# Patient Record
Sex: Female | Born: 1945 | Race: White | Hispanic: No | Marital: Married | Smoking: Never smoker
Health system: Southern US, Community
[De-identification: ages and names within clinical notes are randomized; demographics above are authoritative.]

## PROBLEM LIST (undated history)

## (undated) DIAGNOSIS — Z860100 Personal history of colon polyps, unspecified: Secondary | ICD-10-CM

## (undated) DIAGNOSIS — R001 Bradycardia, unspecified: Secondary | ICD-10-CM

## (undated) DIAGNOSIS — M109 Gout, unspecified: Secondary | ICD-10-CM

## (undated) DIAGNOSIS — E785 Hyperlipidemia, unspecified: Secondary | ICD-10-CM

## (undated) DIAGNOSIS — Z1509 Genetic susceptibility to other malignant neoplasm: Secondary | ICD-10-CM

## (undated) DIAGNOSIS — R002 Palpitations: Secondary | ICD-10-CM

## (undated) DIAGNOSIS — I059 Rheumatic mitral valve disease, unspecified: Secondary | ICD-10-CM

## (undated) DIAGNOSIS — I700 Atherosclerosis of aorta: Secondary | ICD-10-CM

## (undated) DIAGNOSIS — F419 Anxiety disorder, unspecified: Secondary | ICD-10-CM

## (undated) DIAGNOSIS — I5189 Other ill-defined heart diseases: Secondary | ICD-10-CM

## (undated) DIAGNOSIS — I340 Nonrheumatic mitral (valve) insufficiency: Secondary | ICD-10-CM

## (undated) DIAGNOSIS — R197 Diarrhea, unspecified: Secondary | ICD-10-CM

## (undated) DIAGNOSIS — I341 Nonrheumatic mitral (valve) prolapse: Secondary | ICD-10-CM

## (undated) DIAGNOSIS — C719 Malignant neoplasm of brain, unspecified: Secondary | ICD-10-CM

## (undated) DIAGNOSIS — IMO0001 Reserved for inherently not codable concepts without codable children: Secondary | ICD-10-CM

## (undated) DIAGNOSIS — Z800 Family history of malignant neoplasm of digestive organs: Secondary | ICD-10-CM

## (undated) DIAGNOSIS — I10 Essential (primary) hypertension: Secondary | ICD-10-CM

## (undated) DIAGNOSIS — R809 Proteinuria, unspecified: Secondary | ICD-10-CM

## (undated) HISTORY — DX: Rheumatic mitral valve disease, unspecified: I05.9

## (undated) HISTORY — DX: Nonrheumatic mitral (valve) prolapse: I34.1

## (undated) HISTORY — DX: Personal history of colonic polyps: Z86.010

## (undated) HISTORY — DX: Hyperlipidemia, unspecified: E78.5

## (undated) HISTORY — DX: Atherosclerosis of aorta: I70.0

## (undated) HISTORY — PX: TUBAL LIGATION: SHX77

## (undated) HISTORY — DX: Reserved for inherently not codable concepts without codable children: IMO0001

## (undated) HISTORY — DX: Palpitations: R00.2

## (undated) HISTORY — DX: Proteinuria, unspecified: R80.9

## (undated) HISTORY — PX: BREAST LUMPECTOMY: SHX2

## (undated) HISTORY — DX: Other ill-defined heart diseases: I51.89

## (undated) HISTORY — DX: Bradycardia, unspecified: R00.1

## (undated) HISTORY — DX: Diarrhea, unspecified: R19.7

## (undated) HISTORY — DX: Essential (primary) hypertension: I10

## (undated) HISTORY — DX: Anxiety disorder, unspecified: F41.9

## (undated) HISTORY — DX: Nonrheumatic mitral (valve) insufficiency: I34.0

## (undated) HISTORY — DX: Personal history of colon polyps, unspecified: Z86.0100

## (undated) HISTORY — PX: ABDOMINAL HYSTERECTOMY: SHX81

## (undated) HISTORY — DX: Family history of malignant neoplasm of digestive organs: Z80.0

---

## 1999-07-05 ENCOUNTER — Ambulatory Visit (HOSPITAL_COMMUNITY): Admission: RE | Admit: 1999-07-05 | Discharge: 1999-07-05 | Payer: Self-pay | Admitting: Gastroenterology

## 2001-02-26 ENCOUNTER — Other Ambulatory Visit: Admission: RE | Admit: 2001-02-26 | Discharge: 2001-02-26 | Payer: Self-pay | Admitting: Obstetrics and Gynecology

## 2001-10-05 ENCOUNTER — Ambulatory Visit (HOSPITAL_COMMUNITY): Admission: RE | Admit: 2001-10-05 | Discharge: 2001-10-05 | Payer: Self-pay | Admitting: *Deleted

## 2004-10-20 ENCOUNTER — Ambulatory Visit (HOSPITAL_COMMUNITY): Admission: RE | Admit: 2004-10-20 | Discharge: 2004-10-20 | Payer: Self-pay | Admitting: General Surgery

## 2005-05-13 ENCOUNTER — Ambulatory Visit: Admission: RE | Admit: 2005-05-13 | Discharge: 2005-05-13 | Payer: Self-pay | Admitting: Obstetrics and Gynecology

## 2007-08-02 HISTORY — PX: HEMORROIDECTOMY: SUR656

## 2011-01-25 ENCOUNTER — Ambulatory Visit
Admission: RE | Admit: 2011-01-25 | Discharge: 2011-01-25 | Disposition: A | Discharge status: Home / Self Care | Payer: Medicare Other | Source: Ambulatory Visit | Attending: Otolaryngology | Admitting: Otolaryngology

## 2011-01-25 ENCOUNTER — Other Ambulatory Visit: Payer: Self-pay | Admitting: Otolaryngology

## 2011-01-25 DIAGNOSIS — M542 Cervicalgia: Secondary | ICD-10-CM

## 2011-05-23 ENCOUNTER — Other Ambulatory Visit: Payer: Self-pay | Admitting: Dermatology

## 2011-08-04 ENCOUNTER — Other Ambulatory Visit: Payer: Self-pay | Admitting: Internal Medicine

## 2011-08-04 DIAGNOSIS — E785 Hyperlipidemia, unspecified: Secondary | ICD-10-CM | POA: Diagnosis not present

## 2011-08-04 DIAGNOSIS — G459 Transient cerebral ischemic attack, unspecified: Secondary | ICD-10-CM

## 2011-08-04 DIAGNOSIS — R030 Elevated blood-pressure reading, without diagnosis of hypertension: Secondary | ICD-10-CM | POA: Diagnosis not present

## 2011-08-09 ENCOUNTER — Ambulatory Visit
Admission: RE | Admit: 2011-08-09 | Discharge: 2011-08-09 | Disposition: A | Discharge status: Home / Self Care | Payer: Medicare Other | Source: Ambulatory Visit | Attending: Internal Medicine | Admitting: Internal Medicine

## 2011-08-09 ENCOUNTER — Other Ambulatory Visit: Payer: Self-pay | Admitting: Internal Medicine

## 2011-08-09 DIAGNOSIS — R51 Headache: Secondary | ICD-10-CM | POA: Diagnosis not present

## 2011-08-09 DIAGNOSIS — G459 Transient cerebral ischemic attack, unspecified: Secondary | ICD-10-CM

## 2011-08-09 DIAGNOSIS — I6529 Occlusion and stenosis of unspecified carotid artery: Secondary | ICD-10-CM | POA: Diagnosis not present

## 2011-08-09 DIAGNOSIS — R4789 Other speech disturbances: Secondary | ICD-10-CM | POA: Diagnosis not present

## 2011-08-11 DIAGNOSIS — E785 Hyperlipidemia, unspecified: Secondary | ICD-10-CM | POA: Diagnosis not present

## 2011-08-11 DIAGNOSIS — G459 Transient cerebral ischemic attack, unspecified: Secondary | ICD-10-CM | POA: Diagnosis not present

## 2011-08-18 DIAGNOSIS — G459 Transient cerebral ischemic attack, unspecified: Secondary | ICD-10-CM | POA: Diagnosis not present

## 2011-08-18 DIAGNOSIS — I10 Essential (primary) hypertension: Secondary | ICD-10-CM | POA: Diagnosis not present

## 2011-08-18 DIAGNOSIS — E785 Hyperlipidemia, unspecified: Secondary | ICD-10-CM | POA: Diagnosis not present

## 2011-09-29 DIAGNOSIS — Z23 Encounter for immunization: Secondary | ICD-10-CM | POA: Diagnosis not present

## 2011-09-29 DIAGNOSIS — E785 Hyperlipidemia, unspecified: Secondary | ICD-10-CM | POA: Diagnosis not present

## 2011-09-29 DIAGNOSIS — R030 Elevated blood-pressure reading, without diagnosis of hypertension: Secondary | ICD-10-CM | POA: Diagnosis not present

## 2011-09-29 DIAGNOSIS — M542 Cervicalgia: Secondary | ICD-10-CM | POA: Diagnosis not present

## 2011-10-24 DIAGNOSIS — M545 Low back pain: Secondary | ICD-10-CM | POA: Diagnosis not present

## 2011-10-24 DIAGNOSIS — M5430 Sciatica, unspecified side: Secondary | ICD-10-CM | POA: Diagnosis not present

## 2011-10-24 DIAGNOSIS — M5030 Other cervical disc degeneration, unspecified cervical region: Secondary | ICD-10-CM | POA: Diagnosis not present

## 2011-10-25 DIAGNOSIS — M542 Cervicalgia: Secondary | ICD-10-CM | POA: Diagnosis not present

## 2011-11-01 DIAGNOSIS — IMO0002 Reserved for concepts with insufficient information to code with codable children: Secondary | ICD-10-CM | POA: Diagnosis not present

## 2011-11-01 DIAGNOSIS — M542 Cervicalgia: Secondary | ICD-10-CM | POA: Diagnosis not present

## 2011-11-03 DIAGNOSIS — IMO0002 Reserved for concepts with insufficient information to code with codable children: Secondary | ICD-10-CM | POA: Diagnosis not present

## 2011-11-03 DIAGNOSIS — M542 Cervicalgia: Secondary | ICD-10-CM | POA: Diagnosis not present

## 2011-11-09 DIAGNOSIS — IMO0002 Reserved for concepts with insufficient information to code with codable children: Secondary | ICD-10-CM | POA: Diagnosis not present

## 2011-11-09 DIAGNOSIS — M542 Cervicalgia: Secondary | ICD-10-CM | POA: Diagnosis not present

## 2011-11-14 DIAGNOSIS — M542 Cervicalgia: Secondary | ICD-10-CM | POA: Diagnosis not present

## 2011-11-14 DIAGNOSIS — M5430 Sciatica, unspecified side: Secondary | ICD-10-CM | POA: Diagnosis not present

## 2011-11-14 DIAGNOSIS — M545 Low back pain: Secondary | ICD-10-CM | POA: Diagnosis not present

## 2011-11-14 DIAGNOSIS — M5030 Other cervical disc degeneration, unspecified cervical region: Secondary | ICD-10-CM | POA: Diagnosis not present

## 2011-11-14 DIAGNOSIS — IMO0002 Reserved for concepts with insufficient information to code with codable children: Secondary | ICD-10-CM | POA: Diagnosis not present

## 2011-11-17 DIAGNOSIS — IMO0002 Reserved for concepts with insufficient information to code with codable children: Secondary | ICD-10-CM | POA: Diagnosis not present

## 2011-11-17 DIAGNOSIS — M542 Cervicalgia: Secondary | ICD-10-CM | POA: Diagnosis not present

## 2011-12-07 ENCOUNTER — Other Ambulatory Visit: Payer: Self-pay | Admitting: Gastroenterology

## 2011-12-07 DIAGNOSIS — D126 Benign neoplasm of colon, unspecified: Secondary | ICD-10-CM | POA: Diagnosis not present

## 2011-12-07 DIAGNOSIS — Z800 Family history of malignant neoplasm of digestive organs: Secondary | ICD-10-CM | POA: Diagnosis not present

## 2011-12-07 DIAGNOSIS — Z1211 Encounter for screening for malignant neoplasm of colon: Secondary | ICD-10-CM | POA: Diagnosis not present

## 2012-02-01 DIAGNOSIS — T300 Burn of unspecified body region, unspecified degree: Secondary | ICD-10-CM | POA: Diagnosis not present

## 2012-02-01 DIAGNOSIS — L253 Unspecified contact dermatitis due to other chemical products: Secondary | ICD-10-CM | POA: Diagnosis not present

## 2012-02-24 DIAGNOSIS — H04129 Dry eye syndrome of unspecified lacrimal gland: Secondary | ICD-10-CM | POA: Diagnosis not present

## 2012-02-24 DIAGNOSIS — H18839 Recurrent erosion of cornea, unspecified eye: Secondary | ICD-10-CM | POA: Diagnosis not present

## 2012-04-20 DIAGNOSIS — Z23 Encounter for immunization: Secondary | ICD-10-CM | POA: Diagnosis not present

## 2012-05-18 DIAGNOSIS — Z1231 Encounter for screening mammogram for malignant neoplasm of breast: Secondary | ICD-10-CM | POA: Diagnosis not present

## 2012-05-23 DIAGNOSIS — C44529 Squamous cell carcinoma of skin of other part of trunk: Secondary | ICD-10-CM | POA: Diagnosis not present

## 2012-05-23 DIAGNOSIS — L819 Disorder of pigmentation, unspecified: Secondary | ICD-10-CM | POA: Diagnosis not present

## 2012-05-23 DIAGNOSIS — D1801 Hemangioma of skin and subcutaneous tissue: Secondary | ICD-10-CM | POA: Diagnosis not present

## 2012-05-23 DIAGNOSIS — L723 Sebaceous cyst: Secondary | ICD-10-CM | POA: Diagnosis not present

## 2012-05-23 DIAGNOSIS — D2370 Other benign neoplasm of skin of unspecified lower limb, including hip: Secondary | ICD-10-CM | POA: Diagnosis not present

## 2012-05-23 DIAGNOSIS — D235 Other benign neoplasm of skin of trunk: Secondary | ICD-10-CM | POA: Diagnosis not present

## 2012-05-23 DIAGNOSIS — L919 Hypertrophic disorder of the skin, unspecified: Secondary | ICD-10-CM | POA: Diagnosis not present

## 2012-05-23 DIAGNOSIS — L821 Other seborrheic keratosis: Secondary | ICD-10-CM | POA: Diagnosis not present

## 2012-05-23 DIAGNOSIS — D485 Neoplasm of uncertain behavior of skin: Secondary | ICD-10-CM | POA: Diagnosis not present

## 2012-05-23 DIAGNOSIS — D239 Other benign neoplasm of skin, unspecified: Secondary | ICD-10-CM | POA: Diagnosis not present

## 2012-05-23 DIAGNOSIS — L909 Atrophic disorder of skin, unspecified: Secondary | ICD-10-CM | POA: Diagnosis not present

## 2012-05-29 DIAGNOSIS — H18839 Recurrent erosion of cornea, unspecified eye: Secondary | ICD-10-CM | POA: Diagnosis not present

## 2012-05-29 DIAGNOSIS — H04129 Dry eye syndrome of unspecified lacrimal gland: Secondary | ICD-10-CM | POA: Diagnosis not present

## 2012-06-07 ENCOUNTER — Other Ambulatory Visit: Payer: Self-pay | Admitting: Dermatology

## 2012-06-07 DIAGNOSIS — C44529 Squamous cell carcinoma of skin of other part of trunk: Secondary | ICD-10-CM | POA: Diagnosis not present

## 2012-08-06 DIAGNOSIS — R030 Elevated blood-pressure reading, without diagnosis of hypertension: Secondary | ICD-10-CM | POA: Diagnosis not present

## 2012-08-06 DIAGNOSIS — K219 Gastro-esophageal reflux disease without esophagitis: Secondary | ICD-10-CM | POA: Diagnosis not present

## 2012-08-06 DIAGNOSIS — E785 Hyperlipidemia, unspecified: Secondary | ICD-10-CM | POA: Diagnosis not present

## 2012-08-06 DIAGNOSIS — R5383 Other fatigue: Secondary | ICD-10-CM | POA: Diagnosis not present

## 2012-08-06 DIAGNOSIS — R5381 Other malaise: Secondary | ICD-10-CM | POA: Diagnosis not present

## 2012-08-13 DIAGNOSIS — M25539 Pain in unspecified wrist: Secondary | ICD-10-CM | POA: Diagnosis not present

## 2012-08-17 DIAGNOSIS — J309 Allergic rhinitis, unspecified: Secondary | ICD-10-CM | POA: Diagnosis not present

## 2012-08-17 DIAGNOSIS — J370 Chronic laryngitis: Secondary | ICD-10-CM | POA: Diagnosis not present

## 2012-09-03 DIAGNOSIS — M25539 Pain in unspecified wrist: Secondary | ICD-10-CM | POA: Diagnosis not present

## 2013-02-11 DIAGNOSIS — M25579 Pain in unspecified ankle and joints of unspecified foot: Secondary | ICD-10-CM | POA: Diagnosis not present

## 2013-02-26 DIAGNOSIS — R5381 Other malaise: Secondary | ICD-10-CM | POA: Diagnosis not present

## 2013-02-26 DIAGNOSIS — R5383 Other fatigue: Secondary | ICD-10-CM | POA: Diagnosis not present

## 2013-03-11 DIAGNOSIS — L659 Nonscarring hair loss, unspecified: Secondary | ICD-10-CM | POA: Diagnosis not present

## 2013-03-11 DIAGNOSIS — R5383 Other fatigue: Secondary | ICD-10-CM | POA: Diagnosis not present

## 2013-03-11 DIAGNOSIS — M129 Arthropathy, unspecified: Secondary | ICD-10-CM | POA: Diagnosis not present

## 2013-03-11 DIAGNOSIS — R5381 Other malaise: Secondary | ICD-10-CM | POA: Diagnosis not present

## 2013-03-11 DIAGNOSIS — E669 Obesity, unspecified: Secondary | ICD-10-CM | POA: Diagnosis not present

## 2013-03-11 DIAGNOSIS — Z8349 Family history of other endocrine, nutritional and metabolic diseases: Secondary | ICD-10-CM | POA: Diagnosis not present

## 2013-03-11 DIAGNOSIS — R635 Abnormal weight gain: Secondary | ICD-10-CM | POA: Diagnosis not present

## 2013-03-14 DIAGNOSIS — R635 Abnormal weight gain: Secondary | ICD-10-CM | POA: Diagnosis not present

## 2013-04-30 DIAGNOSIS — Z23 Encounter for immunization: Secondary | ICD-10-CM | POA: Diagnosis not present

## 2013-05-20 DIAGNOSIS — Z1231 Encounter for screening mammogram for malignant neoplasm of breast: Secondary | ICD-10-CM | POA: Diagnosis not present

## 2013-05-22 DIAGNOSIS — M25539 Pain in unspecified wrist: Secondary | ICD-10-CM | POA: Diagnosis not present

## 2013-05-22 DIAGNOSIS — M25579 Pain in unspecified ankle and joints of unspecified foot: Secondary | ICD-10-CM | POA: Diagnosis not present

## 2013-05-28 DIAGNOSIS — Z8349 Family history of other endocrine, nutritional and metabolic diseases: Secondary | ICD-10-CM | POA: Diagnosis not present

## 2013-05-28 DIAGNOSIS — E669 Obesity, unspecified: Secondary | ICD-10-CM | POA: Diagnosis not present

## 2013-05-28 DIAGNOSIS — R635 Abnormal weight gain: Secondary | ICD-10-CM | POA: Diagnosis not present

## 2013-06-03 DIAGNOSIS — H52209 Unspecified astigmatism, unspecified eye: Secondary | ICD-10-CM | POA: Diagnosis not present

## 2013-06-03 DIAGNOSIS — H18839 Recurrent erosion of cornea, unspecified eye: Secondary | ICD-10-CM | POA: Diagnosis not present

## 2013-06-03 DIAGNOSIS — H35419 Lattice degeneration of retina, unspecified eye: Secondary | ICD-10-CM | POA: Diagnosis not present

## 2013-06-03 DIAGNOSIS — H2510 Age-related nuclear cataract, unspecified eye: Secondary | ICD-10-CM | POA: Diagnosis not present

## 2013-06-10 ENCOUNTER — Ambulatory Visit (INDEPENDENT_AMBULATORY_CARE_PROVIDER_SITE_OTHER): Payer: Medicare Other

## 2013-06-10 ENCOUNTER — Encounter: Payer: Self-pay | Admitting: Podiatry

## 2013-06-10 ENCOUNTER — Other Ambulatory Visit: Payer: Self-pay | Admitting: Podiatry

## 2013-06-10 ENCOUNTER — Ambulatory Visit (INDEPENDENT_AMBULATORY_CARE_PROVIDER_SITE_OTHER): Payer: Medicare Other | Admitting: Podiatry

## 2013-06-10 VITALS — BP 147/68 | HR 76 | Resp 16 | Ht 63.0 in | Wt 175.0 lb

## 2013-06-10 DIAGNOSIS — M79609 Pain in unspecified limb: Secondary | ICD-10-CM

## 2013-06-10 DIAGNOSIS — M779 Enthesopathy, unspecified: Secondary | ICD-10-CM | POA: Diagnosis not present

## 2013-06-10 DIAGNOSIS — M109 Gout, unspecified: Secondary | ICD-10-CM | POA: Diagnosis not present

## 2013-06-10 LAB — CBC WITH DIFFERENTIAL/PLATELET
Hemoglobin: 12.6 g/dL (ref 12.0–15.0)
Lymphocytes Relative: 22 % (ref 12–46)
Lymphs Abs: 2.1 10*3/uL (ref 0.7–4.0)
Monocytes Relative: 10 % (ref 3–12)
Neutro Abs: 6.4 10*3/uL (ref 1.7–7.7)
Neutrophils Relative %: 63 % (ref 43–77)
Platelets: 295 10*3/uL (ref 150–400)
RBC: 4.11 MIL/uL (ref 3.87–5.11)
WBC: 9.9 10*3/uL (ref 4.0–10.5)

## 2013-06-10 LAB — SEDIMENTATION RATE: Sed Rate: 40 mm/hr — ABNORMAL HIGH (ref 0–22)

## 2013-06-10 MED ORDER — COLCHICINE 0.6 MG PO TABS: 0.6000 mg | ORAL_TABLET | Freq: Every day | ORAL | Status: DC

## 2013-06-10 MED ORDER — HYDROCODONE-ACETAMINOPHEN 10-325 MG PO TABS: 1.0000 | ORAL_TABLET | Freq: Three times a day (TID) | ORAL | Status: DC | PRN

## 2013-06-10 NOTE — Patient Instructions (Signed)
Stop indomethacin and ibuprofen and use new medication. Go to [facility] lab today

## 2013-06-10 NOTE — Progress Notes (Signed)
  Subjective:    Patient ID: Crystal Parsons, female    DOB: 1945-08-28, 67 y.o.   MRN: [id number] "My left foot is swollen and red.  Feels like there's a pad underneath it."  Foot Pain This is a new problem. The current episode started more than 1 month ago. The problem occurs constantly. The problem has been gradually worsening. Associated symptoms include joint swelling. The symptoms are aggravated by standing and walking. She has tried NSAIDs and heat for the symptoms. The treatment provided mild relief.   She is just indomethacin 50 mg 3 times a day for approximately 3 weeks with minimal reduction of symptoms in the left foot with improving symptoms in the right foot. Today she took 1000 mg of ibuprofen for pain relief.   Review of Systems  Constitutional: Negative.   HENT: Negative.   Cardiovascular: Negative.   Gastrointestinal: Negative.   Endocrine: Negative.   Genitourinary: Negative.   Musculoskeletal: Positive for gait problem and joint swelling.  Skin: Negative.   Allergic/Immunologic: Negative.   Neurological: Negative.   Hematological: Negative.        Objective:   Physical Exam  Orientated x75 67 year old white female  Vascular: DP and PT pulses are two over four bilaterally.  Neurological: Knee and ankle reflexes are equal and reactive bilaterally  Dermatological low-grade edema and erythema noted in the left foot. Erythema seems to be localized to the medial left heel area and first MPJ area.  Musculoskeletal: No deformities noted. Tenderness to palpation medial first MPJ left without a palpable lesions.        Assessment & Plan:   Assessment: Suspect gouty arthritis left foot  Plan: Patient is referred to lab for CBC sedimentation rate C-reactive protein and rheumatoid factor an ASO titer. Rx colchicine 0.6 mg #30 take 1 daily. Hydrocodone 10 mg/325 #20 take one every 6 hours as needed for pain. DC indomethacin.  Reappoint x7 days  Audreena Sachdeva  C.Leeanne Deed, DPM

## 2013-06-11 LAB — URIC ACID: Uric Acid, Serum: 5.8 mg/dL (ref 2.4–7.0)

## 2013-06-11 LAB — ANA: Anti Nuclear Antibody(ANA): NEGATIVE

## 2013-06-11 LAB — RHEUMATOID FACTOR: Rhuematoid fact SerPl-aCnc: <10 IU/mL (ref <=14)

## 2013-06-17 ENCOUNTER — Ambulatory Visit (INDEPENDENT_AMBULATORY_CARE_PROVIDER_SITE_OTHER): Payer: Medicare Other | Admitting: Podiatry

## 2013-06-17 ENCOUNTER — Encounter: Payer: Self-pay | Admitting: Podiatry

## 2013-06-17 VITALS — BP 144/78 | HR 76 | Resp 16

## 2013-06-17 DIAGNOSIS — M779 Enthesopathy, unspecified: Secondary | ICD-10-CM

## 2013-06-17 NOTE — Progress Notes (Signed)
Patient ID: MAELEE HOOT, female   DOB: [birth year], 67 y.o.   MRN: [id number]  Subjective: Patient presents for followup care for painful swollen left great toe joint. On the initial visit coexisting 68 mg was prescribed daily. Since she started this medicine the pain and swelling has significantly reduced. In addition she complains of some intermittent leg cramping and has discontinued her Crestor at the recommendation of her pharmacist. The leg spasms and pain still seem to occur somewhat intermittently.  Objective: 67 year old white female orientated x3. The left foot demonstrates mild edema no erythema no warmth. The left first MPJ is not tender to motion or palpation. Lab results of arthritis for a panel demonstrated a normal serum uric acid, slightly elevated C-reactive protein and slightly elevated sedimentation rate. The ASO titer was negative.  Assessment: Probable gouty arthritis left first MPJ  Plan: Patient will continue taking her colchicine one daily until pain is resolved. She will then restart her Crestor and see if her leg symptoms exacerbate. Return at patient's request. If symptoms would recur and could not lab prove that she definitely has gout I would consider referring her to a rheumatologist.

## 2013-06-17 NOTE — Patient Instructions (Signed)
Continue to take 1 Colyrs daily until foot pain resolves. Consult with your primary physician before you restart the Crestor. Tester gout was within normal limits. Rheumatoid arthritis was within normal limits. The chest for ongoing indices was within normal limits. There was mild elevation of the sedimentation rate and C. reactive protein which are nonspecific has for inflammation. Reappoint if needed.

## 2013-06-24 ENCOUNTER — Ambulatory Visit: Payer: Self-pay | Admitting: Podiatry

## 2013-07-29 ENCOUNTER — Other Ambulatory Visit: Payer: Self-pay | Admitting: Dermatology

## 2013-07-29 DIAGNOSIS — D2360 Other benign neoplasm of skin of unspecified upper limb, including shoulder: Secondary | ICD-10-CM | POA: Diagnosis not present

## 2013-07-29 DIAGNOSIS — L821 Other seborrheic keratosis: Secondary | ICD-10-CM | POA: Diagnosis not present

## 2013-07-29 DIAGNOSIS — D239 Other benign neoplasm of skin, unspecified: Secondary | ICD-10-CM | POA: Diagnosis not present

## 2013-07-29 DIAGNOSIS — L739 Follicular disorder, unspecified: Secondary | ICD-10-CM | POA: Diagnosis not present

## 2013-07-29 DIAGNOSIS — Z85828 Personal history of other malignant neoplasm of skin: Secondary | ICD-10-CM | POA: Diagnosis not present

## 2013-07-29 DIAGNOSIS — D485 Neoplasm of uncertain behavior of skin: Secondary | ICD-10-CM | POA: Diagnosis not present

## 2013-07-29 DIAGNOSIS — L570 Actinic keratosis: Secondary | ICD-10-CM | POA: Diagnosis not present

## 2013-07-29 DIAGNOSIS — L909 Atrophic disorder of skin, unspecified: Secondary | ICD-10-CM | POA: Diagnosis not present

## 2013-08-08 DIAGNOSIS — F411 Generalized anxiety disorder: Secondary | ICD-10-CM | POA: Diagnosis not present

## 2013-08-15 DIAGNOSIS — F411 Generalized anxiety disorder: Secondary | ICD-10-CM | POA: Diagnosis not present

## 2013-08-22 DIAGNOSIS — R209 Unspecified disturbances of skin sensation: Secondary | ICD-10-CM | POA: Diagnosis not present

## 2013-08-22 DIAGNOSIS — I10 Essential (primary) hypertension: Secondary | ICD-10-CM | POA: Diagnosis not present

## 2013-08-22 DIAGNOSIS — E785 Hyperlipidemia, unspecified: Secondary | ICD-10-CM | POA: Diagnosis not present

## 2013-08-22 DIAGNOSIS — M19079 Primary osteoarthritis, unspecified ankle and foot: Secondary | ICD-10-CM | POA: Diagnosis not present

## 2013-08-27 DIAGNOSIS — F411 Generalized anxiety disorder: Secondary | ICD-10-CM | POA: Diagnosis not present

## 2013-09-03 DIAGNOSIS — F411 Generalized anxiety disorder: Secondary | ICD-10-CM | POA: Diagnosis not present

## 2013-09-10 DIAGNOSIS — F411 Generalized anxiety disorder: Secondary | ICD-10-CM | POA: Diagnosis not present

## 2013-09-18 DIAGNOSIS — F411 Generalized anxiety disorder: Secondary | ICD-10-CM | POA: Diagnosis not present

## 2013-09-19 DIAGNOSIS — I10 Essential (primary) hypertension: Secondary | ICD-10-CM | POA: Diagnosis not present

## 2013-09-19 DIAGNOSIS — J310 Chronic rhinitis: Secondary | ICD-10-CM | POA: Diagnosis not present

## 2013-09-19 DIAGNOSIS — F411 Generalized anxiety disorder: Secondary | ICD-10-CM | POA: Diagnosis not present

## 2013-09-19 DIAGNOSIS — E785 Hyperlipidemia, unspecified: Secondary | ICD-10-CM | POA: Diagnosis not present

## 2013-09-24 DIAGNOSIS — F411 Generalized anxiety disorder: Secondary | ICD-10-CM | POA: Diagnosis not present

## 2013-10-01 DIAGNOSIS — F411 Generalized anxiety disorder: Secondary | ICD-10-CM | POA: Diagnosis not present

## 2013-10-22 DIAGNOSIS — F411 Generalized anxiety disorder: Secondary | ICD-10-CM | POA: Diagnosis not present

## 2013-10-22 DIAGNOSIS — R05 Cough: Secondary | ICD-10-CM | POA: Diagnosis not present

## 2013-10-22 DIAGNOSIS — R059 Cough, unspecified: Secondary | ICD-10-CM | POA: Diagnosis not present

## 2013-10-22 DIAGNOSIS — I10 Essential (primary) hypertension: Secondary | ICD-10-CM | POA: Diagnosis not present

## 2013-10-29 DIAGNOSIS — F411 Generalized anxiety disorder: Secondary | ICD-10-CM | POA: Diagnosis not present

## 2013-11-05 DIAGNOSIS — F411 Generalized anxiety disorder: Secondary | ICD-10-CM | POA: Diagnosis not present

## 2013-11-18 DIAGNOSIS — F411 Generalized anxiety disorder: Secondary | ICD-10-CM | POA: Diagnosis not present

## 2013-11-25 DIAGNOSIS — F411 Generalized anxiety disorder: Secondary | ICD-10-CM | POA: Diagnosis not present

## 2013-11-28 DIAGNOSIS — R059 Cough, unspecified: Secondary | ICD-10-CM | POA: Diagnosis not present

## 2013-11-28 DIAGNOSIS — R05 Cough: Secondary | ICD-10-CM | POA: Diagnosis not present

## 2013-12-03 DIAGNOSIS — F411 Generalized anxiety disorder: Secondary | ICD-10-CM | POA: Diagnosis not present

## 2013-12-16 DIAGNOSIS — F411 Generalized anxiety disorder: Secondary | ICD-10-CM | POA: Diagnosis not present

## 2014-01-01 DIAGNOSIS — F411 Generalized anxiety disorder: Secondary | ICD-10-CM | POA: Diagnosis not present

## 2014-01-15 DIAGNOSIS — F411 Generalized anxiety disorder: Secondary | ICD-10-CM | POA: Diagnosis not present

## 2014-01-16 DIAGNOSIS — Z800 Family history of malignant neoplasm of digestive organs: Secondary | ICD-10-CM | POA: Diagnosis not present

## 2014-01-16 DIAGNOSIS — Z1211 Encounter for screening for malignant neoplasm of colon: Secondary | ICD-10-CM | POA: Diagnosis not present

## 2014-01-16 DIAGNOSIS — K5730 Diverticulosis of large intestine without perforation or abscess without bleeding: Secondary | ICD-10-CM | POA: Diagnosis not present

## 2014-01-22 ENCOUNTER — Encounter: Payer: Self-pay | Admitting: *Deleted

## 2014-02-05 DIAGNOSIS — F411 Generalized anxiety disorder: Secondary | ICD-10-CM | POA: Diagnosis not present

## 2014-02-07 DIAGNOSIS — R300 Dysuria: Secondary | ICD-10-CM | POA: Diagnosis not present

## 2014-02-20 DIAGNOSIS — R3129 Other microscopic hematuria: Secondary | ICD-10-CM | POA: Diagnosis not present

## 2014-02-20 DIAGNOSIS — Z0000 Encounter for general adult medical examination without abnormal findings: Secondary | ICD-10-CM | POA: Diagnosis not present

## 2014-02-20 DIAGNOSIS — Z1331 Encounter for screening for depression: Secondary | ICD-10-CM | POA: Diagnosis not present

## 2014-02-20 DIAGNOSIS — Z23 Encounter for immunization: Secondary | ICD-10-CM | POA: Diagnosis not present

## 2014-02-20 DIAGNOSIS — I10 Essential (primary) hypertension: Secondary | ICD-10-CM | POA: Diagnosis not present

## 2014-02-24 DIAGNOSIS — F411 Generalized anxiety disorder: Secondary | ICD-10-CM | POA: Diagnosis not present

## 2014-03-13 DIAGNOSIS — F411 Generalized anxiety disorder: Secondary | ICD-10-CM | POA: Diagnosis not present

## 2014-04-10 DIAGNOSIS — F411 Generalized anxiety disorder: Secondary | ICD-10-CM | POA: Diagnosis not present

## 2014-04-29 DIAGNOSIS — Z23 Encounter for immunization: Secondary | ICD-10-CM | POA: Diagnosis not present

## 2014-05-08 DIAGNOSIS — F411 Generalized anxiety disorder: Secondary | ICD-10-CM | POA: Diagnosis not present

## 2014-05-14 DIAGNOSIS — M79671 Pain in right foot: Secondary | ICD-10-CM | POA: Diagnosis not present

## 2014-05-21 DIAGNOSIS — Z1231 Encounter for screening mammogram for malignant neoplasm of breast: Secondary | ICD-10-CM | POA: Diagnosis not present

## 2014-05-22 DIAGNOSIS — R921 Mammographic calcification found on diagnostic imaging of breast: Secondary | ICD-10-CM | POA: Diagnosis not present

## 2014-06-04 DIAGNOSIS — H5203 Hypermetropia, bilateral: Secondary | ICD-10-CM | POA: Diagnosis not present

## 2014-06-04 DIAGNOSIS — D3131 Benign neoplasm of right choroid: Secondary | ICD-10-CM | POA: Diagnosis not present

## 2014-06-04 DIAGNOSIS — H35413 Lattice degeneration of retina, bilateral: Secondary | ICD-10-CM | POA: Diagnosis not present

## 2014-07-09 DIAGNOSIS — F411 Generalized anxiety disorder: Secondary | ICD-10-CM | POA: Diagnosis not present

## 2014-09-03 DIAGNOSIS — R312 Other microscopic hematuria: Secondary | ICD-10-CM | POA: Diagnosis not present

## 2014-09-03 DIAGNOSIS — E669 Obesity, unspecified: Secondary | ICD-10-CM | POA: Diagnosis not present

## 2014-09-03 DIAGNOSIS — I10 Essential (primary) hypertension: Secondary | ICD-10-CM | POA: Diagnosis not present

## 2014-09-03 DIAGNOSIS — N200 Calculus of kidney: Secondary | ICD-10-CM | POA: Diagnosis not present

## 2014-09-03 DIAGNOSIS — Z6832 Body mass index (BMI) 32.0-32.9, adult: Secondary | ICD-10-CM | POA: Diagnosis not present

## 2014-09-03 DIAGNOSIS — E782 Mixed hyperlipidemia: Secondary | ICD-10-CM | POA: Diagnosis not present

## 2014-09-19 DIAGNOSIS — L218 Other seborrheic dermatitis: Secondary | ICD-10-CM | POA: Diagnosis not present

## 2014-09-19 DIAGNOSIS — L309 Dermatitis, unspecified: Secondary | ICD-10-CM | POA: Diagnosis not present

## 2014-09-19 DIAGNOSIS — L821 Other seborrheic keratosis: Secondary | ICD-10-CM | POA: Diagnosis not present

## 2014-09-19 DIAGNOSIS — Z85828 Personal history of other malignant neoplasm of skin: Secondary | ICD-10-CM | POA: Diagnosis not present

## 2014-09-19 DIAGNOSIS — D225 Melanocytic nevi of trunk: Secondary | ICD-10-CM | POA: Diagnosis not present

## 2014-09-19 DIAGNOSIS — D2239 Melanocytic nevi of other parts of face: Secondary | ICD-10-CM | POA: Diagnosis not present

## 2014-09-30 DIAGNOSIS — R300 Dysuria: Secondary | ICD-10-CM | POA: Diagnosis not present

## 2014-10-20 DIAGNOSIS — M25561 Pain in right knee: Secondary | ICD-10-CM | POA: Diagnosis not present

## 2014-11-17 DIAGNOSIS — M25561 Pain in right knee: Secondary | ICD-10-CM | POA: Diagnosis not present

## 2014-11-18 DIAGNOSIS — F411 Generalized anxiety disorder: Secondary | ICD-10-CM | POA: Diagnosis not present

## 2014-11-19 ENCOUNTER — Other Ambulatory Visit: Payer: Self-pay | Admitting: Orthopaedic Surgery

## 2014-11-19 DIAGNOSIS — M25561 Pain in right knee: Secondary | ICD-10-CM

## 2014-12-02 ENCOUNTER — Ambulatory Visit
Admission: RE | Admit: 2014-12-02 | Discharge: 2014-12-02 | Disposition: A | Discharge status: Home / Self Care | Payer: Medicare Other | Source: Ambulatory Visit | Attending: Orthopaedic Surgery | Admitting: Orthopaedic Surgery

## 2014-12-02 DIAGNOSIS — M25561 Pain in right knee: Secondary | ICD-10-CM

## 2014-12-02 DIAGNOSIS — M25461 Effusion, right knee: Secondary | ICD-10-CM | POA: Diagnosis not present

## 2014-12-04 ENCOUNTER — Other Ambulatory Visit: Payer: Medicare Other

## 2014-12-15 DIAGNOSIS — M1711 Unilateral primary osteoarthritis, right knee: Secondary | ICD-10-CM | POA: Diagnosis not present

## 2014-12-16 ENCOUNTER — Other Ambulatory Visit: Payer: Medicare Other

## 2014-12-25 DIAGNOSIS — M1711 Unilateral primary osteoarthritis, right knee: Secondary | ICD-10-CM | POA: Diagnosis not present

## 2014-12-30 DIAGNOSIS — H25013 Cortical age-related cataract, bilateral: Secondary | ICD-10-CM | POA: Diagnosis not present

## 2014-12-30 DIAGNOSIS — H35413 Lattice degeneration of retina, bilateral: Secondary | ICD-10-CM | POA: Diagnosis not present

## 2014-12-30 DIAGNOSIS — H35371 Puckering of macula, right eye: Secondary | ICD-10-CM | POA: Diagnosis not present

## 2015-01-28 DIAGNOSIS — F411 Generalized anxiety disorder: Secondary | ICD-10-CM | POA: Diagnosis not present

## 2015-02-04 DIAGNOSIS — L920 Granuloma annulare: Secondary | ICD-10-CM | POA: Diagnosis not present

## 2015-02-04 DIAGNOSIS — Z85828 Personal history of other malignant neoplasm of skin: Secondary | ICD-10-CM | POA: Diagnosis not present

## 2015-02-18 DIAGNOSIS — F411 Generalized anxiety disorder: Secondary | ICD-10-CM | POA: Diagnosis not present

## 2015-02-19 DIAGNOSIS — F411 Generalized anxiety disorder: Secondary | ICD-10-CM | POA: Diagnosis not present

## 2015-03-02 DIAGNOSIS — F411 Generalized anxiety disorder: Secondary | ICD-10-CM | POA: Diagnosis not present

## 2015-03-11 DIAGNOSIS — E669 Obesity, unspecified: Secondary | ICD-10-CM | POA: Diagnosis not present

## 2015-03-11 DIAGNOSIS — E782 Mixed hyperlipidemia: Secondary | ICD-10-CM | POA: Diagnosis not present

## 2015-03-11 DIAGNOSIS — I10 Essential (primary) hypertension: Secondary | ICD-10-CM | POA: Diagnosis not present

## 2015-03-11 DIAGNOSIS — Z6832 Body mass index (BMI) 32.0-32.9, adult: Secondary | ICD-10-CM | POA: Diagnosis not present

## 2015-03-11 DIAGNOSIS — Z1389 Encounter for screening for other disorder: Secondary | ICD-10-CM | POA: Diagnosis not present

## 2015-03-31 DIAGNOSIS — F411 Generalized anxiety disorder: Secondary | ICD-10-CM | POA: Diagnosis not present

## 2015-04-15 DIAGNOSIS — Z85828 Personal history of other malignant neoplasm of skin: Secondary | ICD-10-CM | POA: Diagnosis not present

## 2015-04-15 DIAGNOSIS — L920 Granuloma annulare: Secondary | ICD-10-CM | POA: Diagnosis not present

## 2015-04-22 DIAGNOSIS — F411 Generalized anxiety disorder: Secondary | ICD-10-CM | POA: Diagnosis not present

## 2015-05-01 DIAGNOSIS — Z23 Encounter for immunization: Secondary | ICD-10-CM | POA: Diagnosis not present

## 2015-05-26 DIAGNOSIS — F411 Generalized anxiety disorder: Secondary | ICD-10-CM | POA: Diagnosis not present

## 2015-06-08 DIAGNOSIS — M25571 Pain in right ankle and joints of right foot: Secondary | ICD-10-CM | POA: Diagnosis not present

## 2015-06-08 DIAGNOSIS — M25671 Stiffness of right ankle, not elsewhere classified: Secondary | ICD-10-CM | POA: Diagnosis not present

## 2015-06-08 DIAGNOSIS — M25562 Pain in left knee: Secondary | ICD-10-CM | POA: Diagnosis not present

## 2015-06-29 DIAGNOSIS — F411 Generalized anxiety disorder: Secondary | ICD-10-CM | POA: Diagnosis not present

## 2015-07-02 DIAGNOSIS — Z1231 Encounter for screening mammogram for malignant neoplasm of breast: Secondary | ICD-10-CM | POA: Diagnosis not present

## 2015-07-09 DIAGNOSIS — F411 Generalized anxiety disorder: Secondary | ICD-10-CM | POA: Diagnosis not present

## 2015-07-21 DIAGNOSIS — F411 Generalized anxiety disorder: Secondary | ICD-10-CM | POA: Diagnosis not present

## 2015-08-05 DIAGNOSIS — R300 Dysuria: Secondary | ICD-10-CM | POA: Diagnosis not present

## 2015-09-11 DIAGNOSIS — L659 Nonscarring hair loss, unspecified: Secondary | ICD-10-CM | POA: Diagnosis not present

## 2015-09-11 DIAGNOSIS — M7700 Medial epicondylitis, unspecified elbow: Secondary | ICD-10-CM | POA: Diagnosis not present

## 2015-09-11 DIAGNOSIS — R0981 Nasal congestion: Secondary | ICD-10-CM | POA: Diagnosis not present

## 2015-09-11 DIAGNOSIS — E669 Obesity, unspecified: Secondary | ICD-10-CM | POA: Diagnosis not present

## 2015-09-11 DIAGNOSIS — I10 Essential (primary) hypertension: Secondary | ICD-10-CM | POA: Diagnosis not present

## 2015-09-11 DIAGNOSIS — Z6833 Body mass index (BMI) 33.0-33.9, adult: Secondary | ICD-10-CM | POA: Diagnosis not present

## 2016-01-10 DIAGNOSIS — R05 Cough: Secondary | ICD-10-CM | POA: Diagnosis not present

## 2016-01-11 DIAGNOSIS — H35371 Puckering of macula, right eye: Secondary | ICD-10-CM | POA: Diagnosis not present

## 2016-01-11 DIAGNOSIS — H2513 Age-related nuclear cataract, bilateral: Secondary | ICD-10-CM | POA: Diagnosis not present

## 2016-01-11 DIAGNOSIS — H5203 Hypermetropia, bilateral: Secondary | ICD-10-CM | POA: Diagnosis not present

## 2016-01-19 ENCOUNTER — Telehealth: Payer: Self-pay | Admitting: *Deleted

## 2016-01-19 NOTE — Telephone Encounter (Signed)
Pt called for colcrys refill. Pt has not been seen in office since 06/2013.  Dr. Amalia Hailey states pt needs to be seen.  I informed pt and transferred to schedulers.

## 2016-01-20 ENCOUNTER — Ambulatory Visit (INDEPENDENT_AMBULATORY_CARE_PROVIDER_SITE_OTHER): Payer: Medicare Other

## 2016-01-20 ENCOUNTER — Ambulatory Visit (INDEPENDENT_AMBULATORY_CARE_PROVIDER_SITE_OTHER): Payer: Medicare Other | Admitting: Podiatry

## 2016-01-20 ENCOUNTER — Encounter: Payer: Self-pay | Admitting: Podiatry

## 2016-01-20 VITALS — BP 116/65 | HR 65 | Temp 99.0°F | Resp 12

## 2016-01-20 DIAGNOSIS — M109 Gout, unspecified: Secondary | ICD-10-CM | POA: Diagnosis not present

## 2016-01-20 DIAGNOSIS — M79609 Pain in unspecified limb: Secondary | ICD-10-CM | POA: Diagnosis not present

## 2016-01-20 DIAGNOSIS — M1000 Idiopathic gout, unspecified site: Secondary | ICD-10-CM

## 2016-01-20 DIAGNOSIS — M79672 Pain in left foot: Secondary | ICD-10-CM

## 2016-01-20 MED ORDER — CEPHALEXIN 500 MG PO CAPS: 500.0000 mg | ORAL_CAPSULE | Freq: Three times a day (TID) | ORAL | Status: DC

## 2016-01-20 MED ORDER — COLCHICINE 0.6 MG PO TABS: 0.6000 mg | ORAL_TABLET | Freq: Every day | ORAL | Status: DC

## 2016-01-20 MED ORDER — HYDROCODONE-ACETAMINOPHEN 10-325 MG PO TABS: 1.0000 | ORAL_TABLET | Freq: Three times a day (TID) | ORAL | Status: DC | PRN

## 2016-01-20 NOTE — Progress Notes (Signed)
   Subjective:    Patient ID: Crystal Parsons, female    DOB: [birth year], 70 y.o.   MRN: [id number]  HPI    This patient presents today with an acute onset of swelling, pain, burning sensation in the left foot onset 01/16/2016. She denies the symptoms began again gradually increasing in discomfort and swelling over time. The left foot is painful with weightbearing and walking the pain reducing with rest and elevation. Patient is had some few doses of hydrocodone from previous painful episodes and has used hydrocodone to control the pain. Patient recalls a previous sudden onset of pain in her left great toe joint occurring in 2014 and at that time colchicine was prescribed in the symptoms resolve with this medication. Lab confirmation that time did not confirm elevation of the uric acid. Patient denies any acute episodes of pain and swelling until this episode of 01/16/2016   Review of Systems  Cardiovascular: Positive for leg swelling.  Skin: Positive for color change.       Objective:   Physical Exam  Orientated 3  BP 116/65 Pulse 65 Temperature 99 F  Vascular: DP pulses 2/4 bilaterally PT pulses 2/4 bilaterally In reflex immediate bilaterally Capillary reflex immediate bilaterally  Neurological: Sensation to 10 g monofilament wire intact 4/5 right 3/5 left Vibratory sensation reactive bilaterally Ankle reflex equal and reactive bilaterally  Dermatological: No open skin lesions bilaterally Local edema dorsal left foot with warmth without any erythema Edematous, erythematous second left toe with warmth General tenderness to palpation dorsal midfoot left and second left toe  Musculoskeletal: Painful gait HAV left  X-ray examination weightbearing left foot dated 01/20/2016  Intact bony structure without fracture and/or dislocation HAV Bunionette Posterior and inferior heel spurs  Radiographic impression: No acute bony abnormality noted left foot dated  01/20/2016       Assessment & Plan:   Assessment: Inflammatory arthritis, possible gouty arthritis versus cellulitis left foot  Plan: At this time I advised patient that I could not give her a definitive diagnosis and would treat the symptoms as possible gout or cellulitis. Patient referred to the lab for CBC with differential an arthritis profile  Rx Colchicine 0.6 mg dispensed 30 sig 1 daily Cephalexin 500 mg dispensed 21 sig 1 by mouth 3 times a day Hydrocodone 10/325 dispensed 20 sig 1 by mouth every 6-8 hours when necessary pain  Reappoint 7 days

## 2016-01-20 NOTE — Patient Instructions (Signed)
We are referring you to the lab for an arthritis screening panel Begin taking one colchicine 0.6 mg At one daily until pain ends Begin taking oral antibiotiic cephalexin 500 mg 1 capsule 3 times a day 7 days Use your pain medication hydrocodone 10325 one every 6-8 hours as needed

## 2016-01-21 LAB — CBC WITH DIFFERENTIAL/PLATELET
BASOS PCT: 0 %
Basophils Absolute: 0 cells/uL (ref 0–200)
EOS PCT: 0 %
Eosinophils Absolute: 0 cells/uL — ABNORMAL LOW (ref 15–500)
HCT: 38.3 % (ref 35.0–45.0)
HEMOGLOBIN: 13.1 g/dL (ref 11.7–15.5)
LYMPHS ABS: 1875 {cells}/uL (ref 850–3900)
Lymphocytes Relative: 15 %
MCH: 30.5 pg (ref 27.0–33.0)
MCHC: 34.2 g/dL (ref 32.0–36.0)
MCV: 89.3 fL (ref 80.0–100.0)
MONOS PCT: 7 %
MPV: 9.6 fL (ref 7.5–12.5)
Monocytes Absolute: 875 cells/uL (ref 200–950)
NEUTROS ABS: 9750 {cells}/uL — AB (ref 1500–7800)
Neutrophils Relative %: 78 %
Platelets: 349 10*3/uL (ref 140–400)
RBC: 4.29 MIL/uL (ref 3.80–5.10)
RDW: 13.2 % (ref 11.0–15.0)
WBC: 12.5 10*3/uL — AB (ref 3.8–10.8)

## 2016-01-21 LAB — URIC ACID: URIC ACID, SERUM: 7 mg/dL (ref 2.5–7.0)

## 2016-01-21 LAB — C-REACTIVE PROTEIN: CRP: 5.3 mg/dL — ABNORMAL HIGH (ref <0.60)

## 2016-01-21 LAB — RHEUMATOID FACTOR: Rhuematoid fact SerPl-aCnc: <10 IU/mL (ref <=14)

## 2016-01-22 LAB — SEDIMENTATION RATE: SED RATE: 63 mm/h — AB (ref 0–30)

## 2016-01-25 LAB — ANTI-NUCLEAR AB-TITER (ANA TITER): ANA Titer 1: 1:160 {titer} — ABNORMAL HIGH

## 2016-01-25 LAB — ANA: ANA: POSITIVE — AB

## 2016-01-27 ENCOUNTER — Ambulatory Visit (INDEPENDENT_AMBULATORY_CARE_PROVIDER_SITE_OTHER): Payer: Medicare Other | Admitting: Podiatry

## 2016-01-27 ENCOUNTER — Encounter: Payer: Self-pay | Admitting: Podiatry

## 2016-01-27 VITALS — BP 105/62 | HR 70 | Temp 97.7°F | Resp 12

## 2016-01-27 DIAGNOSIS — M1000 Idiopathic gout, unspecified site: Secondary | ICD-10-CM | POA: Diagnosis not present

## 2016-01-27 NOTE — Progress Notes (Signed)
   Subjective:    Patient ID: Crystal Parsons, female    DOB: [birth year], 70 y.o.   MRN: [id number]  HPI This patient presents for follow-up visit of 01/20/2016. At that time patient is referred to the lab for CBC and arthritis panel and colchicine 0.6 mg 1 daily #30 and cephalexin 500 mg by mouth 3 times a day 7 days and has completed all the cephalexin.Marland Kitchen Hydrocodone 10/325 was also prescribed and patient states that his medication did not reduce her pain for the first 3 days and she discontinue the use of this medication. Patient using colchicine 0.6 mg 1 daily without any complaints from the medication. Patient states that her left foot as less painful less swollen from the previous visit she says the left foot still has some burning and swelling Patient has had a previous acute episode of foot swelling in 2014 which did respond empirically to colchicine at that time. Lab results did not confirm elevated uric acid. Also, patient does describe some reoccurring migratory joint pain over the last 6-12 months.   Review of Systems  Cardiovascular: Positive for leg swelling.       Objective:   Physical Exam  Orientated 3  BP 105/62 Temperature 97.0 Pulse 70 Respiration 12  Vascular: No calf edema or calf tenderness bilaterally DP and PT pulses 2/4 bilaterally Capillary reflex immediate bilaterally  Neurological: Sensation to 10 g monofilament wire intact 4/5 right 3/5 left Vibratory sensation equal and reactive bilaterally Ankle reflex equal and reactive bilaterally  Dermatological: No open skin lesions bilaterally Low-grade edema dorsal left foot without any erythema or warmth The second left toe is mildly edematous and low-grade erythema at the proximal interphalangeal joint (less edematous and less erythematous from baseline of 01/20/2016)  Musculoskeletal: Patient appears to have stable gait without limping  Lab results dated 01/20/2016  ANA positive titer  1:160 C-reactive protein 5.3 elevated Rheumatoid factor within normal limits Sedimentation rate 63 elevated Uric acid 7.0 (2. 5-7) high and of normal value CBC  WBC elevated 12.5 Neutrophils elevated 9750 Eosinophils: 0 low      Assessment & Plan:   Assessment: Reducing pain and swelling left foot Suspect gout with further evaluation indicated, can not rule out episode of cellulitis Inflammatory arthritis requiring further evaluation Elevated ANA titer with further evaluation indicated Elevated WBC which may be related to this inflammatory episode  Plan: At this time I reviewed the results of lab with patient today and gave patient copy of recent lab. I made aware that I could not give her a definitive diagnosis, however, her symptoms of pain and swelling are reducing. I recommended that she continue taking the 0.6 mg colchicine 1 daily until pain and swelling ends. I instructed her not to refill the cephalexin unless she noticed increase of redness and warmth in the left foot. I will refer to her primary care physician for further evaluation of the elevated lab results as described above. I made aware that if her primary care physician could not give her a definitive diagnosis a referral to rheumatologist would be indicated  Patient given copies of recent lab and instructed to obtain chart records from my chart and follow-up with Dr. Lavone Orn her primary care physician or if she has in the acute exacerbation return to our office  Reappoint at patient's request

## 2016-01-27 NOTE — Patient Instructions (Signed)
The swelling in the pain in your left foot has reduced from the baseline of 01/20/2016 Today we reviewed the results of your lab with a recommendation that you have follow-up with your primary care physician Dr. Lavone Orn or possibly a rheumatologist I cannot definitively diagnose gout, however, I am recommending that you continue taking one colchicine tablet once daily until the pain and swelling ends  Reappoint at your request

## 2016-03-10 DIAGNOSIS — Z0000 Encounter for general adult medical examination without abnormal findings: Secondary | ICD-10-CM | POA: Diagnosis not present

## 2016-03-10 DIAGNOSIS — E669 Obesity, unspecified: Secondary | ICD-10-CM | POA: Diagnosis not present

## 2016-03-10 DIAGNOSIS — M79672 Pain in left foot: Secondary | ICD-10-CM | POA: Diagnosis not present

## 2016-03-10 DIAGNOSIS — E782 Mixed hyperlipidemia: Secondary | ICD-10-CM | POA: Diagnosis not present

## 2016-03-10 DIAGNOSIS — Z1389 Encounter for screening for other disorder: Secondary | ICD-10-CM | POA: Diagnosis not present

## 2016-03-10 DIAGNOSIS — Z6833 Body mass index (BMI) 33.0-33.9, adult: Secondary | ICD-10-CM | POA: Diagnosis not present

## 2016-03-10 DIAGNOSIS — I10 Essential (primary) hypertension: Secondary | ICD-10-CM | POA: Diagnosis not present

## 2016-03-16 ENCOUNTER — Other Ambulatory Visit: Payer: Self-pay | Admitting: Gastroenterology

## 2016-03-16 DIAGNOSIS — D123 Benign neoplasm of transverse colon: Secondary | ICD-10-CM | POA: Diagnosis not present

## 2016-03-16 DIAGNOSIS — D124 Benign neoplasm of descending colon: Secondary | ICD-10-CM | POA: Diagnosis not present

## 2016-03-17 DIAGNOSIS — D123 Benign neoplasm of transverse colon: Secondary | ICD-10-CM | POA: Diagnosis not present

## 2016-03-17 DIAGNOSIS — Z1211 Encounter for screening for malignant neoplasm of colon: Secondary | ICD-10-CM | POA: Diagnosis not present

## 2016-03-17 DIAGNOSIS — D126 Benign neoplasm of colon, unspecified: Secondary | ICD-10-CM | POA: Diagnosis not present

## 2016-03-17 DIAGNOSIS — D124 Benign neoplasm of descending colon: Secondary | ICD-10-CM | POA: Diagnosis not present

## 2016-03-24 DIAGNOSIS — M1990 Unspecified osteoarthritis, unspecified site: Secondary | ICD-10-CM | POA: Diagnosis not present

## 2016-04-22 DIAGNOSIS — M109 Gout, unspecified: Secondary | ICD-10-CM | POA: Diagnosis not present

## 2016-05-06 DIAGNOSIS — Z23 Encounter for immunization: Secondary | ICD-10-CM | POA: Diagnosis not present

## 2016-05-09 DIAGNOSIS — D2371 Other benign neoplasm of skin of right lower limb, including hip: Secondary | ICD-10-CM | POA: Diagnosis not present

## 2016-05-09 DIAGNOSIS — L920 Granuloma annulare: Secondary | ICD-10-CM | POA: Diagnosis not present

## 2016-05-09 DIAGNOSIS — L821 Other seborrheic keratosis: Secondary | ICD-10-CM | POA: Diagnosis not present

## 2016-05-09 DIAGNOSIS — Z85828 Personal history of other malignant neoplasm of skin: Secondary | ICD-10-CM | POA: Diagnosis not present

## 2016-05-09 DIAGNOSIS — D225 Melanocytic nevi of trunk: Secondary | ICD-10-CM | POA: Diagnosis not present

## 2016-05-09 DIAGNOSIS — L918 Other hypertrophic disorders of the skin: Secondary | ICD-10-CM | POA: Diagnosis not present

## 2016-06-09 DIAGNOSIS — M109 Gout, unspecified: Secondary | ICD-10-CM | POA: Diagnosis not present

## 2016-06-30 ENCOUNTER — Inpatient Hospital Stay (HOSPITAL_COMMUNITY)
Admission: EM | Admit: 2016-06-30 | Discharge: 2016-07-11 | DRG: 025 | Disposition: A | Discharge status: Rehabilitation Facility | Payer: Medicare Other | Attending: Internal Medicine | Admitting: Internal Medicine

## 2016-06-30 ENCOUNTER — Encounter (HOSPITAL_COMMUNITY): Payer: Self-pay | Admitting: Emergency Medicine

## 2016-06-30 ENCOUNTER — Inpatient Hospital Stay (HOSPITAL_COMMUNITY): Payer: Medicare Other

## 2016-06-30 DIAGNOSIS — R220 Localized swelling, mass and lump, head: Secondary | ICD-10-CM | POA: Diagnosis not present

## 2016-06-30 DIAGNOSIS — R918 Other nonspecific abnormal finding of lung field: Secondary | ICD-10-CM | POA: Diagnosis not present

## 2016-06-30 DIAGNOSIS — R2689 Other abnormalities of gait and mobility: Secondary | ICD-10-CM | POA: Diagnosis present

## 2016-06-30 DIAGNOSIS — Z4682 Encounter for fitting and adjustment of non-vascular catheter: Secondary | ICD-10-CM | POA: Diagnosis not present

## 2016-06-30 DIAGNOSIS — J9601 Acute respiratory failure with hypoxia: Secondary | ICD-10-CM | POA: Diagnosis not present

## 2016-06-30 DIAGNOSIS — F419 Anxiety disorder, unspecified: Secondary | ICD-10-CM | POA: Diagnosis present

## 2016-06-30 DIAGNOSIS — N179 Acute kidney failure, unspecified: Secondary | ICD-10-CM | POA: Diagnosis present

## 2016-06-30 DIAGNOSIS — G936 Cerebral edema: Secondary | ICD-10-CM | POA: Diagnosis present

## 2016-06-30 DIAGNOSIS — I340 Nonrheumatic mitral (valve) insufficiency: Secondary | ICD-10-CM | POA: Diagnosis present

## 2016-06-30 DIAGNOSIS — T380X5A Adverse effect of glucocorticoids and synthetic analogues, initial encounter: Secondary | ICD-10-CM | POA: Diagnosis present

## 2016-06-30 DIAGNOSIS — Z79899 Other long term (current) drug therapy: Secondary | ICD-10-CM

## 2016-06-30 DIAGNOSIS — J9690 Respiratory failure, unspecified, unspecified whether with hypoxia or hypercapnia: Secondary | ICD-10-CM | POA: Diagnosis not present

## 2016-06-30 DIAGNOSIS — R0602 Shortness of breath: Secondary | ICD-10-CM

## 2016-06-30 DIAGNOSIS — I341 Nonrheumatic mitral (valve) prolapse: Secondary | ICD-10-CM | POA: Diagnosis present

## 2016-06-30 DIAGNOSIS — Z888 Allergy status to other drugs, medicaments and biological substances status: Secondary | ICD-10-CM | POA: Diagnosis not present

## 2016-06-30 DIAGNOSIS — G9340 Encephalopathy, unspecified: Secondary | ICD-10-CM | POA: Diagnosis present

## 2016-06-30 DIAGNOSIS — E872 Acidosis: Secondary | ICD-10-CM

## 2016-06-30 DIAGNOSIS — Z91018 Allergy to other foods: Secondary | ICD-10-CM | POA: Diagnosis not present

## 2016-06-30 DIAGNOSIS — I10 Essential (primary) hypertension: Secondary | ICD-10-CM | POA: Diagnosis not present

## 2016-06-30 DIAGNOSIS — R482 Apraxia: Secondary | ICD-10-CM | POA: Diagnosis not present

## 2016-06-30 DIAGNOSIS — D72829 Elevated white blood cell count, unspecified: Secondary | ICD-10-CM | POA: Diagnosis present

## 2016-06-30 DIAGNOSIS — E785 Hyperlipidemia, unspecified: Secondary | ICD-10-CM | POA: Diagnosis present

## 2016-06-30 DIAGNOSIS — E1310 Other specified diabetes mellitus with ketoacidosis without coma: Secondary | ICD-10-CM | POA: Diagnosis not present

## 2016-06-30 DIAGNOSIS — E119 Type 2 diabetes mellitus without complications: Secondary | ICD-10-CM | POA: Diagnosis not present

## 2016-06-30 DIAGNOSIS — G939 Disorder of brain, unspecified: Secondary | ICD-10-CM

## 2016-06-30 DIAGNOSIS — R531 Weakness: Secondary | ICD-10-CM | POA: Diagnosis present

## 2016-06-30 DIAGNOSIS — R414 Neurologic neglect syndrome: Secondary | ICD-10-CM | POA: Diagnosis present

## 2016-06-30 DIAGNOSIS — C711 Malignant neoplasm of frontal lobe: Secondary | ICD-10-CM | POA: Diagnosis not present

## 2016-06-30 DIAGNOSIS — D496 Neoplasm of unspecified behavior of brain: Secondary | ICD-10-CM

## 2016-06-30 DIAGNOSIS — Z85841 Personal history of malignant neoplasm of brain: Secondary | ICD-10-CM | POA: Diagnosis not present

## 2016-06-30 DIAGNOSIS — G9389 Other specified disorders of brain: Secondary | ICD-10-CM

## 2016-06-30 DIAGNOSIS — R402432 Glasgow coma scale score 3-8, at arrival to emergency department: Secondary | ICD-10-CM | POA: Diagnosis present

## 2016-06-30 DIAGNOSIS — I160 Hypertensive urgency: Secondary | ICD-10-CM | POA: Diagnosis present

## 2016-06-30 DIAGNOSIS — R739 Hyperglycemia, unspecified: Secondary | ICD-10-CM

## 2016-06-30 DIAGNOSIS — D72821 Monocytosis (symptomatic): Secondary | ICD-10-CM | POA: Diagnosis not present

## 2016-06-30 DIAGNOSIS — G40901 Epilepsy, unspecified, not intractable, with status epilepticus: Secondary | ICD-10-CM | POA: Diagnosis present

## 2016-06-30 DIAGNOSIS — B9620 Unspecified Escherichia coli [E. coli] as the cause of diseases classified elsewhere: Secondary | ICD-10-CM | POA: Diagnosis present

## 2016-06-30 DIAGNOSIS — E1165 Type 2 diabetes mellitus with hyperglycemia: Secondary | ICD-10-CM | POA: Diagnosis present

## 2016-06-30 DIAGNOSIS — R569 Unspecified convulsions: Secondary | ICD-10-CM

## 2016-06-30 DIAGNOSIS — G8194 Hemiplegia, unspecified affecting left nondominant side: Secondary | ICD-10-CM | POA: Diagnosis present

## 2016-06-30 DIAGNOSIS — J9602 Acute respiratory failure with hypercapnia: Secondary | ICD-10-CM

## 2016-06-30 DIAGNOSIS — Z4659 Encounter for fitting and adjustment of other gastrointestinal appliance and device: Secondary | ICD-10-CM

## 2016-06-30 DIAGNOSIS — C719 Malignant neoplasm of brain, unspecified: Secondary | ICD-10-CM | POA: Diagnosis not present

## 2016-06-30 DIAGNOSIS — I700 Atherosclerosis of aorta: Secondary | ICD-10-CM | POA: Diagnosis present

## 2016-06-30 DIAGNOSIS — Z452 Encounter for adjustment and management of vascular access device: Secondary | ICD-10-CM | POA: Diagnosis not present

## 2016-06-30 DIAGNOSIS — G40909 Epilepsy, unspecified, not intractable, without status epilepticus: Secondary | ICD-10-CM | POA: Diagnosis not present

## 2016-06-30 DIAGNOSIS — M109 Gout, unspecified: Secondary | ICD-10-CM | POA: Diagnosis present

## 2016-06-30 DIAGNOSIS — R1311 Dysphagia, oral phase: Secondary | ICD-10-CM | POA: Diagnosis present

## 2016-06-30 DIAGNOSIS — R300 Dysuria: Secondary | ICD-10-CM

## 2016-06-30 DIAGNOSIS — Z8739 Personal history of other diseases of the musculoskeletal system and connective tissue: Secondary | ICD-10-CM

## 2016-06-30 DIAGNOSIS — R4189 Other symptoms and signs involving cognitive functions and awareness: Secondary | ICD-10-CM | POA: Diagnosis present

## 2016-06-30 DIAGNOSIS — E876 Hypokalemia: Secondary | ICD-10-CM | POA: Diagnosis not present

## 2016-06-30 DIAGNOSIS — R05 Cough: Secondary | ICD-10-CM | POA: Diagnosis not present

## 2016-06-30 DIAGNOSIS — N390 Urinary tract infection, site not specified: Secondary | ICD-10-CM | POA: Diagnosis present

## 2016-06-30 HISTORY — DX: Gout, unspecified: M10.9

## 2016-06-30 HISTORY — DX: Genetic susceptibility to other malignant neoplasm: Z15.09

## 2016-06-30 LAB — URINE MICROSCOPIC-ADD ON

## 2016-06-30 LAB — I-STAT CHEM 8, ED
BUN: 7 mg/dL (ref 6–20)
CREATININE: 0.7 mg/dL (ref 0.44–1.00)
Calcium, Ion: 1.1 mmol/L — ABNORMAL LOW (ref 1.15–1.40)
Chloride: 88 mmol/L — ABNORMAL LOW (ref 101–111)
Glucose, Bld: 287 mg/dL — ABNORMAL HIGH (ref 65–99)
HEMATOCRIT: 59 % — AB (ref 36.0–46.0)
HEMOGLOBIN: 20.1 g/dL — AB (ref 12.0–15.0)
POTASSIUM: 3.3 mmol/L — AB (ref 3.5–5.1)
Sodium: 127 mmol/L — ABNORMAL LOW (ref 135–145)
TCO2: 22 mmol/L (ref 0–100)

## 2016-06-30 LAB — BASIC METABOLIC PANEL
ANION GAP: 25 — AB (ref 5–15)
BUN: 12 mg/dL (ref 6–20)
CALCIUM: 10.4 mg/dL — AB (ref 8.9–10.3)
CO2: 12 mmol/L — AB (ref 22–32)
Chloride: 105 mmol/L (ref 101–111)
Creatinine, Ser: 1.1 mg/dL — ABNORMAL HIGH (ref 0.44–1.00)
GFR calc non Af Amer: 49 mL/min — ABNORMAL LOW (ref >60)
GFR, EST AFRICAN AMERICAN: 57 mL/min — AB (ref >60)
Glucose, Bld: 184 mg/dL — ABNORMAL HIGH (ref 65–99)
POTASSIUM: 3.2 mmol/L — AB (ref 3.5–5.1)
Sodium: 142 mmol/L (ref 135–145)

## 2016-06-30 LAB — URINALYSIS, ROUTINE W REFLEX MICROSCOPIC
Bilirubin Urine: NEGATIVE
Glucose, UA: NEGATIVE mg/dL
Hgb urine dipstick: NEGATIVE
KETONES UR: NEGATIVE mg/dL
LEUKOCYTES UA: NEGATIVE
NITRITE: NEGATIVE
PH: 5.5 (ref 5.0–8.0)
PROTEIN: 30 mg/dL — AB
Specific Gravity, Urine: 1.016 (ref 1.005–1.030)

## 2016-06-30 LAB — CBC WITH DIFFERENTIAL/PLATELET
BASOS ABS: 0.2 10*3/uL — AB (ref 0.0–0.1)
Basophils Relative: 1 %
EOS ABS: 0.4 10*3/uL (ref 0.0–0.7)
Eosinophils Relative: 2 %
HEMATOCRIT: 42.1 % (ref 36.0–46.0)
HEMOGLOBIN: 14 g/dL (ref 12.0–15.0)
LYMPHS PCT: 43 %
Lymphs Abs: 8.8 10*3/uL — ABNORMAL HIGH (ref 0.7–4.0)
MCH: 31.1 pg (ref 26.0–34.0)
MCHC: 33.3 g/dL (ref 30.0–36.0)
MCV: 93.6 fL (ref 78.0–100.0)
MONOS PCT: 9 %
Monocytes Absolute: 1.8 10*3/uL — ABNORMAL HIGH (ref 0.1–1.0)
NEUTROS PCT: 45 %
Neutro Abs: 9.2 10*3/uL — ABNORMAL HIGH (ref 1.7–7.7)
Platelets: 365 10*3/uL (ref 150–400)
RBC: 4.5 MIL/uL (ref 3.87–5.11)
RDW: 13 % (ref 11.5–15.5)
WBC: 20.4 10*3/uL — AB (ref 4.0–10.5)

## 2016-06-30 LAB — CBG MONITORING, ED: Glucose-Capillary: 163 mg/dL — ABNORMAL HIGH (ref 65–99)

## 2016-06-30 LAB — I-STAT TROPONIN, ED: TROPONIN I, POC: 0 ng/mL (ref 0.00–0.08)

## 2016-06-30 MED ORDER — PROPOFOL 10 MG/ML IV BOLUS
INTRAVENOUS | Status: AC
  Filled 2016-06-30: qty 20

## 2016-06-30 MED ORDER — FENTANYL CITRATE (PF) 100 MCG/2ML IJ SOLN
50.0000 ug | INTRAMUSCULAR | Status: DC | PRN
  Administered 2016-07-01: 50 ug via INTRAVENOUS

## 2016-06-30 MED ORDER — SODIUM CHLORIDE 0.9 % IV SOLN
250.0000 mL | INTRAVENOUS | Status: DC | PRN
  Administered 2016-07-07 (×3): via INTRAVENOUS

## 2016-06-30 MED ORDER — SODIUM CHLORIDE 0.9 % IV SOLN
INTRAVENOUS | Status: DC
  Administered 2016-07-01 – 2016-07-02 (×3): via INTRAVENOUS
  Administered 2016-07-03: 75 mL/h via INTRAVENOUS
  Administered 2016-07-03 – 2016-07-05 (×4): via INTRAVENOUS

## 2016-06-30 MED ORDER — NICARDIPINE HCL IN NACL 20-0.86 MG/200ML-% IV SOLN
3.0000 mg/h | INTRAVENOUS | Status: DC
  Filled 2016-06-30: qty 200

## 2016-06-30 MED ORDER — POTASSIUM CHLORIDE 20 MEQ/15ML (10%) PO SOLN
40.0000 meq | Freq: Once | ORAL | Status: AC
  Administered 2016-07-01: 40 meq
  Filled 2016-06-30: qty 30

## 2016-06-30 MED ORDER — LABETALOL HCL 5 MG/ML IV SOLN
20.0000 mg | Freq: Once | INTRAVENOUS | Status: AC
  Administered 2016-06-30: 20 mg via INTRAVENOUS
  Filled 2016-06-30: qty 4

## 2016-06-30 MED ORDER — LABETALOL HCL 5 MG/ML IV SOLN
10.0000 mg | INTRAVENOUS | Status: DC | PRN
  Filled 2016-06-30: qty 4

## 2016-06-30 MED ORDER — FAMOTIDINE IN NACL 20-0.9 MG/50ML-% IV SOLN
20.0000 mg | Freq: Two times a day (BID) | INTRAVENOUS | Status: DC
  Administered 2016-07-01 (×2): 20 mg via INTRAVENOUS
  Filled 2016-06-30 (×3): qty 50

## 2016-06-30 MED ORDER — PROPOFOL 1000 MG/100ML IV EMUL
0.0000 ug/kg/min | INTRAVENOUS | Status: DC
  Administered 2016-07-01: 50 ug/kg/min via INTRAVENOUS
  Administered 2016-07-01: 45 ug/kg/min via INTRAVENOUS
  Administered 2016-07-01: 50 ug/kg/min via INTRAVENOUS
  Administered 2016-07-01: 45 ug/kg/min via INTRAVENOUS
  Administered 2016-07-02 (×2): 50 ug/kg/min via INTRAVENOUS
  Filled 2016-06-30 (×8): qty 100

## 2016-06-30 MED ORDER — FENTANYL CITRATE (PF) 100 MCG/2ML IJ SOLN: 50.0000 ug | INTRAMUSCULAR | Status: DC | PRN

## 2016-06-30 MED ORDER — PROPOFOL 500 MG/50ML IV EMUL
INTRAVENOUS | Status: AC | PRN
  Administered 2016-06-30: 100 ug/kg/min via INTRAVENOUS
  Administered 2016-06-30: 5 ug/kg/min via INTRAVENOUS
  Administered 2016-07-01: 20 mg/h via INTRAVENOUS

## 2016-06-30 MED ORDER — SODIUM CHLORIDE 0.9 % IV SOLN
1000.0000 mg | Freq: Two times a day (BID) | INTRAVENOUS | Status: DC
  Administered 2016-07-01 – 2016-07-11 (×21): 1000 mg via INTRAVENOUS
  Filled 2016-06-30 (×25): qty 10

## 2016-06-30 MED ORDER — INSULIN ASPART 100 UNIT/ML ~~LOC~~ SOLN
2.0000 [IU] | SUBCUTANEOUS | Status: DC
  Administered 2016-07-01: 4 [IU] via SUBCUTANEOUS
  Administered 2016-07-01 – 2016-07-03 (×5): 2 [IU] via SUBCUTANEOUS

## 2016-06-30 MED ORDER — PROPOFOL 1000 MG/100ML IV EMUL
INTRAVENOUS | Status: AC
  Filled 2016-06-30: qty 100

## 2016-06-30 MED ORDER — ROCURONIUM BROMIDE 50 MG/5ML IV SOLN
INTRAVENOUS | Status: AC | PRN
  Administered 2016-06-30: 100 mg via INTRAVENOUS

## 2016-06-30 NOTE — H&P (Signed)
PULMONARY / CRITICAL CARE MEDICINE   Name: Crystal Parsons MRN: [id number] DOB: 04-14-1946    ADMISSION DATE:  06/30/2016 CONSULTATION DATE:  11/30  REFERRING MD:  Dr. Ron Parker EDP resident  CHIEF COMPLAINT: Seizure  HISTORY OF PRESENT ILLNESS:  Patient is encephalopathic and/or intubated. Therefore history has been obtained from chart review and husband. 70 year old female with PMH significant for HTN and gout. She was recently diagnosed with gout and treated with colchicine, allopurinol, and a short course of prednisone. Since that time she had been improving. 11/28 overnight she had 2 seizures. She told her husband about this in the morning, she had additional seizures the following night and upon awakening called her PCP who recommended following up in clinic the next day, however, before she could do this she seized again. This is the first seizure witnessed by her husband and he called EMS. She was seizing en route and in ED and was promptly intubated for airway protection. PCCM to see.   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  PAST SURGICAL HISTORY: She  has a past surgical history that includes Abdominal hysterectomy; Hemorroidectomy; Breast lumpectomy (Left); and Tubal ligation.  Allergies  Allergen Reactions  . Accupril [Quinapril Hcl]     *ACE INHIBITORS*   . Ace Inhibitors   . Novocain [Procaine]   . Reglan [Metoclopramide] Other (See Comments)    Muscle aches  . Tylenol [Acetaminophen]   . Zoloft [Sertraline Hcl] Other (See Comments)    Anxiety  . Naproxen Itching and Rash    No current facility-administered medications on file prior to encounter.    Current Outpatient Prescriptions on File Prior to  Encounter  Medication Sig  . aspirin EC 81 MG tablet Take 81 mg by mouth daily.  . cephALEXin (KEFLEX) 500 MG capsule Take 1 capsule (500 mg total) by mouth 3 (three) times daily.  . Cholecalciferol (VITAMIN D) 2000 UNITS tablet Take 2,000 Units by mouth daily.  . colchicine 0.6 MG tablet Take 1 tablet (0.6 mg total) by mouth daily.  . fluticasone (FLONASE) 50 MCG/ACT nasal spray   . HYDROcodone-acetaminophen (NORCO) 10-325 MG tablet Take 1 tablet by mouth every 8 (eight) hours as needed.  Marland Kitchen losartan-hydrochlorothiazide (HYZAAR) 50-12.5 MG tablet   . Multiple Vitamin (MULTIVITAMIN) tablet Take 1 tablet by mouth daily.  . pravastatin (PRAVACHOL) 40 MG tablet     FAMILY HISTORY:  Her indicated that her mother is deceased. She indicated that her father is deceased.    SOCIAL HISTORY: She  reports that she has never smoked. She does not have any smokeless tobacco history on file. She reports that she drinks alcohol. She reports that she does not use drugs.  REVIEW OF SYSTEMS:   Unable as patient in encephalopathic and intubated  SUBJECTIVE:    VITAL SIGNS: BP 121/87   Pulse 99   Resp 22   Ht 5\' 4"  (1.626 m)   Wt 79.4 kg (175 lb)   SpO2 94%   BMI 30.04 kg/m   HEMODYNAMICS:    VENTILATOR SETTINGS: Vent Mode: PRVC FiO2 (%):  [40 %] 40 % Set Rate:  [15 bmp] 15 bmp Vt Set:  [500 mL] 500 mL PEEP:  [5 cmH20] 5 cmH20 Plateau Pressure:  [19 cmH20] 19 cmH20  INTAKE / OUTPUT: No  intake/output data recorded.  PHYSICAL EXAMINATION: General:  Overweight female in NAD on vent Neuro:  Obtunded, sedated HEENT:  [place]/AT, PERRL, no JVD Cardiovascular:  RRR, no MRG Lungs:  Clear vent assisted breaths Abdomen:  Soft, non-distended Musculoskeletal:  No acute deformity Skin:  Grossly intact  LABS:  BMET  Recent Labs Lab 06/30/16 2223 06/30/16 2306  NA 142 127*  K 3.2* 3.3*  CL 105 88*  CO2 12*  --   BUN 12 7  CREATININE 1.10* 0.70  GLUCOSE 184* 287*     Electrolytes  Recent Labs Lab 06/30/16 2223  CALCIUM 10.4*    CBC  Recent Labs Lab 06/30/16 2223 06/30/16 2306  WBC 20.4*  --   HGB 14.0 20.1*  HCT 42.1 59.0*  PLT 365  --     Coag's No results for input(s): APTT, INR in the last 168 hours.  Sepsis Markers No results for input(s): LATICACIDVEN, PROCALCITON, O2SATVEN in the last 168 hours.  ABG No results for input(s): PHART, PCO2ART, PO2ART in the last 168 hours.  Liver Enzymes No results for input(s): AST, ALT, ALKPHOS, BILITOT, ALBUMIN in the last 168 hours.  Cardiac Enzymes No results for input(s): TROPONINI, PROBNP in the last 168 hours.  Glucose  Recent Labs Lab 06/30/16 2229  GLUCAP 163*    Imaging No results found.   STUDIES:  CT head 11/30 >>>  CULTURES:   ANTIBIOTICS:   SIGNIFICANT EVENTS: 11/28-11/30 seizures at home 11/30 to ED with seizures, intubated for status.  LINES/TUBES: ERR 11/30 >>>  DISCUSSION: 70 year old female with no history of seizures admitted 11/30 for 7 seizures in the last 24 hours. Seizing en route to, and in ER. Intubated for airway protection. PCCM to admit. CT head and neuro consult pending.   ASSESSMENT / PLAN:  PULMONARY A: Acute hypoxemic respiratory failure secondary to status epilepticus   P:   Full vent support ABG CXR in AM, assess for possible aspiration  CARDIOVASCULAR A:  Hypertensive urgency  P:  Telemetry monitoring PRN labetalol to keep SBP < 130-160 Resume home losartan/HCTZ in AM  RENAL A:   Hypokalemia ?AKI, unable to establish baseline  P:   Replete K 15meq Follow UPO BMP in AM Check CK  GASTROINTESTINAL A:   No acute issues  P:   NPO Pepcid for SUP  HEMATOLOGIC A:   No acute issues  P:  Follow CBC SCDs for now pending head CT  INFECTIOUS A:   Leukocytosis. Likely acute phase reactant, but possibly aspirated although no witnessed vomiting.   P:   Defer ABX Follow CXR, WBC, Fever  curve  ENDOCRINE A:   DM listed in history but on no meds for this. Hyperglycemic in ED  P:   CBG monitoring and SSI  NEUROLOGIC A:   Status epilepticus  P:   RASS goal: -1 Propofol gtt for sedation PRN fentanyl for analgesia CT head pending EDP to consult neurology/neurosurgery depending on CT results.  Will load Keppra and await neuro recommendations.    FAMILY  - Updates: Husband updated bedside in ED  - Inter-disciplinary family meet or Palliative Care meeting due by:  12/6   Georgann Housekeeper, AGACNP-BC [facility] Pager [phone] or [phone]  06/30/2016 11:42 PM

## 2016-06-30 NOTE — Progress Notes (Signed)
   06/30/16 2225  Clinical Encounter Type  Visited With Family;Health care provider  Visit Type Initial;ED  Referral From Nurse  Consult/Referral To Chaplain  Stress Factors  Patient Stress Factors Not reviewed  Family Stress Factors None identified  Advance Directives (For Healthcare)  Does Patient Have a Medical Advance Directive? No  Would patient like information on creating a medical advance directive? No - Patient declined  [place]  Does Patient Have a Mental Health Advance Directive? No  Would patient like information on creating a mental health advance directive? No - Patient declined    Chaplain responded to page from Ed. Pt in trauma B actively seizing. Pt husband in ed waiting area, escorted pt husband to consult B. Provided ministry of presence, emotional support, and hospitality. He is relatively calm and awaiting an update.

## 2016-06-30 NOTE — ED Triage Notes (Signed)
Pt brought to ED by EMS from home for new unset of seizures that started last night, total 7 episodes of seizures PTA to ED, very post ictal with no eye contact. BP 170/94, HR 120, SPO2 98% on 2L [place].

## 2016-06-30 NOTE — ED Provider Notes (Signed)
[facility] DEPT Provider Note   CSN: [id number] Arrival date & time: 06/30/16  2212     History   Chief Complaint Chief Complaint  Patient presents with  . Seizures    HPI Crystal Parsons is a 70 y.o. female.   Seizures   This is a new problem. The current episode started 12 to 24 hours ago. The problem has not changed since onset.There were 6 to 10 seizures. Pertinent negatives include no headaches, no cough, no nausea and no vomiting. Characteristics include eye deviation, bladder incontinence, rhythmic jerking and bit tongue. The episode was witnessed. There was no sensation of an aura present. The seizures continued in the ED. The seizure(s) had no focality. There has been no fever.    Past Medical History:  Diagnosis Date  . Anxiety   . Bradycardia    At times with pulse in the 40s  . Diabetes ([place])   . Diarrhea    With blating, improved with gluten-free diet  . Diastolic dysfunction   . Essential hypertension, benign    Always has HTN when at the doctor's office.  . Hyperlipidemia   . 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 Mitral valve problem    Mildly thickened mitral valve  . Mitral valve prolapse    Mild, anterior  . MR (mitral regurgitation)    ECHO 08/10/09 shows mild MR again and normal EF. No significant changes from prior ECHO.  Marland Kitchen Palpitations    Occasional, but are not significant. ECHO 08/20/08 - Normal EF (60%), mildly thickened mitral valve with mild anterior mitral valve prolapse, mild MR, mild aortic sclerosis, grade 1 diastolic dysfunction.  . Proteinuria    Likely secondary to Diabetes    Patient Active Problem List   Diagnosis Date Noted  . Seizure ([place]) 06/30/2016  . Status epilepticus ([place]) 06/30/2016    Past Surgical History:  Procedure Laterality Date  . ABDOMINAL HYSTERECTOMY    . BREAST LUMPECTOMY Left   . HEMORROIDECTOMY    . TUBAL LIGATION      OB History    No data available       Home Medications    Prior to  Admission medications   Medication Sig Start Date End Date Taking? Authorizing Provider  allopurinol (ZYLOPRIM) 300 MG tablet Take 300 mg by mouth daily. 06/10/16  Yes Historical Provider, MD  Cholecalciferol (VITAMIN D) 2000 UNITS tablet Take 2,000 Units by mouth daily.   Yes Historical Provider, MD  colchicine 0.6 MG tablet Take 1 tablet (0.6 mg total) by mouth daily. 01/20/16  Yes Richard Joelene Millin, DPM  losartan-hydrochlorothiazide (HYZAAR) 50-12.5 MG tablet Take 1 tablet by mouth daily.  01/14/16  Yes Historical Provider, MD  Multiple Vitamin (MULTIVITAMIN) tablet Take 1 tablet by mouth daily.   Yes Historical Provider, MD  pravastatin (PRAVACHOL) 40 MG tablet Take 40 mg by mouth daily.  01/14/16  Yes Historical Provider, MD  cephALEXin (KEFLEX) 500 MG capsule Take 1 capsule (500 mg total) by mouth 3 (three) times daily. Patient not taking: Reported on 06/30/2016 01/20/16   Gean Birchwood, DPM  HYDROcodone-acetaminophen (NORCO) 10-325 MG tablet Take 1 tablet by mouth every 8 (eight) hours as needed. Patient not taking: Reported on 06/30/2016 01/20/16   Gean Birchwood, DPM    Family History Family History  Problem Relation Age of Onset  . Cancer Mother   . Cancer Father     Social History Social History  Substance Use Topics  . Smoking status: Never  Smoker  . Smokeless tobacco: Not on file  . Alcohol use Yes     Comment: socially     Allergies   Accupril [quinapril hcl]; Ace inhibitors; Novocain [procaine]; Reglan [metoclopramide]; Tylenol [acetaminophen]; Zoloft [sertraline hcl]; and Naproxen   Review of Systems Review of Systems  Unable to perform ROS: Acuity of condition  Respiratory: Negative for cough.   Gastrointestinal: Negative for nausea and vomiting.  Genitourinary: Positive for bladder incontinence.  Neurological: Positive for seizures. Negative for headaches.     Physical Exam Updated Vital Signs BP 121/87   Pulse 99   Resp 22   Ht 5\' 4"  (1.626 m)    Wt 79.4 kg   SpO2 94%   BMI 30.04 kg/m   Physical Exam  Constitutional: She appears well-developed and well-nourished. No distress.  HENT:  Head: Normocephalic and atraumatic.  Eyes: Conjunctivae are normal.  Neck: Neck supple.  Cardiovascular: Regular rhythm.  Tachycardia present.   No murmur heard. Pulmonary/Chest: Effort normal and breath sounds normal. No respiratory distress.  Abdominal: Soft. There is no tenderness.  Musculoskeletal: She exhibits no edema.  Neurological: She is unresponsive. GCS eye subscore is 4. GCS verbal subscore is 1. GCS motor subscore is 1.  Eye deviation to the patient's right full-body tonic-clonic jerking. Unable to assess cranial nerves show pupils are equal approximately 2 mm minimally reactive to light, no focality to her limb movements.  Skin: Skin is warm and dry.  Psychiatric: She has a normal mood and affect.  Nursing note and vitals reviewed.    ED Treatments / Results  Labs (all labs ordered are listed, but only abnormal results are displayed) Labs Reviewed  CBC WITH DIFFERENTIAL/PLATELET - Abnormal; Notable for the following:       Result Value   WBC 20.4 (*)    Neutro Abs 9.2 (*)    Lymphs Abs 8.8 (*)    Monocytes Absolute 1.8 (*)    Basophils Absolute 0.2 (*)    All other components within normal limits  BASIC METABOLIC PANEL - Abnormal; Notable for the following:    Potassium 3.2 (*)    CO2 12 (*)    Glucose, Bld 184 (*)    Creatinine, Ser 1.10 (*)    Calcium 10.4 (*)    GFR calc non Af Amer 49 (*)    GFR calc Af Amer 57 (*)    Anion gap 25 (*)    All other components within normal limits  URINALYSIS, ROUTINE W REFLEX MICROSCOPIC (NOT AT [facility]) - Abnormal; Notable for the following:    Protein, ur 30 (*)    All other components within normal limits  URINE MICROSCOPIC-ADD ON - Abnormal; Notable for the following:    Squamous Epithelial / LPF 0-5 (*)    Bacteria, UA RARE (*)    Casts HYALINE CASTS (*)    All other  components within normal limits  CBG MONITORING, ED - Abnormal; Notable for the following:    Glucose-Capillary 163 (*)    All other components within normal limits  I-STAT CHEM 8, ED - Abnormal; Notable for the following:    Sodium 127 (*)    Potassium 3.3 (*)    Chloride 88 (*)    Glucose, Bld 287 (*)    Calcium, Ion 1.10 (*)    Hemoglobin 20.1 (*)    HCT 59.0 (*)    All other components within normal limits  BLOOD GAS, ARTERIAL  CBC  BASIC METABOLIC PANEL  BLOOD GAS, ARTERIAL  MAGNESIUM  PHOSPHORUS  CK  TRIGLYCERIDES  I-STAT TROPOININ, ED    EKG  EKG Interpretation  Date/Time:  Thursday June 30 2016 22:27:12 EST Ventricular Rate:  122 PR Interval:    QRS Duration: 92 QT Interval:  337 QTC Calculation: 481 R Axis:   91 Text Interpretation:  Sinus tachycardia Right axis deviation Minimal ST depression, inferior leads st changs likely rate related Otherwise no significant change Confirmed by FLOYD MD, Quillian Quince [id number]) on 06/30/2016 10:44:36 PM       Radiology No results found.  Procedures Procedure Name: Intubation Date/Time: 07/01/2016 12:10 AM Performed by: Ron Parker, Kelia Gibbon Pre-anesthesia Checklist: Patient identified Oxygen Delivery Method: Non-rebreather mask Preoxygenation: Pre-oxygenation with 100% oxygen Intubation Type: IV induction and Rapid sequence Ventilation: Mask ventilation without difficulty Laryngoscope Size: Mac, Glidescope and 4 Grade View: Grade II Tube size: 7.5 mm Number of attempts: 1 Airway Equipment and Method: Stylet and Video-laryngoscopy Placement Confirmation: ETT inserted through vocal cords under direct vision,  CO2 detector and Breath sounds checked- equal and bilateral Secured at: 23 cm Tube secured with: ETT holder Dental Injury: Teeth and Oropharynx as per pre-operative assessment  Future Recommendations: Recommend- induction with short-acting agent, and alternative techniques readily available      (including critical  care time)  Medications Ordered in ED Medications  propofol (DIPRIVAN) 10 mg/mL bolus/IV push (not administered)  propofol (DIPRIVAN) 1000 MG/100ML infusion (not administered)  0.9 %  sodium chloride infusion (not administered)  famotidine (PEPCID) IVPB 20 mg premix (not administered)  0.9 %  sodium chloride infusion (not administered)  levETIRAcetam (KEPPRA) 1,000 mg in sodium chloride 0.9 % 100 mL IVPB (not administered)  labetalol (NORMODYNE,TRANDATE) injection 10 mg (not administered)  potassium chloride 20 MEQ/15ML (10%) solution 40 mEq (not administered)  insulin aspart (novoLOG) injection 2-6 Units (not administered)  fentaNYL (SUBLIMAZE) injection 50 mcg (not administered)  fentaNYL (SUBLIMAZE) injection 50 mcg (not administered)  propofol (DIPRIVAN) 1000 MG/100ML infusion (not administered)  propofol (DIPRIVAN) 500 MG/50ML infusion (5 mcg/kg/min  79.4 kg Intravenous New Bag/Given 06/30/16 2239)  rocuronium (ZEMURON) injection (100 mg Intravenous Given 06/30/16 2235)  labetalol (NORMODYNE,TRANDATE) injection 20 mg (20 mg Intravenous Given 06/30/16 2249)     Initial Impression / Assessment and Plan / ED Course  I have reviewed the triage vital signs and the nursing notes.  Pertinent labs & imaging results that were available during my care of the patient were reviewed by me and considered in my medical decision making (see chart for details).  Clinical Course     70 year old female comes to [place] with multiple seizures over the last 24 hours. Diffuse tonic-clonic seizure activity is reported. She seizing upon arrival. Eye deviation to the right. Full-body stiffness and no focality with tonic-clonic movements. Vital signs show hypertension and tachycardia. Patient's GCS is alone she has got large amount of saliva do not feel she is protecting her airway so that time she was intubated using propofol and rocuronium. Patient is sedated with propofol infusion. Initial labs show  hyponatremia, mildly elevated glucose at 180, urinalysis with no signs of infection. CT head is performed and appears to be a large mass in the right frontal lobe. Likely the cause of seizure activity. She is loaded with Keppra. Patient will be admitted to the medical ICU. Neurosurgery was consulted. For the remainder this patient's care please see patient team notes.  Final Clinical Impressions(s) / ED Diagnoses   Final diagnoses:  Seizure [facility])    New Prescriptions New Prescriptions  No medications on file     Dewaine Conger, MD 07/01/16 [place], DO 07/01/16 [id number]

## 2016-06-30 NOTE — ED Triage Notes (Signed)
7 MPA apllied on ED arrival and pt bag O2.

## 2016-07-01 ENCOUNTER — Inpatient Hospital Stay (HOSPITAL_COMMUNITY): Payer: Medicare Other

## 2016-07-01 ENCOUNTER — Encounter (HOSPITAL_COMMUNITY): Payer: Self-pay

## 2016-07-01 DIAGNOSIS — J9601 Acute respiratory failure with hypoxia: Secondary | ICD-10-CM

## 2016-07-01 DIAGNOSIS — G9389 Other specified disorders of brain: Secondary | ICD-10-CM

## 2016-07-01 LAB — PHOSPHORUS
PHOSPHORUS: 4.2 mg/dL (ref 2.5–4.6)
PHOSPHORUS: 5 mg/dL — AB (ref 2.5–4.6)
Phosphorus: 4.4 mg/dL (ref 2.5–4.6)

## 2016-07-01 LAB — I-STAT ARTERIAL BLOOD GAS, ED
Acid-base deficit: 2 mmol/L (ref 0.0–2.0)
Bicarbonate: 21.1 mmol/L (ref 20.0–28.0)
O2 Saturation: 92 %
PCO2 ART: 31.4 mmHg — AB (ref 32.0–48.0)
TCO2: 22 mmol/L (ref 0–100)
pH, Arterial: 7.435 (ref 7.350–7.450)
pO2, Arterial: 61 mmHg — ABNORMAL LOW (ref 83.0–108.0)

## 2016-07-01 LAB — URINALYSIS, ROUTINE W REFLEX MICROSCOPIC
BILIRUBIN URINE: NEGATIVE
Glucose, UA: NEGATIVE mg/dL
Hgb urine dipstick: NEGATIVE
KETONES UR: NEGATIVE mg/dL
LEUKOCYTES UA: NEGATIVE
NITRITE: NEGATIVE
PH: 5.5 (ref 5.0–8.0)
Protein, ur: NEGATIVE mg/dL
Specific Gravity, Urine: 1.008 (ref 1.005–1.030)

## 2016-07-01 LAB — BLOOD GAS, ARTERIAL
Acid-base deficit: 0.4 mmol/L (ref 0.0–2.0)
Bicarbonate: 23.3 mmol/L (ref 20.0–28.0)
DRAWN BY: 36496
FIO2: 60
MECHVT: 500 mL
O2 SAT: 98.6 %
PEEP: 5 cmH2O
PH ART: 7.438 (ref 7.350–7.450)
Patient temperature: 98.6
RATE: 16 resp/min
pCO2 arterial: 35 mmHg (ref 32.0–48.0)
pO2, Arterial: 165 mmHg — ABNORMAL HIGH (ref 83.0–108.0)

## 2016-07-01 LAB — CBC
HCT: 36.2 % (ref 36.0–46.0)
Hemoglobin: 12.2 g/dL (ref 12.0–15.0)
MCH: 30.1 pg (ref 26.0–34.0)
MCHC: 33.7 g/dL (ref 30.0–36.0)
MCV: 89.4 fL (ref 78.0–100.0)
PLATELETS: 303 10*3/uL (ref 150–400)
RBC: 4.05 MIL/uL (ref 3.87–5.11)
RDW: 13 % (ref 11.5–15.5)
WBC: 16.8 10*3/uL — ABNORMAL HIGH (ref 4.0–10.5)

## 2016-07-01 LAB — BASIC METABOLIC PANEL
Anion gap: 11 (ref 5–15)
Anion gap: 9 (ref 5–15)
BUN: 11 mg/dL (ref 6–20)
BUN: 11 mg/dL (ref 6–20)
CALCIUM: 9.3 mg/dL (ref 8.9–10.3)
CALCIUM: 9.4 mg/dL (ref 8.9–10.3)
CO2: 22 mmol/L (ref 22–32)
CO2: 22 mmol/L (ref 22–32)
CREATININE: 1.05 mg/dL — AB (ref 0.44–1.00)
Chloride: 106 mmol/L (ref 101–111)
Chloride: 107 mmol/L (ref 101–111)
Creatinine, Ser: 0.89 mg/dL (ref 0.44–1.00)
GFR calc Af Amer: >60 mL/min (ref >60)
GFR calc Af Amer: >60 mL/min (ref >60)
GFR, EST NON AFRICAN AMERICAN: 52 mL/min — AB (ref >60)
GLUCOSE: 129 mg/dL — AB (ref 65–99)
GLUCOSE: 122 mg/dL — AB (ref 65–99)
POTASSIUM: 4 mmol/L (ref 3.5–5.1)
Potassium: 3.8 mmol/L (ref 3.5–5.1)
SODIUM: 138 mmol/L (ref 135–145)
Sodium: 139 mmol/L (ref 135–145)

## 2016-07-01 LAB — MRSA PCR SCREENING: MRSA BY PCR: NEGATIVE

## 2016-07-01 LAB — TRIGLYCERIDES: TRIGLYCERIDES: 217 mg/dL — AB (ref <150)

## 2016-07-01 LAB — GLUCOSE, CAPILLARY
GLUCOSE-CAPILLARY: 121 mg/dL — AB (ref 65–99)
GLUCOSE-CAPILLARY: 119 mg/dL — AB (ref 65–99)
GLUCOSE-CAPILLARY: 113 mg/dL — AB (ref 65–99)
GLUCOSE-CAPILLARY: 124 mg/dL — AB (ref 65–99)
Glucose-Capillary: 139 mg/dL — ABNORMAL HIGH (ref 65–99)
Glucose-Capillary: 121 mg/dL — ABNORMAL HIGH (ref 65–99)

## 2016-07-01 LAB — OSMOLALITY: Osmolality: 295 mOsm/kg (ref 275–295)

## 2016-07-01 LAB — CK: Total CK: 60 U/L (ref 38–234)

## 2016-07-01 LAB — MAGNESIUM
MAGNESIUM: 2.1 mg/dL (ref 1.7–2.4)
MAGNESIUM: 2.3 mg/dL (ref 1.7–2.4)
Magnesium: 2.3 mg/dL (ref 1.7–2.4)

## 2016-07-01 LAB — HEMOGLOBIN A1C
HEMOGLOBIN A1C: 5.5 % (ref 4.8–5.6)
MEAN PLASMA GLUCOSE: 111 mg/dL

## 2016-07-01 LAB — SODIUM, URINE, RANDOM: SODIUM UR: 38 mmol/L

## 2016-07-01 LAB — OSMOLALITY, URINE: OSMOLALITY UR: 258 mosm/kg — AB (ref 300–900)

## 2016-07-01 MED ORDER — ORAL CARE MOUTH RINSE
15.0000 mL | OROMUCOSAL | Status: DC
  Administered 2016-07-01 – 2016-07-02 (×14): 15 mL via OROMUCOSAL

## 2016-07-01 MED ORDER — VITAL HIGH PROTEIN PO LIQD: 1000.0000 mL | ORAL | Status: DC

## 2016-07-01 MED ORDER — MIDAZOLAM HCL 2 MG/2ML IJ SOLN
INTRAMUSCULAR | Status: AC
  Filled 2016-07-01: qty 4

## 2016-07-01 MED ORDER — FENTANYL CITRATE (PF) 100 MCG/2ML IJ SOLN
INTRAMUSCULAR | Status: AC
  Filled 2016-07-01: qty 2

## 2016-07-01 MED ORDER — MIDAZOLAM HCL 2 MG/2ML IJ SOLN
1.0000 mg | INTRAMUSCULAR | Status: DC | PRN
  Filled 2016-07-01: qty 2

## 2016-07-01 MED ORDER — FAMOTIDINE 40 MG/5ML PO SUSR
20.0000 mg | Freq: Two times a day (BID) | ORAL | Status: DC
  Administered 2016-07-01 – 2016-07-02 (×2): 20 mg
  Filled 2016-07-01 (×2): qty 2.5

## 2016-07-01 MED ORDER — MIDAZOLAM HCL 2 MG/2ML IJ SOLN
4.0000 mg | Freq: Once | INTRAMUSCULAR | Status: AC
  Administered 2016-07-01: 4 mg via INTRAVENOUS

## 2016-07-01 MED ORDER — FENTANYL CITRATE (PF) 100 MCG/2ML IJ SOLN
25.0000 ug | INTRAMUSCULAR | Status: DC | PRN
  Administered 2016-07-01: 100 ug via INTRAVENOUS
  Administered 2016-07-04: 50 ug via INTRAVENOUS
  Filled 2016-07-01 (×2): qty 2

## 2016-07-01 MED ORDER — ADULT MULTIVITAMIN LIQUID CH
15.0000 mL | Freq: Every day | ORAL | Status: DC
  Administered 2016-07-02: 15 mL
  Filled 2016-07-01 (×2): qty 15

## 2016-07-01 MED ORDER — PRO-STAT SUGAR FREE PO LIQD
30.0000 mL | Freq: Every day | ORAL | Status: DC
  Administered 2016-07-01 – 2016-07-02 (×4): 30 mL
  Filled 2016-07-01 (×4): qty 30

## 2016-07-01 MED ORDER — CHLORHEXIDINE GLUCONATE 0.12% ORAL RINSE (MEDLINE KIT)
15.0000 mL | Freq: Two times a day (BID) | OROMUCOSAL | Status: DC
  Administered 2016-07-01 – 2016-07-02 (×3): 15 mL via OROMUCOSAL

## 2016-07-01 MED ORDER — VITAL HIGH PROTEIN PO LIQD
1000.0000 mL | ORAL | Status: DC
Start: 2016-07-02 — End: 2016-07-03

## 2016-07-01 MED ORDER — MIDAZOLAM HCL 2 MG/2ML IJ SOLN: 1.0000 mg | INTRAMUSCULAR | Status: DC | PRN

## 2016-07-01 MED ORDER — GADOBENATE DIMEGLUMINE 529 MG/ML IV SOLN
20.0000 mL | Freq: Once | INTRAVENOUS | Status: AC
  Administered 2016-07-01: 18 mL via INTRAVENOUS

## 2016-07-01 MED ORDER — PRO-STAT SUGAR FREE PO LIQD
30.0000 mL | Freq: Two times a day (BID) | ORAL | Status: DC
  Administered 2016-07-01: 30 mL
  Filled 2016-07-01: qty 30

## 2016-07-01 NOTE — Consult Note (Signed)
CC:  Chief Complaint  Patient presents with  . Seizures    HPI: Crystal Parsons is a 70 y.o. female presenting to the ED via EMS after becoming unresponsive. History is obtained from the patient's husband and daughter. Over the last 2 days, she has had numerous episodes of "tremor" during which she is unresponsive. They actually called 911 yesterday but by the time EMS arrived she was back to normal. Unfortunately, after EMS left she had more episodes, and after the last one she did not regain coherence. She was intubated in the ED for airway protection and started on propofol. She has not been complaining of HA. She has no cancer history, although there is a family history of Lynch syndrome.  PMH: Past Medical History:  Diagnosis Date  . Anxiety   . Bradycardia    At times with pulse in the 40s  . Diabetes ([place])   . Diarrhea    With blating, improved with gluten-free diet  . Diastolic dysfunction   . Essential hypertension, benign    Always has HTN when at the doctor's office.  . Hyperlipidemia   . 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 Mitral valve problem    Mildly thickened mitral valve  . Mitral valve prolapse    Mild, anterior  . MR (mitral regurgitation)    ECHO 08/10/09 shows mild MR again and normal EF. No significant changes from prior ECHO.  Marland Kitchen Palpitations    Occasional, but are not significant. ECHO 08/20/08 - Normal EF (60%), mildly thickened mitral valve with mild anterior mitral valve prolapse, mild MR, mild aortic sclerosis, grade 1 diastolic dysfunction.  . Proteinuria    Likely secondary to Diabetes    PSH: Past Surgical History:  Procedure Laterality Date  . ABDOMINAL HYSTERECTOMY    . BREAST LUMPECTOMY Left   . HEMORROIDECTOMY    . TUBAL LIGATION      SH: Social History  Substance Use Topics  . Smoking status: Never Smoker  . Smokeless tobacco: Never Used  . Alcohol use Yes     Comment: socially    MEDS: Prior to Admission medications   Medication  Sig Start Date End Date Taking? Authorizing Provider  allopurinol (ZYLOPRIM) 300 MG tablet Take 300 mg by mouth daily. 06/10/16  Yes Historical Provider, MD  Cholecalciferol (VITAMIN D) 2000 UNITS tablet Take 2,000 Units by mouth daily.   Yes Historical Provider, MD  colchicine 0.6 MG tablet Take 1 tablet (0.6 mg total) by mouth daily. 01/20/16  Yes Richard Joelene Millin, DPM  losartan-hydrochlorothiazide (HYZAAR) 50-12.5 MG tablet Take 1 tablet by mouth daily.  01/14/16  Yes Historical Provider, MD  Multiple Vitamin (MULTIVITAMIN) tablet Take 1 tablet by mouth daily.   Yes Historical Provider, MD  pravastatin (PRAVACHOL) 40 MG tablet Take 40 mg by mouth daily.  01/14/16  Yes Historical Provider, MD  cephALEXin (KEFLEX) 500 MG capsule Take 1 capsule (500 mg total) by mouth 3 (three) times daily. Patient not taking: Reported on 06/30/2016 01/20/16   Gean Birchwood, DPM  HYDROcodone-acetaminophen (NORCO) 10-325 MG tablet Take 1 tablet by mouth every 8 (eight) hours as needed. Patient not taking: Reported on 06/30/2016 01/20/16   Gean Birchwood, DPM    ALLERGY: Allergies  Allergen Reactions  . Accupril [Quinapril Hcl]     *ACE INHIBITORS*   . Ace Inhibitors   . Novocain [Procaine]   . Reglan [Metoclopramide] Other (See Comments)    Muscle aches  . Tylenol [Acetaminophen]   .  Zoloft [Sertraline Hcl] Other (See Comments)    Anxiety  . Naproxen Itching and Rash    ROS: ROS  NEUROLOGIC EXAM: Intubated, sedated on propofol. No eye opening No motor responses  IMGAING: CT head reviewed demonstrating loculated heterogenous right frontal mass, without significant surrounding edema. There may be a more hyperdense lesion just posterior to the large one. No HCP.  IMPRESSION: - 70 y.o. female with SZ, cannot r/o status at this point. Likely etiology is newly seen right frontal lesion(s).  PLAN: - Will need EEG.  - MRI brain w/w/o Gad, stereotactic protocol  I have reviewed the imaging  findings from the CT with the patients husband and daughter. The plan above for further workup was discussed. All questions were answered.

## 2016-07-01 NOTE — Progress Notes (Signed)
Initial Nutrition Assessment  DOCUMENTATION CODES:   Obesity unspecified  INTERVENTION:  Initiate TF via OGT with Vital High Protein at goal rate of 10 ml/h (240 ml per day) and Prostat 30 ml 5 times daily to provide 740 kcals, 96 gm protein (88% of estimated needs) , 202 ml free water daily. Provide liquid Multivitamin with minerals via OGT daily.  TF plus current rate of propofol will provide 1366 kcals (118% of estimated needs) and 96 gm protein (88% of estimated needs).  If propofol is discontinued: Provide Vital High protein @ 40 ml/hr with 30 ml Pro-stat BID to provide 1160 kcal, 114 grams of protein, and 806 ml of water    NUTRITION DIAGNOSIS:   Inadequate oral intake related to inability to eat as evidenced by NPO status.   GOAL:   Provide needs based on ASPEN/SCCM guidelines   MONITOR:   TF tolerance, Vent status, Labs, Weight trends, Skin, I & O's  REASON FOR ASSESSMENT:   Ventilator, Consult Enteral/tube feeding initiation and management  ASSESSMENT:   70 year old female with PMH significant for HTN and gout. She was recently diagnosed with gout and treated with colchicine, allopurinol, and a short course of prednisone. Since that time she had been improving. 11/28 overnight she had 2 seizures. She was seizing en route and in ED and was promptly intubated for airway protection.   Patient is currently intubated on ventilator support MV: 7.9 L/min Temp (24hrs), Avg:98.6 F (37 C), Min:98.2 F (36.8 C), Max:98.9 F (37.2 C)  Propofol: 23.7 ml/hr (provides 626 kcal per 24 hours)  Pt appears well nourished. OGT in place to suction. Per family at bedside patient has had a good appetite recently and was eating well PTA. Per MD note, plan to start tube feeds after MRI if not going to OR, but likely going to OR.   Labs reviewed. High Triglycerides.    Diet Order:  Diet NPO time specified  Skin:  Reviewed, no issues  Last BM:  unknown  Height:   Ht  Readings from Last 1 Encounters:  06/30/16 5\' 4"  (1.626 m)    Weight:   Wt Readings from Last 1 Encounters:  07/01/16 183 lb 6.8 oz (83.2 kg)    Ideal Body Weight:  54.5 kg  BMI:  Body mass index is 31.48 kg/m.  Estimated Nutritional Needs:   Kcal:  [id number]  Protein:  >/=109 grams  Fluid:  1.8 L/day  EDUCATION NEEDS:   No education needs identified at this time  [place], CSP, LDN Inpatient Clinical Dietitian Pager: [phone] After Hours Pager: [phone]

## 2016-07-01 NOTE — Procedures (Signed)
Electroencephalogram (EEG) Report  Date of study: 07/01/16  Requesting clinician: Tera Partridge, MD  Reason for study: Evaluate for seizure  Brief clinical history: This is a 70 year old woman who is admitted following multiple seizures. EEG is being performed for further evaluation.  Medications:  Current Facility-Administered Medications:  .  0.9 %  sodium chloride infusion, 250 mL, Intravenous, PRN, Corey Harold, NP .  0.9 %  sodium chloride infusion, , Intravenous, Continuous, Corey Harold, NP, Last Rate: 75 mL/hr at 07/01/16 1100 .  chlorhexidine gluconate (MEDLINE KIT) (PERIDEX) 0.12 % solution 15 mL, 15 mL, Mouth Rinse, BID, Corey Harold, NP, 15 mL at 07/01/16 0759 .  famotidine (PEPCID) 40 MG/5ML suspension 20 mg, 20 mg, Per Tube, BID, Javier Glazier, MD .  feeding supplement (PRO-STAT SUGAR FREE 64) liquid 30 mL, 30 mL, Per Tube, 5 X Daily, Javier Glazier, MD .  Derrill Memo ON 07/02/2016] feeding supplement (VITAL HIGH PROTEIN) liquid 1,000 mL, 1,000 mL, Per Tube, Q24H, Javier Glazier, MD .  fentaNYL (SUBLIMAZE) 100 MCG/2ML injection, , , ,  .  fentaNYL (SUBLIMAZE) injection 25-100 mcg, 25-100 mcg, Intravenous, Q1H PRN, Javier Glazier, MD, 100 mcg at 07/01/16 0350 .  insulin aspart (novoLOG) injection 2-6 Units, 2-6 Units, Subcutaneous, Q4H, Corey Harold, NP, 2 Units at 07/01/16 0800 .  labetalol (NORMODYNE,TRANDATE) injection 10 mg, 10 mg, Intravenous, Q10 min PRN, Corey Harold, NP .  levETIRAcetam (KEPPRA) 1,000 mg in sodium chloride 0.9 % 100 mL IVPB, 1,000 mg, Intravenous, Q12H, Corey Harold, NP, 1,000 mg at 07/01/16 0341 .  MEDLINE mouth rinse, 15 mL, Mouth Rinse, 10 times per day, Corey Harold, NP, 15 mL at 07/01/16 1006 .  midazolam (VERSED) 2 MG/2ML injection, , , ,  .  midazolam (VERSED) injection 1-4 mg, 1-4 mg, Intravenous, Q1H PRN, Javier Glazier, MD .  Derrill Memo ON 07/02/2016] multivitamin liquid 15 mL, 15 mL, Per Tube, Daily, Javier Glazier,  MD .  propofol (DIPRIVAN) 1000 MG/100ML infusion, 0-50 mcg/kg/min, Intravenous, Continuous, Corey Harold, NP, Last Rate: 23.8 mL/hr at 07/01/16 1100, 50 mcg/kg/min at 07/01/16 1100  Description: This is a routine EEG performed using standard international 10-20 electrode placement. A total of 18 channels are recorded, including one for the EKG. The patient is intubated and sedated with propofol 50 mcg/kg/min. Therefore wakefulness is not recorded on this study.  Activating Maneuvers: None  Findings:  The EKG channel demonstrates a regular rhythm with a rate of 70 beats per minute.   The background consists of low voltage delta activity. There is superimposed theta activity with a lesser degree of superimposed alpha throughout the recording. Voltages are moderately reduced. This background is poorly reactive to stimulation.   There are no focal asymmetries. No epileptiform discharges are present. No seizures are recorded.    Impression: This is an abnormal EEG due to severe diffuse generalized slowing. This pattern is nonspecific but consistent with a global encephalopathic process. In this case, this could be explained by her sedation. There is no evidence of epileptiform discharges or ongoing seizure activity on this recording.    Melba Coon, MD [facility]

## 2016-07-01 NOTE — ED Notes (Signed)
1MG  ativan given IV

## 2016-07-01 NOTE — Progress Notes (Signed)
Bedside EEG completed; results pending. 

## 2016-07-01 NOTE — Progress Notes (Signed)
Right hand IV found during assessment.  Not previously documented.

## 2016-07-01 NOTE — Progress Notes (Signed)
Made Dr. Cyndy Freeze aware of patient's Right to left midline shift from recent MRI results. Will continue to monitor patient.

## 2016-07-01 NOTE — H&P (Signed)
PULMONARY / CRITICAL CARE MEDICINE   Name: Crystal Parsons MRN: [id number] DOB: [birth year]    ADMISSION DATE:  06/30/2016 CONSULTATION DATE:  11/30  REFERRING MD:  Dr. Ron Parker EDP resident  CHIEF COMPLAINT: Seizure  HISTORY OF PRESENT ILLNESS:  70 year old female with PMH significant for HTN and gout. She was recently diagnosed with gout and treated with colchicine, allopurinol, and a short course of prednisone. Since that time she had been improving. 11/28 overnight she had 2 seizures. She was seizing en route and in ED and was promptly intubated for airway protection. PCCM to see.    SUBJECTIVE: on vent, sychronous   VITAL SIGNS: BP (!) 130/117   Pulse 80   Temp 98.7 F (37.1 C) (Axillary)   Resp 16   Ht 5\' 4"  (1.626 m)   Wt 83.2 kg (183 lb 6.8 oz)   SpO2 100%   BMI 31.48 kg/m   HEMODYNAMICS:    VENTILATOR SETTINGS: Vent Mode: PRVC FiO2 (%):  [40 %-60 %] 50 % Set Rate:  [15 bmp-16 bmp] 16 bmp Vt Set:  [500 mL] 500 mL PEEP:  [5 cmH20] 5 cmH20 Plateau Pressure:  [18 cmH20-19 cmH20] 18 cmH20  INTAKE / OUTPUT: I/O last 3 completed shifts: In: 461.4 [I.V.:301.4; IV Piggyback:160] Out: 850 [Urine:850]  PHYSICAL EXAMINATION: General:  Overweight female in NAD on vent Neuro:  rass -2, FC yes, no focus overnight HEENT:  [place]/AT, PERRL, no JVD Cardiovascular:  s1 s2 RRR, no MRG Lungs:  Clear Abdomen:  Soft, non-distended, no r/g Musculoskeletal:  No acute deformity Skin:  Grossly intact  LABS:  BMET  Recent Labs Lab 06/30/16 2223 06/30/16 2306  NA 142 127*  K 3.2* 3.3*  CL 105 88*  CO2 12*  --   BUN 12 7  CREATININE 1.10* 0.70  GLUCOSE 184* 287*    Electrolytes  Recent Labs Lab 06/30/16 2223  CALCIUM 10.4*    CBC  Recent Labs Lab 06/30/16 2223 06/30/16 2306 07/01/16 0359  WBC 20.4*  --  16.8*  HGB 14.0 20.1* 12.2  HCT 42.1 59.0* 36.2  PLT 365  --  303    Coag's No results for input(s): APTT, INR in the last 168 hours.  Sepsis  Markers No results for input(s): LATICACIDVEN, PROCALCITON, O2SATVEN in the last 168 hours.  ABG  Recent Labs Lab 07/01/16 0013 07/01/16 0415  PHART 7.435 7.438  PCO2ART 31.4* 35.0  PO2ART 61.0* 165*    Liver Enzymes No results for input(s): AST, ALT, ALKPHOS, BILITOT, ALBUMIN in the last 168 hours.  Cardiac Enzymes No results for input(s): TROPONINI, PROBNP in the last 168 hours.  Glucose  Recent Labs Lab 06/30/16 2229 07/01/16 0418 07/01/16 0832  GLUCAP 163* 139* 121*    Imaging Ct Head Wo Contrast  Result Date: 07/01/2016 CLINICAL DATA:  New onset seizures EXAM: CT HEAD WITHOUT CONTRAST TECHNIQUE: Contiguous axial images were obtained from the base of the skull through the vertex without intravenous contrast. COMPARISON:  Brain MRI 08/09/2011 FINDINGS: Brain: There is a focal region of hypoattenuation within the right frontal lobe measuring 4.8 x 2.3 cm with multiple internal septations. There is minimal adjacent edema. There is a focal region of hyperattenuation more posteriorly within the right frontal white matter. There is minimal midline shift, measuring approximately 2 mm. No frank herniation. Basal cisterns are clearly patent. No hydrocephalus. No extra-axial collection. Vascular: Atherosclerotic calcification of the vertebral arteries at the skullbase. Skull: Normal. Negative for fracture or focal lesion. Sinuses/Orbits:  No acute finding. Other: None. IMPRESSION: 1. Mass lesion within the right frontal lobe measuring up to 4.8 cm. 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 However, a cerebral abscess may have a similar appearance. MRI with and without contrast is recommended for further characterization. 2. No significant mass effect or midline shift.  No hydrocephalus. Electronically Signed   By: Ulyses Jarred M.D.   On: 07/01/2016 00:23   Dg Chest Port 1 View  Result Date: 07/01/2016 CLINICAL DATA:  Acute onset of  shortness of breath. Initial encounter. EXAM: PORTABLE CHEST 1 VIEW COMPARISON:  Chest radiograph performed 10/15/2004 FINDINGS: The patient's endotracheal tube is seen ending 2-3 cm above the carina. The patient's enteric tube is noted extending below the diaphragm. Right perihilar airspace opacity raises concern for pneumonia. No pleural effusion or pneumothorax is seen. The heart is borderline normal in size. No acute osseous abnormalities are identified. IMPRESSION: 1. Endotracheal tube seen ending 2-3 cm above the carina. 2. Right perihilar airspace opacity raises concern for pneumonia. Followup PA and lateral chest X-ray is recommended in 3-4 weeks following trial of antibiotic therapy to ensure resolution and exclude underlying malignancy. Electronically Signed   By: Garald Balding M.D.   On: 07/01/2016 03:24   Dg Abd Portable 1v  Result Date: 07/01/2016 CLINICAL DATA:  OG tube placement EXAM: PORTABLE ABDOMEN - 1 VIEW COMPARISON:  None. FINDINGS: Esophageal tube tip projects over the proximal stomach. Upper bowel gas pattern is nonobstructed. IMPRESSION: Esophageal tube tip overlies the proximal stomach Electronically Signed   By: Donavan Foil M.D.   On: 07/01/2016 02:35   Dg Abd Portable 1v  Result Date: 07/01/2016 CLINICAL DATA:  Orogastric tube placement.  Initial encounter. EXAM: PORTABLE ABDOMEN - 1 VIEW COMPARISON:  None. FINDINGS: The patient's enteric tube is noted ending overlying the body of the stomach. The visualized bowel gas pattern is unremarkable. Scattered air and stool filled loops of colon are seen; no abnormal dilatation of small bowel loops is seen to suggest small bowel obstruction. No free intra-abdominal air is identified, though evaluation for free air is limited on a single supine view. The visualized osseous structures are within normal limits; the sacroiliac joints are unremarkable in appearance. The visualized lung bases are essentially clear. IMPRESSION: Enteric tube  noted ending overlying the body of the stomach. Electronically Signed   By: Garald Balding M.D.   On: 07/01/2016 02:19     STUDIES:  CT head 11/30 >>>Mass lesion within the right frontal lobe measuring up to 4.8 cm. 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 However, a cerebral abscess may have a similar appearance. MRI with and without contrast is recommended for further characterization. 2. No significant mass effect or midline shift.  No hydrocephalus MRi 12/1>>> CT chest 12/1>>> CULTURES:   ANTIBIOTICS:   SIGNIFICANT EVENTS: 11/28-11/30 seizures at home 11/30 to ED with seizures, intubated for status.  LINES/TUBES: ERR 11/30 >>>  DISCUSSION: 70 year old female with no history of seizures admitted 11/30 for 7 seizures in the last 24 hours. Seizing en route to, and in ER. Intubated for airway protection. PCCM to admit. CT head and neuro consult pending.   ASSESSMENT / PLAN:  PULMONARY A: Acute hypoxemic respiratory failure secondary to status epilepticus  Rt peri hilar airspace process P:   Ct chest, assess for mass vs aspiration ABG reviewed, consider reduction TV  likely SBT in afternoon,  no extubation planned  CARDIOVASCULAR A:  Hypertensive urgency  P:  Telemetry monitoring PRN labetalol to keep SBP < 130-160 wil hold this losartan/HCTZ with crt prior Allow pos balance  RENAL A:   Hypokalemia ?AKI, unable to establish baseline Hyponatremia vs error? R/o siadh if real  P:   Unsure if error chem, repeat bmet now bmet in am  Avoid abny free water If NA real, will correct in setting brain mass and edema Send UA, urine na, osm, serum osm now  GASTROINTESTINAL A:   No acute issues  P:   NPO Pepcid for SUP Start feeds after MRI if NOT going to OR, likley will go   HEMATOLOGIC A:   DV Tprevenention  P:  Follow CBC SCDsadd sub  Qh ep pending OR? MRI findings  INFECTIOUS A:   Leukocytosis.  Likely acute phase reactant, but possibly aspirated although no witnessed vomiting.   P:   Ct chest  ENDOCRINE A:   DM listed in history but on no meds for this. Hyperglycemic in ED  P:   CBG monitoring and SSI  NEUROLOGIC A:   Status epilepticus Brain mass x 2 P:   RASS goal: -1 Propofol gtt for sedation, wua PRN fentanyl for analgesia keppra Neuro eeg needed MRi brain   FAMILY  - Updates: updated daughter in room  - Inter-disciplinary family meet or Palliative Care meeting due by:  12/6  Ccm time 30 min   Lavon Paganini. Titus Mould, MD, [place] Pgr: [facility]

## 2016-07-01 NOTE — Progress Notes (Signed)
RT transported pt to and from 3M06 to CT then MRI. RT will continue to monitor.

## 2016-07-01 NOTE — ED Notes (Signed)
Wasted 1MG  ativan in sink by Cecille Aver RN and Karolee Stamps RN

## 2016-07-02 ENCOUNTER — Inpatient Hospital Stay (HOSPITAL_COMMUNITY): Payer: Medicare Other

## 2016-07-02 ENCOUNTER — Encounter (HOSPITAL_COMMUNITY): Payer: Self-pay

## 2016-07-02 LAB — CBC WITH DIFFERENTIAL/PLATELET
BASOS ABS: 0 10*3/uL (ref 0.0–0.1)
BASOS PCT: 0 %
Eosinophils Absolute: 0.2 10*3/uL (ref 0.0–0.7)
Eosinophils Relative: 2 %
HEMATOCRIT: 36.4 % (ref 36.0–46.0)
HEMOGLOBIN: 11.9 g/dL — AB (ref 12.0–15.0)
Lymphocytes Relative: 15 %
Lymphs Abs: 1.8 10*3/uL (ref 0.7–4.0)
MCH: 30.2 pg (ref 26.0–34.0)
MCHC: 32.7 g/dL (ref 30.0–36.0)
MCV: 92.4 fL (ref 78.0–100.0)
MONOS PCT: 10 %
Monocytes Absolute: 1.3 10*3/uL — ABNORMAL HIGH (ref 0.1–1.0)
NEUTROS ABS: 9.3 10*3/uL — AB (ref 1.7–7.7)
NEUTROS PCT: 73 %
Platelets: 250 10*3/uL (ref 150–400)
RBC: 3.94 MIL/uL (ref 3.87–5.11)
RDW: 13.4 % (ref 11.5–15.5)
WBC: 12.6 10*3/uL — ABNORMAL HIGH (ref 4.0–10.5)

## 2016-07-02 LAB — MAGNESIUM
MAGNESIUM: 2.2 mg/dL (ref 1.7–2.4)
Magnesium: 2.2 mg/dL (ref 1.7–2.4)

## 2016-07-02 LAB — BASIC METABOLIC PANEL
ANION GAP: 8 (ref 5–15)
BUN: 19 mg/dL (ref 6–20)
CALCIUM: 9 mg/dL (ref 8.9–10.3)
CO2: 22 mmol/L (ref 22–32)
Chloride: 113 mmol/L — ABNORMAL HIGH (ref 101–111)
Creatinine, Ser: 1.08 mg/dL — ABNORMAL HIGH (ref 0.44–1.00)
GFR, EST AFRICAN AMERICAN: 58 mL/min — AB (ref >60)
GFR, EST NON AFRICAN AMERICAN: 50 mL/min — AB (ref >60)
Glucose, Bld: 110 mg/dL — ABNORMAL HIGH (ref 65–99)
Potassium: 3.6 mmol/L (ref 3.5–5.1)
Sodium: 143 mmol/L (ref 135–145)

## 2016-07-02 LAB — GLUCOSE, CAPILLARY
GLUCOSE-CAPILLARY: 114 mg/dL — AB (ref 65–99)
Glucose-Capillary: 133 mg/dL — ABNORMAL HIGH (ref 65–99)
Glucose-Capillary: 99 mg/dL (ref 65–99)
Glucose-Capillary: 110 mg/dL — ABNORMAL HIGH (ref 65–99)
Glucose-Capillary: 99 mg/dL (ref 65–99)

## 2016-07-02 LAB — PHOSPHORUS
PHOSPHORUS: 4.6 mg/dL (ref 2.5–4.6)
Phosphorus: 3.7 mg/dL (ref 2.5–4.6)

## 2016-07-02 MED ORDER — PHENOL 1.4 % MT LIQD
OROMUCOSAL | Status: AC
  Filled 2016-07-02: qty 177

## 2016-07-02 MED ORDER — PHENOL 1.4 % MT LIQD
1.0000 | OROMUCOSAL | Status: DC | PRN
  Administered 2016-07-02: 1 via OROMUCOSAL

## 2016-07-02 MED ORDER — ORAL CARE MOUTH RINSE
15.0000 mL | Freq: Two times a day (BID) | OROMUCOSAL | Status: DC
  Administered 2016-07-03: 15 mL via OROMUCOSAL

## 2016-07-02 MED ORDER — CHLORHEXIDINE GLUCONATE 0.12 % MT SOLN
15.0000 mL | Freq: Two times a day (BID) | OROMUCOSAL | Status: DC
  Administered 2016-07-02 – 2016-07-03 (×2): 15 mL via OROMUCOSAL
  Filled 2016-07-02 (×2): qty 15

## 2016-07-02 NOTE — Procedures (Signed)
Extubation Procedure Note  Patient Details:   Name: NANDHINI SHWARTZ DOB: 26-Feb-1946 MRN: [id number]   Airway Documentation:     Evaluation  O2 sats: stable throughout Complications: No apparent complications Patient did tolerate procedure well. Bilateral Breath Sounds: Clear   Yes   Pt extubated to 4L [place] per MD order.  Pt placed on 4L [place] with sats of 100%.  RN at bedside.  Rt will continue to montior  Pierre Bali 07/02/2016, 1:08 PM

## 2016-07-02 NOTE — Progress Notes (Signed)
PULMONARY / CRITICAL CARE MEDICINE   Name: Crystal Parsons MRN: [id number] DOB: [birth year]    ADMISSION DATE:  06/30/2016 CONSULTATION DATE:  11/30  REFERRING MD:  Dr. Ron Parker EDP resident  CHIEF COMPLAINT: Seizure  HISTORY OF PRESENT ILLNESS:  70 year old female with PMH significant for HTN and gout. She was recently diagnosed with gout and treated with colchicine, allopurinol, and a short course of prednisone. Since that time she had been improving. 11/28 overnight she had 2 seizures. She was seizing en route and in ED and was promptly intubated for airway protection. PCCM to see.   SUBJECTIVE: on vent, sychronous  VITAL SIGNS: BP (!) 128/59   Pulse 73   Temp 100 F (37.8 C) (Axillary)   Resp 16   Ht 5\' 4"  (1.626 m)   Wt 186 lb 1.1 oz (84.4 kg)   SpO2 100%   BMI 31.94 kg/m   HEMODYNAMICS:    VENTILATOR SETTINGS: Vent Mode: PRVC FiO2 (%):  [40 %-50 %] 40 % Set Rate:  [16 bmp] 16 bmp Vt Set:  [440 mL] 440 mL PEEP:  [5 cmH20] 5 cmH20 Plateau Pressure:  [14 cmH20-17 cmH20] 14 cmH20  INTAKE / OUTPUT: I/O last 3 completed shifts: In: 2888.8 [I.V.:2568.8; IV Piggyback:320] Out: 2725 [Urine:2725]  PHYSICAL EXAMINATION: General:  Overweight female in NAD on vent Neuro:  RASS 0, FC yes, no focus overnight HEENT:  [place]/AT, PERRL, no JVD Cardiovascular:  s1 s2 RRR, no MRG Lungs:  Clear to auscultation Abdomen:  Soft, non-distended, no r/g Musculoskeletal:  No acute deformity Skin:  Grossly intact  LABS:  BMET  Recent Labs Lab 07/01/16 0359 07/01/16 1011 07/02/16 0444  NA 139 138 143  K 3.8 4.0 3.6  CL 106 107 113*  CO2 22 22 22   BUN 11 11 19   CREATININE 0.89 1.05* 1.08*  GLUCOSE 129* 122* 110*    Electrolytes  Recent Labs Lab 07/01/16 0359 07/01/16 1011 07/01/16 1812 07/02/16 0444  CALCIUM 9.3 9.4  --  9.0  MG 2.1 2.3 2.3 2.2  PHOS 4.2 4.4 5.0* 4.6    CBC  Recent Labs Lab 06/30/16 2223 06/30/16 2306 07/01/16 0359 07/02/16 0444  WBC 20.4*   --  16.8* 12.6*  HGB 14.0 20.1* 12.2 11.9*  HCT 42.1 59.0* 36.2 36.4  PLT 365  --  303 250    Coag's No results for input(s): APTT, INR in the last 168 hours.  Sepsis Markers No results for input(s): LATICACIDVEN, PROCALCITON, O2SATVEN in the last 168 hours.  ABG  Recent Labs Lab 07/01/16 0013 07/01/16 0415  PHART 7.435 7.438  PCO2ART 31.4* 35.0  PO2ART 61.0* 165*    Liver Enzymes No results for input(s): AST, ALT, ALKPHOS, BILITOT, ALBUMIN in the last 168 hours.  Cardiac Enzymes No results for input(s): TROPONINI, PROBNP in the last 168 hours.  Glucose  Recent Labs Lab 07/01/16 1122 07/01/16 1809 07/01/16 1951 07/01/16 2319 07/02/16 0336 07/02/16 0759  GLUCAP 121* 119* 113* 124* 133* 99    Imaging Ct Chest Wo Contrast  Result Date: 07/01/2016 CLINICAL DATA:  Evaluate right perihilar airspace opacity. EXAM: CT CHEST WITHOUT CONTRAST TECHNIQUE: Multidetector CT imaging of the chest was performed following the standard protocol without IV contrast. COMPARISON:  Chest x-ray 07/01/2016 FINDINGS: Chest wall: No breast masses, supraclavicular or axillary lymphadenopathy. Small scattered lymph nodes are noted. The thyroid gland is grossly normal. Endotracheal tube and NG tubes are in place. Cardiovascular: The heart is borderline enlarged. No pericardial effusion. The  aorta is normal in caliber. Mild tortuosity. No significant atherosclerotic calcifications. Coronary artery calcifications are noted. Mediastinum/Nodes: Small scattered mediastinal and hilar lymph nodes but no mass or overt adenopathy. The esophagus is grossly normal. Lungs/Pleura: Right upper lobe atelectasis adjacent to the major fissure and also adjacent to the suprahilar region. No masses identified. Very small pleural effusions and streaky areas of subsegmental atelectasis in both lower lung zones. No definite pneumonia. No pneumothorax. Upper Abdomen: No significant upper abdominal findings. Musculoskeletal:  No significant bony findings. IMPRESSION: 1. Right parahilar density appears to be atelectasis or possible infiltrate but no mass is identified. There is also streaky bibasilar subsegmental atelectasis and very small pleural effusions. 2. No mediastinal or hilar mass or adenopathy. Scattered lymph nodes are noted. Electronically Signed   By: Marijo Sanes M.D.   On: 07/01/2016 16:29   Mr Brain W And Wo Contrast  Result Date: 07/01/2016 CLINICAL DATA:  Seizure, follow-up RIGHT frontal lobe mass. History of hypertension and diabetes. EXAM: MRI HEAD WITHOUT AND WITH CONTRAST TECHNIQUE: Multiplanar, multiecho pulse sequences of the brain and surrounding structures were obtained without and with intravenous contrast. CONTRAST:  44mL MULTIHANCE GADOBENATE DIMEGLUMINE 529 MG/ML IV SOLN COMPARISON:  CT HEAD June 30, 2016 and MRI head August 09, 2011 FINDINGS: Multiple sequences are moderately or severely motion degraded. INTRACRANIAL CONTENTS: RIGHT frontal lobe cystic and solid 3 x 4.3 x 4.5 cm (AP by transverse by CC) mass with thickened trabecula, enhancing multinodular solid components and sub cm focus of associated reduced diffusion consistent with hypercellularity. No definite susceptibility artifact to suggest hemorrhage. Mild T2 bright surrounding signal extends through the genu of the corpus callosum. 2.2 cm posterior to the mass is a second enhancing cystic and solid sub cm mass with increased surrounding enhancement suggesting parasitic vessels. Surrounding FLAIR T2 hyperintense signal. RIGHT frontal mass effect resulting in 2 mm RIGHT to LEFT subfalcine herniation. No atrophy for age. Patchy supratentorial and pontine white matter T2 hyperintensities exclusive of the aforementioned abnormality consistent with chronic small vessel ischemic disease. No abnormal extra-axial fluid collections, extra-axial enhancement or masses. VASCULAR: Normal major intracranial vascular flow voids present at skull base.  SKULL AND UPPER CERVICAL SPINE: No abnormal sellar expansion. No suspicious calvarial bone marrow signal. Craniocervical junction maintained. SINUSES/ORBITS: The mastoid air-cells and included paranasal sinuses are well-aerated.The included ocular globes and orbital contents are non-suspicious. OTHER: Secretions in the nasopharynx, life-support lines in place. IMPRESSION: Motion degraded examination. 3 x 4.3 x 4.5 cm complex RIGHT frontal lobe mass with imaging characteristics of primary brain tumor. 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 Local edema versus nonenhancing infiltrative tumor results in 2 mm RIGHT to LEFT midline shift. No ventricular entrapment. These results will be called to the ordering clinician or representative by the Radiologist Assistant, and communication documented in the [facility] Dashboard Electronically Signed   By: Elon Alas M.D.   On: 07/01/2016 16:53     STUDIES:  CT head 11/30 >>>Mass lesion within the right frontal lobe measuring up to 4.8 cm. 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 However, a cerebral abscess may have a similar appearance. MRI with and without contrast is recommended for further characterization. 2. No significant mass effect or midline shift.  No hydrocephalus MRi 12/1>>>as noted CT chest 12/1>>>neg CULTURES:   ANTIBIOTICS:   SIGNIFICANT EVENTS: 11/28-11/30 seizures at home 11/30 to ED with seizures, intubated for status.  LINES/TUBES: ERR 11/30 >>>  DISCUSSION: 70 year old female with no history of seizures admitted 11/30 for 7 seizures in the last 24 hours. Seizing en route to, and in ER. Intubated for airway protection. PCCM to admit. NS following  ASSESSMENT / PLAN:  PULMONARY A: Acute hypoxemic respiratory failure secondary to status epilepticus  Rt peri hilar airspace process P:   Ct chest, assess for mass vs  aspiration, negative  ABG reviewed, consider reduction TV  likely SBT in afternoon, no extubation planned  CARDIOVASCULAR A:  Hypertensive urgency  P:  Telemetry monitoring PRN labetalol to keep SBP < 130-160 wil hold this losartan/HCTZ with crt prior Allow pos balance  RENAL Lab Results  Component Value Date   CREATININE 1.08 (H) 07/02/2016   CREATININE 1.05 (H) 07/01/2016   CREATININE 0.89 07/01/2016    Recent Labs Lab 07/01/16 0359 07/01/16 1011 07/02/16 0444  K 3.8 4.0 3.6    Recent Labs Lab 07/01/16 0359 07/01/16 1011 07/02/16 0444  NA 139 138 143      A:    ?AKI, unable to establish baseline   P:   Unsure if error chem, repeat bmet now bmet in am  Avoid abny free water If NA real, will correct in setting brain mass and edema Send UA, urine na, osm, serum osm now  GASTROINTESTINAL A:   No acute issues  P:   NPO Pepcid for SUP Start feeds after MRI if NOT going to OR, likley will go   HEMATOLOGIC A:   DVT prevenention  P:  Follow CBC SCDsadd sub  Qh ep pending OR? MRI findings  INFECTIOUS A:   Leukocytosis. Likely acute phase reactant, but possibly aspirated although no witnessed vomiting.   P:   Ct chest neg  ENDOCRINE A:   DM listed in history but on no meds for this. Hyperglycemic in ED  P:   CBG monitoring and SSI  NEUROLOGIC A:   Status epilepticus Brain mass x 2 P:   RASS goal: -1 Propofol gtt for sedation, wua PRN fentanyl for analgesia keppra Neuro eeg needed MRi brain with rt frontal 3x4.3x 4.5 mass. NS following Abn eeg   FAMILY  - Updates: updated husband in room  - Inter-disciplinary family meet or Palliative Care meeting due by:  12/6  Ccm time 30 min   Richardson Landry Minor ACNP Maryanna Shape PCCM Pager [phone] till 3 pm If no answer page [phone] 07/02/2016, 8:11 AM  Attending Note:  70 year old female with brain mass who presents in status and was intubated.  Surgery is not until Tuesday.  On exam,  lungs are clear and patient is able to follow commands but remains lethargic.  I reviewed CXR myself, ETT ok, lungs are clear.  Discussed with NS-MD.  Will stop sedation at this point.  Once more awake then will likely extubate.  Keep patient in the ICU however given surgical needs and seizure.  Continue Keppra.  The patient is critically ill with multiple organ systems failure and requires high complexity decision making for assessment and support, frequent evaluation and titration of therapies, application of advanced monitoring technologies and extensive interpretation of multiple databases.   Critical Care Time devoted to patient care services described in this note is  35  Minutes. This time reflects time of care of this signee Dr Jennet Maduro. This critical care time does not reflect procedure time, or teaching time or supervisory time of PA/NP/Med student/Med Resident etc but could involve care discussion time.  Rush Farmer, M.D. [facility]. Pager: [phone]. After hours pager: [phone].

## 2016-07-02 NOTE — Progress Notes (Signed)
No acute events Intubated and sedated [place] Follows commands bilaterally Would extubate if medically stable Surgery not until the beginning of the week

## 2016-07-03 ENCOUNTER — Inpatient Hospital Stay (HOSPITAL_COMMUNITY): Payer: Medicare Other

## 2016-07-03 DIAGNOSIS — E1310 Other specified diabetes mellitus with ketoacidosis without coma: Secondary | ICD-10-CM

## 2016-07-03 LAB — GLUCOSE, CAPILLARY
GLUCOSE-CAPILLARY: 101 mg/dL — AB (ref 65–99)
GLUCOSE-CAPILLARY: 105 mg/dL — AB (ref 65–99)
GLUCOSE-CAPILLARY: 107 mg/dL — AB (ref 65–99)
GLUCOSE-CAPILLARY: 91 mg/dL (ref 65–99)
Glucose-Capillary: 88 mg/dL (ref 65–99)
Glucose-Capillary: 124 mg/dL — ABNORMAL HIGH (ref 65–99)
Glucose-Capillary: 104 mg/dL — ABNORMAL HIGH (ref 65–99)

## 2016-07-03 LAB — BASIC METABOLIC PANEL
Anion gap: 9 (ref 5–15)
BUN: 14 mg/dL (ref 6–20)
CALCIUM: 9.2 mg/dL (ref 8.9–10.3)
CHLORIDE: 114 mmol/L — AB (ref 101–111)
CO2: 21 mmol/L — ABNORMAL LOW (ref 22–32)
CREATININE: 0.91 mg/dL (ref 0.44–1.00)
GFR calc non Af Amer: >60 mL/min (ref >60)
Glucose, Bld: 108 mg/dL — ABNORMAL HIGH (ref 65–99)
Potassium: 3.9 mmol/L (ref 3.5–5.1)
SODIUM: 144 mmol/L (ref 135–145)

## 2016-07-03 LAB — CBC
HCT: 35.8 % — ABNORMAL LOW (ref 36.0–46.0)
Hemoglobin: 11.7 g/dL — ABNORMAL LOW (ref 12.0–15.0)
MCH: 30.3 pg (ref 26.0–34.0)
MCHC: 32.7 g/dL (ref 30.0–36.0)
MCV: 92.7 fL (ref 78.0–100.0)
PLATELETS: 239 10*3/uL (ref 150–400)
RBC: 3.86 MIL/uL — AB (ref 3.87–5.11)
RDW: 13.1 % (ref 11.5–15.5)
WBC: 14.3 10*3/uL — AB (ref 4.0–10.5)

## 2016-07-03 LAB — TRIGLYCERIDES: Triglycerides: 145 mg/dL (ref <150)

## 2016-07-03 MED ORDER — PRAVASTATIN SODIUM 40 MG PO TABS
40.0000 mg | ORAL_TABLET | Freq: Every day | ORAL | Status: DC
  Administered 2016-07-03 – 2016-07-10 (×6): 40 mg via ORAL
  Filled 2016-07-03 (×6): qty 1

## 2016-07-03 MED ORDER — ORAL CARE MOUTH RINSE
15.0000 mL | Freq: Two times a day (BID) | OROMUCOSAL | Status: DC
Start: 2016-07-03 — End: 2016-07-11
  Administered 2016-07-03 – 2016-07-11 (×14): 15 mL via OROMUCOSAL

## 2016-07-03 MED ORDER — COLCHICINE 0.6 MG PO TABS
0.6000 mg | ORAL_TABLET | Freq: Every day | ORAL | Status: DC
  Administered 2016-07-03 – 2016-07-05 (×3): 0.6 mg via ORAL
  Filled 2016-07-03 (×3): qty 1

## 2016-07-03 MED ORDER — ADULT MULTIVITAMIN W/MINERALS CH
1.0000 | ORAL_TABLET | Freq: Every day | ORAL | Status: DC
  Administered 2016-07-03 – 2016-07-11 (×8): 1 via ORAL
  Filled 2016-07-03 (×8): qty 1

## 2016-07-03 MED ORDER — VITAMIN D 1000 UNITS PO TABS
2000.0000 [IU] | ORAL_TABLET | Freq: Every day | ORAL | Status: DC
  Administered 2016-07-03 – 2016-07-11 (×8): 2000 [IU] via ORAL
  Filled 2016-07-03 (×10): qty 2

## 2016-07-03 MED ORDER — FAMOTIDINE 20 MG PO TABS
20.0000 mg | ORAL_TABLET | Freq: Two times a day (BID) | ORAL | Status: DC
  Administered 2016-07-03 – 2016-07-11 (×15): 20 mg via ORAL
  Filled 2016-07-03 (×15): qty 1

## 2016-07-03 MED ORDER — ALLOPURINOL 300 MG PO TABS
300.0000 mg | ORAL_TABLET | Freq: Every day | ORAL | Status: DC
  Administered 2016-07-03 – 2016-07-06 (×4): 300 mg via ORAL
  Filled 2016-07-03 (×4): qty 1

## 2016-07-03 MED ORDER — HYDROCHLOROTHIAZIDE 12.5 MG PO CAPS
12.5000 mg | ORAL_CAPSULE | Freq: Every day | ORAL | Status: DC
  Administered 2016-07-03 – 2016-07-11 (×8): 12.5 mg via ORAL
  Filled 2016-07-03 (×8): qty 1

## 2016-07-03 MED ORDER — LOSARTAN POTASSIUM 50 MG PO TABS
50.0000 mg | ORAL_TABLET | Freq: Every day | ORAL | Status: DC
  Administered 2016-07-03 – 2016-07-11 (×8): 50 mg via ORAL
  Filled 2016-07-03 (×9): qty 1

## 2016-07-03 NOTE — Evaluation (Signed)
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  pt with hx of HTN, Gout, and DM.    Clinical Impression  Pt pleasantly confused and agreeable to mobility.  Pt only oriented to self and has difficulty retaining orientation information ~6 mins after PT oriented pt.  Pt indicates feeling like she has Gout in her L foot, RN aware.  Noted pt to go to OR possibly Tuesday of this week.  Will continue to follow along to A with mobility and D/C planning, but anticipate pt would benefit from CIR level of therapies post-op.  Please write new orders for PT and mobility post-op.      Follow Up Recommendations CIR    Equipment Recommendations  None recommended by PT    Recommendations for Other Services Rehab consult     Precautions / Restrictions Precautions Precautions: Fall Restrictions Weight Bearing Restrictions: No      Mobility  Bed Mobility               General bed mobility comments: pt in recliner  Transfers Overall transfer level: Needs assistance Equipment used: Rolling walker (2 wheeled) Transfers: Sit to/from Stand Sit to Stand: Mod assist         General transfer comment: A for power up to standing and cues for safe technique.    Ambulation/Gait Ambulation/Gait assistance: Mod assist Ambulation Distance (Feet): 20 Feet Assistive device: Rolling walker (2 wheeled) Gait Pattern/deviations: Step-through pattern;Decreased stride length     General Gait Details: Consistent A for management of RW and for maintaining balance during turns or when distracted.    Stairs            Wheelchair Mobility    Modified Rankin (Stroke Patients Only)       Balance Overall balance assessment: Needs assistance Sitting-balance support: No upper extremity  supported;Feet supported Sitting balance-Leahy Scale: Fair     Standing balance support: Bilateral upper extremity supported;During functional activity Standing balance-Leahy Scale: Poor                               Pertinent Vitals/Pain Pain Assessment: Faces Faces Pain Scale: Hurts little more Pain Location: L foot Pain Descriptors / Indicators: Grimacing;Sore Pain Intervention(s): Monitored during session;Premedicated before session;Repositioned    Home Living Family/patient expects to be discharged to:: Unsure Living Arrangements: Spouse/significant other                    Prior Function Level of Independence: Independent               Hand Dominance        Extremity/Trunk Assessment   Upper Extremity Assessment: Defer to OT evaluation           Lower Extremity Assessment: Generalized weakness;LLE deficits/detail   LLE Deficits / Details: Family indicate hx of ankle fx and pt c/o feeling like gout in her foot.    Cervical / Trunk Assessment: Normal  Communication   Communication: Expressive difficulties (word finding)  Cognition Arousal/Alertness: Awake/alert Behavior During Therapy: WFL for tasks assessed/performed Overall Cognitive Status: Impaired/Different from baseline Area of Impairment: Orientation;Attention;Memory;Following commands;Safety/judgement;Awareness;Problem solving Orientation Level: Disoriented to;Place;Time;Situation Current Attention Level: Sustained Memory: Decreased recall of precautions;Decreased short-term memory Following Commands: Follows one step commands  with increased time;Follows multi-step commands inconsistently Safety/Judgement: Decreased awareness of safety;Decreased awareness of deficits Awareness: Intellectual Problem Solving: Slow processing;Decreased initiation;Difficulty sequencing;Requires verbal cues;Requires tactile cues General Comments: pt not able to pick location of "hospital" out of a  list of 3 options.  Once PT oriented pt, pt unable to retain information ~67mins later, but then was able to pick hospital out of options.  pt seems to have word finding deificits as well.      General Comments      Exercises     Assessment/Plan    PT Assessment Patient needs continued PT services  PT Problem List Decreased strength;Decreased activity tolerance;Decreased balance;Decreased mobility;Decreased coordination;Decreased cognition;Decreased knowledge of use of DME;Decreased safety awareness;Pain          PT Treatment Interventions DME instruction;Gait training;Stair training;Functional mobility training;Therapeutic activities;Therapeutic exercise;Balance training;Patient/family education    PT Goals (Current goals can be found in the Care Plan section)  Acute Rehab PT Goals Patient Stated Goal: Per family for pt to regain independence PT Goal Formulation: With patient/family Time For Goal Achievement: 07/17/16 Potential to Achieve Goals: Good    Frequency Min 3X/week   Barriers to discharge        Co-evaluation               End of Session Equipment Utilized During Treatment: Gait belt Activity Tolerance: Patient tolerated treatment well Patient left: in chair;with call bell/phone within reach;with chair alarm set Nurse Communication: Mobility status         Time: [id number] PT Time Calculation (min) (ACUTE ONLY): 28 min   Charges:   PT Evaluation $PT Eval Moderate Complexity: 1 Procedure PT Treatments $Gait Training: 8-22 mins   PT G CodesCatarina Hartshorn, [place]  [phone] 07/03/2016, 12:16 PM

## 2016-07-03 NOTE — Progress Notes (Signed)
PULMONARY / CRITICAL CARE MEDICINE   Name: Crystal Parsons MRN: [id number] DOB: [birth year]    ADMISSION DATE:  06/30/2016 CONSULTATION DATE:  11/30  REFERRING MD:  Dr. Ron Parker EDP resident  CHIEF COMPLAINT: Seizure  HISTORY OF PRESENT ILLNESS:  70 year old female with PMH significant for HTN and gout. She was recently diagnosed with gout and treated with colchicine, allopurinol, and a short course of prednisone. Since that time she had been improving. 11/28 overnight she had 2 seizures. She was seizing en route and in ED and was promptly intubated for airway protection. PCCM to see.   SUBJECTIVE: Sitting in chair, no events overnight  VITAL SIGNS: BP 139/70 (BP Location: Right Arm)   Pulse 85   Temp 98.9 F (37.2 C) (Oral)   Resp 16   Ht 5\' 4"  (1.626 m)   Wt 186 lb 1.1 oz (84.4 kg)   SpO2 100%   BMI 31.94 kg/m   HEMODYNAMICS:    VENTILATOR SETTINGS: Vent Mode: CPAP;PSV FiO2 (%):  [40 %] 40 % PEEP:  [5 cmH20] 5 cmH20 Pressure Support:  [5 cmH20] 5 cmH20  INTAKE / OUTPUT: I/O last 3 completed shifts: In: 3493.3 [I.V.:3163.3; IV Piggyback:330] Out: [id number] [Urine:3630; Emesis/NG output:45]  PHYSICAL EXAMINATION: General:  Overweight female in NAD sitting in chair Neuro:  RASS 1, follows commands and is orientated HEENT:  [place]/AT, PERRL, no JVD Cardiovascular:  s1 s2 RRR, no MRG Lungs:  Clear to auscultation Abdomen:  Soft, non-distended, no r/g Musculoskeletal:  No acute deformity Skin:  Grossly intact  LABS:  BMET  Recent Labs Lab 07/01/16 1011 07/02/16 0444 07/03/16 0534  NA 138 143 144  K 4.0 3.6 3.9  CL 107 113* 114*  CO2 22 22 21*  BUN 11 19 14   CREATININE 1.05* 1.08* 0.91  GLUCOSE 122* 110* 108*    Electrolytes  Recent Labs Lab 07/01/16 1011 07/01/16 1812 07/02/16 0444 07/02/16 1613 07/03/16 0534  CALCIUM 9.4  --  9.0  --  9.2  MG 2.3 2.3 2.2 2.2  --   PHOS 4.4 5.0* 4.6 3.7  --     CBC  Recent Labs Lab 07/01/16 0359 07/02/16 0444  07/03/16 0534  WBC 16.8* 12.6* 14.3*  HGB 12.2 11.9* 11.7*  HCT 36.2 36.4 35.8*  PLT 303 250 239    Coag's No results for input(s): APTT, INR in the last 168 hours.  Sepsis Markers No results for input(s): LATICACIDVEN, PROCALCITON, O2SATVEN in the last 168 hours.  ABG  Recent Labs Lab 07/01/16 0013 07/01/16 0415  PHART 7.435 7.438  PCO2ART 31.4* 35.0  PO2ART 61.0* 165*    Liver Enzymes No results for input(s): AST, ALT, ALKPHOS, BILITOT, ALBUMIN in the last 168 hours.  Cardiac Enzymes No results for input(s): TROPONINI, PROBNP in the last 168 hours.  Glucose  Recent Labs Lab 07/02/16 1201 07/02/16 1534 07/02/16 1940 07/03/16 0008 07/03/16 0420 07/03/16 0741  GLUCAP 110* 114* 99 107* 101* 105*    Imaging Dg Chest Port 1 View  Result Date: 07/03/2016 CLINICAL DATA:  Respiratory failure EXAM: PORTABLE CHEST 1 VIEW COMPARISON:  July 02, 2016 FINDINGS: ET and NG tubes have been removed. Mild increased interstitial markings in the lungs. No other changes. IMPRESSION: Removal of support apparatus. Suggested mild pulmonary venous congestion. Electronically Signed   By: Dorise Bullion III M.D   On: 07/03/2016 07:29     STUDIES:  CT head 11/30 >>>Mass lesion within the right frontal lobe measuring up to 4.8 cm.  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 However, a cerebral abscess may have a similar appearance. MRI with and without contrast is recommended for further characterization. 2. No significant mass effect or midline shift.  No hydrocephalus MRi 12/1>>>as noted CT chest 12/1>>>neg CULTURES:   ANTIBIOTICS:   SIGNIFICANT EVENTS: 11/28-11/30 seizures at home 11/30 to ED with seizures, intubated for status.  LINES/TUBES: ERR 11/30 >>>12/2  DISCUSSION: 70 year old female with no history of seizures admitted 11/30 for 7 seizures in the last 24 hours. Seizing en route to, and in ER. Intubated for  airway protection. PCCM to admit. NS following  ASSESSMENT / PLAN:  PULMONARY A: Acute hypoxemic respiratory failure secondary to status epilepticus  Rt peri hilar airspace process P:   Ct chest, assess for mass vs aspiration, negative  ABG reviewed, consider reduction TV  Extubated 12/2 NAD Consider tx to floor till surgery planned per neuro  CARDIOVASCULAR A:  Hypertensive urgency  P:  Telemetry monitoring PRN labetalol to keep SBP < 130-160 wil hold this losartan/HCTZ with crt prior Allow pos balance  RENAL Lab Results  Component Value Date   CREATININE 0.91 07/03/2016   CREATININE 1.08 (H) 07/02/2016   CREATININE 1.05 (H) 07/01/2016    Recent Labs Lab 07/01/16 1011 07/02/16 0444 07/03/16 0534  K 4.0 3.6 3.9    Recent Labs Lab 07/01/16 1011 07/02/16 0444 07/03/16 0534  NA 138 143 144      A:    ?AKI, unable to establish baseline   P:   Follow labs  GASTROINTESTINAL A:   No acute issues  P:   Advance diet  HEMATOLOGIC A:   DVT prevenention  P:  Follow CBC SCDsadd sub  Qh ep pending OR? MRI findings  INFECTIOUS A:   Leukocytosis. Likely acute phase reactant, but possibly aspirated although no witnessed vomiting.   P:   Ct chest neg  ENDOCRINE CBG (last 3)   Recent Labs  07/03/16 0008 07/03/16 0420 07/03/16 0741  GLUCAP 107* 101* 105*     A:   DM listed in history but on no meds for this. Hyperglycemic in ED  P:   CBG monitoring and SSI  NEUROLOGIC A:   Status epilepticus Brain mass x 2 P:   RASS goal: 0-1 keppra Neuro seeing eeg needed MRi brain with rt frontal 3x4.3x 4.5 mass. NS following Abn eeg   FAMILY  - Updates: updated husband in room 12/2  - Inter-disciplinary family meet or Palliative Care meeting due by:  12/6  Ccm time 30 min   Richardson Landry Minor ACNP Maryanna Shape PCCM Pager [phone] till 3 pm If no answer page [phone] 07/03/2016, 9:09 AM  Attending Note:  70 year old female with brain  mass who presents in status epilepticus and was intubated now extubated.  Surgery is not until Tuesday.  On exam, lungs are clear and patient is able to follow commands with clear lungs.  I reviewed CXR myself, lungs are clear.  Discussed with PCCM-NP and bedside RN.  Acute respiratory failure due to inability to protect airway, now resolved  - Monitor for airway protection.  - Monitor for seizure and post ictal state again.  Seizure: due to mass  - Keppra  - Monitor.  Brain mass:  - Surgery on Tuesday for biopsy/excision.  DM:  - ISS  - CBG  Essential hypertension:  - Labetalol  - Tele monitoring  Hold in the ICU til surgery.  PCCM will continue to follow.  Patient seen and examined, agree with above note.  I dictated the care and orders written for this patient under my direction.  Rush Farmer, M.D. [facility] Pulmonary/Critical Care Medicine. Pager: [phone]. After hours pager: [phone].

## 2016-07-03 NOTE — Evaluation (Signed)
Clinical/Bedside Swallow Evaluation Patient Details  Name: AKEVIA MIESNER MRN: [id number] Date of Birth: 12/24/1945  Today's Date: 07/03/2016 Time: SLP Start Time (ACUTE ONLY): 1533 SLP Stop Time (ACUTE ONLY): 1553 SLP Time Calculation (min) (ACUTE ONLY): 20 min  Past Medical History:  Past Medical History:  Diagnosis Date  . Anxiety   . Bradycardia    At times with pulse in the 40s  . Diabetes ([place])   . Diarrhea    With blating, improved with gluten-free diet  . Diastolic dysfunction   . Essential hypertension, benign    Always has HTN when at the doctor's office.  . Hyperlipidemia   . 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 Mitral valve problem    Mildly thickened mitral valve  . Mitral valve prolapse    Mild, anterior  . MR (mitral regurgitation)    ECHO 08/10/09 shows mild MR again and normal EF. No significant changes from prior ECHO.  Marland Kitchen Palpitations    Occasional, but are not significant. ECHO 08/20/08 - Normal EF (60%), mildly thickened mitral valve with mild anterior mitral valve prolapse, mild MR, mild aortic sclerosis, grade 1 diastolic dysfunction.  . Proteinuria    Likely secondary to Diabetes   Past Surgical History:  Past Surgical History:  Procedure Laterality Date  . ABDOMINAL HYSTERECTOMY    . BREAST LUMPECTOMY Left   . HEMORROIDECTOMY    . TUBAL LIGATION     HPI:  70 year old female with PMH significant for HTN, DM, diastolic dysfunction, HTN and gout. Pt admitted with 2 seizures and found to have R Frontal Mass with plans for surgical intervention. Intubated 11/30-12/2.     Assessment / Plan / Recommendation Clinical Impression  Pt with mod odonophagia post intubation. Immediate cough with straw sip thin likely due to increased velocity and volume. Solid texture functional but recommend Dys 3 due to lingual bruising after biting during surgery and general lethargy. Pt noted to cough as SLP outside room documenting. Pills whole in applesauce and no straws. Will  follow.     Aspiration Risk  Moderate aspiration risk    Diet Recommendation Dysphagia 3 (Mech soft);Thin liquid   Liquid Administration via: Cup;No straw Medication Administration: Whole meds with puree Supervision: Patient able to self feed;Staff to assist with self feeding Compensations: Slow rate;Small sips/bites Postural Changes: Seated upright at 90 degrees    Other  Recommendations Oral Care Recommendations: Oral care BID   Follow up Recommendations  (TBD)      Frequency and Duration min 2x/week  2 weeks       Prognosis Prognosis for Safe Diet Advancement: Good      Swallow Study   General HPI: 70 year old female with PMH significant for HTN, DM, diastolic dysfunction, HTN and gout. Pt admitted with 2 seizures and found to have R Frontal Mass with plans for surgical intervention. Intubated 11/30-12/2.   Type of Study: Bedside Swallow Evaluation Previous Swallow Assessment:  (none) Diet Prior to this Study: NPO Temperature Spikes Noted: No Respiratory Status: Room air History of Recent Intubation: Yes Length of Intubations (days):  (2 1/2 days) Date extubated: 07/02/16 Behavior/Cognition: Alert;Cooperative;Pleasant mood Oral Cavity Assessment: Within Functional Limits Oral Care Completed by SLP: Yes Oral Cavity - Dentition: Adequate natural dentition Vision: Functional for self-feeding Self-Feeding Abilities: Needs assist;Needs set up Patient Positioning: Upright in bed Baseline Vocal Quality: Low vocal intensity Volitional Cough: Strong Volitional Swallow: Able to elicit    Oral/Motor/Sensory Function Overall Oral Motor/Sensory Function: Generalized  oral weakness   Ice Chips Ice chips: Not tested   Thin Liquid Thin Liquid: Impaired Presentation: Straw;Cup Pharyngeal  Phase Impairments: Cough - Immediate    Nectar Thick Nectar Thick Liquid: Not tested   Honey Thick Honey Thick Liquid: Not tested   Puree Puree: Within functional limits   Solid   GO    Solid: Within functional limits        Houston Siren 07/03/2016,4:02 PM  Orbie Pyo Colvin Caroli.Ed [employer] [phone]

## 2016-07-03 NOTE — Progress Notes (Signed)
Extubated uneventfully Awake and alert Moving all extremities well No drift Stable Dr. Kathyrn Sheriff to plan surgery

## 2016-07-03 NOTE — Progress Notes (Signed)
Rehab Admissions Coordinator Note:  Patient was screened by Retta Diones for appropriateness for an Inpatient Acute Rehab Consult.  At this time, we are recommending Inpatient Rehab consult.  Noted surgery pending in the next couple days.  Can order a rehab consult postop if desired.  Retta Diones 07/03/2016, 4:12 PM  I can be reached at [phone].

## 2016-07-04 ENCOUNTER — Other Ambulatory Visit: Payer: Self-pay | Admitting: Neurosurgery

## 2016-07-04 ENCOUNTER — Encounter (HOSPITAL_COMMUNITY): Payer: Self-pay

## 2016-07-04 ENCOUNTER — Inpatient Hospital Stay (HOSPITAL_COMMUNITY): Payer: Medicare Other

## 2016-07-04 LAB — GLUCOSE, CAPILLARY
GLUCOSE-CAPILLARY: 97 mg/dL (ref 65–99)
GLUCOSE-CAPILLARY: 94 mg/dL (ref 65–99)
Glucose-Capillary: 111 mg/dL — ABNORMAL HIGH (ref 65–99)
Glucose-Capillary: 120 mg/dL — ABNORMAL HIGH (ref 65–99)
Glucose-Capillary: 92 mg/dL (ref 65–99)
Glucose-Capillary: 94 mg/dL (ref 65–99)

## 2016-07-04 LAB — BASIC METABOLIC PANEL
ANION GAP: 9 (ref 5–15)
BUN: 15 mg/dL (ref 6–20)
CHLORIDE: 109 mmol/L (ref 101–111)
CO2: 21 mmol/L — AB (ref 22–32)
Calcium: 9 mg/dL (ref 8.9–10.3)
Creatinine, Ser: 0.83 mg/dL (ref 0.44–1.00)
GFR calc non Af Amer: >60 mL/min (ref >60)
GLUCOSE: 103 mg/dL — AB (ref 65–99)
POTASSIUM: 3.4 mmol/L — AB (ref 3.5–5.1)
Sodium: 139 mmol/L (ref 135–145)

## 2016-07-04 LAB — CBC
HEMATOCRIT: 33.2 % — AB (ref 36.0–46.0)
HEMOGLOBIN: 11 g/dL — AB (ref 12.0–15.0)
MCH: 30.3 pg (ref 26.0–34.0)
MCHC: 33.1 g/dL (ref 30.0–36.0)
MCV: 91.5 fL (ref 78.0–100.0)
Platelets: 244 10*3/uL (ref 150–400)
RBC: 3.63 MIL/uL — AB (ref 3.87–5.11)
RDW: 12.9 % (ref 11.5–15.5)
WBC: 11.4 10*3/uL — ABNORMAL HIGH (ref 4.0–10.5)

## 2016-07-04 MED ORDER — POTASSIUM CHLORIDE CRYS ER 20 MEQ PO TBCR
40.0000 meq | EXTENDED_RELEASE_TABLET | Freq: Once | ORAL | Status: AC
  Administered 2016-07-04: 40 meq via ORAL
  Filled 2016-07-04: qty 2

## 2016-07-04 MED ORDER — POTASSIUM CHLORIDE CRYS ER 20 MEQ PO TBCR
20.0000 meq | EXTENDED_RELEASE_TABLET | ORAL | Status: AC
  Administered 2016-07-04: 20 meq via ORAL
  Filled 2016-07-04: qty 1

## 2016-07-04 NOTE — Evaluation (Signed)
Speech Language Pathology Evaluation Patient Details Name: Crystal Parsons MRN: [id number] DOB: 1946-02-07 Today's Date: 07/04/2016 Time: [id number] SLP Time Calculation (min) (ACUTE ONLY): 12 min  Problem List:  Patient Active Problem List   Diagnosis Date Noted  . Acute respiratory failure with hypoxia ([place])   . Brain mass   . Seizure ([place]) 06/30/2016  . Status epilepticus ([place]) 06/30/2016   Past Medical History:  Past Medical History:  Diagnosis Date  . Anxiety   . Bradycardia    At times with pulse in the 40s  . Diabetes ([place])   . Diarrhea    With blating, improved with gluten-free diet  . Diastolic dysfunction   . Essential hypertension, benign    Always has HTN when at the doctor's office.  . Hyperlipidemia   . 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 Mitral valve problem    Mildly thickened mitral valve  . Mitral valve prolapse    Mild, anterior  . MR (mitral regurgitation)    ECHO 08/10/09 shows mild MR again and normal EF. No significant changes from prior ECHO.  Marland Kitchen Palpitations    Occasional, but are not significant. ECHO 08/20/08 - Normal EF (60%), mildly thickened mitral valve with mild anterior mitral valve prolapse, mild MR, mild aortic sclerosis, grade 1 diastolic dysfunction.  . Proteinuria    Likely secondary to Diabetes   Past Surgical History:  Past Surgical History:  Procedure Laterality Date  . ABDOMINAL HYSTERECTOMY    . BREAST LUMPECTOMY Left   . HEMORROIDECTOMY    . TUBAL LIGATION     HPI:  70 year old female with PMH significant for HTN, DM, diastolic dysfunction, HTN and gout. Pt admitted with 2 seizures and found to have R Frontal Mass with plans for surgical intervention. Intubated 11/30-12/2.     Assessment / Plan / Recommendation Clinical Impression  Pt given subtests of Cognistat and MOCA. Areas for improvement for independence include higher level attention (alternating/divided), problem solving in higher level performance during activiites,  prospective memory, and awareness. ST will treat pt on acute venue and recommend inpatient therapies.      SLP Assessment  Patient needs continued Speech Lanaguage Pathology Services    Follow Up Recommendations  Inpatient Rehab    Frequency and Duration min 2x/week  2 weeks      SLP Evaluation Cognition  Overall Cognitive Status: Impaired/Different from baseline Arousal/Alertness: Awake/alert Orientation Level: Oriented to person;Oriented to place;Disoriented to time;Oriented to situation Attention: Divided Sustained Attention: Appears intact Divided Attention: Impaired Divided Attention Impairment: Verbal basic;Functional basic Memory: Appears intact (8 on Cognistat WNL for her age) Awareness: Impaired Awareness Impairment: Anticipatory impairment;Emergent impairment;Intellectual impairment Problem Solving:  (verbal WFL, suspect difficulty w/ functional) Safety/Judgment: Impaired       Comprehension  Auditory Comprehension Overall Auditory Comprehension: Appears within functional limits for tasks assessed Yes/No Questions: Within Functional Limits Commands: Within Functional Limits Conversation: Simple Visual Recognition/Discrimination Discrimination: Not tested Reading Comprehension Reading Status:  (TBA)    Expression Expression Primary Mode of Expression: Verbal Verbal Expression Overall Verbal Expression: Appears within functional limits for tasks assessed Initiation: No impairment Level of Generative/Spontaneous Verbalization: Conversation Repetition: No impairment Naming: No impairment Pragmatics: No impairment Written Expression Dominant Hand: Right Written Expression:  (TBA)   Oral / Motor  Oral Motor/Sensory Function Overall Oral Motor/Sensory Function: Generalized oral weakness Motor Speech Overall Motor Speech: Appears within functional limits for tasks assessed Respiration: Within functional limits Phonation: Normal Resonance: Within functional  limits Articulation: Within functional  limitis Intelligibility: Intelligible Motor Planning: Witnin functional limits   GO                    Houston Siren 07/04/2016, 9:39 AM  Orbie Pyo Colvin Caroli.Ed [employer] [phone]

## 2016-07-04 NOTE — Progress Notes (Signed)
Physical Therapy Treatment Patient Details Name: Crystal Parsons MRN: [id number] DOB: May 23, 1946 Today's Date: 07/04/2016    History of Present Illness pt presents with seizure activity and found to have R Frontal Mass with plans for surgical intervention.  pt with hx of HTN, Gout, and DM.      PT Comments    Patient progressing well towards PT goals. Improved ambulation distance with min A for balance/safety. Pt continues to require assist with balance and RW management as pt easily distracted. Still has some confusion, impaired problem solving and impaired short term memory. Plan for OR tomorrow. Will await new orders postop and to further assess appropriate disposition and needs.   Follow Up Recommendations  CIR     Equipment Recommendations  None recommended by PT    Recommendations for Other Services       Precautions / Restrictions Precautions Precautions: Fall Restrictions Weight Bearing Restrictions: No    Mobility  Bed Mobility Overal bed mobility: Needs Assistance Bed Mobility: Supine to Sit     Supine to sit: Min guard;HOB elevated     General bed mobility comments: Able to get to EOB without assist but increased time. + dizziness.   Transfers Overall transfer level: Needs assistance Equipment used: Rolling walker (2 wheeled) Transfers: Sit to/from Stand Sit to Stand: Min assist         General transfer comment: Assist to power to standing with cues for technique.   Ambulation/Gait Ambulation/Gait assistance: Min assist Ambulation Distance (Feet): 60 Feet Assistive device: Rolling walker (2 wheeled) Gait Pattern/deviations: Step-through pattern;Shuffle;Trunk flexed;Decreased stride length Gait velocity: decreased   General Gait Details: very slow, unsteady gait with running into obstacles on right side and walking within right side of RW. Easily distracted. Min A for balance.    Stairs            Wheelchair Mobility    Modified Rankin  (Stroke Patients Only)       Balance Overall balance assessment: Needs assistance Sitting-balance support: Feet supported;No upper extremity supported Sitting balance-Leahy Scale: Fair     Standing balance support: During functional activity;Bilateral upper extremity supported Standing balance-Leahy Scale: Poor Standing balance comment: Reliant on BUEs for support in standing.                    Cognition Arousal/Alertness: Awake/alert Behavior During Therapy: WFL for tasks assessed/performed Overall Cognitive Status: Impaired/Different from baseline   Orientation Level: Disoriented to;Situation Current Attention Level: Sustained Memory: Decreased recall of precautions;Decreased short-term memory Following Commands: Follows multi-step commands with increased time Safety/Judgement: Decreased awareness of deficits;Decreased awareness of safety Awareness: Emergent Problem Solving: Slow processing General Comments: Difficulty retaining information from within session.    Exercises      General Comments        Pertinent Vitals/Pain Pain Assessment: No/denies pain    Home Living     Available Help at Discharge: Family Type of Home: House              Prior Function            PT Goals (current goals can now be found in the care plan section) Progress towards PT goals: Progressing toward goals    Frequency    Min 3X/week      PT Plan Current plan remains appropriate    Co-evaluation             End of Session Equipment Utilized During Treatment: Gait belt Activity Tolerance: Patient tolerated  treatment well Patient left: in chair;with call bell/phone within reach;with chair alarm set     Time: 1010-1044 PT Time Calculation (min) (ACUTE ONLY): 34 min  Charges:  $Gait Training: 23-37 mins                    G Codes:      Ross Hefferan A Cleaven Demario 07/04/2016, 11:56 AM Wray Kearns, PT, DPT [phone]

## 2016-07-04 NOTE — Progress Notes (Signed)
No issues overnight. Pt extubated over the weekend. She does note some mild weakness of the left hand.  EXAM:  BP (!) 137/59   Pulse 83   Temp 98.4 F (36.9 C) (Oral)   Resp (!) 21   Ht 5\' 3"  (1.6 m)   Wt 85.8 kg (189 lb 2.5 oz)   SpO2 100%   BMI 33.51 kg/m   Awake, alert, oriented  Speech fluent, appropriate  CN grossly intact  5/5 BUE/BLE x decreased left grip (+) left drift  IMPRESSION:  70 y.o. 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 Will need resection.  PLAN: - OR Thursday for right frontal crani for resection - Can transfer out of unit till then  I have reviewed the situation thus far with the patient and her family. We discussed the likely diagnosis and the treatment options. Risks of surgery were reviewed, specifically including weakness/numbness/paralysis/coma/death, SZ, hydrocephalus, CSF leak, bleeding, infection, and need for additional surgery. We also discussed general risks of anesthesia including DVT/PE, MI, CVA. All questions were answered, they are willing to proceed as above.

## 2016-07-04 NOTE — Progress Notes (Signed)
[facility] ADULT ICU REPLACEMENT PROTOCOL FOR AM LAB REPLACEMENT ONLY  The patient does apply for the [facility] Adult ICU Electrolyte Replacment Protocol based on the criteria listed below:   1. Is GFR >/= 40 ml/min? Yes.    Patient's GFR today is >60 2. Is urine output >/= 0.5 ml/kg/hr for the last 6 hours? Yes.   Patient's UOP is 1.4 ml/kg/hr 3. Is BUN < 60 mg/dL? Yes.    Patient's BUN today is 15 4. Abnormal electrolyte(s):  K - 3.4 5. Ordered repletion with: PER PROTOCOL 6. If a panic level lab has been reported, has the CCM MD in charge been notified? Yes.  .   Physician:  Dr. Laurelyn Sickle 07/04/2016 6:07 AM

## 2016-07-04 NOTE — Progress Notes (Signed)
Nutrition Follow-up  DOCUMENTATION CODES:   Obesity unspecified  INTERVENTION:  Encouraged adequate healthful PO intake   NUTRITION DIAGNOSIS:   Inadequate oral intake related to inability to eat as evidenced by NPO status.  Discontinued  GOAL:   Patient will meet greater than or equal to 90% of their needs  Progressing  MONITOR:   PO intake, Skin, I & O's, Labs, Weight trends  REASON FOR ASSESSMENT:   Ventilator, Consult Enteral/tube feeding initiation and management  ASSESSMENT:   70 year old female with PMH significant for HTN and gout. She was recently diagnosed with gout and treated with colchicine, allopurinol, and a short course of prednisone. Since that time she had been improving. 11/28 overnight she had 2 seizures. She was seizing en route and in ED and was promptly intubated for airway protection.   Pt was extubated on 12/2 and diet was advanced yesterday afternoon. Pt ate 50% of dinner last night per nursing notes. Pt reports having a good appetite and eating most of breakfast this morning. Husband at bedside reports that patient is eating as she normally does. Pt states that her throat is a little sore with swallowing, but her appetite is good as always. RD encouraged intake of lean protein and vegetables as well as fruits and whole grains.   Labs and medications reviewed.   Diet Order:  DIET DYS 3 Room service appropriate? Yes; Fluid consistency: Thin  Skin:  Reviewed, no issues  Last BM:  12/4  Height:   Ht Readings from Last 1 Encounters:  07/03/16 5\' 3"  (1.6 m)    Weight:   Wt Readings from Last 1 Encounters:  07/04/16 189 lb 2.5 oz (85.8 kg)    Ideal Body Weight:  54.5 kg  BMI:  Body mass index is 33.51 kg/m.  Estimated Nutritional Needs:   Kcal:  1700-1900  Protein:  100-110 grams  Fluid:  1.7-1.9 L/day  EDUCATION NEEDS:   No education needs identified at this time  [place], CSP, LDN Inpatient Clinical  Dietitian Pager: [phone] After Hours Pager: [phone]

## 2016-07-04 NOTE — Progress Notes (Signed)
Speech Language Pathology Treatment: Dysphagia  Patient Details Name: Crystal Parsons MRN: [id number] DOB: 02-26-1946 Today's Date: 07/04/2016 Time: [id number] SLP Time Calculation (min) (ACUTE ONLY): 12 min  Assessment / Plan / Recommendation Clinical Impression  Dysphagia intervention during breakfast with daughter and husband at bedside. Min cues for smaller bite size once during observation. Cough x 2 in between po's that did not appear related to solids/liquid. Now recommend straw use if pt desires as no indications of airway compromise. Reiterated importance of small sips/bites, sit upright. Continue Dys 3 diet until pt has surgery (if still the plan), thin liquids, straws allowed and pills in applesauce.    HPI HPI: 70 year old female with PMH significant for HTN, DM, diastolic dysfunction, HTN and gout. Pt admitted with 2 seizures and found to have R Frontal Mass with plans for surgical intervention. Intubated 11/30-12/2.        SLP Plan  Continue with current plan of care     Recommendations  Diet recommendations: Dysphagia 3 (mechanical soft);Thin liquid Liquids provided via: Cup;Straw Medication Administration: Whole meds with puree Supervision: Patient able to self feed;Intermittent supervision to cue for compensatory strategies Compensations: Slow rate;Small sips/bites Postural Changes and/or Swallow Maneuvers: Seated upright 90 degrees                General recommendations: Rehab consult Oral Care Recommendations: Oral care BID Follow up Recommendations: Inpatient Rehab Plan: Continue with current plan of care       GO                Houston Siren 07/04/2016, 9:13 AM Orbie Pyo Colvin Caroli.Ed [employer] [phone]

## 2016-07-04 NOTE — Care Management Important Message (Signed)
Important Message  Patient Details  Name: Crystal Parsons MRN: [id number] Date of Birth: [birth year]   Medicare Important Message Given:  Other (see comment)    [place], Moneisha Vosler [place] 07/04/2016, 11:21 AM

## 2016-07-04 NOTE — Progress Notes (Signed)
PULMONARY / CRITICAL CARE MEDICINE   Name: Crystal Parsons MRN: [id number] DOB: [birth year]    ADMISSION DATE:  06/30/2016 CONSULTATION DATE:  11/30  REFERRING MD:  Dr. Ron Parker EDP resident  CHIEF COMPLAINT: Seizure  HISTORY OF PRESENT ILLNESS:  70 year old female with PMH significant for HTN and gout. She was recently diagnosed with gout and treated with colchicine, allopurinol, and a short course of prednisone. Since that time she had been improving. 11/28 overnight she had 2 seizures. She was seizing en route and in ED and was promptly intubated for airway protection. PCCM to see.   SUBJECTIVE: no distress  VITAL SIGNS: BP 135/65   Pulse 73   Temp 99 F (37.2 C) (Oral)   Resp (!) 21   Ht 5\' 3"  (1.6 m)   Wt 85.8 kg (189 lb 2.5 oz)   SpO2 99%   BMI 33.51 kg/m   HEMODYNAMICS:    VENTILATOR SETTINGS:    INTAKE / OUTPUT: I/O last 3 completed shifts: In: 3270 [P.O.:240; I.V.:2700; IV Piggyback:330] Out: [id number] [Urine:3245]  PHYSICAL EXAMINATION: General:  Overweight female in NAD sitting in chair Neuro:  RASS 1, follows commands and is orientated HEENT:  /AT, PERRL, no JVD Cardiovascular:  s1 s2 RRR, no MRG Lungs:  Clear to auscultation Abdomen:  Soft, non-distended, no r/g Musculoskeletal:  No acute deformity Skin:  Grossly intact, appropiate  LABS:  BMET  Recent Labs Lab 07/02/16 0444 07/03/16 0534 07/04/16 0446  NA 143 144 139  K 3.6 3.9 3.4*  CL 113* 114* 109  CO2 22 21* 21*  BUN 19 14 15   CREATININE 1.08* 0.91 0.83  GLUCOSE 110* 108* 103*    Electrolytes  Recent Labs Lab 07/01/16 1812 07/02/16 0444 07/02/16 1613 07/03/16 0534 07/04/16 0446  CALCIUM  --  9.0  --  9.2 9.0  MG 2.3 2.2 2.2  --   --   PHOS 5.0* 4.6 3.7  --   --     CBC  Recent Labs Lab 07/02/16 0444 07/03/16 0534 07/04/16 0446  WBC 12.6* 14.3* 11.4*  HGB 11.9* 11.7* 11.0*  HCT 36.4 35.8* 33.2*  PLT 250 239 244    Coag's No results for input(s): APTT, INR in the  last 168 hours.  Sepsis Markers No results for input(s): LATICACIDVEN, PROCALCITON, O2SATVEN in the last 168 hours.  ABG  Recent Labs Lab 07/01/16 0013 07/01/16 0415  PHART 7.435 7.438  PCO2ART 31.4* 35.0  PO2ART 61.0* 165*    Liver Enzymes No results for input(s): AST, ALT, ALKPHOS, BILITOT, ALBUMIN in the last 168 hours.  Cardiac Enzymes No results for input(s): TROPONINI, PROBNP in the last 168 hours.  Glucose  Recent Labs Lab 07/03/16 1142 07/03/16 1601 07/03/16 1916 07/03/16 2327 07/04/16 0342 07/04/16 0817  GLUCAP 91 88 124* 104* 111* 94    Imaging Dg Chest Port 1 View  Result Date: 07/04/2016 CLINICAL DATA:  70 year old female fell and hit head 4 days ago. Cough. Subsequent encounter. EXAM: PORTABLE CHEST 1 VIEW COMPARISON:  07/03/2016 chest x-ray.  07/01/2016 chest CT. FINDINGS: Cardiomegaly. Mild central pulmonary vascular prominence without pulmonary edema, infiltrate or pneumothorax. Minimally tortuous aorta. IMPRESSION: Mild central pulmonary vascular prominence without pulmonary edema. Cardiomegaly. Electronically Signed   By: Genia Del M.D.   On: 07/04/2016 07:37     STUDIES:  CT head 11/30 >>>Mass lesion within the right frontal lobe measuring up to 4.8 cm. The appearance is most suggestive of a primary CNS neoplasm, such as a  high-grade glioma. However, a cerebral abscess may have a similar appearance. MRI with and without contrast is recommended for further characterization. 2. No significant mass effect or midline shift.  No hydrocephalus MRi 12/1>>>as noted CT chest 12/1>>>neg  CULTURES:   ANTIBIOTICS:   SIGNIFICANT EVENTS: 11/28-11/30 seizures at home 11/30 to ED with seizures, intubated for status. 12/2 extubated  LINES/TUBES: ERR 11/30 >>>12/2  DISCUSSION: 70 year old female with no history of seizures admitted 11/30 for 7 seizures in the last 24 hours. Seizing en route to, and in ER. Intubated for airway protection. PCCM to  admit. NS following  ASSESSMENT / PLAN:  PULMONARY A: Acute hypoxemic respiratory failure secondary to status epilepticus  Rt peri hilar airspace process - ATX P:   Ct chest reviewed, atx , IS important Extubated 12/2 Consider CT chest follow up in 2 weeks  CARDIOVASCULAR A:  Hypertensive urgency  P:  Telemetry monitoring PRN labetalol to keep SBP < 130-160 wil hold this losartan/HCTZ with crt prior- would hold this further  RENAL Lab Results  Component Value Date   CREATININE 0.83 07/04/2016   CREATININE 0.91 07/03/2016   CREATININE 1.08 (H) 07/02/2016    Recent Labs Lab 07/02/16 0444 07/03/16 0534 07/04/16 0446  K 3.6 3.9 3.4*    Recent Labs Lab 07/02/16 0444 07/03/16 0534 07/04/16 0446  NA 143 144 139      A:    ?AKI, unable to establish baseline hypoK   P:   Follow labs in am after K supp  GASTROINTESTINAL A:   No acute issues  P:   Advance diet  HEMATOLOGIC A:   DVT prevenention  P:  SCD as to OR  INFECTIOUS A:   Leukocytosis. Likely acute phase reactant, but possibly aspirated although no witnessed vomiting.   P:   Ct chest neg  ENDOCRINE CBG (last 3)   Recent Labs  07/03/16 2327 07/04/16 0342 07/04/16 0817  GLUCAP 104* 111* 94     A:   DM listed in history but on no meds for this. 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 P:   RASS goal: 0-1 keppra To OR in am    FAMILY  - Updates: updated husband and pt  - Inter-disciplinary family meet or Palliative Care meeting due by:  12/6  Lavon Paganini. Titus Mould, MD, [place] Pgr: [facility]

## 2016-07-05 LAB — BASIC METABOLIC PANEL
Anion gap: 9 (ref 5–15)
BUN: 15 mg/dL (ref 6–20)
CALCIUM: 9.1 mg/dL (ref 8.9–10.3)
CO2: 21 mmol/L — ABNORMAL LOW (ref 22–32)
CREATININE: 0.83 mg/dL (ref 0.44–1.00)
Chloride: 110 mmol/L (ref 101–111)
Glucose, Bld: 123 mg/dL — ABNORMAL HIGH (ref 65–99)
Potassium: 3.7 mmol/L (ref 3.5–5.1)
SODIUM: 140 mmol/L (ref 135–145)

## 2016-07-05 LAB — GLUCOSE, CAPILLARY
GLUCOSE-CAPILLARY: 120 mg/dL — AB (ref 65–99)
GLUCOSE-CAPILLARY: 95 mg/dL (ref 65–99)
GLUCOSE-CAPILLARY: 111 mg/dL — AB (ref 65–99)
GLUCOSE-CAPILLARY: 115 mg/dL — AB (ref 65–99)
Glucose-Capillary: 104 mg/dL — ABNORMAL HIGH (ref 65–99)
Glucose-Capillary: 116 mg/dL — ABNORMAL HIGH (ref 65–99)

## 2016-07-05 LAB — BLOOD GAS, ARTERIAL
ACID-BASE DEFICIT: 2.2 mmol/L — AB (ref 0.0–2.0)
BICARBONATE: 21.4 mmol/L (ref 20.0–28.0)
Drawn by: 46203
FIO2: 21
O2 SAT: 94.1 %
PATIENT TEMPERATURE: 98.6
PO2 ART: 75.5 mmHg — AB (ref 83.0–108.0)
pCO2 arterial: 32.4 mmHg (ref 32.0–48.0)
pH, Arterial: 7.435 (ref 7.350–7.450)

## 2016-07-05 LAB — MAGNESIUM: Magnesium: 1.8 mg/dL (ref 1.7–2.4)

## 2016-07-05 LAB — PHOSPHORUS: Phosphorus: 4.5 mg/dL (ref 2.5–4.6)

## 2016-07-05 MED ORDER — HYDRALAZINE HCL 25 MG PO TABS
25.0000 mg | ORAL_TABLET | Freq: Three times a day (TID) | ORAL | Status: DC
  Administered 2016-07-05 – 2016-07-11 (×18): 25 mg via ORAL
  Filled 2016-07-05 (×18): qty 1

## 2016-07-05 NOTE — Care Management Note (Signed)
Case Management Note  Patient Details  Name: Crystal Parsons MRN: [id number] Date of Birth: Apr 11, 1946  Subjective/Objective:   Pt admitted with seizure activity on 06/30/16 and found to have Rt frontal mass with plans for surgical intervention.  PTA, pt independent, lives with spouse.                   Action/Plan: Met with pt, spouse and daughter to discuss dc plans.  Family able to provide 24h care at discharge.  Surgery scheduled for Thursday, 07/07/16.  PT/OT recommending CIR prior to return home.  Will follow progress.    Expected Discharge Date:                  Expected Discharge Plan:  [place]  In-House Referral:     Discharge planning Services  CM Consult  Post Acute Care Choice:    Choice offered to:     DME Arranged:    DME Agency:     HH Arranged:    [place] Agency:     Status of Service:  In process, will continue to follow  If discussed at Long Length of Stay Meetings, dates discussed:    Additional Comments:  Reinaldo Raddle, RN, BSN  Trauma/Neuro ICU Case Manager [phone]

## 2016-07-05 NOTE — Progress Notes (Signed)
No issues overnight. Cont to report left grip weakness  EXAM:  BP (!) 143/61   Pulse 71   Temp 98.2 F (36.8 C) (Oral)   Resp (!) 22   Ht 5\' 3"  (1.6 m)   Wt 84.6 kg (186 lb 8.2 oz)   SpO2 99%   BMI 33.04 kg/m   Awake, alert, oriented  Speech fluent, appropriate  CN grossly intact  5/5 BUE/BLE x left grip, 4/5 left bicep  IMPRESSION:  70 y.o. 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

## 2016-07-05 NOTE — Progress Notes (Signed)
Speech Language Pathology Treatment: Dysphagia;Cognitive-Linquistic  Patient Details Name: Crystal Parsons MRN: [id number] DOB: 05-25-1946 Today's Date: 07/05/2016 Time: 0920-0948 SLP Time Calculation (min) (ACUTE ONLY): 28 min  Assessment / Plan / Recommendation Clinical Impression  Skilled treatment session focused on addressing dysphagia and cognition goals. SLP facilitated session by providing skilled observation of patient consuming thin liquids via straw with timely swallow responses and no overt s/s of aspiration following consecutive sips.  SLP also facilitated session with a novel problem solving task that required patient to alternate attention between two tasks rules which she did with mild environmental distractions for ~20 minutes with increased time for attention to task and follow through of rules.  Patient required Min question cues for accuracy due to decreased self-monitoring.  Patient verbalized awareness of slowed processing at end of session. Continue with current plan of care.     HPI HPI: 70 year old female with PMH significant for HTN, DM, diastolic dysfunction, HTN and gout. Pt admitted with 2 seizures and found to have R Frontal Mass with plans for surgical intervention. Intubated 11/30-12/2.        SLP Plan  Continue with current plan of care     Recommendations  Diet recommendations: Dysphagia 3 (mechanical soft);Thin liquid Liquids provided via: Cup;Straw Medication Administration: Whole meds with puree Supervision: Patient able to self feed;Intermittent supervision to cue for compensatory strategies Compensations: Slow rate;Small sips/bites Postural Changes and/or Swallow Maneuvers: Seated upright 90 degrees                Oral Care Recommendations: Oral care BID Follow up Recommendations: Inpatient Rehab Plan: Continue with current plan of care       GO               Carmelia Roller., CCC-SLP [id number]  [place] 07/05/2016, 11:03  AM

## 2016-07-05 NOTE — Progress Notes (Addendum)
PULMONARY / CRITICAL CARE MEDICINE   Name: Crystal Parsons MRN: [id number] DOB: Apr 17, 1946    ADMISSION DATE:  06/30/2016 CONSULTATION DATE:  11/30  REFERRING MD:  Dr. Ron Parker EDP resident  CHIEF COMPLAINT: Seizure  HISTORY OF PRESENT ILLNESS:  70 year old female with PMH significant for HTN and gout. She was recently diagnosed with gout and treated with colchicine, allopurinol, and a short course of prednisone. Since that time she had been improving. 11/28 overnight she had 2 seizures. She was seizing en route and in ED and was promptly intubated for airway protection. PCCM to see.   SUBJECTIVE: no distress  VITAL SIGNS: BP (!) 146/70   Pulse 77   Temp 98.1 F (36.7 C) (Oral)   Resp 17   Ht 5\' 3"  (1.6 m)   Wt 186 lb 8.2 oz (84.6 kg)   SpO2 98%   BMI 33.04 kg/m   HEMODYNAMICS:    VENTILATOR SETTINGS:    INTAKE / OUTPUT: I/O last 3 completed shifts: In: 3030 [I.V.:2700; IV Piggyback:330] Out: [id number] [Urine:3965]  PHYSICAL EXAMINATION: General:  Overweight female in NAD in bed Neuro:  RASS 1, follows commands and is orientated HEENT:  [place]/AT, PERRL, no JVD Cardiovascular:  s1 s2 RRR, no MRG Lungs:  Clear to auscultation Abdomen:  Soft, non-distended, no r/g Musculoskeletal:  No acute deformity Skin:  Grossly intact, appropiate  LABS:  BMET  Recent Labs Lab 07/03/16 0534 07/04/16 0446 07/05/16 0256  NA 144 139 140  K 3.9 3.4* 3.7  CL 114* 109 110  CO2 21* 21* 21*  BUN 14 15 15   CREATININE 0.91 0.83 0.83  GLUCOSE 108* 103* 123*    Electrolytes  Recent Labs Lab 07/02/16 0444 07/02/16 1613 07/03/16 0534 07/04/16 0446 07/05/16 0256  CALCIUM 9.0  --  9.2 9.0 9.1  MG 2.2 2.2  --   --  1.8  PHOS 4.6 3.7  --   --  4.5    CBC  Recent Labs Lab 07/02/16 0444 07/03/16 0534 07/04/16 0446  WBC 12.6* 14.3* 11.4*  HGB 11.9* 11.7* 11.0*  HCT 36.4 35.8* 33.2*  PLT 250 239 244    Coag's No results for input(s): APTT, INR in the last 168  hours.  Sepsis Markers No results for input(s): LATICACIDVEN, PROCALCITON, O2SATVEN in the last 168 hours.  ABG  Recent Labs Lab 07/01/16 0013 07/01/16 0415 07/05/16 0345  PHART 7.435 7.438 7.435  PCO2ART 31.4* 35.0 32.4  PO2ART 61.0* 165* 75.5*    Liver Enzymes No results for input(s): AST, ALT, ALKPHOS, BILITOT, ALBUMIN in the last 168 hours.  Cardiac Enzymes No results for input(s): TROPONINI, PROBNP in the last 168 hours.  Glucose  Recent Labs Lab 07/04/16 1133 07/04/16 1658 07/04/16 1957 07/04/16 2344 07/05/16 0342 07/05/16 0802  GLUCAP 92 97 94 120* 120* 104*    Imaging No results found.   STUDIES:  CT head 11/30 >>>Mass lesion within the right frontal lobe measuring up to 4.8 cm. 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 However, a cerebral abscess may have a similar appearance. MRI with and without contrast is recommended for further characterization. 2. No significant mass effect or midline shift.  No hydrocephalus MRi 12/1>>>as noted CT chest 12/1>>>neg  CULTURES:   ANTIBIOTICS:   SIGNIFICANT EVENTS: 11/28-11/30 seizures at home 11/30 to ED with seizures, intubated for status. 12/2 extubated  LINES/TUBES: [facility] 11/30 >>>12/2  DISCUSSION: 70 year old female with no history of  seizures admitted 11/30 for 7 seizures in the last 24 hours. Seizing en route to, and in ER. Intubated for airway protection. PCCM to admit. NS following  ASSESSMENT / PLAN:  PULMONARY A: Acute hypoxemic respiratory failure secondary to status epilepticus  Rt peri hilar airspace process - ATX P:   Ct chest reviewed, atx , IS important Extubated 12/2 Consider CT chest follow up in 2 weeks OOB  CARDIOVASCULAR A:  Hypertensive urgency  P:  Telemetry monitoring PRN labetalol to keep SBP < 130-160 wil hold this losartan/HCTZ with crt prior- would hold this further  RENAL Lab Results  Component Value Date    CREATININE 0.83 07/05/2016   CREATININE 0.83 07/04/2016   CREATININE 0.91 07/03/2016    Recent Labs Lab 07/03/16 0534 07/04/16 0446 07/05/16 0256  K 3.9 3.4* 3.7    Recent Labs Lab 07/03/16 0534 07/04/16 0446 07/05/16 0256  NA 144 139 140      A:    ?AKI, unable to establish baseline hypoK   P:   Follow labs in am after K supp  GASTROINTESTINAL A:   No acute issues  P:   Advance diet  HEMATOLOGIC A:   DVT prevenention  P:  SCD as to OR  INFECTIOUS A:   Leukocytosis. Likely acute phase reactant, but possibly aspirated although no witnessed vomiting.   P:   Ct chest neg  ENDOCRINE CBG (last 3)   Recent Labs  07/04/16 2344 07/05/16 0342 07/05/16 0802  GLUCAP 120* 120* 104*     A:   DM listed in history but on no meds for this. 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 P:   RASS goal: 0-1 keppra To OR 12/7 for cranial resection  Transfer to floor 12/5  FAMILY  - Updates: updated husband and pt  - Inter-disciplinary family meet or Palliative Care meeting due by:  12/6  Richardson Landry Minor ACNP Maryanna Shape PCCM Pager [phone] till 3 pm If no answer page [phone] 07/05/2016, 9:01 AM   STAFF NOTE: I, Merrie Roof, MD FACP have personally reviewed patient's available data, including medical history, events of note, physical examination and test results as part of my evaluation. I have discussed with resident/NP and other care providers such as pharmacist, RN and RRT. In addition, I personally evaluated patient and elicited key findings of: awake and chatty in chair, no distress, clear lungs, neg 725 cc, ABG this am wnl, no repeat needed, no seizures since admission, move to floor, even to slight neg balance okay, repeat bmet in am needed, for OR thur, possible flare gout finger, would avoid colchicine in house, add steroids if gout  flare is dx, maintain allopurinol, on diet, BP remains high on home regimen, add low dose hydral,(hr 70-75)  Lavon Paganini. Titus Mould, MD, [place] Pgr: [facility] 07/05/2016 11:45 AM

## 2016-07-06 LAB — BASIC METABOLIC PANEL
ANION GAP: 16 — AB (ref 5–15)
BUN: 15 mg/dL (ref 6–20)
CHLORIDE: 105 mmol/L (ref 101–111)
CO2: 21 mmol/L — AB (ref 22–32)
CREATININE: 0.84 mg/dL (ref 0.44–1.00)
Calcium: 9.9 mg/dL (ref 8.9–10.3)
GFR calc non Af Amer: >60 mL/min (ref >60)
Glucose, Bld: 109 mg/dL — ABNORMAL HIGH (ref 65–99)
POTASSIUM: 3.4 mmol/L — AB (ref 3.5–5.1)
SODIUM: 142 mmol/L (ref 135–145)

## 2016-07-06 LAB — CBC
HCT: 38 % (ref 36.0–46.0)
HEMOGLOBIN: 13.2 g/dL (ref 12.0–15.0)
MCH: 30.8 pg (ref 26.0–34.0)
MCHC: 34.7 g/dL (ref 30.0–36.0)
MCV: 88.8 fL (ref 78.0–100.0)
PLATELETS: 334 10*3/uL (ref 150–400)
RBC: 4.28 MIL/uL (ref 3.87–5.11)
RDW: 12.4 % (ref 11.5–15.5)
WBC: 12.1 10*3/uL — AB (ref 4.0–10.5)

## 2016-07-06 LAB — GLUCOSE, CAPILLARY
GLUCOSE-CAPILLARY: 157 mg/dL — AB (ref 65–99)
Glucose-Capillary: 106 mg/dL — ABNORMAL HIGH (ref 65–99)
Glucose-Capillary: 119 mg/dL — ABNORMAL HIGH (ref 65–99)
Glucose-Capillary: 108 mg/dL — ABNORMAL HIGH (ref 65–99)

## 2016-07-06 LAB — TRIGLYCERIDES: Triglycerides: 147 mg/dL (ref <150)

## 2016-07-06 MED ORDER — PREDNISONE 20 MG PO TABS
20.0000 mg | ORAL_TABLET | Freq: Two times a day (BID) | ORAL | Status: DC
  Administered 2016-07-06 (×2): 20 mg via ORAL
  Filled 2016-07-06 (×3): qty 1

## 2016-07-06 MED ORDER — ACETAMINOPHEN 325 MG PO TABS
650.0000 mg | ORAL_TABLET | Freq: Four times a day (QID) | ORAL | Status: DC | PRN
  Administered 2016-07-06 (×2): 650 mg via ORAL
  Filled 2016-07-06: qty 2

## 2016-07-06 NOTE — Progress Notes (Signed)
Physical Therapy Treatment Patient Details Name: Crystal Parsons MRN: [id number] DOB: [birth year] Today's Date: 07/06/2016    History of Present Illness pt presents with seizure activity and found to have R Frontal Mass with plans for surgical intervention.  pt with hx of HTN, Gout, and DM.  Plan for surgery on 12/7    PT Comments    Pt continuing to make good functional progress with mobility progressing to not using an AD during gait activities to increase challenge during todays session. Pt with minimal LOB demonstrating overall improved gait and balance since last session. Pt continues to present with cognitive impairments affecting memory, processing, attention, and initiation. Extra time for all tasks. Family very supportive. Education provided on rehab progress and overall goals of therapies. Continue with PT POC and recommend for continued assessment for d/c planning post surgery tomorrow (12/7) to determine follow up needs.    Follow Up Recommendations  CIR (will need to re-assess after surgical intervention)     Equipment Recommendations  None recommended by PT    Recommendations for Other Services       Precautions / Restrictions Precautions Precautions: Fall Restrictions Weight Bearing Restrictions: No    Mobility  Bed Mobility Overal bed mobility: Needs Assistance Bed Mobility: Supine to Sit;Sit to Supine     Supine to sit: Supervision;HOB elevated Sit to supine: Supervision   General bed mobility comments: increased time for initiating movement. cues for technique  Transfers Overall transfer level: Needs assistance Equipment used: None Transfers: Sit to/from Stand Sit to Stand: Min guard         General transfer comment: cues for hand placement and initiation  Ambulation/Gait Ambulation/Gait assistance: Min guard Ambulation Distance (Feet): 130 Feet Assistive device: None Gait Pattern/deviations: Decreased stride length;Step-through pattern      General Gait Details: 2 episodes of mild LOB but able to recover with steadying assist. Decreased awareness of surroundings in hallway noted requiring cues    Stairs            Wheelchair Mobility    Modified Rankin (Stroke Patients Only)       Balance Overall balance assessment: Needs assistance Sitting-balance support: Feet supported;No upper extremity supported Sitting balance-Leahy Scale: Good     Standing balance support: During functional activity;No upper extremity supported;Single extremity supported Standing balance-Leahy Scale: Fair                      Cognition Arousal/Alertness: Awake/alert Behavior During Therapy: WFL for tasks assessed/performed Overall Cognitive Status: Impaired/Different from baseline Area of Impairment: Memory;Problem solving;Awareness;Safety/judgement;Attention Orientation Level: Person;Place;Situation;Time Current Attention Level: Alternating Memory: Decreased short-term memory;Decreased recall of precautions Following Commands: Follows multi-step commands with increased time Safety/Judgement: Decreased awareness of safety;Decreased awareness of deficits Awareness: Emergent Problem Solving: Slow processing;Requires verbal cues;Difficulty sequencing      Exercises      General Comments        Pertinent Vitals/Pain Pain Assessment:  (did not rate) Pain Location: headache Pain Descriptors / Indicators: Aching Pain Intervention(s): Monitored during session;Premedicated before session    Home Living                      Prior Function            PT Goals (current goals can now be found in the care plan section) Acute Rehab PT Goals Patient Stated Goal: be able to go home PT Goal Formulation: With patient/family Time For Goal Achievement: 07/17/16 Potential to  Achieve Goals: Good Progress towards PT goals: Progressing toward goals    Frequency    Min 4X/week      PT Plan Frequency needs to  be updated    Co-evaluation             End of Session Equipment Utilized During Treatment: Gait belt Activity Tolerance: Patient tolerated treatment well Patient left: in bed;with call bell/phone within reach;with bed alarm set;with family/visitor present     Time: [id number] PT Time Calculation (min) (ACUTE ONLY): 26 min  Charges:  $Gait Training: 8-22 mins $Therapeutic Activity: 8-22 mins                    G Codes:      Canary Brim Ivory Broad, PT, DPT Pager #: [phone] 07/06/2016, 10:40 AM

## 2016-07-06 NOTE — Progress Notes (Signed)
PROGRESS NOTE  Crystal Parsons  [id number] DOB: [birth year] DOA: 06/30/2016 PCP: Irven Shelling, MD Outpatient Specialists:  Subjective: Seen with husband and daughter at bedside. Complains about slight headache, asked for Tylenol.  Brief Narrative:  70 year old female with PMH significant for HTN and gout. She was recently diagnosed with gout and treated with colchicine, allopurinol, and a short course of prednisone. Since that time she had been improving. 11/28 overnight she had 2 seizures. She was seizing en route and in ED and was promptly intubated for airway protection. PCCM to see.   Assessment & Plan:   Active Problems:   Seizure ([place])   Status epilepticus ([place])   Acute respiratory failure with hypoxia ([facility])   Brain mass   Seizure -Presented with 2 episodes of seizure, one is witnessed by ED staff. -EEG done on 12/1, no report on Epic likely report is pending. -Patient has right frontal lobe mass likely causing the seizure, currently on Keppra, no recent seizure like activity.  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 -Post surgery likely is going to be on neurosurgical ICU/stepdown, neurosurgery will be the attending.  Acute respiratory failure with hypoxia -Patient intubated upon admission to the ED for airway protection. -Patient was out of it postictally, after extubation did very well.  DVT prophylaxis:  Code Status: Full Code Family Communication:  Disposition Plan:  Diet: DIET DYS 3 Room service appropriate? Yes; Fluid consistency: Thin  Consultants:   Neuro  Procedures:   Intubation/extubation  Antimicrobials:   None   Objective: Vitals:   07/05/16 1526 07/05/16 2158 07/06/16 0438 07/06/16 0500  BP: (!) 150/59 (!) 158/72 (!) 148/58   Pulse: 73 81 73   Resp: 17 19 18    Temp: 98.1  F (36.7 C) 98.2 F (36.8 C) 97.6 F (36.4 C)   TempSrc: Oral Oral Oral   SpO2: 98% 98% 95%   Weight:    83.3 kg (183 lb 9.6 oz)  Height:        Intake/Output Summary (Last 24 hours) at 07/06/16 1213 Last data filed at 07/06/16 0800  Gross per 24 hour  Intake           312.17 ml  Output              500 ml  Net          -187.83 ml   Filed Weights   07/04/16 0410 07/05/16 0500 07/06/16 0500  Weight: 85.8 kg (189 lb 2.5 oz) 84.6 kg (186 lb 8.2 oz) 83.3 kg (183 lb 9.6 oz)    Examination: General exam: Appears calm and comfortable  Respiratory system: Clear to auscultation. Respiratory effort normal. Cardiovascular system: S1 & S2 heard, RRR. No JVD, murmurs, rubs, gallops or clicks. No pedal edema. Gastrointestinal system: Abdomen is nondistended, soft and nontender. No organomegaly or masses felt. Normal bowel sounds heard. Central nervous system: Alert and oriented. No focal neurological deficits. Extremities: Symmetric 5 x 5 power. Skin: No rashes, lesions or ulcers Psychiatry: Judgement and insight appear normal. Mood & affect appropriate.   Data Reviewed: I have personally reviewed following labs and imaging studies  CBC:  Recent Labs Lab 06/30/16 2223  07/01/16 0359 07/02/16 0444 07/03/16 0534 07/04/16 0446 07/06/16 0535  WBC 20.4*  --  16.8* 12.6* 14.3* 11.4* 12.1*  NEUTROABS 9.2*  --   --  9.3*  --   --   --   HGB 14.0  < > 12.2 11.9* 11.7* 11.0* 13.2  HCT 42.1  < > 36.2 36.4 35.8* 33.2* 38.0  MCV 93.6  --  89.4 92.4 92.7 91.5 88.8  PLT 365  --  303 250 239 244 334  < > = values in this interval not displayed. Basic Metabolic Panel:  Recent Labs Lab 07/01/16 1011 07/01/16 1812 07/02/16 0444 07/02/16 1613 07/03/16 0534 07/04/16 0446 07/05/16 0256 07/06/16 0535  NA 138  --  143  --  144 139 140 142  K 4.0  --  3.6  --  3.9 3.4* 3.7 3.4*  CL 107  --  113*  --  114* 109 110 105  CO2 22  --  22  --  21* 21* 21* 21*  GLUCOSE 122*  --  110*  --  108*  103* 123* 109*  BUN 11  --  19  --  14 15 15 15   CREATININE 1.05*  --  1.08*  --  0.91 0.83 0.83 0.84  CALCIUM 9.4  --  9.0  --  9.2 9.0 9.1 9.9  MG 2.3 2.3 2.2 2.2  --   --  1.8  --   PHOS 4.4 5.0* 4.6 3.7  --   --  4.5  --    GFR: Estimated Creatinine Clearance: 62.8 mL/min (by C-G formula based on SCr of 0.84 mg/dL). Liver Function Tests: No results for input(s): AST, ALT, ALKPHOS, BILITOT, PROT, ALBUMIN in the last 168 hours. No results for input(s): LIPASE, AMYLASE in the last 168 hours. No results for input(s): AMMONIA in the last 168 hours. Coagulation Profile: No results for input(s): INR, PROTIME in the last 168 hours. Cardiac Enzymes:  Recent Labs Lab 06/30/16 2349  CKTOTAL 60   BNP (last 3 results) No results for input(s): PROBNP in the last 8760 hours. HbA1C: No results for input(s): HGBA1C in the last 72 hours. CBG:  Recent Labs Lab 07/05/16 2006 07/05/16 2346 07/06/16 0402 07/06/16 0755 07/06/16 1124  GLUCAP 115* 116* 106* 119* 157*   Lipid Profile:  Recent Labs  07/06/16 0535  TRIG 147   Thyroid Function Tests: No results for input(s): TSH, T4TOTAL, FREET4, T3FREE, THYROIDAB in the last 72 hours. Anemia Panel: No results for input(s): VITAMINB12, FOLATE, FERRITIN, TIBC, IRON, RETICCTPCT in the last 72 hours. Urine analysis:    Component Value Date/Time   COLORURINE YELLOW 07/01/2016 1024   APPEARANCEUR CLEAR 07/01/2016 1024   LABSPEC 1.008 07/01/2016 1024   PHURINE 5.5 07/01/2016 1024   GLUCOSEU NEGATIVE 07/01/2016 1024   HGBUR NEGATIVE 07/01/2016 1024   BILIRUBINUR NEGATIVE 07/01/2016 1024   KETONESUR NEGATIVE 07/01/2016 1024   PROTEINUR NEGATIVE 07/01/2016 1024   NITRITE NEGATIVE 07/01/2016 1024   LEUKOCYTESUR NEGATIVE 07/01/2016 1024   Sepsis Labs: @LABRCNTIP (procalcitonin:4,lacticidven:4)  ) Recent Results (from the past 240 hour(s))  MRSA PCR Screening     Status: None   Collection Time: 07/01/16  1:46 AM  Result Value Ref  Range Status   MRSA by PCR NEGATIVE NEGATIVE Final    Comment:        The GeneXpert MRSA Assay (FDA approved for NASAL specimens only), is one component of a comprehensive MRSA colonization surveillance program. It is not intended to diagnose MRSA infection nor to guide or monitor treatment for MRSA infections.      Invalid input(s): PROCALCITONIN, [place]   Radiology Studies: No results found.  Scheduled Meds: . allopurinol  300 mg Oral Daily  . cholecalciferol  2,000 Units Oral Daily  . famotidine  20 mg Oral BID  . hydrALAZINE  25 mg Oral Q8H  . hydrochlorothiazide  12.5 mg Oral Daily  . levETIRAcetam  1,000 mg Intravenous Q12H  . losartan  50 mg Oral Daily  . mouth rinse  15 mL Mouth Rinse BID  . multivitamin with minerals  1 tablet Oral Daily  . pravastatin  40 mg Oral q1800  . predniSONE  20 mg Oral BID WC   Continuous Infusions: . sodium chloride 10 mL/hr at 07/05/16 1147     LOS: 6 days    Time spent: 35 minutes    Su Duma A, MD [facility] Pager [phone]  If 7PM-7AM, please contact night-coverage [email or web address] Password [email or web address] 07/06/2016, 12:13 PM

## 2016-07-06 NOTE — Evaluation (Signed)
Occupational Therapy Evaluation Patient Details Name: Crystal Parsons MRN: [id number] DOB: 08/21/1945 Today's Date: 07/06/2016    History of Present Illness pt presents with seizure activity and found to have R Frontal Mass with plans for surgical intervention.  pt with hx of HTN, Gout, and DM.  Plan for surgery on 12/7   Clinical Impression   Pt reports she was independent with ADL PTA. Currently pt overall supervision for ADL and functional mobility with the exception of min assist for LB ADL. Pt presenting with impaired cognition and decreased R hand grip; ?gout. Plan for sx tomorrow for removal of R frontal mass. Recommending CIR at this time to maximize independence and safety with ADL and functional mobility, however, pending progress post-op pt may be able to return home with [facility] services. Pt would benefit from continued skilled OT to address established goals.    Follow Up Recommendations  CIR;Supervision/Assistance - 24 hour    Equipment Recommendations  Other (comment) (TBD at next venue)    Recommendations for Other Services       Precautions / Restrictions Precautions Precautions: Fall Restrictions Weight Bearing Restrictions: No      Mobility Bed Mobility Overal bed mobility: Modified Independent Bed Mobility: Supine to Sit;Sit to Supine     Supine to sit: Modified independent (Device/Increase time) Sit to supine: Modified independent (Device/Increase time)   General bed mobility comments: HOB elevated and increased time required.  Transfers Overall transfer level: Needs assistance Equipment used: None Transfers: Sit to/from Stand Sit to Stand: Supervision         General transfer comment: Supervision for safety. No unsteadiness or LOB.    Balance Overall balance assessment: Needs assistance Sitting-balance support: Feet supported;No upper extremity supported Sitting balance-Leahy Scale: Good     Standing balance support: No upper extremity  supported;During functional activity Standing balance-Leahy Scale: Good                              ADL Overall ADL's : Needs assistance/impaired     Grooming: Supervision/safety;Brushing hair;Applying deodorant;Oral care;Standing   Upper Body Bathing: Set up;Supervision/ safety;Standing   Lower Body Bathing: Supervison/ safety;Sit to/from stand;Set up   Upper Body Dressing : Supervision/safety;Sitting;Set up   Lower Body Dressing: Minimal assistance;Sit to/from stand Lower Body Dressing Details (indicate cue type and reason): Assist for starting pants over L foot. Toilet Transfer: Supervision/safety;Ambulation;Regular Toilet   Toileting- Water quality scientist and Hygiene: Supervision/safety;Sit to/from stand   Tub/ Shower Transfer: Supervision/safety;Walk-in shower;Ambulation   Functional mobility during ADLs: Supervision/safety General ADL Comments: Pt able to complete full ADL with supervision for safety. Pt able to gather items and perform higher level balance during functional activities without unstediness. Pt with mild SOB following shower and dressing.     Vision Vision Assessment?: No apparent visual deficits   Perception     Praxis      Pertinent Vitals/Pain Pain Assessment: Faces Faces Pain Scale: No hurt Pain Location: headache Pain Descriptors / Indicators: Aching Pain Intervention(s): Monitored during session;Premedicated before session     Hand Dominance Right   Extremity/Trunk Assessment Upper Extremity Assessment Upper Extremity Assessment: RUE deficits/detail RUE Deficits / Details: decreased grip strength and fine motor. ?gout in 2nd digit DIP joint.   Lower Extremity Assessment Lower Extremity Assessment: Defer to PT evaluation   Cervical / Trunk Assessment Cervical / Trunk Assessment: Normal   Communication Communication Communication: No difficulties   Cognition Arousal/Alertness: Awake/alert Behavior During  Therapy: WFL  for tasks assessed/performed Overall Cognitive Status: Impaired/Different from baseline Area of Impairment: Memory;Attention;Awareness;Problem solving;Following commands Orientation Level: Person;Place;Situation;Time Current Attention Level: Alternating Memory: Decreased short-term memory Following Commands: Follows multi-step commands with increased time Safety/Judgement: Decreased awareness of safety;Decreased awareness of deficits Awareness: Emergent Problem Solving: Slow processing;Difficulty sequencing;Requires verbal cues     General Comments       Exercises       Shoulder Instructions      Home Living Family/patient expects to be discharged to:: Private residence Living Arrangements: Spouse/significant other Available Help at Discharge: Family;Available 24 hours/day Type of Home: House       Home Layout: One level     Bathroom Shower/Tub: Occupational psychologist: Standard     Home Equipment: None          Prior Functioning/Environment Level of Independence: Independent                 OT Problem List: Impaired balance (sitting and/or standing);Decreased activity tolerance;Decreased cognition;Decreased safety awareness;Decreased knowledge of use of DME or AE;Obesity   OT Treatment/Interventions: Self-care/ADL training;Energy conservation;DME and/or AE instruction;Therapeutic activities;Patient/family education;Cognitive remediation/compensation    OT Goals(Current goals can be found in the care plan section) Acute Rehab OT Goals Patient Stated Goal: take a shower OT Goal Formulation: With patient Time For Goal Achievement: 07/20/16 Potential to Achieve Goals: Good ADL Goals Pt Will Perform Grooming: with modified independence;standing Pt Will Perform Upper Body Bathing: with modified independence;standing Pt Will Perform Lower Body Bathing: with modified independence Pt Will Perform Upper Body Dressing: with modified  independence;standing;sitting Pt Will Perform Lower Body Dressing: with modified independence;sit to/from stand Pt Will Transfer to Toilet: with modified independence;ambulating;regular height toilet Pt Will Perform Toileting - Clothing Manipulation and hygiene: with modified independence;sit to/from stand Pt Will Perform Tub/Shower Transfer: Shower transfer;with supervision;ambulating  OT Frequency: Min 2X/week   Barriers to D/C:            Co-evaluation              End of Session Nurse Communication: Mobility status  Activity Tolerance: Patient tolerated treatment well Patient left: in bed;with call bell/phone within reach;with bed alarm set;with family/visitor present   Time: [id number] OT Time Calculation (min): 34 min Charges:  OT General Charges $OT Visit: 1 Procedure OT Evaluation $OT Eval Moderate Complexity: 1 Procedure OT Treatments $Self Care/Home Management : 8-22 mins G-Codes:     Binnie Kand M.S., OTR/L Pager: [phone]  07/06/2016, 11:52 AM

## 2016-07-07 ENCOUNTER — Inpatient Hospital Stay (HOSPITAL_COMMUNITY): Payer: Medicare Other | Admitting: Anesthesiology

## 2016-07-07 ENCOUNTER — Inpatient Hospital Stay (HOSPITAL_COMMUNITY): Payer: Medicare Other

## 2016-07-07 ENCOUNTER — Encounter (HOSPITAL_COMMUNITY)
Admission: EM | Disposition: A | Discharge status: Rehabilitation Facility | Payer: Self-pay | Source: Home / Self Care | Attending: Internal Medicine

## 2016-07-07 HISTORY — PX: CRANIOTOMY: SHX93

## 2016-07-07 HISTORY — PX: APPLICATION OF CRANIAL NAVIGATION: SHX6578

## 2016-07-07 LAB — GLUCOSE, CAPILLARY
GLUCOSE-CAPILLARY: 119 mg/dL — AB (ref 65–99)
GLUCOSE-CAPILLARY: 143 mg/dL — AB (ref 65–99)
Glucose-Capillary: 139 mg/dL — ABNORMAL HIGH (ref 65–99)
Glucose-Capillary: 121 mg/dL — ABNORMAL HIGH (ref 65–99)
Glucose-Capillary: 174 mg/dL — ABNORMAL HIGH (ref 65–99)
Glucose-Capillary: 163 mg/dL — ABNORMAL HIGH (ref 65–99)

## 2016-07-07 LAB — SURGICAL PCR SCREEN
MRSA, PCR: NEGATIVE
Staphylococcus aureus: NEGATIVE

## 2016-07-07 SURGERY — CRANIOTOMY TUMOR EXCISION
Anesthesia: General | Site: Head | Laterality: Right

## 2016-07-07 MED ORDER — PROPOFOL 10 MG/ML IV BOLUS
INTRAVENOUS | Status: AC
Start: 2016-07-07 — End: 2016-07-07
  Filled 2016-07-07: qty 20

## 2016-07-07 MED ORDER — MIDAZOLAM HCL 2 MG/2ML IJ SOLN
INTRAMUSCULAR | Status: AC
  Filled 2016-07-07: qty 2

## 2016-07-07 MED ORDER — ROCURONIUM BROMIDE 10 MG/ML (PF) SYRINGE
PREFILLED_SYRINGE | INTRAVENOUS | Status: AC
  Filled 2016-07-07: qty 20

## 2016-07-07 MED ORDER — HYDROCODONE-ACETAMINOPHEN 5-325 MG PO TABS
1.0000 | ORAL_TABLET | ORAL | Status: DC | PRN
  Administered 2016-07-07 – 2016-07-11 (×3): 1 via ORAL
  Filled 2016-07-07 (×3): qty 1

## 2016-07-07 MED ORDER — PROMETHAZINE HCL 12.5 MG PO TABS
12.5000 mg | ORAL_TABLET | ORAL | Status: DC | PRN
  Filled 2016-07-07: qty 2

## 2016-07-07 MED ORDER — COLCHICINE 0.6 MG PO TABS
0.6000 mg | ORAL_TABLET | Freq: Every day | ORAL | Status: DC
  Administered 2016-07-08 – 2016-07-11 (×4): 0.6 mg via ORAL
  Filled 2016-07-07 (×5): qty 1

## 2016-07-07 MED ORDER — MIDAZOLAM HCL 5 MG/5ML IJ SOLN
INTRAMUSCULAR | Status: DC | PRN
  Administered 2016-07-07: 2 mg via INTRAVENOUS

## 2016-07-07 MED ORDER — LOSARTAN POTASSIUM-HCTZ 50-12.5 MG PO TABS: 1.0000 | ORAL_TABLET | Freq: Every day | ORAL | Status: DC

## 2016-07-07 MED ORDER — SUGAMMADEX SODIUM 500 MG/5ML IV SOLN
INTRAVENOUS | Status: AC
Start: 2016-07-07 — End: 2016-07-07
  Filled 2016-07-07: qty 5

## 2016-07-07 MED ORDER — ARTIFICIAL TEARS OP OINT
TOPICAL_OINTMENT | OPHTHALMIC | Status: AC
  Filled 2016-07-07: qty 3.5

## 2016-07-07 MED ORDER — ONDANSETRON HCL 4 MG/2ML IJ SOLN
INTRAMUSCULAR | Status: AC
  Filled 2016-07-07: qty 2

## 2016-07-07 MED ORDER — ONDANSETRON HCL 4 MG PO TABS: 4.0000 mg | ORAL_TABLET | ORAL | Status: DC | PRN

## 2016-07-07 MED ORDER — SENNOSIDES-DOCUSATE SODIUM 8.6-50 MG PO TABS: 1.0000 | ORAL_TABLET | Freq: Every evening | ORAL | Status: DC | PRN

## 2016-07-07 MED ORDER — THROMBIN 5000 UNITS EX SOLR
CUTANEOUS | Status: AC
Start: 2016-07-07 — End: 2016-07-07
  Filled 2016-07-07: qty 5000

## 2016-07-07 MED ORDER — DEXAMETHASONE SODIUM PHOSPHATE 4 MG/ML IJ SOLN
4.0000 mg | Freq: Four times a day (QID) | INTRAMUSCULAR | Status: AC
  Administered 2016-07-08 – 2016-07-09 (×4): 4 mg via INTRAVENOUS
  Filled 2016-07-07 (×4): qty 1

## 2016-07-07 MED ORDER — THROMBIN 20000 UNITS EX SOLR
CUTANEOUS | Status: DC | PRN
  Administered 2016-07-07: 15:00:00 via TOPICAL

## 2016-07-07 MED ORDER — FENTANYL CITRATE (PF) 100 MCG/2ML IJ SOLN
INTRAMUSCULAR | Status: AC
  Filled 2016-07-07: qty 4

## 2016-07-07 MED ORDER — BACITRACIN ZINC 500 UNIT/GM EX OINT
TOPICAL_OINTMENT | CUTANEOUS | Status: DC | PRN
  Administered 2016-07-07: 1 via TOPICAL

## 2016-07-07 MED ORDER — CEFAZOLIN SODIUM-DEXTROSE 2-4 GM/100ML-% IV SOLN
2.0000 g | Freq: Three times a day (TID) | INTRAVENOUS | Status: AC
  Administered 2016-07-07 – 2016-07-08 (×2): 2 g via INTRAVENOUS
  Filled 2016-07-07 (×2): qty 100

## 2016-07-07 MED ORDER — HEMOSTATIC AGENTS (NO CHARGE) OPTIME
TOPICAL | Status: DC | PRN
  Administered 2016-07-07: 1 via TOPICAL

## 2016-07-07 MED ORDER — THROMBIN 5000 UNITS EX SOLR
CUTANEOUS | Status: AC
  Filled 2016-07-07: qty 5000

## 2016-07-07 MED ORDER — SODIUM CHLORIDE 0.9 % IV SOLN
INTRAVENOUS | Status: DC
  Administered 2016-07-07 – 2016-07-09 (×3): via INTRAVENOUS

## 2016-07-07 MED ORDER — PROPOFOL 10 MG/ML IV BOLUS
INTRAVENOUS | Status: DC | PRN
  Administered 2016-07-07: 100 mg via INTRAVENOUS

## 2016-07-07 MED ORDER — ONE-DAILY MULTI VITAMINS PO TABS: 1.0000 | ORAL_TABLET | Freq: Every day | ORAL | Status: DC

## 2016-07-07 MED ORDER — MORPHINE SULFATE (PF) 2 MG/ML IV SOLN
1.0000 mg | INTRAVENOUS | Status: DC | PRN
  Administered 2016-07-07 – 2016-07-08 (×4): 2 mg via INTRAVENOUS
  Filled 2016-07-07 (×4): qty 1

## 2016-07-07 MED ORDER — LIDOCAINE HCL (CARDIAC) 20 MG/ML IV SOLN
INTRAVENOUS | Status: DC | PRN
Start: 2016-07-07 — End: 2016-07-07
  Administered 2016-07-07: 100 mg via INTRATRACHEAL

## 2016-07-07 MED ORDER — BACITRACIN 50000 UNITS IM SOLR
INTRAMUSCULAR | Status: DC | PRN
  Administered 2016-07-07: 15:00:00

## 2016-07-07 MED ORDER — CEFAZOLIN SODIUM-DEXTROSE 2-4 GM/100ML-% IV SOLN
2.0000 g | INTRAVENOUS | Status: AC
  Administered 2016-07-07: 2 g via INTRAVENOUS
  Filled 2016-07-07 (×2): qty 100

## 2016-07-07 MED ORDER — GADOBENATE DIMEGLUMINE 529 MG/ML IV SOLN
20.0000 mL | Freq: Once | INTRAVENOUS | Status: AC
  Administered 2016-07-07: 18 mL via INTRAVENOUS

## 2016-07-07 MED ORDER — LIDOCAINE-EPINEPHRINE (PF) 2 %-1:200000 IJ SOLN
INTRAMUSCULAR | Status: AC
  Filled 2016-07-07: qty 20

## 2016-07-07 MED ORDER — DEXAMETHASONE SODIUM PHOSPHATE 10 MG/ML IJ SOLN
6.0000 mg | Freq: Four times a day (QID) | INTRAMUSCULAR | Status: AC
  Administered 2016-07-07 – 2016-07-08 (×4): 6 mg via INTRAVENOUS
  Filled 2016-07-07 (×4): qty 1

## 2016-07-07 MED ORDER — THROMBIN 5000 UNITS EX SOLR
OROMUCOSAL | Status: DC | PRN
  Administered 2016-07-07 (×2): via TOPICAL

## 2016-07-07 MED ORDER — ONDANSETRON HCL 4 MG/2ML IJ SOLN
4.0000 mg | INTRAMUSCULAR | Status: DC | PRN
  Administered 2016-07-08: 4 mg via INTRAVENOUS
  Filled 2016-07-07 (×3): qty 2

## 2016-07-07 MED ORDER — ROCURONIUM BROMIDE 100 MG/10ML IV SOLN
INTRAVENOUS | Status: DC | PRN
  Administered 2016-07-07: 30 mg via INTRAVENOUS
  Administered 2016-07-07: 10 mg via INTRAVENOUS
  Administered 2016-07-07: 20 mg via INTRAVENOUS
  Administered 2016-07-07: 50 mg via INTRAVENOUS

## 2016-07-07 MED ORDER — LIDOCAINE-EPINEPHRINE (PF) 2 %-1:200000 IJ SOLN
INTRAMUSCULAR | Status: DC | PRN
  Administered 2016-07-07: 6 mL

## 2016-07-07 MED ORDER — CHLORHEXIDINE GLUCONATE CLOTH 2 % EX PADS: 6.0000 | MEDICATED_PAD | Freq: Once | CUTANEOUS | Status: DC

## 2016-07-07 MED ORDER — DEXAMETHASONE SODIUM PHOSPHATE 4 MG/ML IJ SOLN
4.0000 mg | Freq: Three times a day (TID) | INTRAMUSCULAR | Status: DC
  Administered 2016-07-09 – 2016-07-11 (×6): 4 mg via INTRAVENOUS
  Filled 2016-07-07 (×6): qty 1

## 2016-07-07 MED ORDER — SODIUM CHLORIDE 0.9 % IR SOLN
Status: DC | PRN
  Administered 2016-07-07: 1000 mL

## 2016-07-07 MED ORDER — BUPIVACAINE HCL (PF) 0.5 % IJ SOLN
INTRAMUSCULAR | Status: AC
  Filled 2016-07-07: qty 30

## 2016-07-07 MED ORDER — CHLORHEXIDINE GLUCONATE CLOTH 2 % EX PADS
6.0000 | MEDICATED_PAD | Freq: Once | CUTANEOUS | Status: AC
  Administered 2016-07-07: 6 via TOPICAL

## 2016-07-07 MED ORDER — VITAMIN D 50 MCG (2000 UT) PO TABS: 2000.0000 [IU] | ORAL_TABLET | Freq: Every day | ORAL | Status: DC

## 2016-07-07 MED ORDER — FENTANYL CITRATE (PF) 100 MCG/2ML IJ SOLN
INTRAMUSCULAR | Status: DC | PRN
  Administered 2016-07-07: 100 ug via INTRAVENOUS
  Administered 2016-07-07: 200 ug via INTRAVENOUS

## 2016-07-07 MED ORDER — BACITRACIN ZINC 500 UNIT/GM EX OINT
TOPICAL_OINTMENT | CUTANEOUS | Status: AC
  Filled 2016-07-07: qty 28.35

## 2016-07-07 MED ORDER — 0.9 % SODIUM CHLORIDE (POUR BTL) OPTIME
TOPICAL | Status: DC | PRN
Start: 2016-07-07 — End: 2016-07-07
  Administered 2016-07-07 (×3): 1000 mL

## 2016-07-07 MED ORDER — FENTANYL CITRATE (PF) 100 MCG/2ML IJ SOLN
INTRAMUSCULAR | Status: AC
  Filled 2016-07-07: qty 2

## 2016-07-07 MED ORDER — ALLOPURINOL 100 MG PO TABS
300.0000 mg | ORAL_TABLET | Freq: Every day | ORAL | Status: DC
  Administered 2016-07-07 – 2016-07-11 (×5): 300 mg via ORAL
  Filled 2016-07-07 (×4): qty 1
  Filled 2016-07-07: qty 3

## 2016-07-07 MED ORDER — PRAVASTATIN SODIUM 40 MG PO TABS: 40.0000 mg | ORAL_TABLET | Freq: Every day | ORAL | Status: DC

## 2016-07-07 MED ORDER — BUPIVACAINE HCL (PF) 0.5 % IJ SOLN
INTRAMUSCULAR | Status: DC | PRN
  Administered 2016-07-07: 6 mL

## 2016-07-07 MED ORDER — DEXAMETHASONE SODIUM PHOSPHATE 10 MG/ML IJ SOLN
INTRAMUSCULAR | Status: AC
  Filled 2016-07-07: qty 1

## 2016-07-07 MED ORDER — LIDOCAINE 2% (20 MG/ML) 5 ML SYRINGE
INTRAMUSCULAR | Status: AC
  Filled 2016-07-07: qty 5

## 2016-07-07 MED ORDER — SUGAMMADEX SODIUM 500 MG/5ML IV SOLN
INTRAVENOUS | Status: DC | PRN
  Administered 2016-07-07: 300 mg via INTRAVENOUS

## 2016-07-07 MED ORDER — BISACODYL 10 MG RE SUPP: 10.0000 mg | Freq: Every day | RECTAL | Status: DC | PRN

## 2016-07-07 MED ORDER — PHENYLEPHRINE HCL 10 MG/ML IJ SOLN
INTRAMUSCULAR | Status: DC | PRN
  Administered 2016-07-07: 20 ug/min via INTRAVENOUS

## 2016-07-07 MED ORDER — THROMBIN 20000 UNITS EX SOLR
CUTANEOUS | Status: AC
Start: 2016-07-07 — End: 2016-07-07
  Filled 2016-07-07: qty 20000

## 2016-07-07 MED ORDER — ONDANSETRON HCL 4 MG/2ML IJ SOLN
INTRAMUSCULAR | Status: DC | PRN
  Administered 2016-07-07: 4 mg via INTRAVENOUS

## 2016-07-07 SURGICAL SUPPLY — 87 items
BANDAGE GAUZE 4  KLING STR (GAUZE/BANDAGES/DRESSINGS) ×4 IMPLANT
BATTERY IQ STERILE (MISCELLANEOUS) ×4 IMPLANT
BENZOIN TINCTURE PRP APPL 2/3 (GAUZE/BANDAGES/DRESSINGS) IMPLANT
BLADE CLIPPER SURG (BLADE) ×4 IMPLANT
BNDG GAUZE ELAST 4 BULKY (GAUZE/BANDAGES/DRESSINGS) ×8 IMPLANT
BUR ACORN 6.0 PRECISION (BURR) ×3 IMPLANT
BUR ACORN 6.0MM PRECISION (BURR) ×1
BUR ROUND FLUTED 4 SOFT TCH (BURR) IMPLANT
BUR ROUND FLUTED 4MM SOFT TCH (BURR)
BUR SPIRAL ROUTER 2.3 (BUR) ×3 IMPLANT
BUR SPIRAL ROUTER 2.3MM (BUR) ×1
CANISTER SUCT 3000ML PPV (MISCELLANEOUS) ×8 IMPLANT
CARTRIDGE OIL MAESTRO DRILL (MISCELLANEOUS) ×2 IMPLANT
CONT SPEC 4OZ CLIKSEAL STRL BL (MISCELLANEOUS) ×4 IMPLANT
DECANTER SPIKE VIAL GLASS SM (MISCELLANEOUS) ×4 IMPLANT
DIFFUSER DRILL AIR PNEUMATIC (MISCELLANEOUS) ×4 IMPLANT
DRAPE HALF SHEET 40X57 (DRAPES) ×4 IMPLANT
DRAPE MICROSCOPE LEICA (MISCELLANEOUS) ×4 IMPLANT
DRAPE NEUROLOGICAL W/INCISE (DRAPES) ×4 IMPLANT
DRAPE SURG 17X23 STRL (DRAPES) IMPLANT
DRAPE WARM FLUID 44X44 (DRAPE) ×4 IMPLANT
DRSG ADAPTIC 3X8 NADH LF (GAUZE/BANDAGES/DRESSINGS) IMPLANT
DRSG TELFA 3X8 NADH (GAUZE/BANDAGES/DRESSINGS) ×4 IMPLANT
DURAMATRIX ONLAY 3X3 (Plate) ×4 IMPLANT
DURAPREP 6ML APPLICATOR 50/CS (WOUND CARE) ×4 IMPLANT
ELECT REM PT RETURN 9FT ADLT (ELECTROSURGICAL) ×4
ELECTRODE REM PT RTRN 9FT ADLT (ELECTROSURGICAL) ×2 IMPLANT
EVACUATOR 1/8 PVC DRAIN (DRAIN) IMPLANT
EVACUATOR SILICONE 100CC (DRAIN) IMPLANT
FORCEPS BIPOLAR SPETZLER 8 1.0 (NEUROSURGERY SUPPLIES) ×4 IMPLANT
GAUZE SPONGE 4X4 12PLY STRL (GAUZE/BANDAGES/DRESSINGS) ×4 IMPLANT
GAUZE SPONGE 4X4 16PLY XRAY LF (GAUZE/BANDAGES/DRESSINGS) IMPLANT
GLOVE BIOGEL PI IND STRL 7.0 (GLOVE) ×2 IMPLANT
GLOVE BIOGEL PI IND STRL 7.5 (GLOVE) ×2 IMPLANT
GLOVE BIOGEL PI IND STRL 8 (GLOVE) ×2 IMPLANT
GLOVE BIOGEL PI INDICATOR 7.0 (GLOVE) ×2
GLOVE BIOGEL PI INDICATOR 7.5 (GLOVE) ×2
GLOVE BIOGEL PI INDICATOR 8 (GLOVE) ×2
GLOVE SS N UNI LF 8.0 STRL (GLOVE) ×4 IMPLANT
GOWN STRL REUS W/ TWL LRG LVL3 (GOWN DISPOSABLE) ×4 IMPLANT
GOWN STRL REUS W/ TWL XL LVL3 (GOWN DISPOSABLE) ×2 IMPLANT
GOWN STRL REUS W/TWL 2XL LVL3 (GOWN DISPOSABLE) ×8 IMPLANT
GOWN STRL REUS W/TWL LRG LVL3 (GOWN DISPOSABLE) ×4
GOWN STRL REUS W/TWL XL LVL3 (GOWN DISPOSABLE) ×2
HEMOSTAT POWDER KIT SURGIFOAM (HEMOSTASIS) ×8 IMPLANT
IV NS 1000ML (IV SOLUTION) ×2
IV NS 1000ML BAXH (IV SOLUTION) ×2 IMPLANT
KIT BASIN OR (CUSTOM PROCEDURE TRAY) ×4 IMPLANT
KIT ROOM TURNOVER OR (KITS) ×4 IMPLANT
KNIFE ARACHNOID DISP AM-24-S (MISCELLANEOUS) ×4 IMPLANT
MARKER SPHERE PSV REFLC 13MM (MARKER) ×8 IMPLANT
NEEDLE HYPO 25X1 1.5 SAFETY (NEEDLE) ×4 IMPLANT
NEEDLE SPNL 18GX3.5 QUINCKE PK (NEEDLE) IMPLANT
NS IRRIG 1000ML POUR BTL (IV SOLUTION) ×12 IMPLANT
OIL CARTRIDGE MAESTRO DRILL (MISCELLANEOUS) ×4
PACK CRANIOTOMY (CUSTOM PROCEDURE TRAY) ×4 IMPLANT
PATTIES SURGICAL .25X.25 (GAUZE/BANDAGES/DRESSINGS) IMPLANT
PATTIES SURGICAL .5 X.5 (GAUZE/BANDAGES/DRESSINGS) IMPLANT
PATTIES SURGICAL .5 X3 (DISPOSABLE) IMPLANT
PATTIES SURGICAL 1/4 X 3 (GAUZE/BANDAGES/DRESSINGS) IMPLANT
PATTIES SURGICAL 1X1 (DISPOSABLE) IMPLANT
PIN MAYFIELD SKULL DISP (PIN) ×4 IMPLANT
PLATE 1.5/0.5 13MM BURR HOLE (Plate) ×4 IMPLANT
PLATE 1.5/0.5 18.5MM BURR HOLE (Plate) ×8 IMPLANT
RUBBERBAND STERILE (MISCELLANEOUS) ×8 IMPLANT
SCREW SELF DRILL HT 1.5/4MM (Screw) ×52 IMPLANT
SET TUBING W/EXT DISP (INSTRUMENTS) ×4 IMPLANT
SPECIMEN JAR SMALL (MISCELLANEOUS) IMPLANT
SPONGE NEURO XRAY DETECT 1X3 (DISPOSABLE) IMPLANT
SPONGE SURGIFOAM ABS GEL 100 (HEMOSTASIS) ×4 IMPLANT
SPONGE SURGIFOAM ABS GEL SZ50 (HEMOSTASIS) ×4 IMPLANT
STAPLER VISISTAT 35W (STAPLE) ×4 IMPLANT
STOCKINETTE 6  STRL (DRAPES)
STOCKINETTE 6 STRL (DRAPES) IMPLANT
SUT ETHILON 3 0 FSL (SUTURE) IMPLANT
SUT NURALON 4 0 TR CR/8 (SUTURE) ×12 IMPLANT
SUT SILK 0 TIES 10X30 (SUTURE) IMPLANT
SUT VIC AB 0 CT1 18XCR BRD8 (SUTURE) ×4 IMPLANT
SUT VIC AB 0 CT1 8-18 (SUTURE) ×4
SUT VIC AB 3-0 SH 8-18 (SUTURE) ×8 IMPLANT
TIP STRAIGHT 25KHZ (INSTRUMENTS) ×4 IMPLANT
TOWEL OR 17X24 6PK STRL BLUE (TOWEL DISPOSABLE) ×4 IMPLANT
TOWEL OR 17X26 10 PK STRL BLUE (TOWEL DISPOSABLE) ×4 IMPLANT
TUBE CONNECTING 12'X1/4 (SUCTIONS) ×1
TUBE CONNECTING 12X1/4 (SUCTIONS) ×3 IMPLANT
UNDERPAD 30X30 (UNDERPADS AND DIAPERS) ×4 IMPLANT
WATER STERILE IRR 1000ML POUR (IV SOLUTION) ×4 IMPLANT

## 2016-07-07 NOTE — Progress Notes (Signed)
PROGRESS NOTE  Crystal Parsons  [id number] DOB: 1945/12/30 DOA: 06/30/2016 PCP: Irven Shelling, MD Outpatient Specialists:  Subjective: No complaints, seen with husband and daughter at bedside. Neurosurgery will take over after the removal of the brain tumor  Brief Narrative:  70 year old female with PMH significant for HTN and gout. She was recently diagnosed with gout and treated with colchicine, allopurinol, and a short course of prednisone. Since that time she had been improving. 11/28 overnight she had 2 seizures. She was seizing en route and in ED and was promptly intubated for airway protection. PCCM to see.   Assessment & Plan:   Active Problems:   Seizure ([place])   Status epilepticus ([place])   Acute respiratory failure with hypoxia ([facility])   Brain mass   Seizure -Presented with 2 episodes of seizure, one is witnessed by ED staff. -EEG done on 12/1, no report on Epic likely report is pending. -Patient has right frontal lobe mass likely causing the seizure, currently on Keppra, no recent seizure like activity.  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 -On antiepileptic, steroid and going for surgery today.  Acute respiratory failure with hypoxia -Patient intubated upon admission to the ED for airway protection. -Patient was out of it postictally, after extubation did very well.  DVT prophylaxis:  Code Status: Full Code Family Communication:  Disposition Plan:  Diet: Diet NPO time specified  Consultants:   Neuro  Procedures:   Intubation/extubation  Antimicrobials:   None   Objective: Vitals:   07/06/16 0500 07/06/16 1400 07/06/16 2039 07/07/16 0436  BP:  (!) 132/55 (!) 142/52 (!) 108/47  Pulse:  84 80 67  Resp:  18  18  Temp:  97.4 F (36.3 C) 97.9 F (36.6 C) 97.8 F (36.6 C)  TempSrc:   Oral Oral Oral  SpO2:  97% 95% 96%  Weight: 83.3 kg (183 lb 9.6 oz)   86.5 kg (190 lb 11.2 oz)  Height:        Intake/Output Summary (Last 24 hours) at 07/07/16 1249 Last data filed at 07/07/16 1031  Gross per 24 hour  Intake           737.83 ml  Output              600 ml  Net           137.83 ml   Filed Weights   07/05/16 0500 07/06/16 0500 07/07/16 0436  Weight: 84.6 kg (186 lb 8.2 oz) 83.3 kg (183 lb 9.6 oz) 86.5 kg (190 lb 11.2 oz)    Examination: General exam: Appears calm and comfortable  Respiratory system: Clear to auscultation. Respiratory effort normal. Cardiovascular system: S1 & S2 heard, RRR. No JVD, murmurs, rubs, gallops or clicks. No pedal edema. Gastrointestinal system: Abdomen is nondistended, soft and nontender. No organomegaly or masses felt. Normal bowel sounds heard. Central nervous system: Alert and oriented. No focal neurological deficits. Extremities: Symmetric 5 x 5 power. Skin: No rashes, lesions or ulcers Psychiatry: Judgement and insight appear normal. Mood & affect appropriate.   Data Reviewed: I have personally reviewed following labs and imaging studies  CBC:  Recent Labs Lab 06/30/16 2223  07/01/16 0359 07/02/16 0444 07/03/16 0534 07/04/16 0446 07/06/16 0535  WBC 20.4*  --  16.8* 12.6* 14.3* 11.4* 12.1*  NEUTROABS 9.2*  --   --  9.3*  --   --   --   HGB 14.0  < > 12.2 11.9* 11.7* 11.0* 13.2  HCT 42.1  < > 36.2 36.4 35.8* 33.2* 38.0  MCV 93.6  --  89.4 92.4 92.7 91.5 88.8  PLT 365  --  303 250 239 244 334  < > = values in this interval not displayed. Basic Metabolic Panel:  Recent Labs Lab 07/01/16 1011 07/01/16 1812 07/02/16 0444 07/02/16 1613 07/03/16 0534 07/04/16 0446 07/05/16 0256 07/06/16 0535  NA 138  --  143  --  144 139 140 142  K 4.0  --  3.6  --  3.9 3.4* 3.7 3.4*  CL 107  --  113*  --  114* 109 110 105  CO2 22  --  22  --  21* 21* 21* 21*  GLUCOSE 122*  --  110*  --  108* 103* 123* 109*  BUN 11  --  19  --   14 15 15 15   CREATININE 1.05*  --  1.08*  --  0.91 0.83 0.83 0.84  CALCIUM 9.4  --  9.0  --  9.2 9.0 9.1 9.9  MG 2.3 2.3 2.2 2.2  --   --  1.8  --   PHOS 4.4 5.0* 4.6 3.7  --   --  4.5  --    GFR: Estimated Creatinine Clearance: 64 mL/min (by C-G formula based on SCr of 0.84 mg/dL). Liver Function Tests: No results for input(s): AST, ALT, ALKPHOS, BILITOT, PROT, ALBUMIN in the last 168 hours. No results for input(s): LIPASE, AMYLASE in the last 168 hours. No results for input(s): AMMONIA in the last 168 hours. Coagulation Profile: No results for input(s): INR, PROTIME in the last 168 hours. Cardiac Enzymes:  Recent Labs Lab 06/30/16 2349  CKTOTAL 60   BNP (last 3 results) No results for input(s): PROBNP in the last 8760 hours. HbA1C: No results for input(s): HGBA1C in the last 72 hours. CBG:  Recent Labs Lab 07/06/16 1124 07/06/16 2009 07/07/16 0002 07/07/16 0433 07/07/16 0745  GLUCAP 157* 108* 143* 139* 119*   Lipid Profile:  Recent Labs  07/06/16 0535  TRIG 147   Thyroid Function Tests: No results for input(s): TSH, T4TOTAL, FREET4, T3FREE, THYROIDAB in the last 72 hours. Anemia Panel: No results for input(s): VITAMINB12, FOLATE, FERRITIN, TIBC, IRON, RETICCTPCT in the last 72 hours. Urine analysis:    Component Value Date/Time   COLORURINE YELLOW 07/01/2016 1024   APPEARANCEUR CLEAR 07/01/2016 1024   LABSPEC 1.008 07/01/2016 1024   PHURINE 5.5 07/01/2016 1024   GLUCOSEU NEGATIVE 07/01/2016 1024   HGBUR NEGATIVE 07/01/2016 1024   BILIRUBINUR NEGATIVE 07/01/2016 1024   KETONESUR NEGATIVE 07/01/2016 1024   PROTEINUR NEGATIVE 07/01/2016 1024   NITRITE NEGATIVE 07/01/2016 1024   LEUKOCYTESUR NEGATIVE 07/01/2016 1024   Sepsis Labs: @LABRCNTIP (procalcitonin:4,lacticidven:4)  ) Recent Results (from the past 240 hour(s))  MRSA PCR Screening     Status: None   Collection Time: 07/01/16  1:46 AM  Result Value Ref Range Status   MRSA by PCR NEGATIVE  NEGATIVE Final    Comment:        The GeneXpert MRSA Assay (FDA approved for NASAL specimens only), is one component of a comprehensive MRSA colonization surveillance program. It is not intended to diagnose MRSA infection nor to guide or monitor treatment for MRSA infections.   Surgical pcr screen     Status: None   Collection Time: 07/06/16 11:21 PM  Result Value  Ref Range Status   MRSA, PCR NEGATIVE NEGATIVE Final   Staphylococcus aureus NEGATIVE NEGATIVE Final    Comment:        The Xpert SA Assay (FDA approved for NASAL specimens in patients over 88 years of age), is one component of a comprehensive surveillance program.  Test performance has been validated by [facility] for patients greater than or equal to 86 year old. It is not intended to diagnose infection nor to guide or monitor treatment.      Invalid input(s): PROCALCITONIN, Saline   Radiology Studies: No results found.      Scheduled Meds: . [MAR Hold] allopurinol  300 mg Oral Daily  .  ceFAZolin (ANCEF) IV  2 g Intravenous To OR  . Chlorhexidine Gluconate Cloth  6 each Topical Once  . [MAR Hold] cholecalciferol  2,000 Units Oral Daily  . [MAR Hold] famotidine  20 mg Oral BID  . [MAR Hold] hydrALAZINE  25 mg Oral Q8H  . [MAR Hold] hydrochlorothiazide  12.5 mg Oral Daily  . [MAR Hold] levETIRAcetam  1,000 mg Intravenous Q12H  . [MAR Hold] losartan  50 mg Oral Daily  . [MAR Hold] mouth rinse  15 mL Mouth Rinse BID  . [MAR Hold] multivitamin with minerals  1 tablet Oral Daily  . [MAR Hold] pravastatin  40 mg Oral q1800  . [MAR Hold] predniSONE  20 mg Oral BID WC   Continuous Infusions: . sodium chloride 10 mL/hr at 07/05/16 1147     LOS: 7 days    Time spent: 35 minutes    Kenyana Husak A, MD [facility] Pager [phone]  If 7PM-7AM, please contact night-coverage [email or web address] Password [facility] 07/07/2016, 12:49 PM

## 2016-07-07 NOTE — Anesthesia Preprocedure Evaluation (Addendum)
Anesthesia Evaluation  Patient identified by MRN, date of birth, ID band Patient awake    Reviewed: Allergy & Precautions, NPO status , Patient's Chart, lab work & pertinent test results  Airway Mallampati: I   Neck ROM: Full    Dental  (+) Teeth Intact   Pulmonary    Pulmonary exam normal breath sounds clear to auscultation       Cardiovascular hypertension, Pt. on medications Normal cardiovascular exam Rhythm:Regular     Neuro/Psych Seizures -, Well Controlled,  Anxiety    GI/Hepatic   Endo/Other  diabetes, Well Controlled, Type 2  Renal/GU      Musculoskeletal   Abdominal   Peds  Hematology   Anesthesia Other Findings   Reproductive/Obstetrics                            Anesthesia Physical Anesthesia Plan  ASA: III  Anesthesia Plan: General   Post-op Pain Management:    Induction: Intravenous  Airway Management Planned: Oral ETT  Additional Equipment: Arterial line and CVP  Intra-op Plan:   Post-operative Plan: Extubation in OR  Informed Consent: I have reviewed the patients History and Physical, chart, labs and discussed the procedure including the risks, benefits and alternatives for the proposed anesthesia with the patient or authorized representative who has indicated his/her understanding and acceptance.   Dental advisory given  Plan Discussed with: CRNA and Surgeon  Anesthesia Plan Comments:        Anesthesia Quick Evaluation

## 2016-07-07 NOTE — Transfer of Care (Signed)
Immediate Anesthesia Transfer of Care Note  Patient: Crystal Parsons  Procedure(s) Performed: Procedure(s): CRANIOTOMY TUMOR EXCISION w/BrainLab (Right) APPLICATION OF CRANIAL NAVIGATION (Right)  Patient Location: PACU  Anesthesia Type:General  Level of Consciousness: awake, alert  and oriented  Airway & Oxygen Therapy: Patient Spontanous Breathing and Patient connected to nasal cannula oxygen  Post-op Assessment: Report given to RN, Post -op Vital signs reviewed and stable and Patient moving all extremities X 4  Post vital signs: Reviewed and stable  Last Vitals:  Vitals:   07/07/16 0436 07/07/16 1600  BP: (!) 108/47   Pulse: 67   Resp: 18   Temp: 36.6 C 36.5 C    Last Pain:  Vitals:   07/07/16 1600  TempSrc:   PainSc: (P) 0-No pain      Patients Stated Pain Goal: 2 ([id number])  Complications: No apparent anesthesia complications

## 2016-07-07 NOTE — Anesthesia Procedure Notes (Signed)
Procedure Name: Intubation Date/Time: 07/07/2016 12:36 PM Performed by: Mariea Clonts Pre-anesthesia Checklist: Patient identified, Emergency Drugs available, Suction available and Patient being monitored Patient Re-evaluated:Patient Re-evaluated prior to inductionOxygen Delivery Method: Circle System Utilized Preoxygenation: Pre-oxygenation with 100% oxygen Intubation Type: IV induction Ventilation: Mask ventilation without difficulty Laryngoscope Size: Miller and 2 Tube type: Subglottic suction tube Tube size: 7.0 mm Number of attempts: 1 Airway Equipment and Method: Stylet and Oral airway Placement Confirmation: ETT inserted through vocal cords under direct vision,  positive ETCO2 and breath sounds checked- equal and bilateral Tube secured with: Tape Dental Injury: Teeth and Oropharynx as per pre-operative assessment

## 2016-07-07 NOTE — Progress Notes (Signed)
No issues overnight. No complaints this am except some discomfort of the right index DIP joint from gout.  EXAM:  BP (!) 108/47 (BP Location: Left Arm)   Pulse 67   Temp 97.8 F (36.6 C) (Oral)   Resp 18   Ht 5\' 3"  (1.6 m)   Wt 86.5 kg (190 lb 11.2 oz)   SpO2 96%   BMI 33.78 kg/m   Awake, alert, oriented  Speech fluent, appropriate  CN grossly intact  5/5 BUE/BLE x mild distal LUE weakness  IMPRESSION:  70 y.o. 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 All questions were answered. She is ready to proceed.

## 2016-07-07 NOTE — Anesthesia Postprocedure Evaluation (Signed)
Anesthesia Post Note  Patient: Ezinne D Wargo  Procedure(s) Performed: Procedure(s) (LRB): CRANIOTOMY TUMOR EXCISION w/BrainLab (Right) APPLICATION OF CRANIAL NAVIGATION (Right)  Patient location during evaluation: PACU Anesthesia Type: General Level of consciousness: awake and alert Pain management: pain level controlled Vital Signs Assessment: post-procedure vital signs reviewed and stable Respiratory status: spontaneous breathing, nonlabored ventilation, respiratory function stable and patient connected to nasal cannula oxygen Cardiovascular status: blood pressure returned to baseline and stable Postop Assessment: no signs of nausea or vomiting Anesthetic complications: no    Last Vitals:  Vitals:   07/07/16 1700 07/07/16 1745  BP:  (!) 145/61  Pulse: (!) 104 (!) 101  Resp: 12 17  Temp: 36.8 C     Last Pain:  Vitals:   07/07/16 1700  TempSrc: Oral  PainSc:                  Amillia Biffle DAVID

## 2016-07-07 NOTE — Anesthesia Procedure Notes (Addendum)
Central Venous Catheter Insertion Performed by: anesthesiologist 07/07/2016 12:45 PM Preanesthetic checklist: patient identified, IV checked, risks and benefits discussed, surgical consent, monitors and equipment checked, pre-op evaluation, timeout performed and anesthesia consent Position: Trendelenburg Landmarks identified and Seldinger technique used Catheter size: 7.5 Fr Central line was placed.Double lumen Procedure performed using ultrasound guided technique. Attempts: 1 Following insertion, line sutured, dressing applied and Biopatch. Post procedure assessment: blood return through all ports, free fluid flow and no air. Patient tolerated the procedure well with no immediate complications.

## 2016-07-07 NOTE — Progress Notes (Signed)
[place] Progress Note Patient Name: [name] DOB: 05/11/1946 MRN: [id number]   Date of Service  07/07/2016  HPI/Events of Note   Stereotactic right frontal craniotomy for resection of tumor done today/ pt comfortable on camera check RA/ nl vs  eICU Interventions  No interventions/ will monitor     Intervention Category Major Interventions: Other:  Crystal Parsons 07/07/2016, 5:17 PM

## 2016-07-07 NOTE — Progress Notes (Signed)
Nutrition Follow-up  DOCUMENTATION CODES:   Obesity unspecified  INTERVENTION:   -Continue MVI -Resume dysphagia 3 diet with thin liquids as medically appropriate  NUTRITION DIAGNOSIS:   Inadequate oral intake related to inability to eat as evidenced by NPO status.  Progressing  GOAL:   Patient will meet greater than or equal to 90% of their needs  Progressing  MONITOR:   PO intake, Skin, I & O's, Labs, Weight trends  REASON FOR ASSESSMENT:   Ventilator, Consult Enteral/tube feeding initiation and management  ASSESSMENT:   70 year old female with PMH significant for HTN and gout. She was recently diagnosed with gout and treated with colchicine, allopurinol, and a short course of prednisone. Since that time she had been improving. 11/28 overnight she had 2 seizures. She was seizing en route and in ED and was promptly intubated for airway protection.   12/5- per SLP note, continue dysphagia 3 diet with thin liquids 12/5- transfer from ICU to medical floor  Pt NPO and down for tumor resection at time of visit. Previously, pt was receiving a dysphagia 3 diet with thin liquids. Pt continues to have a good appetite, cosnuming 50-75% of meals.   Labs reviewed: CBGS: 108-143.   Diet Order:  Diet NPO time specified  Skin:  Reviewed, no issues  Last BM:  07/05/16  Height:   Ht Readings from Last 1 Encounters:  07/03/16 5\' 3"  (1.6 m)    Weight:   Wt Readings from Last 1 Encounters:  07/07/16 190 lb 11.2 oz (86.5 kg)    Ideal Body Weight:  54.5 kg  BMI:  Body mass index is 33.78 kg/m.  Estimated Nutritional Needs:   Kcal:  1700-1900  Protein:  100-110 grams  Fluid:  1.7-1.9 L/day  EDUCATION NEEDS:   No education needs identified at this time  Bernadean Saling A. Jimmye Norman, RD, LDN, CDE Pager: [phone] After hours Pager: [phone]

## 2016-07-07 NOTE — Op Note (Signed)
PREOP DIAGNOSIS:  1. Right frontal tumor   POSTOP DIAGNOSIS: Same  PROCEDURE: 1. Stereotactic right frontal craniotomy for resection of tumor 2. use of intraoperative microscope for microdissection  SURGEON: Dr. Consuella Lose, MD  ASSISTANT: Dr. Kary Kos, M.D.  ANESTHESIA: General Endotracheal  EBL: 200cc  SPECIMENS: Right frontal tumor for permanent pathology  DRAINS: None  COMPLICATIONS: None immediate  CONDITION: Hemodynamically stable to PACU  HISTORY: Crystal Parsons is a 70 y.o. female initially admitted through the emergency department with seizure. Workup included CT scan which demonstrated a right frontal mass, and MRI confirmed a multicystic peripherally enhancing right frontal mass with a daughter lesion more posteriorly close to the motor strip. There was a significant amount of mass effect. Differential diagnosis includes high-grade glioma. With her clinical condition and radiologic findings, surgical resection for diagnosis and relief of mass effect was indicated. The risks and benefits of the surgery were reviewed in detail with both the patient and her family.   PROCEDURE IN DETAIL: After informed consent was obtained and witnessed, the patient was brought to the operating room. After induction of general anesthesia, the patient was positioned on the operative table in [place] headholder in the supine position. All pressure points were meticulously padded.  utilizing the preoperative stereotactic MRI scan, surface markers were co-registered until satisfactory accuracy was achieved. A standard curvilinear incision was then planned out to allow craniotomy which would give access to the entire tumor. Skin incision was then marked out and prepped and draped in the usual sterile fashion.  After timeout was conducted, incision was infiltrated with local anesthetic with epinephrine. Skin incision was then made sharply and carried through the galea. Raney clips were  applied. The skin and temporalis muscle were then elevated as a single piece and reflected anteriorly. Utilizing the stereotactic system, a craniotomy was planned out to allow access to the underlying tumor. Bur holes were then created and connected with the craniotome, and a single piece craniotomy flap was elevated. Hemostasis was secured on the epidural surface with bipolar electrocautery and morcellized Gelfoam and thrombin.   Dura was then opened. The stereotactic system was then used to identify the point where the tumor appeared to come closest to the pial surface. The microscope was then draped sterilely and brought into the field, and the remainder of the case was done under the microscope using microdissection. The pia was coagulated and incised, and using a combination of suction, bipolar electrocautery, and the ultrasonic aspirator at very low settings, white matter was dissected until the tumor was identified. The tumor was noted to be cystic, slightly darker in color, and a little bit more firm than the surrounding white matter. The ultrasonic aspirator primarily was used to dissect the tumor away from the surrounding white matter, initially in the medial, anterior, lateral, and posterior planes. Finally, the tumor was dissected in the deep plane, and the right frontal horn was identified and opened as the last portion of tumor was dissected away. The tumor was then removed en bloc and sent for permanent pathology.  At this point, the margins of the resection were inspected, and no identifiable tumor remaining. The resection cavity was then irrigated with copious amounts of normal saline irrigation, and hemostasis was secured with morcellized Gelfoam with thrombin and bipolar electrocautery. A 1 x 1" piece of Gelfoam was then used to cover the defect into the right frontal horn.  The dural defect was then covered with a piece of collagen dura matrix. Bone  flap was then replaced and plated with  standard titanium plates and screws. Wound was again irrigated with normal saline. Temporalis was approximated with 0 Vicryl stitches, and the galea was approximated with interrupted 3-0 Vicryl stitches. Skin was closed with staples. Bacitracin ointment, sterile dressings, and a head wrap was applied after the [place] head holder was removed.  The patient was then next abated, transferred to the stretcher, and taken to the postanesthesia care unit in stable hemodynamic condition. At the end of the case all sponge, needle, instrument, and cottonoid counts were correct.

## 2016-07-08 ENCOUNTER — Encounter (HOSPITAL_COMMUNITY): Payer: Self-pay | Admitting: Neurosurgery

## 2016-07-08 DIAGNOSIS — G8194 Hemiplegia, unspecified affecting left nondominant side: Secondary | ICD-10-CM

## 2016-07-08 LAB — GLUCOSE, CAPILLARY
GLUCOSE-CAPILLARY: 123 mg/dL — AB (ref 65–99)
GLUCOSE-CAPILLARY: 141 mg/dL — AB (ref 65–99)
Glucose-Capillary: 183 mg/dL — ABNORMAL HIGH (ref 65–99)
Glucose-Capillary: 133 mg/dL — ABNORMAL HIGH (ref 65–99)
Glucose-Capillary: 132 mg/dL — ABNORMAL HIGH (ref 65–99)
Glucose-Capillary: 144 mg/dL — ABNORMAL HIGH (ref 65–99)

## 2016-07-08 NOTE — Consult Note (Signed)
Physical Medicine and Rehabilitation Consult   Reason for Consult: Right frontal brain mass Referring Physician: Dr. Hartford Poli.    HPI: Crystal Parsons is a 70 y.o. female with history of HTN, recent gout flare who was admitted on 07/01/16 with multiple episode of seizures and requires intubation in ED for airway protection. 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 She was extubated without difficulty and was noted to have mild LUE weakness, cognitive deficits affecting processing, attention and memory. Surgical intervention recommended by Dr. Kathyrn Sheriff and she underwent stereotactic   right frontal crani for tumor resection on 12/7. Post op noted to have lethargy, nausea, apraxia as well as bradycardia affecting activity. CIR recommended for follow up therapy.    Review of Systems  HENT: Positive for hearing loss. Negative for ear pain and tinnitus.   Eyes: Negative for blurred vision and double vision.  Respiratory: Negative for cough, shortness of breath and wheezing.   Cardiovascular: Positive for palpitations.  Gastrointestinal: Positive for constipation and heartburn.  Genitourinary:       Gets up twice at nights.   Musculoskeletal: Positive for back pain. Negative for joint pain and myalgias.  Psychiatric/Behavioral: Positive for depression. The patient is nervous/anxious and has insomnia.       Past Medical History:  Diagnosis Date  . Anxiety   . Bradycardia    At times with pulse in the 40s  . Diabetes ([place])   . Diarrhea    With blating, improved with gluten-free diet  . Diastolic dysfunction   . Essential hypertension, benign    Always has HTN when at the doctor's office.  . Hyperlipidemia   . Mild aortic sclerosis ([place])   . Mitral valve problem    Mildly thickened mitral valve  . Mitral valve prolapse    Mild,  anterior  . MR (mitral regurgitation)    ECHO 08/10/09 shows mild MR again and normal EF. No significant changes from prior ECHO.  Marland Kitchen Palpitations    Occasional, but are not significant. ECHO 08/20/08 - Normal EF (60%), mildly thickened mitral valve with mild anterior mitral valve prolapse, mild MR, mild aortic sclerosis, grade 1 diastolic dysfunction.  . Proteinuria    Likely secondary to Diabetes    Past Surgical History:  Procedure Laterality Date  . ABDOMINAL HYSTERECTOMY    . APPLICATION OF CRANIAL NAVIGATION Right 07/07/2016   Procedure: APPLICATION OF CRANIAL NAVIGATION;  Surgeon: Consuella Lose, MD;  Location: [facility];  Service: Neurosurgery;  Laterality: Right;  . BREAST LUMPECTOMY Left   . CRANIOTOMY Right 07/07/2016   Procedure: CRANIOTOMY TUMOR EXCISION w/BrainLab;  Surgeon: Consuella Lose, MD;  Location: [facility];  Service: Neurosurgery;  Laterality: Right;  . HEMORROIDECTOMY    . TUBAL LIGATION      Family History  Problem Relation Age of Onset  . Cancer Mother   . Cancer Father     Social History:  Married. 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 She reports that she has never smoked. She has never used smokeless tobacco. She reports that she drinks alcohol--1-2 glasses of wine and/or bourbon/water at nights.  She reports that she does not use drugs.    Allergies  Allergen Reactions  . Accupril [Quinapril Hcl]     *ACE INHIBITORS*   . Ace Inhibitors   . Novocain [Procaine]   .  Reglan [Metoclopramide] Other (See Comments)    Muscle aches  . Tylenol [Acetaminophen] Other (See Comments)    No allergy to this drug per patient  . Zoloft [Sertraline Hcl] Other (See Comments)    Anxiety  . Naproxen Itching and Rash    Medications Prior to Admission  Medication Sig Dispense Refill  . allopurinol (ZYLOPRIM) 300 MG tablet Take 300 mg by mouth daily.    . Cholecalciferol (VITAMIN D) 2000 UNITS tablet Take 2,000 Units by mouth daily.    . colchicine 0.6 MG  tablet Take 1 tablet (0.6 mg total) by mouth daily. 30 tablet 0  . losartan-hydrochlorothiazide (HYZAAR) 50-12.5 MG tablet Take 1 tablet by mouth daily.     . Multiple Vitamin (MULTIVITAMIN) tablet Take 1 tablet by mouth daily.    . pravastatin (PRAVACHOL) 40 MG tablet Take 40 mg by mouth daily.     . cephALEXin (KEFLEX) 500 MG capsule Take 1 capsule (500 mg total) by mouth 3 (three) times daily. (Patient not taking: Reported on 06/30/2016) 21 capsule 0  . HYDROcodone-acetaminophen (NORCO) 10-325 MG tablet Take 1 tablet by mouth every 8 (eight) hours as needed. (Patient not taking: Reported on 06/30/2016) 20 tablet 0    Home: Home Living Family/patient expects to be discharged to:: Private residence Living Arrangements: Spouse/significant other Available Help at Discharge: Family, Available 24 hours/day Type of Home: House Home Layout: One level Bathroom Shower/Tub: Multimedia programmer: Standard Home Equipment: None  Lives With: Spouse  Functional History: Prior Function Level of Independence: Independent Functional Status:  Mobility: Bed Mobility Overal bed mobility: Needs Assistance Bed Mobility: Supine to Sit Supine to sit: Max assist, HOB elevated Sit to supine: Modified independent (Device/Increase time) General bed mobility comments: Difficulty initiating movement despite cues; assist with BLEs, trunk and to scoot bottom to EOB.  Transfers Overall transfer level: Needs assistance Equipment used: Rolling walker (2 wheeled) Transfers: Sit to/from Stand, [employer] Transfers Sit to Stand: Mod assist, +2 physical assistance Stand pivot transfers: Mod assist, +2 physical assistance General transfer comment: Assist to power to standing with cues for hand placement/technique. Difficulty sequencing/initiating movements. SPT bed to chair with Max verbal/tactile cues and assist with RW management to help with initiation of movements.  Ambulation/Gait Ambulation/Gait  assistance: Min guard Ambulation Distance (Feet): 130 Feet Assistive device: None Gait Pattern/deviations: Decreased stride length, Step-through pattern General Gait Details: 2 episodes of mild LOB but able to recover with steadying assist. Decreased awareness of surroundings in hallway noted requiring cues  Gait velocity: decreased    ADL: ADL Overall ADL's : Needs assistance/impaired Grooming: Supervision/safety, Brushing hair, Applying deodorant, Oral care, Standing Upper Body Bathing: Set up, Supervision/ safety, Standing Lower Body Bathing: Supervison/ safety, Sit to/from stand, Set up Upper Body Dressing : Supervision/safety, Sitting, Set up Lower Body Dressing: Minimal assistance, Sit to/from stand Lower Body Dressing Details (indicate cue type and reason): Assist for starting pants over L foot. Toilet Transfer: Supervision/safety, Ambulation, Regular Theatre manager and Hygiene: Supervision/safety, Sit to/from stand Tub/ Banker: Supervision/safety, Walk-in shower, Ambulation Functional mobility during ADLs: Supervision/safety General ADL Comments: Pt able to complete full ADL with supervision for safety. Pt able to gather items and perform higher level balance during functional activities without unstediness. Pt with mild SOB following shower and dressing.  Cognition: Cognition Overall Cognitive Status: Impaired/Different from baseline Arousal/Alertness: Awake/alert Orientation Level: Oriented X4 Attention: Divided Sustained Attention: Appears intact Divided Attention: Impaired Divided Attention Impairment: Verbal basic, Functional basic Memory: Appears  intact (8 on Cognistat WNL for her age) Awareness: Impaired Awareness Impairment: Anticipatory impairment, Emergent impairment, Intellectual impairment Problem Solving:  (verbal WFL, suspect difficulty w/ functional) Safety/Judgment: Impaired Cognition Arousal/Alertness:  Lethargic Behavior During Therapy: Flat affect Overall Cognitive Status: Impaired/Different from baseline Area of Impairment: Attention, Following commands Orientation Level: Person, Place, Situation, Time Current Attention Level: Focused Memory: Decreased short-term memory Following Commands: Follows one step commands with increased time (and multimodal cues) Safety/Judgement: Decreased awareness of deficits, Decreased awareness of safety Awareness: Emergent Problem Solving: Slow processing, Difficulty sequencing, Requires verbal cues, Decreased initiation, Requires tactile cues General Comments: Easily distracted. Difficulty sustaining attention to task requiring constant cues and stimulus. ABle to remember therapist's name in middle of session to introduce to family members.  Blood pressure (!) 144/58, pulse 74, temperature 98.3 F (36.8 C), temperature source Oral, resp. rate 14, height 5\' 3"  (1.6 m), weight 84 kg (185 lb 3 oz), SpO2 97 %. Physical Exam  Constitutional: No distress.  HENT:  Scalp with bandage  Eyes: Right eye exhibits no discharge. Left eye exhibits no discharge.  Neck: No tracheal deviation present. No thyromegaly present.  Cardiovascular: Normal rate.   Respiratory: Effort normal.  GI: She exhibits no distension.  Musculoskeletal: She exhibits edema.  Neurological:  RUE 4/5. LUE 2/5 prox to distal. RLE: 4/5 LLE: 2/5 Prox to 2+ distally. No gross sensory loss. Oriented to name, place, date  Psychiatric:  Flat, drowsy    Results for orders placed or performed during the hospital encounter of 06/30/16 (from the past 24 hour(s))  Glucose, capillary     Status: Abnormal   Collection Time: 07/07/16  3:57 PM  Result Value Ref Range   Glucose-Capillary 121 (H) 65 - 99 mg/dL  Glucose, capillary     Status: Abnormal   Collection Time: 07/07/16  8:08 PM  Result Value Ref Range   Glucose-Capillary 174 (H) 65 - 99 mg/dL  Glucose, capillary     Status: Abnormal    Collection Time: 07/07/16 11:24 PM  Result Value Ref Range   Glucose-Capillary 163 (H) 65 - 99 mg/dL  Glucose, capillary     Status: Abnormal   Collection Time: 07/08/16  2:48 AM  Result Value Ref Range   Glucose-Capillary 183 (H) 65 - 99 mg/dL  Glucose, capillary     Status: Abnormal   Collection Time: 07/08/16  8:07 AM  Result Value Ref Range   Glucose-Capillary 133 (H) 65 - 99 mg/dL   Mr Jeri Cos Wo Contrast  Result Date: 07/08/2016 CLINICAL DATA:  Status post craniotomy. 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 EXAM: MRI HEAD WITHOUT AND WITH CONTRAST TECHNIQUE: Multiplanar, multiecho pulse sequences of the brain and surrounding structures were obtained without and with intravenous contrast. CONTRAST:  27mL MULTIHANCE GADOBENATE DIMEGLUMINE 529 MG/ML IV SOLN COMPARISON:  MRI of the head July 01, 2016 FINDINGS: INTRACRANIAL CONTENTS: Interval RIGHT frontal resection cavity, no residual solid enhancing component within the primary frontal convexity tumor. Intrinsic T1 shortening along the surgical approach with susceptibility artifact extending to RIGHT basal ganglia. Small amount of layering blood products bilateral occipital horns and within the third ventricle. Local mass effect with surrounding vasogenic edema, no convincing evidence of marginal cytotoxic edema. 2 mm RIGHT to LEFT midline shift with partially effaced RIGHT frontal horn of lateral ventricle, no entrapment. The second subcentimeter peripherally enhancing nodule within the RIGHT posterior frontal centrum semiovale remains. Patchy white matter FLAIR T2 hyperintensities exclusive of the aforementioned abnormality compatible with mild chronic  small vessel ischemic disease. Small amount of RIGHT frontal extra-axial blood products. RIGHT dural enhancement consistent with recent surgery. VASCULAR: Normal major intracranial vascular flow voids present at skull base. SKULL AND UPPER CERVICAL SPINE: Status post RIGHT  frontal craniotomy. No abnormal sellar expansion. No suspicious calvarial bone marrow signal. Craniocervical junction maintained. RIGHT scalp soft tissue swelling with skin staples. SINUSES/ORBITS: Small LEFT sphenoid sinus air-fluid level. The included ocular globes and orbital contents are non-suspicious. OTHER: None. IMPRESSION: Interval RIGHT craniotomy for resection of dominant RIGHT frontal lobe tumor without convincing evidence of residual local disease. Intraventricular extension of blood products without hydrocephalus. 2 mm RIGHT to LEFT midline shift without ventricular entrapment. Residual subcentimeter RIGHT frontal lobe satellite nodule consistent with tumor. Electronically Signed   By: Elon Alas M.D.   On: 07/08/2016 02:04   Dg Chest Port 1 View  Result Date: 07/07/2016 CLINICAL DATA:  Central line placement EXAM: PORTABLE CHEST 1 VIEW COMPARISON:  July 04, 2016 FINDINGS: The new right central line terminates in the central SVC. No pneumothorax. No other interval change. IMPRESSION: New right central line in good position.  No pneumothorax. 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 Does the need for close, 24 hr/day medical supervision in concert with the patient's rehab needs make it unreasonable for this patient to be served in a less intensive setting? Yes 2. Co-Morbidities requiring supervision/potential complications: seizure 3. Due to bladder management, bowel management, safety, skin/wound care, disease management, medication administration, pain management and patient education, does the patient require 24 hr/day rehab nursing? Yes 4. Does the patient require coordinated care of a physician, rehab nurse, PT (1-2 hrs/day, 5 days/week), OT (1-2 hrs/day, 5 days/week) and SLP (1-2 hrs/day, 5 days/week) to address physical and functional deficits  in the context of the above medical diagnosis(es)? Yes Addressing deficits in the following areas: balance, endurance, locomotion, strength, transferring, bowel/bladder control, bathing, dressing, feeding, grooming, toileting, cognition, speech and psychosocial support 5. Can the patient actively participate in an intensive therapy program of at least 3 hrs of therapy per day at least 5 days per week? Yes 6. The potential for patient to make measurable gains while on inpatient rehab is excellent 7. Anticipated functional outcomes upon discharge from inpatient rehab are modified independent and supervision  with PT, modified independent and supervision with OT, modified independent with SLP. 8. Estimated rehab length of stay to reach the above functional goals is: 11-17 days 9. Does the patient have adequate social supports and living environment to accommodate these discharge functional goals? Yes 10. Anticipated D/C setting: Home 11. Anticipated post D/C treatments: HH therapy and Outpatient therapy 12. Overall Rehab/Functional Prognosis: good  RECOMMENDATIONS: This patient's condition is appropriate for continued rehabilitative care in the following setting: CIR Patient has agreed to participate in recommended program. N/A Note that insurance prior authorization may be required for reimbursement for recommended care.  Comment: Rehab Admissions Coordinator to follow up.  Thanks,  Meredith Staggers, MD, Tilford Pillar, PA-C 07/08/2016

## 2016-07-08 NOTE — Progress Notes (Signed)
Speech Language Pathology Treatment: Dysphagia;Cognitive-Linquistic  Patient Details Name: Crystal Parsons MRN: [id number] DOB: [birth year] Today's Date: 07/08/2016 Time: [id number] SLP Time Calculation (min) (ACUTE ONLY): 14 min  Assessment / Plan / Recommendation Clinical Impression  Treatment focused on cognition and dysphagia with husband present. Pt sleepy/groggy following surgery yesterday and pain meds requiring mod-max cues for basic functional temporal problem solving situations. SLP facilitated intervention for awareness of current impairments. Prolonged mastication and manipulation of solid bolus with mild left pocketing. RN stated pocketing with eggs this morning- educated pt to check and remove during and throughout meal. Function should improve and alertness increases.    HPI HPI: 70 year old female with PMH significant for HTN, DM, diastolic dysfunction, HTN and gout. Pt admitted with 2 seizures and found to have R Frontal Mass with plans for surgical intervention. Intubated 11/30-12/2.        SLP Plan  Continue with current plan of care     Recommendations  Diet recommendations: Dysphagia 3 (mechanical soft);Thin liquid Liquids provided via: Cup;Straw Medication Administration: Whole meds with puree Supervision: Patient able to self feed;Intermittent supervision to cue for compensatory strategies Compensations: Slow rate;Small sips/bites;Lingual sweep for clearance of pocketing Postural Changes and/or Swallow Maneuvers: Seated upright 90 degrees                General recommendations: Rehab consult Oral Care Recommendations: Oral care BID Follow up Recommendations: Inpatient Rehab Plan: Continue with current plan of care       GO                Houston Siren 07/08/2016, 2:37 PM  Crystal Parsons.[employer] [phone]

## 2016-07-08 NOTE — Progress Notes (Signed)
Inpatient Rehabilitation  Per PT request patient was screened for appropriateness for and IP Rehab consult.  MD paged to request order at this time.  Please call with questions.   Carmelia Roller., CCC/SLP Admission Coordinator  [facility]  Cell [phone]

## 2016-07-08 NOTE — Care Management Important Message (Signed)
Important Message  Patient Details  Name: RUAH NOETH MRN: [id number] Date of Birth: Jun 23, 1946   Medicare Important Message Given:  Other (see comment)    Blunt, Renezmae Canlas Abena 07/08/2016, 12:19 PM

## 2016-07-08 NOTE — Progress Notes (Signed)
PROGRESS NOTE  Crystal Parsons  [id number] DOB: 07/08/1946 DOA: 06/30/2016 PCP: Irven Shelling, MD Outpatient Specialists:  Subjective: Seen with daughter and husband at bedside, reported more weakness in the left side  Brief Narrative:  70 year old female with PMH significant for HTN and gout. She was recently diagnosed with gout and treated with colchicine, allopurinol, and a short course of prednisone. Since that time she had been improving. 11/28 overnight she had 2 seizures. She was seizing en route and in ED and was promptly intubated for airway protection. PCCM to see.   Assessment & Plan:   Active Problems:   Seizure ([place])   Status epilepticus ([place])   Acute respiratory failure with hypoxia ([facility])   Brain mass   Seizure -Presented with 2 episodes of seizure, one is witnessed by ED staff. -EEG done on 12/1, no report on Epic likely report is pending. -Patient has right frontal lobe mass likely causing the seizure, currently on Keppra, no recent seizure like activity.  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 -On antiepileptic, steroid, had surgery on 07/07/16. -Consulted CIR to consider admission  Acute respiratory failure with hypoxia -Patient intubated upon admission to the ED for airway protection. -Patient was out of it postictally, after extubation did very well.  DVT prophylaxis:  Code Status: Full Code Family Communication:  Disposition Plan:  Diet: DIET DYS 3 Room service appropriate? Yes; Fluid consistency: Thin  Consultants:   Neuro  Procedures:   Intubation/extubation  Antimicrobials:   None   Objective: Vitals:   07/08/16 0900 07/08/16 1000 07/08/16 1100 07/08/16 1200  BP: (!) 149/57 (!) 141/59 130/60 (!) 144/58  Pulse: 84 81 72 74  Resp: 19 19 14 14   Temp:      TempSrc:        SpO2: 96% 95% 97% 97%  Weight:      Height:        Intake/Output Summary (Last 24 hours) at 07/08/16 1232 Last data filed at 07/08/16 0800  Gross per 24 hour  Intake          3146.25 ml  Output             1755 ml  Net          1391.25 ml   Filed Weights   07/06/16 0500 07/07/16 0436 07/08/16 0500  Weight: 83.3 kg (183 lb 9.6 oz) 86.5 kg (190 lb 11.2 oz) 84 kg (185 lb 3 oz)    Examination: General exam: Appears calm and comfortable  Respiratory system: Clear to auscultation. Respiratory effort normal. Cardiovascular system: S1 & S2 heard, RRR. No JVD, murmurs, rubs, gallops or clicks. No pedal edema. Gastrointestinal system: Abdomen is nondistended, soft and nontender. No organomegaly or masses felt. Normal bowel sounds heard. Central nervous system: Alert and oriented. No focal neurological deficits. Extremities: Symmetric 5 x 5 power. Skin: No rashes, lesions or ulcers Psychiatry: Judgement and insight appear normal. Mood & affect appropriate.   Data Reviewed: I have personally reviewed following labs and imaging studies  CBC:  Recent Labs Lab 07/02/16 0444 07/03/16 0534 07/04/16 0446 07/06/16 0535  WBC 12.6* 14.3* 11.4* 12.1*  NEUTROABS 9.3*  --   --   --   HGB 11.9* 11.7* 11.0* 13.2  HCT 36.4 35.8* 33.2* 38.0  MCV 92.4 92.7 91.5 88.8  PLT 250 239 244 334  Basic Metabolic Panel:  Recent Labs Lab 07/01/16 1812 07/02/16 0444 07/02/16 1613 07/03/16 0534 07/04/16 0446 07/05/16 0256 07/06/16 0535  NA  --  143  --  144 139 140 142  K  --  3.6  --  3.9 3.4* 3.7 3.4*  CL  --  113*  --  114* 109 110 105  CO2  --  22  --  21* 21* 21* 21*  GLUCOSE  --  110*  --  108* 103* 123* 109*  BUN  --  19  --  14 15 15 15   CREATININE  --  1.08*  --  0.91 0.83 0.83 0.84  CALCIUM  --  9.0  --  9.2 9.0 9.1 9.9  MG 2.3 2.2 2.2  --   --  1.8  --   PHOS 5.0* 4.6 3.7  --   --  4.5  --    GFR: Estimated Creatinine Clearance: 63 mL/min (by C-G formula based on SCr of 0.84  mg/dL). Liver Function Tests: No results for input(s): AST, ALT, ALKPHOS, BILITOT, PROT, ALBUMIN in the last 168 hours. No results for input(s): LIPASE, AMYLASE in the last 168 hours. No results for input(s): AMMONIA in the last 168 hours. Coagulation Profile: No results for input(s): INR, PROTIME in the last 168 hours. Cardiac Enzymes: No results for input(s): CKTOTAL, CKMB, CKMBINDEX, TROPONINI in the last 168 hours. BNP (last 3 results) No results for input(s): PROBNP in the last 8760 hours. HbA1C: No results for input(s): HGBA1C in the last 72 hours. CBG:  Recent Labs Lab 07/07/16 1557 07/07/16 2008 07/07/16 2324 07/08/16 0248 07/08/16 0807  GLUCAP 121* 174* 163* 183* 133*   Lipid Profile:  Recent Labs  07/06/16 0535  TRIG 147   Thyroid Function Tests: No results for input(s): TSH, T4TOTAL, FREET4, T3FREE, THYROIDAB in the last 72 hours. Anemia Panel: No results for input(s): VITAMINB12, FOLATE, FERRITIN, TIBC, IRON, RETICCTPCT in the last 72 hours. Urine analysis:    Component Value Date/Time   COLORURINE YELLOW 07/01/2016 1024   APPEARANCEUR CLEAR 07/01/2016 1024   LABSPEC 1.008 07/01/2016 1024   PHURINE 5.5 07/01/2016 1024   GLUCOSEU NEGATIVE 07/01/2016 1024   HGBUR NEGATIVE 07/01/2016 1024   BILIRUBINUR NEGATIVE 07/01/2016 1024   KETONESUR NEGATIVE 07/01/2016 1024   PROTEINUR NEGATIVE 07/01/2016 1024   NITRITE NEGATIVE 07/01/2016 1024   LEUKOCYTESUR NEGATIVE 07/01/2016 1024   Sepsis Labs: @LABRCNTIP (procalcitonin:4,lacticidven:4)  ) Recent Results (from the past 240 hour(s))  MRSA PCR Screening     Status: None   Collection Time: 07/01/16  1:46 AM  Result Value Ref Range Status   MRSA by PCR NEGATIVE NEGATIVE Final    Comment:        The GeneXpert MRSA Assay (FDA approved for NASAL specimens only), is one component of a comprehensive MRSA colonization surveillance program. It is not intended to diagnose MRSA infection nor to guide or monitor  treatment for MRSA infections.   Surgical pcr screen     Status: None   Collection Time: 07/06/16 11:21 PM  Result Value Ref Range Status   MRSA, PCR NEGATIVE NEGATIVE Final   Staphylococcus aureus NEGATIVE NEGATIVE Final    Comment:        The Xpert SA Assay (FDA approved for NASAL specimens in patients over 57 years of age), is one component of a comprehensive surveillance program.  Test performance has been validated by [facility] for patients greater than or equal to 66 year old. It is not  intended to diagnose infection nor to guide or monitor treatment.      Invalid input(s): PROCALCITONIN, LACTICACIDVEN   Radiology Studies: Mr Jeri Cos Wo Contrast  Result Date: 07/08/2016 CLINICAL DATA:  Status post craniotomy. 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 EXAM: MRI HEAD WITHOUT AND WITH CONTRAST TECHNIQUE: Multiplanar, multiecho pulse sequences of the brain and surrounding structures were obtained without and with intravenous contrast. CONTRAST:  43mL MULTIHANCE GADOBENATE DIMEGLUMINE 529 MG/ML IV SOLN COMPARISON:  MRI of the head July 01, 2016 FINDINGS: INTRACRANIAL CONTENTS: Interval RIGHT frontal resection cavity, no residual solid enhancing component within the primary frontal convexity tumor. Intrinsic T1 shortening along the surgical approach with susceptibility artifact extending to RIGHT basal ganglia. Small amount of layering blood products bilateral occipital horns and within the third ventricle. Local mass effect with surrounding vasogenic edema, no convincing evidence of marginal cytotoxic edema. 2 mm RIGHT to LEFT midline shift with partially effaced RIGHT frontal horn of lateral ventricle, no entrapment. The second subcentimeter peripherally enhancing nodule within the RIGHT posterior frontal centrum semiovale remains. Patchy white matter FLAIR T2 hyperintensities exclusive of the aforementioned abnormality compatible with mild chronic small  vessel ischemic disease. Small amount of RIGHT frontal extra-axial blood products. RIGHT dural enhancement consistent with recent surgery. VASCULAR: Normal major intracranial vascular flow voids present at skull base. SKULL AND UPPER CERVICAL SPINE: Status post RIGHT frontal craniotomy. No abnormal sellar expansion. No suspicious calvarial bone marrow signal. Craniocervical junction maintained. RIGHT scalp soft tissue swelling with skin staples. SINUSES/ORBITS: Small LEFT sphenoid sinus air-fluid level. The included ocular globes and orbital contents are non-suspicious. OTHER: None. IMPRESSION: Interval RIGHT craniotomy for resection of dominant RIGHT frontal lobe tumor without convincing evidence of residual local disease. Intraventricular extension of blood products without hydrocephalus. 2 mm RIGHT to LEFT midline shift without ventricular entrapment. Residual subcentimeter RIGHT frontal lobe satellite nodule consistent with tumor. Electronically Signed   By: Elon Alas M.D.   On: 07/08/2016 02:04   Dg Chest Port 1 View  Result Date: 07/07/2016 CLINICAL DATA:  Central line placement EXAM: PORTABLE CHEST 1 VIEW COMPARISON:  July 04, 2016 FINDINGS: The new right central line terminates in the central SVC. No pneumothorax. No other interval change. IMPRESSION: New right central line in good position.  No pneumothorax. Electronically Signed   By: Dorise Bullion III M.D   On: 07/07/2016 16:25        Scheduled Meds: . allopurinol  300 mg Oral Daily  . Chlorhexidine Gluconate Cloth  6 each Topical Once  . cholecalciferol  2,000 Units Oral Daily  . colchicine  0.6 mg Oral Daily  . dexamethasone  6 mg Intravenous Q6H   Followed by  . dexamethasone  4 mg Intravenous Q6H   Followed by  . [START ON 07/09/2016] dexamethasone  4 mg Intravenous Q8H  . famotidine  20 mg Oral BID  . hydrALAZINE  25 mg Oral Q8H  . hydrochlorothiazide  12.5 mg Oral Daily  . levETIRAcetam  1,000 mg Intravenous Q12H    . losartan  50 mg Oral Daily  . mouth rinse  15 mL Mouth Rinse BID  . multivitamin with minerals  1 tablet Oral Daily  . pravastatin  40 mg Oral q1800   Continuous Infusions: . sodium chloride Stopped (07/07/16 2000)  . sodium chloride 75 mL/hr at 07/08/16 0800     LOS: 8 days    Time spent: 35 minutes    Dewaine Morocho A, MD [facility] Pager [phone]  If 7PM-7AM, please contact night-coverage [email or web address] Password [facility] 07/08/2016, 12:32 PM

## 2016-07-08 NOTE — Progress Notes (Signed)
I will follow up with pt's progress Monday to assist with an inpt rehab admission pending bed availability when pt medically ready to d/c. [phone]

## 2016-07-08 NOTE — Progress Notes (Addendum)
Pt had large episode of vomitus following dinner, reports relief afterwards.  Neuro exam unchanged.  Dr. Arnoldo Morale notified, most likely to be attributed to post-op swelling.  1800 Decadron administered early.  Will monitor closely.

## 2016-07-08 NOTE — Progress Notes (Signed)
Pt with poor attention, drowsiness, and left sided neglect throughout the morning.  Dr Saintclair Halsted notified, pt transfer on hold for now.  Will continue to monitor.

## 2016-07-08 NOTE — PMR Pre-admission (Signed)
PMR Admission Coordinator Pre-Admission Assessment  Patient: Crystal Parsons is an 70 y.o., female MRN: [id number] DOB: 10-19-1945 Height: 5\' 3"  (160 cm) Weight: 85 kg (187 lb 6.3 oz)              Insurance Information HMO:     PPO:      PCP:      IPA:      80/20: yes     OTHER: no HMO PRIMARY: Medicare a and b      Policy#: [id number]      Subscriber: pt Benefits:  Phone #: passport one online     Name: 07/08/16 Eff. Date: 03/01/2010     Deduct: $1316      Out of Pocket Max: none      Life Max: none CIR: 100%      SNF: 20 full days Outpatient: 80%     Co-Pay: 20% Home Health: 100%      Co-Pay: none DME: 80%     Co-Pay: 20% Providers: pt choice  SECONDARY: BCBS supplement      Policy#: [id number]      Subscriber: pt  Medicaid Application Date:       Case Manager:  Disability Application Date:       Case Worker:   Emergency Contact Information Contact Information    Name Relation Home Work Mobile   Verrilli,John Spouse [phone]  [phone]     Current Medical History  Patient Admitting Diagnosis: left hemiparesis due to right frontal mass s/p resection  History of Present Illness:   HPI: JEANNE HARSHMAN is a 70 y.o. female with history of HTN, recent gout flare who was admitted on 07/01/16 with multiple episode of seizures and requires intubation in ED for airway protection. 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 She was extubated without difficulty and was noted to have mild LUE weakness, cognitive deficits affecting processing, attention and memory. Surgical intervention recommended by Dr. Kathyrn Sheriff and she underwent stereotactic   right frontal crani for tumor resection on 12/7. Post op noted to have lethargy, nausea, apraxia as well as bradycardia affecting activity.   Past Medical History  Past Medical History:   Diagnosis Date  . Anxiety   . Bradycardia    At times with pulse in the 40s  . Diabetes ([place])   . Diarrhea    With blating, improved with gluten-free diet  . Diastolic dysfunction   . Essential hypertension, benign    Always has HTN when at the doctor's office.  . Gout   . Hyperlipidemia   . Lynch syndrome   . Mild aortic sclerosis ([place])   . Mitral valve problem    Mildly thickened mitral valve  . Mitral valve prolapse    Mild, anterior  . MR (mitral regurgitation)    ECHO 08/10/09 shows mild MR again and normal EF. No significant changes from prior ECHO.  Marland Kitchen Palpitations    Occasional, but are not significant. ECHO 08/20/08 - Normal EF (60%), mildly thickened mitral valve with mild anterior mitral valve prolapse, mild MR, mild aortic sclerosis, grade 1 diastolic dysfunction.  . Proteinuria    Likely secondary to Diabetes    Family History  family history includes Cancer in her father and mother; Colon cancer in her father and mother.  Prior Rehab/Hospitalizations:  Has the patient had major surgery during  100 days prior to admission? No  Current Medications   Current Facility-Administered Medications:  .  0.9 %  sodium chloride infusion, , Intravenous, Continuous, Raylene Miyamoto, MD, Stopped at 07/07/16 2000 .  acetaminophen (TYLENOL) tablet 650 mg, 650 mg, Oral, Q6H PRN, Newman Pies, MD, 650 mg at 07/10/16 1004 .  allopurinol (ZYLOPRIM) tablet 300 mg, 300 mg, Oral, Daily, Consuella Lose, MD, 300 mg at 07/11/16 1030 .  bisacodyl (DULCOLAX) suppository 10 mg, 10 mg, Rectal, Daily PRN, Consuella Lose, MD .  6 CHG cloth bath night before surgery, , , Once **AND** 6 CHG cloth bath AM of surgery, , , Once **AND** [COMPLETED] Chlorhexidine Gluconate Cloth 2 % PADS 6 each, 6 each, Topical, Once, 6 each at 07/07/16 1030 **AND** Chlorhexidine Gluconate Cloth 2 % PADS 6 each, 6 each, Topical, Once, Consuella Lose, MD .  cholecalciferol (VITAMIN D) tablet 2,000 Units,  2,000 Units, Oral, Daily, Rush Farmer, MD, 2,000 Units at 07/11/16 1030 .  colchicine tablet 0.6 mg, 0.6 mg, Oral, Daily, Consuella Lose, MD, 0.6 mg at 07/11/16 1030 .  [COMPLETED] dexamethasone (DECADRON) injection 6 mg, 6 mg, Intravenous, Q6H, 6 mg at 07/08/16 1258 **FOLLOWED BY** [COMPLETED] dexamethasone (DECADRON) injection 4 mg, 4 mg, Intravenous, Q6H, 4 mg at 07/09/16 1204 **FOLLOWED BY** dexamethasone (DECADRON) injection 4 mg, 4 mg, Intravenous, Q8H, Consuella Lose, MD, 4 mg at 07/11/16 0504 .  famotidine (PEPCID) tablet 20 mg, 20 mg, Oral, BID, Rush Farmer, MD, 20 mg at 07/11/16 1030 .  hydrALAZINE (APRESOLINE) tablet 25 mg, 25 mg, Oral, Q8H, Raylene Miyamoto, MD, 25 mg at 07/11/16 0504 .  hydrochlorothiazide (MICROZIDE) capsule 12.5 mg, 12.5 mg, Oral, Daily, Rush Farmer, MD, 12.5 mg at 07/11/16 1030 .  HYDROcodone-acetaminophen (NORCO/VICODIN) 5-325 MG per tablet 1 tablet, 1 tablet, Oral, Q4H PRN, Consuella Lose, MD, 1 tablet at 07/11/16 0504 .  labetalol (NORMODYNE,TRANDATE) injection 10 mg, 10 mg, Intravenous, Q10 min PRN, Corey Harold, NP .  levETIRAcetam (KEPPRA) tablet 1,000 mg, 1,000 mg, Oral, BID, Verlee Monte, MD, 1,000 mg at 07/11/16 1029 .  losartan (COZAAR) tablet 50 mg, 50 mg, Oral, Daily, Rush Farmer, MD, 50 mg at 07/11/16 1030 .  MEDLINE mouth rinse, 15 mL, Mouth Rinse, BID, Javier Glazier, MD, 15 mL at 07/10/16 2103 .  morphine 2 MG/ML injection 1-2 mg, 1-2 mg, Intravenous, Q2H PRN, Consuella Lose, MD, 2 mg at 07/08/16 0511 .  multivitamin with minerals tablet 1 tablet, 1 tablet, Oral, Daily, Rush Farmer, MD, 1 tablet at 07/11/16 1029 .  ondansetron (ZOFRAN) tablet 4 mg, 4 mg, Oral, Q4H PRN **OR** ondansetron (ZOFRAN) injection 4 mg, 4 mg, Intravenous, Q4H PRN, Consuella Lose, MD, 4 mg at 07/08/16 2032 .  phenol (CHLORASEPTIC) mouth spray 1 spray, 1 spray, Mouth/Throat, PRN, Rush Farmer, MD, 1 spray at 07/02/16 1315 .  pravastatin  (PRAVACHOL) tablet 40 mg, 40 mg, Oral, q1800, Rush Farmer, MD, 40 mg at 07/10/16 1718 .  promethazine (PHENERGAN) tablet 12.5-25 mg, 12.5-25 mg, Oral, Q4H PRN, Consuella Lose, MD .  senna-docusate (Senokot-S) tablet 1 tablet, 1 tablet, Oral, QHS PRN, Consuella Lose, MD  Patients Current Diet: DIET DYS 3 Room service appropriate? Yes; Fluid consistency: Thin  Precautions / Restrictions Precautions Precautions: Fall Precaution Comments: L neglect Restrictions Weight Bearing Restrictions: No   Has the patient had 2 or more falls or a fall with injury in the past year?No  Prior Activity Level Community (5-7x/wk): independent and driving  pta; just drove to [facility] and back visiting daughter. Pt loves to cook, knit and needle point. Very active and independent. Spouse states she is very modest with bathing, dressing and using BSC.  Home Assistive Devices / Equipment Home Assistive Devices/Equipment: None Home Equipment: None  Prior Device Use: Indicate devices/aids used by the patient prior to current illness, exacerbation or injury? None of the above  Prior Functional Level Prior Function Level of Independence: Independent Comments: loves to knit and needlepoint, great cook  Self Care: Did the patient need help bathing, dressing, using the toilet or eating?  Independent  Indoor Mobility: Did the patient need assistance with walking from room to room (with or without device)? Independent  Stairs: Did the patient need assistance with internal or external stairs (with or without device)? Independent  Functional Cognition: Did the patient need help planning regular tasks such as shopping or remembering to take medications? Independent  Current Functional Level Cognition  Arousal/Alertness: Awake/alert Overall Cognitive Status: Impaired/Different from baseline Current Attention Level: Sustained Orientation Level: Oriented X4 Following Commands: Follows one step commands  with increased time Safety/Judgement: Decreased awareness of safety, Decreased awareness of deficits General Comments: Internally distracted. Continued cues to take steps in order to walk.  Attention: Divided Sustained Attention: Appears intact Divided Attention: Impaired Divided Attention Impairment: Verbal basic, Functional basic Memory: Appears intact (8 on Cognistat WNL for her age) Awareness: Impaired Awareness Impairment: Anticipatory impairment, Emergent impairment, Intellectual impairment Problem Solving:  (verbal WFL, suspect difficulty w/ functional) Safety/Judgment: Impaired    Extremity Assessment (includes Sensation/Coordination)  Upper Extremity Assessment: LUE deficits/detail RUE Deficits / Details: Pt unable to complete hand to mouth pattern. Able to actively flex shoulder @ 30 degrees. gross grasp/release but unable to make full fist. Not useg hand functionally at this time. Poor attention to L hand/UE RUE:  (general weakness) LUE Coordination: decreased fine motor, decreased gross motor  Lower Extremity Assessment: Defer to PT evaluation LLE Deficits / Details: Family indicate hx of ankle fx and pt c/o feeling like gout in her foot.   LLE Coordination: decreased fine motor    ADLs  Overall ADL's : Needs assistance/impaired Eating/Feeding: Minimal assistance, Sitting Eating/Feeding Details (indicate cue type and reason): cuign to attend to task. Will furtehr assess Grooming: Moderate assistance Grooming Details (indicate cue type and reason): unable to use LUE to assist with grooming Upper Body Bathing: Moderate assistance, Sitting Lower Body Bathing: Maximal assistance, Sit to/from stand Lower Body Bathing Details (indicate cue type and reason): Limited by poor attention, poor initiation and decreased ability to use LUE funcitonally Upper Body Dressing : Maximal assistance Lower Body Dressing: Maximal assistance Lower Body Dressing Details (indicate cue type and  reason): Assist for starting pants over L foot. Toilet Transfer: Stand-pivot, Moderate assistance, Cueing for safety, Cueing for sequencing, BSC Toileting- Clothing Manipulation and Hygiene: Maximal assistance Toileting - Clothing Manipulation Details (indicate cue type and reason): Pt incontinent of urine during session. Husband states this is new Tub/ Banker: Supervision/safety, Walk-in shower, Ambulation Functional mobility during ADLs: Moderate assistance, Cueing for safety, Cueing for sequencing General ADL Comments: Significant decline in mobility. Pt running into items on L    Mobility  Overal bed mobility: Needs Assistance Bed Mobility: Supine to Sit Supine to sit: Max assist, HOB elevated Sit to supine: Modified independent (Device/Increase time) General bed mobility comments: OOB in chair    Transfers  Overall transfer level: Needs assistance Equipment used: Rolling walker (2 wheeled) Transfers: Sit to/from Stand Sit to  Stand: Mod assist, +2 safety/equipment Stand pivot transfers: Mod assist, +2 physical assistance General transfer comment: difficulty with negotiating space with trnasfers    Ambulation / Gait / Stairs / Wheelchair Mobility  Ambulation/Gait Ambulation/Gait assistance: Min guard Ambulation Distance (Feet): 130 Feet Assistive device: None Gait Pattern/deviations: Decreased stride length, Step-through pattern General Gait Details: 2 episodes of mild LOB but able to recover with steadying assist. Decreased awareness of surroundings in hallway noted requiring cues  Gait velocity: decreased    Posture / Balance Dynamic Sitting Balance Sitting balance - Comments: Requires Mod A-Min guard assist at times with posterior lean.  Balance Overall balance assessment: Needs assistance Sitting-balance support: Feet supported, No upper extremity supported Sitting balance-Leahy Scale: Fair Sitting balance - Comments: Requires Mod A-Min guard assist at times with  posterior lean.  Standing balance support: During functional activity Standing balance-Leahy Scale: Poor Standing balance comment: Reliant on BUEs for support in standing.    Special needs/care consideration BiPAP/CPAP  N/a CPM  N/a Continuous Drip IV n/a Dialysis  N/a Life Vest  N/a Oxygen  N/a Special Bed  N/a Trach Size  N/a Wound Vac (area)  N/a Skin surgical incision                             Bowel mgmt: incontinent LBM 12/11 Bladder mgmt: incontinent Diabetic mgmt yes   Previous Home Environment Living Arrangements: Spouse/significant other  Lives With: Spouse Available Help at Discharge: Family, Available 24 hours/day Type of Home: House Home Layout: One level Home Access: Stairs to enter Entrance Stairs-Rails: None Entrance Stairs-Number of Steps: 2 step entry with no rial and alos one step entry with fence post to hold onto [employer] Shower/Tub: Multimedia programmer: Standard Bathroom Accessibility: Yes How Accessible: Accessible via walker [place]: No  Discharge Living Setting Plans for Discharge Living Setting: Patient's home, Lives with (comment) Type of Home at Discharge: House Discharge Home Layout: One level Discharge Home Access: Stairs to enter Entrance Stairs-Rails: None Entrance Stairs-Number of Steps: 2 step entry with no rail and one step entry with fence post to hold onto Discharge Bathroom Shower/Tub: Walk-in shower Discharge Bathroom Toilet: Standard Discharge Bathroom Accessibility: Yes How Accessible: Accessible via walker Does the patient have any problems obtaining your medications?: No  Social/Family/Support Systems Patient Roles: Spouse, Parent Contact Information: Helene Shoe Anticipated Caregiver: spouse Anticipated Ambulance person Information: see above Ability/Limitations of Caregiver: no limitations Caregiver Availability: 24/7 Discharge Plan Discussed with Primary Caregiver: Yes Is Caregiver In  Agreement with Plan?: Yes Does Caregiver/Family have Issues with Lodging/Transportation while Pt is in Rehab?: No  Goals/Additional Needs Patient/Family Goal for Rehab: Mod I to supervision with PT, OT, and SLP Expected length of stay: ELOS 11-17 days Pt/Family Agrees to Admission and willing to participate: Yes Program Orientation Provided & Reviewed with Pt/Caregiver Including Roles  & Responsibilities: Yes  Decrease burden of Care through IP rehab admission: n/a  Possible need for SNF placement upon discharge:not anticipated  Patient Condition: This patient's medical and functional status has changed since the consult dated: 07/08/2016 in which the Rehabilitation Physician determined and documented that the patient's condition is appropriate for intensive rehabilitative care in an inpatient rehabilitation facility. See "History of Present Illness" (above) for medical update. Functional changes are: overall mod assist. Patient's medical and functional status update has been discussed with the Rehabilitation physician and patient remains appropriate for inpatient rehabilitation. Will admit to inpatient rehab today.  Preadmission Screen Completed By:  Cleatrice Burke, 07/11/2016 11:11 AM ______________________________________________________________________   Discussed status with Dr. Posey Pronto on 07/11/2016 at 7 and received telephone approval for admission today.  Admission Coordinator:  Cleatrice Burke, time [id number] Date 07/11/2016

## 2016-07-08 NOTE — Progress Notes (Signed)
Subjective: Patient reports She doing well headache better controlled still feels numbness and weakness in the left side unchanged from preop  Objective: Vital signs in last 24 hours: Temp:  [97.7 F (36.5 C)-99.5 F (37.5 C)] 97.8 F (36.6 C) (12/08 0313) Pulse Rate:  [78-111] 78 (12/08 0700) Resp:  [12-23] 15 (12/08 0700) BP: (118-165)/(51-93) 136/56 (12/08 0700) SpO2:  [92 %-100 %] 94 % (12/08 0700) Arterial Line BP: (128-196)/(43-68) 128/46 (12/08 0700) Weight:  [84 kg (185 lb 3 oz)] 84 kg (185 lb 3 oz) (12/08 0500)  Intake/Output from previous day: 12/07 0701 - 12/08 0700 In: 3071.3 [P.O.:240; I.V.:2521.3; IV Piggyback:310] Out: 1955 [id number]; Blood:200] Intake/Output this shift: No intake/output data recorded.  4 out of 5 left upper extremity 2-3 out of 5 left lower extremity feel that this is her baseline there is some flatness to her affect and feel there is some lack of effort with her compliance.  Lab Results:  Recent Labs  07/06/16 0535  WBC 12.1*  HGB 13.2  HCT 38.0  PLT 334   BMET  Recent Labs  07/06/16 0535  NA 142  K 3.4*  CL 105  CO2 21*  GLUCOSE 109*  BUN 15  CREATININE 0.84  CALCIUM 9.9    Studies/Results: Mr Jeri Cos Wo Contrast  Result Date: 07/08/2016 CLINICAL DATA:  Status post craniotomy. 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 EXAM: MRI HEAD WITHOUT AND WITH CONTRAST TECHNIQUE: Multiplanar, multiecho pulse sequences of the brain and surrounding structures were obtained without and with intravenous contrast. CONTRAST:  2mL MULTIHANCE GADOBENATE DIMEGLUMINE 529 MG/ML IV SOLN COMPARISON:  MRI of the head July 01, 2016 FINDINGS: INTRACRANIAL CONTENTS: Interval RIGHT frontal resection cavity, no residual solid enhancing component within the primary frontal convexity tumor. Intrinsic T1 shortening along the surgical approach with susceptibility artifact extending to RIGHT basal ganglia. Small amount of layering blood  products bilateral occipital horns and within the third ventricle. Local mass effect with surrounding vasogenic edema, no convincing evidence of marginal cytotoxic edema. 2 mm RIGHT to LEFT midline shift with partially effaced RIGHT frontal horn of lateral ventricle, no entrapment. The second subcentimeter peripherally enhancing nodule within the RIGHT posterior frontal centrum semiovale remains. Patchy white matter FLAIR T2 hyperintensities exclusive of the aforementioned abnormality compatible with mild chronic small vessel ischemic disease. Small amount of RIGHT frontal extra-axial blood products. RIGHT dural enhancement consistent with recent surgery. VASCULAR: Normal major intracranial vascular flow voids present at skull base. SKULL AND UPPER CERVICAL SPINE: Status post RIGHT frontal craniotomy. No abnormal sellar expansion. No suspicious calvarial bone marrow signal. Craniocervical junction maintained. RIGHT scalp soft tissue swelling with skin staples. SINUSES/ORBITS: Small LEFT sphenoid sinus air-fluid level. The included ocular globes and orbital contents are non-suspicious. OTHER: None. IMPRESSION: Interval RIGHT craniotomy for resection of dominant RIGHT frontal lobe tumor without convincing evidence of residual local disease. Intraventricular extension of blood products without hydrocephalus. 2 mm RIGHT to LEFT midline shift without ventricular entrapment. Residual subcentimeter RIGHT frontal lobe satellite nodule consistent with tumor. Electronically Signed   By: Elon Alas M.D.   On: 07/08/2016 02:04   Dg Chest Port 1 View  Result Date: 07/07/2016 CLINICAL DATA:  Central line placement EXAM: PORTABLE CHEST 1 VIEW COMPARISON:  July 04, 2016 FINDINGS: The new right central line terminates in the central SVC. No pneumothorax. No other interval change. IMPRESSION: New right central line in good position.  No pneumothorax. Electronically Signed   By: Dorise Bullion III M.D  On: 07/07/2016  16:25    Assessment/Plan: 70 year old female postop day 1 craniotomy for evacuation of right glioblastoma. Postoperative MRI scan looks excellent will mobilize with physical and occupational therapy transfer to the floor continue Decadron on a slow wean.  LOS: 8 days     Katherine Tout P 07/08/2016, 7:49 AM

## 2016-07-08 NOTE — Progress Notes (Addendum)
Physical Therapy Treatment Patient Details Name: Crystal Parsons MRN: [id number] DOB: 22-Jul-1946 Today's Date: 07/08/2016    History of Present Illness pt presents with seizure activity and found to have R Frontal Mass;- concern for glioblastoma. pt with hx of HTN, Gout, and DM.  s/p Stereotactic right frontal craniotomy for resection of tumor.     PT Comments    Patient s/p craniotomy for glioblastoma. Pt presents with lethargy, nausea, impaired initiation, apraxia, problem solving, and impaired attention impacting mobility. Increased time and cues to perform all mobility with difficulty sequencing movements. Pt easily distracted and difficulty sustaining attention to task. Mod-Max A for transfers and mobility at this time. Pt with an episode of bradycardia of HR down to mid 40s when dry heaving. Resolved within a few seconds. RN aware. Pt highly motivated. Continue to recommend CIR to maximize independence and mobility prior to return home. Goals updated appropriately.   Follow Up Recommendations  CIR     Equipment Recommendations  None recommended by PT    Recommendations for Other Services Rehab consult     Precautions / Restrictions Precautions Precautions: Fall Restrictions Weight Bearing Restrictions: No    Mobility  Bed Mobility Overal bed mobility: Needs Assistance Bed Mobility: Supine to Sit     Supine to sit: Max assist;HOB elevated     General bed mobility comments: Difficulty initiating movement despite cues; assist with BLEs, trunk and to scoot bottom to EOB.   Transfers Overall transfer level: Needs assistance Equipment used: Rolling walker (2 wheeled) Transfers: Sit to/from [facility] Sit to Stand: Mod assist;+2 physical assistance Stand pivot transfers: Mod assist;+2 physical assistance       General transfer comment: Assist to power to standing with cues for hand placement/technique. Difficulty sequencing/initiating movements. SPT  bed to chair with Max verbal/tactile cues and assist with RW management to help with initiation of movements.   Ambulation/Gait                 Stairs            Wheelchair Mobility    Modified Rankin (Stroke Patients Only)       Balance Overall balance assessment: Needs assistance Sitting-balance support: Feet supported;No upper extremity supported Sitting balance-Leahy Scale: Poor Sitting balance - Comments: Requires Mod A-Min guard assist at times with posterior lean.    Standing balance support: During functional activity Standing balance-Leahy Scale: Poor Standing balance comment: Reliant on BUEs for support in standing.                    Cognition Arousal/Alertness: Lethargic Behavior During Therapy: Flat affect Overall Cognitive Status: Impaired/Different from baseline Area of Impairment: Attention;Following commands   Current Attention Level: Focused   Following Commands: Follows one step commands with increased time (and multimodal cues) Safety/Judgement: Decreased awareness of deficits;Decreased awareness of safety   Problem Solving: Slow processing;Difficulty sequencing;Requires verbal cues;Decreased initiation;Requires tactile cues General Comments: Easily distracted. Difficulty sustaining attention to task requiring constant cues and stimulus. ABle to remember therapist's name in middle of session to introduce to family members.    Exercises      General Comments General comments (skin integrity, edema, etc.): Daughter and spouse present during session.      Pertinent Vitals/Pain Pain Assessment: Faces Faces Pain Scale: Hurts little more Pain Location: headache Pain Descriptors / Indicators: Headache Pain Intervention(s): Monitored during session;Repositioned    Home Living  Prior Function            PT Goals (current goals can now be found in the care plan section) Progress towards PT goals:  Progressing toward goals (s/p surgery so slowly)    Frequency    Min 4X/week      PT Plan Current plan remains appropriate    Co-evaluation             End of Session Equipment Utilized During Treatment: Gait belt Activity Tolerance: Patient tolerated treatment well Patient left: in chair;with call bell/phone within reach;with family/visitor present;with nursing/sitter in room     Time: 0920-0955 PT Time Calculation (min) (ACUTE ONLY): 35 min  Charges:  $Therapeutic Activity: 8-22 mins                    G Codes:      Eavan Gonterman A Antoino Westhoff 07/08/2016, 12:01 PM Wray Kearns, [place], DPT [phone]

## 2016-07-08 NOTE — Progress Notes (Signed)
Occupational Therapy Re- Evaluation Patient Details Name: Crystal Parsons MRN: [id number] DOB: August 17, 1945 Today's Date: 07/08/2016    History of Present Illness pt presents with seizure activity and found to have R Frontal Mass;- concern for glioblastoma. pt with hx of HTN, Gout, and DM.  s/p Stereotactic right frontal craniotomy for resection of tumor.    Clinical Impression   Pt re-evaluated post op with significant functional change, requiring Max A with ADL and mod A with mobility. Pt with apparent L inattention/neglect in addition to significant deficits with attention and initiation. Pt did ambulate @ 20 steps with +2 HHA with moderate assist and continued vc to sustain attention to task. Pt is an excellent CIR candidate. Will follow acutely. Goals downgraded from initiate eval due to decline in status. Nsg aware of functional changes.     Follow Up Recommendations  CIR;Supervision/Assistance - 24 hour    Equipment Recommendations  Other (comment) (TBA)    Recommendations for Other Services Rehab consult     Precautions / Restrictions Precautions Precautions: Fall Precaution Comments: L neglect Restrictions Weight Bearing Restrictions: No      Mobility Bed Mobility   General bed mobility comments: OOB in chair  Transfers Overall transfer level: Needs assistance Equipment used: Rolling walker (2 wheeled) Transfers: Sit to/from Stand Sit to Stand: Mod assist;+2 safety/equipment Stand pivot transfers: Mod assist;+2 physical assistance       General transfer comment: difficulty with negotiating space with trnasfers    Balance Overall balance assessment: Needs assistance Sitting-balance support: Feet supported;No upper extremity supported Sitting balance-Leahy Scale: Fair Sitting balance - Comments: Requires Mod A-Min guard assist at times with posterior lean.    Standing balance support: During functional activity Standing balance-Leahy Scale: Poor Standing  balance comment: Reliant on BUEs for support in standing.                            ADL Overall ADL's : Needs assistance/impaired Eating/Feeding: Minimal assistance;Sitting Eating/Feeding Details (indicate cue type and reason): cuign to attend to task. Will furtehr assess Grooming: Moderate assistance Grooming Details (indicate cue type and reason): unable to use LUE to assist with grooming Upper Body Bathing: Moderate assistance;Sitting   Lower Body Bathing: Maximal assistance;Sit to/from stand Lower Body Bathing Details (indicate cue type and reason): Limited by poor attention, poor initiation and decreased ability to use LUE funcitonally Upper Body Dressing : Maximal assistance   Lower Body Dressing: Maximal assistance   Toilet Transfer: Stand-pivot;Moderate assistance;Cueing for safety;Cueing for sequencing;BSC   Toileting- Clothing Manipulation and Hygiene: Maximal assistance Toileting - Clothing Manipulation Details (indicate cue type and reason): Pt incontinent of urine during session. Husband states this is new     Functional mobility during ADLs: Moderate assistance;Cueing for safety;Cueing for sequencing General ADL Comments: Significant decline in mobility. Pt running into items on L     Vision Vision Assessment?: Vision impaired- to be further tested in functional context Additional Comments: poor visual attention. will further assess  ? L field deficit. Will further assess.    Perception Perception Perception Tested?: Yes Perception Deficits: Inattention/neglect Inattention/Neglect: Does not attend to left visual field;Does not attend to left side of body;Impaired- to be further tested in functional context Spatial deficits: Did not acknowledge stimulus on L during double simultaneous stimulation. Did not attempt to actively use LUE when putting on glasses. Poor orientation in space when attempting to negotiate around bed   [place] tested?:  Deficits Deficits: Initiation. Requried continued vc to keep walking. Would take 1 step. Stop. Cues. 1 step. Stop. cues Praxis-Other Comments: will further assess    Pertinent Vitals/Pain Pain Assessment: Faces Faces Pain Scale: Hurts a little bit Pain Location: headache Pain Descriptors / Indicators: Headache Pain Intervention(s): Limited activity within patient's tolerance     Hand Dominance Right   Extremity/Trunk Assessment Upper Extremity Assessment Upper Extremity Assessment: LUE deficits/detail RUE Deficits / Details: Pt unable to complete hand to mouth pattern. Able to actively flex shoulder @ 30 degrees. gross grasp/release but unable to make full fist. Not useg hand functionally at this time. Poor attention to L hand/UE RUE:  (general weakness) LUE Coordination: decreased fine motor;decreased gross motor   Lower Extremity Assessment Lower Extremity Assessment: Defer to PT evaluation   Cervical / Trunk Assessment Cervical / Trunk Assessment: Other exceptions (R bias)   Communication Communication Communication: No difficulties   Cognition Arousal/Alertness: Lethargic Behavior During Therapy: Flat affect Overall Cognitive Status: Impaired/Different from baseline Area of Impairment: Attention;Memory;Following commands;Safety/judgement;Awareness;Problem solving Orientation Level: Disoriented to;Time Current Attention Level: Sustained Memory: Decreased recall of precautions;Decreased short-term memory Following Commands: Follows one step commands with increased time Safety/Judgement: Decreased awareness of safety;Decreased awareness of deficits Awareness: Emergent Problem Solving: Slow processing;Decreased initiation;Difficulty sequencing;Requires verbal cues;Requires tactile cues General Comments: Internally distracted. Continued cues to take steps in order to walk.    General Comments       Exercises       Shoulder Instructions      Home Living  Family/patient expects to be discharged to:: Inpatient rehab                         Bathroom Accessibility: Yes How Accessible: Accessible via walker            Prior Functioning/Environment Level of Independence: Independent                 OT Problem List: Decreased strength;Decreased range of motion;Decreased activity tolerance;Impaired balance (sitting and/or standing);Impaired vision/perception;Decreased coordination;Decreased cognition;Decreased safety awareness;Decreased knowledge of use of DME or AE;Decreased knowledge of precautions;Obesity;Impaired UE functional use;Pain   OT Treatment/Interventions: Self-care/ADL training;Therapeutic exercise;Neuromuscular education;DME and/or AE instruction;Therapeutic activities;Cognitive remediation/compensation;Visual/perceptual remediation/compensation;Patient/family education;Balance training    OT Goals(Current goals can be found in the care plan section) Acute Rehab OT Goals Patient Stated Goal: to get better OT Goal Formulation: With patient Time For Goal Achievement: 07/20/16 Potential to Achieve Goals: Good (Goals reassessd ) ADL Goals Pt Will Perform Grooming: with set-up;with supervision;sitting Pt Will Perform Upper Body Bathing: with set-up;with supervision;sitting Pt Will Perform Lower Body Bathing: with set-up;with supervision;sit to/from stand Pt Will Perform Upper Body Dressing: sitting;with min guard assist Pt Will Perform Lower Body Dressing: with min guard assist;sit to/from stand Pt Will Transfer to Toilet: ambulating;bedside commode;with min guard assist Pt Will Perform Toileting - Clothing Manipulation and hygiene: with min guard assist;sit to/from stand Pt Will Perform Tub/Shower Transfer: with min assist;ambulating;Shower transfer;3 in 1 Additional ADL Goal #1: Pt will attend to L environment during ADL task with minimal redirectional cues in nondistracting environment Additional ADL Goal #2: Pt  will demonstrate selective attention during ADL task with minimal redirectional cues in nondistracting environment  OT Frequency: Min 2X/week   Barriers to D/C:            Co-evaluation              End of Session Equipment Utilized During Treatment: Gait belt Nurse Communication:  Mobility status  Activity Tolerance: Patient tolerated treatment well Patient left: in chair;with call bell/phone within reach;with family/visitor present   Time: 1230-1310 OT Time Calculation (min): 40 min Charges:  OT General Charges $OT Visit: 1 Procedure OT Evaluation $OT Re-eval: 1 Procedure OT Treatments $Self Care/Home Management : 23-37 mins G-Codes:    Travoris Bushey,HILLARY Jul 28, 2016, 2:07 PM   [facility], OTR/L  [phone] July 28, 2016

## 2016-07-09 LAB — GLUCOSE, CAPILLARY
GLUCOSE-CAPILLARY: 103 mg/dL — AB (ref 65–99)
Glucose-Capillary: 113 mg/dL — ABNORMAL HIGH (ref 65–99)
Glucose-Capillary: 124 mg/dL — ABNORMAL HIGH (ref 65–99)
Glucose-Capillary: 132 mg/dL — ABNORMAL HIGH (ref 65–99)
Glucose-Capillary: 140 mg/dL — ABNORMAL HIGH (ref 65–99)
Glucose-Capillary: 141 mg/dL — ABNORMAL HIGH (ref 65–99)

## 2016-07-09 LAB — BASIC METABOLIC PANEL
Anion gap: 13 (ref 5–15)
BUN: 14 mg/dL (ref 6–20)
CO2: 19 mmol/L — ABNORMAL LOW (ref 22–32)
Calcium: 9.5 mg/dL (ref 8.9–10.3)
Chloride: 105 mmol/L (ref 101–111)
Creatinine, Ser: 0.69 mg/dL (ref 0.44–1.00)
GFR calc Af Amer: >60 mL/min (ref >60)
GFR calc non Af Amer: >60 mL/min (ref >60)
Glucose, Bld: 130 mg/dL — ABNORMAL HIGH (ref 65–99)
Potassium: 4.1 mmol/L (ref 3.5–5.1)
Sodium: 137 mmol/L (ref 135–145)

## 2016-07-09 MED ORDER — ACETAMINOPHEN 325 MG PO TABS
650.0000 mg | ORAL_TABLET | Freq: Four times a day (QID) | ORAL | Status: DC | PRN
  Administered 2016-07-09 – 2016-07-10 (×2): 650 mg via ORAL
  Filled 2016-07-09 (×2): qty 2

## 2016-07-09 NOTE — Progress Notes (Signed)
PROGRESS NOTE  Crystal Parsons  [id number] DOB: 1946-07-25 DOA: 06/30/2016 PCP: Irven Shelling, MD Outpatient Specialists:  Subjective: She is awake, alert and oriented, reported left side weaker than yesterday.  Brief Narrative:  70 year old female with PMH significant for HTN and gout. She was recently diagnosed with gout and treated with colchicine, allopurinol, and a short course of prednisone. Since that time she had been improving. 11/28 overnight she had 2 seizures. She was seizing en route and in ED and was promptly intubated for airway protection. PCCM to see.   Assessment & Plan:   Active Problems:   Seizure ([place])   Status epilepticus ([place])   Acute respiratory failure with hypoxia ([facility])   Brain mass   Seizure -Presented with 2 episodes of seizure, one is witnessed by ED staff. -EEG done on 12/1, no report on Epic likely report is pending. -Patient has right frontal lobe mass likely causing the seizure, currently on Keppra, no recent seizure like activity.  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 -On antiepileptic, steroid, had surgery on 07/07/16. -If improving consider CIR by Monday  Acute respiratory failure with hypoxia -Patient intubated upon admission to the ED for airway protection. -Patient was out of it postictally, after extubation did very well.  Left-sided hemiparesis -Secondary to right frontal lobe brain mass. -Worsened after surgery likely secondary to inflammatory response/swelling, continue steroids follow clinically.  DVT prophylaxis: SCDs Code Status: Full Code Family Communication:  Disposition Plan:  Diet: DIET DYS 3 Room service appropriate? Yes; Fluid consistency: Thin  Consultants:   Neuro  Procedures:   Intubation/extubation  Antimicrobials:   None    Objective: Vitals:   07/09/16 0800 07/09/16 0900 07/09/16 1000 07/09/16 1100  BP: 134/62 (!) 141/61 (!) 133/55 (!) 146/58  Pulse: 71 73 70 70  Resp: 13 18 17 16   Temp: 99.2 F (37.3 C)     TempSrc: Axillary     SpO2: 100% 100% 100% 100%  Weight:      Height:        Intake/Output Summary (Last 24 hours) at 07/09/16 1139 Last data filed at 07/09/16 1100  Gross per 24 hour  Intake             2020 ml  Output                0 ml  Net             2020 ml   Filed Weights   07/07/16 0436 07/08/16 0500 07/09/16 0500  Weight: 86.5 kg (190 lb 11.2 oz) 84 kg (185 lb 3 oz) 83.6 kg (184 lb 4.9 oz)    Examination: General exam: Appears calm and comfortable  Respiratory system: Clear to auscultation. Respiratory effort normal. Cardiovascular system: S1 & S2 heard, RRR. No JVD, murmurs, rubs, gallops or clicks. No pedal edema. Gastrointestinal system: Abdomen is nondistended, soft and nontender. No organomegaly or masses felt. Normal bowel sounds heard. Central nervous system: Alert and oriented. No focal neurological deficits. Extremities: Symmetric 5 x 5 power. Skin: No rashes, lesions or ulcers Psychiatry: Judgement and insight appear normal. Mood & affect appropriate.   Data Reviewed: I have personally reviewed following labs and imaging studies  CBC:  Recent Labs Lab 07/03/16 0534 07/04/16 0446 07/06/16 0535  WBC 14.3* 11.4* 12.1*  HGB 11.7* 11.0* 13.2  HCT  35.8* 33.2* 38.0  MCV 92.7 91.5 88.8  PLT 239 244 [id number]   Basic Metabolic Panel:  Recent Labs Lab 07/02/16 1613 07/03/16 0534 07/04/16 0446 07/05/16 0256 07/06/16 0535 07/09/16 1005  NA  --  144 139 140 142 137  K  --  3.9 3.4* 3.7 3.4* 4.1  CL  --  114* 109 110 105 105  CO2  --  21* 21* 21* 21* 19*  GLUCOSE  --  108* 103* 123* 109* 130*  BUN  --  14 15 15 15 14   CREATININE  --  0.91 0.83 0.83 0.84 0.69  CALCIUM  --  9.2 9.0 9.1 9.9 9.5  MG 2.2  --   --  1.8  --   --   PHOS 3.7  --   --  4.5  --   --     GFR: Estimated Creatinine Clearance: 66.1 mL/min (by C-G formula based on SCr of 0.69 mg/dL). Liver Function Tests: No results for input(s): AST, ALT, ALKPHOS, BILITOT, PROT, ALBUMIN in the last 168 hours. No results for input(s): LIPASE, AMYLASE in the last 168 hours. No results for input(s): AMMONIA in the last 168 hours. Coagulation Profile: No results for input(s): INR, PROTIME in the last 168 hours. Cardiac Enzymes: No results for input(s): CKTOTAL, CKMB, CKMBINDEX, TROPONINI in the last 168 hours. BNP (last 3 results) No results for input(s): PROBNP in the last 8760 hours. HbA1C: No results for input(s): HGBA1C in the last 72 hours. CBG:  Recent Labs Lab 07/08/16 1641 07/08/16 2012 07/08/16 2325 07/09/16 0443 07/09/16 0741  GLUCAP 141* 132* 144* 140* 132*   Lipid Profile: No results for input(s): CHOL, HDL, LDLCALC, TRIG, CHOLHDL, LDLDIRECT in the last 72 hours. Thyroid Function Tests: No results for input(s): TSH, T4TOTAL, FREET4, T3FREE, THYROIDAB in the last 72 hours. Anemia Panel: No results for input(s): VITAMINB12, FOLATE, FERRITIN, TIBC, IRON, RETICCTPCT in the last 72 hours. Urine analysis:    Component Value Date/Time   COLORURINE YELLOW 07/01/2016 1024   APPEARANCEUR CLEAR 07/01/2016 1024   LABSPEC 1.008 07/01/2016 1024   PHURINE 5.5 07/01/2016 1024   GLUCOSEU NEGATIVE 07/01/2016 1024   HGBUR NEGATIVE 07/01/2016 1024   BILIRUBINUR NEGATIVE 07/01/2016 1024   KETONESUR NEGATIVE 07/01/2016 1024   PROTEINUR NEGATIVE 07/01/2016 1024   NITRITE NEGATIVE 07/01/2016 1024   LEUKOCYTESUR NEGATIVE 07/01/2016 1024   Sepsis Labs: @LABRCNTIP (procalcitonin:4,lacticidven:4)  ) Recent Results (from the past 240 hour(s))  MRSA PCR Screening     Status: None   Collection Time: 07/01/16  1:46 AM  Result Value Ref Range Status   MRSA by PCR NEGATIVE NEGATIVE Final    Comment:        The GeneXpert MRSA Assay (FDA approved for NASAL specimens only), is one  component of a comprehensive MRSA colonization surveillance program. It is not intended to diagnose MRSA infection nor to guide or monitor treatment for MRSA infections.   Surgical pcr screen     Status: None   Collection Time: 07/06/16 11:21 PM  Result Value Ref Range Status   MRSA, PCR NEGATIVE NEGATIVE Final   Staphylococcus aureus NEGATIVE NEGATIVE Final    Comment:        The Xpert SA Assay (FDA approved for NASAL specimens in patients over 73 years of age), is one component of a comprehensive surveillance program.  Test performance has been validated by [facility] for patients greater than or equal to 41 year old. It is not intended to diagnose infection nor  to guide or monitor treatment.      Invalid input(s): PROCALCITONIN, LACTICACIDVEN   Radiology Studies: Mr Jeri Cos Wo Contrast  Result Date: 07/08/2016 CLINICAL DATA:  Status post craniotomy. 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 EXAM: MRI HEAD WITHOUT AND WITH CONTRAST TECHNIQUE: Multiplanar, multiecho pulse sequences of the brain and surrounding structures were obtained without and with intravenous contrast. CONTRAST:  79mL MULTIHANCE GADOBENATE DIMEGLUMINE 529 MG/ML IV SOLN COMPARISON:  MRI of the head July 01, 2016 FINDINGS: INTRACRANIAL CONTENTS: Interval RIGHT frontal resection cavity, no residual solid enhancing component within the primary frontal convexity tumor. Intrinsic T1 shortening along the surgical approach with susceptibility artifact extending to RIGHT basal ganglia. Small amount of layering blood products bilateral occipital horns and within the third ventricle. Local mass effect with surrounding vasogenic edema, no convincing evidence of marginal cytotoxic edema. 2 mm RIGHT to LEFT midline shift with partially effaced RIGHT frontal horn of lateral ventricle, no entrapment. The second subcentimeter peripherally enhancing nodule within the RIGHT posterior frontal centrum  semiovale remains. Patchy white matter FLAIR T2 hyperintensities exclusive of the aforementioned abnormality compatible with mild chronic small vessel ischemic disease. Small amount of RIGHT frontal extra-axial blood products. RIGHT dural enhancement consistent with recent surgery. VASCULAR: Normal major intracranial vascular flow voids present at skull base. SKULL AND UPPER CERVICAL SPINE: Status post RIGHT frontal craniotomy. No abnormal sellar expansion. No suspicious calvarial bone marrow signal. Craniocervical junction maintained. RIGHT scalp soft tissue swelling with skin staples. SINUSES/ORBITS: Small LEFT sphenoid sinus air-fluid level. The included ocular globes and orbital contents are non-suspicious. OTHER: None. IMPRESSION: Interval RIGHT craniotomy for resection of dominant RIGHT frontal lobe tumor without convincing evidence of residual local disease. Intraventricular extension of blood products without hydrocephalus. 2 mm RIGHT to LEFT midline shift without ventricular entrapment. Residual subcentimeter RIGHT frontal lobe satellite nodule consistent with tumor. Electronically Signed   By: Elon Alas M.D.   On: 07/08/2016 02:04   Dg Chest Port 1 View  Result Date: 07/07/2016 CLINICAL DATA:  Central line placement EXAM: PORTABLE CHEST 1 VIEW COMPARISON:  July 04, 2016 FINDINGS: The new right central line terminates in the central SVC. No pneumothorax. No other interval change. IMPRESSION: New right central line in good position.  No pneumothorax. Electronically Signed   By: Dorise Bullion III M.D   On: 07/07/2016 16:25        Scheduled Meds: . allopurinol  300 mg Oral Daily  . Chlorhexidine Gluconate Cloth  6 each Topical Once  . cholecalciferol  2,000 Units Oral Daily  . colchicine  0.6 mg Oral Daily  . dexamethasone  4 mg Intravenous Q6H   Followed by  . dexamethasone  4 mg Intravenous Q8H  . famotidine  20 mg Oral BID  . hydrALAZINE  25 mg Oral Q8H  .  hydrochlorothiazide  12.5 mg Oral Daily  . levETIRAcetam  1,000 mg Intravenous Q12H  . losartan  50 mg Oral Daily  . mouth rinse  15 mL Mouth Rinse BID  . multivitamin with minerals  1 tablet Oral Daily  . pravastatin  40 mg Oral q1800   Continuous Infusions: . sodium chloride Stopped (07/07/16 2000)  . sodium chloride 75 mL/hr at 07/09/16 0857     LOS: 9 days    Time spent: 35 minutes    Larue Lightner A, MD [facility] Pager [phone]  If 7PM-7AM, please contact night-coverage [email or web address] Password [email or web address] 07/09/2016, 11:39 AM

## 2016-07-09 NOTE — Progress Notes (Signed)
Patient ID: Crystal Parsons, female   DOB: 1945-08-20, 70 y.o.   MRN: [id number] Subjective:  The patient is somnolent but arousable. She is in no apparent distress.  Objective: Vital signs in last 24 hours: Temp:  [97.8 F (36.6 C)-99.2 F (37.3 C)] 99.2 F (37.3 C) (12/09 0800) Pulse Rate:  [69-90] 73 (12/09 0900) Resp:  [13-27] 18 (12/09 0900) BP: (94-163)/(53-73) 141/61 (12/09 0900) SpO2:  [95 %-100 %] 100 % (12/09 0900) Weight:  [83.6 kg (184 lb 4.9 oz)] 83.6 kg (184 lb 4.9 oz) (12/09 0500)  Intake/Output from previous day: 12/08 0701 - 12/09 0700 In: 2020 [I.V.:1800; IV Piggyback:220] Out: 100 [Urine:100] Intake/Output this shift: Total I/O In: 150 [I.V.:150] Out: -   Physical exam ON the patient is somnolent but arousable. She follows commands on the right. She has left-sided neglect.  Lab Results: No results for input(s): WBC, HGB, HCT, PLT in the last 72 hours. BMET No results for input(s): NA, K, CL, CO2, GLUCOSE, BUN, CREATININE, CALCIUM in the last 72 hours.  Studies/Results: Mr Jeri Cos [id number] Contrast  Result Date: 07/08/2016 CLINICAL DATA:  Status post craniotomy. 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 EXAM: MRI HEAD WITHOUT AND WITH CONTRAST TECHNIQUE: Multiplanar, multiecho pulse sequences of the brain and surrounding structures were obtained without and with intravenous contrast. CONTRAST:  51mL MULTIHANCE GADOBENATE DIMEGLUMINE 529 MG/ML IV SOLN COMPARISON:  MRI of the head July 01, 2016 FINDINGS: INTRACRANIAL CONTENTS: Interval RIGHT frontal resection cavity, no residual solid enhancing component within the primary frontal convexity tumor. Intrinsic T1 shortening along the surgical approach with susceptibility artifact extending to RIGHT basal ganglia. Small amount of layering blood products bilateral occipital horns and within the third ventricle. Local mass effect with surrounding vasogenic edema, no convincing evidence of marginal  cytotoxic edema. 2 mm RIGHT to LEFT midline shift with partially effaced RIGHT frontal horn of lateral ventricle, no entrapment. The second subcentimeter peripherally enhancing nodule within the RIGHT posterior frontal centrum semiovale remains. Patchy white matter FLAIR T2 hyperintensities exclusive of the aforementioned abnormality compatible with mild chronic small vessel ischemic disease. Small amount of RIGHT frontal extra-axial blood products. RIGHT dural enhancement consistent with recent surgery. VASCULAR: Normal major intracranial vascular flow voids present at skull base. SKULL AND UPPER CERVICAL SPINE: Status post RIGHT frontal craniotomy. No abnormal sellar expansion. No suspicious calvarial bone marrow signal. Craniocervical junction maintained. RIGHT scalp soft tissue swelling with skin staples. SINUSES/ORBITS: Small LEFT sphenoid sinus air-fluid level. The included ocular globes and orbital contents are non-suspicious. OTHER: None. IMPRESSION: Interval RIGHT craniotomy for resection of dominant RIGHT frontal lobe tumor without convincing evidence of residual local disease. Intraventricular extension of blood products without hydrocephalus. 2 mm RIGHT to LEFT midline shift without ventricular entrapment. Residual subcentimeter RIGHT frontal lobe satellite nodule consistent with tumor. Electronically Signed   By: Elon Alas M.D.   On: 07/08/2016 02:04   Dg Chest Port 1 View  Result Date: 07/07/2016 CLINICAL DATA:  Central line placement EXAM: PORTABLE CHEST 1 VIEW COMPARISON:  July 04, 2016 FINDINGS: The new right central line terminates in the central SVC. No pneumothorax. No other interval change. IMPRESSION: New right central line in good position.  No pneumothorax. Electronically Signed   By: Dorise Bullion III M.D   On: 07/07/2016 16:25    Assessment/Plan: Postop day #2: She is clinically stable. I'll check her sodium.  LOS: 9 days     Andrya Roppolo D 07/09/2016, 9:54  AM

## 2016-07-10 LAB — GLUCOSE, CAPILLARY
GLUCOSE-CAPILLARY: 136 mg/dL — AB (ref 65–99)
GLUCOSE-CAPILLARY: 114 mg/dL — AB (ref 65–99)
GLUCOSE-CAPILLARY: 153 mg/dL — AB (ref 65–99)
Glucose-Capillary: 132 mg/dL — ABNORMAL HIGH (ref 65–99)
Glucose-Capillary: 140 mg/dL — ABNORMAL HIGH (ref 65–99)
Glucose-Capillary: 144 mg/dL — ABNORMAL HIGH (ref 65–99)

## 2016-07-10 NOTE — Progress Notes (Signed)
PROGRESS NOTE  Crystal Parsons  [id number] DOB: Feb 08, 1946 DOA: 06/30/2016 PCP: Irven Shelling, MD Outpatient Specialists:  Subjective: More awake than yesterday, left side strength better than yesterday she can make a grip with her hand. Follows commands and answer questions. Transfer to the floor  Brief Narrative:  70 year old female with PMH significant for HTN and gout. She was recently diagnosed with gout and treated with colchicine, allopurinol, and a short course of prednisone. Since that time she had been improving. 11/28 overnight she had 2 seizures. She was seizing en route and in ED and was promptly intubated for airway protection. PCCM to see.   Assessment & Plan:   Active Problems:   Seizure ([place])   Status epilepticus ([place])   Acute respiratory failure with hypoxia ([facility])   Brain mass   Seizure -Presented with 2 episodes of seizure, one is witnessed by ED staff. -EEG done on 12/1, no report on Epic likely report is pending. -Patient has right frontal lobe mass likely causing the seizure, currently on Keppra, no recent seizure like activity.  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 -On antiepileptic, steroid, had surgery on 07/07/16. -Transfer to the floor today, if doing okay in the morning she probably can be transferred to CIR.  Acute respiratory failure with hypoxia -Patient intubated upon admission to the ED for airway protection. -Patient was out of it postictally, after extubation did very well.  Left-sided hemiparesis -Secondary to right frontal lobe brain mass. -Worsened after surgery likely secondary to inflammatory response/swelling, continue steroids follow clinically. -This is improving as probably swelling is getting better  DVT prophylaxis: SCDs Code Status: Full Code Family  Communication:  Disposition Plan:  Diet: DIET DYS 3 Room service appropriate? Yes; Fluid consistency: Thin  Consultants:   Neuro  Procedures:   Intubation/extubation  Antimicrobials:   None   Objective: Vitals:   07/10/16 0700 07/10/16 0800 07/10/16 0900 07/10/16 1000  BP: (!) 145/64 (!) 143/63 (!) 139/54 (!) 137/51  Pulse: 67 65 73 77  Resp: 17 16 16  (!) 24  Temp:  97.8 F (36.6 C)    TempSrc:  Oral    SpO2: 95% 96% 100% 99%  Weight:      Height:        Intake/Output Summary (Last 24 hours) at 07/10/16 1027 Last data filed at 07/10/16 1003  Gross per 24 hour  Intake              600 ml  Output                0 ml  Net              600 ml   Filed Weights   07/08/16 0500 07/09/16 0500 07/10/16 0400  Weight: 84 kg (185 lb 3 oz) 83.6 kg (184 lb 4.9 oz) 83.7 kg (184 lb 8.4 oz)    Examination: General exam: Appears calm and comfortable  Respiratory system: Clear to auscultation. Respiratory effort normal. Cardiovascular system: S1 & S2 heard, RRR. No JVD, murmurs, rubs, gallops or clicks. No pedal edema. Gastrointestinal system: Abdomen is nondistended, soft and nontender. No organomegaly or masses felt. Normal bowel sounds heard. Central nervous system: Alert and oriented. No focal neurological deficits. Extremities: Symmetric 5 x 5 power. Skin: No rashes, lesions or ulcers Psychiatry: Judgement and insight appear normal. Mood &  affect appropriate.   Data Reviewed: I have personally reviewed following labs and imaging studies  CBC:  Recent Labs Lab 07/04/16 0446 07/06/16 0535  WBC 11.4* 12.1*  HGB 11.0* 13.2  HCT 33.2* 38.0  MCV 91.5 88.8  PLT 244 [id number]   Basic Metabolic Panel:  Recent Labs Lab 07/04/16 0446 07/05/16 0256 07/06/16 0535 07/09/16 1005  NA 139 140 142 137  K 3.4* 3.7 3.4* 4.1  CL 109 110 105 105  CO2 21* 21* 21* 19*  GLUCOSE 103* 123* 109* 130*  BUN 15 15 15 14   CREATININE 0.83 0.83 0.84 0.69  CALCIUM 9.0 9.1 9.9 9.5  MG  --   1.8  --   --   PHOS  --  4.5  --   --    GFR: Estimated Creatinine Clearance: 66.1 mL/min (by C-G formula based on SCr of 0.69 mg/dL). Liver Function Tests: No results for input(s): AST, ALT, ALKPHOS, BILITOT, PROT, ALBUMIN in the last 168 hours. No results for input(s): LIPASE, AMYLASE in the last 168 hours. No results for input(s): AMMONIA in the last 168 hours. Coagulation Profile: No results for input(s): INR, PROTIME in the last 168 hours. Cardiac Enzymes: No results for input(s): CKTOTAL, CKMB, CKMBINDEX, TROPONINI in the last 168 hours. BNP (last 3 results) No results for input(s): PROBNP in the last 8760 hours. HbA1C: No results for input(s): HGBA1C in the last 72 hours. CBG:  Recent Labs Lab 07/09/16 1510 07/09/16 1953 07/09/16 2340 07/10/16 0359 07/10/16 0820  GLUCAP 113* 124* 141* 132* 136*   Lipid Profile: No results for input(s): CHOL, HDL, LDLCALC, TRIG, CHOLHDL, LDLDIRECT in the last 72 hours. Thyroid Function Tests: No results for input(s): TSH, T4TOTAL, FREET4, T3FREE, THYROIDAB in the last 72 hours. Anemia Panel: No results for input(s): VITAMINB12, FOLATE, FERRITIN, TIBC, IRON, RETICCTPCT in the last 72 hours. Urine analysis:    Component Value Date/Time   COLORURINE YELLOW 07/01/2016 1024   APPEARANCEUR CLEAR 07/01/2016 1024   LABSPEC 1.008 07/01/2016 1024   PHURINE 5.5 07/01/2016 1024   GLUCOSEU NEGATIVE 07/01/2016 1024   HGBUR NEGATIVE 07/01/2016 1024   BILIRUBINUR NEGATIVE 07/01/2016 1024   KETONESUR NEGATIVE 07/01/2016 1024   PROTEINUR NEGATIVE 07/01/2016 1024   NITRITE NEGATIVE 07/01/2016 1024   LEUKOCYTESUR NEGATIVE 07/01/2016 1024   Sepsis Labs: @LABRCNTIP (procalcitonin:4,lacticidven:4)  ) Recent Results (from the past 240 hour(s))  MRSA PCR Screening     Status: None   Collection Time: 07/01/16  1:46 AM  Result Value Ref Range Status   MRSA by PCR NEGATIVE NEGATIVE Final    Comment:        The GeneXpert MRSA Assay (FDA approved  for NASAL specimens only), is one component of a comprehensive MRSA colonization surveillance program. It is not intended to diagnose MRSA infection nor to guide or monitor treatment for MRSA infections.   Surgical pcr screen     Status: None   Collection Time: 07/06/16 11:21 PM  Result Value Ref Range Status   MRSA, PCR NEGATIVE NEGATIVE Final   Staphylococcus aureus NEGATIVE NEGATIVE Final    Comment:        The Xpert SA Assay (FDA approved for NASAL specimens in patients over 19 years of age), is one component of a comprehensive surveillance program.  Test performance has been validated by [facility] for patients greater than or equal to 20 year old. It is not intended to diagnose infection nor to guide or monitor treatment.      Invalid input(s):  PROCALCITONIN, LACTICACIDVEN   Radiology Studies: No results found.      Scheduled Meds: . allopurinol  300 mg Oral Daily  . Chlorhexidine Gluconate Cloth  6 each Topical Once  . cholecalciferol  2,000 Units Oral Daily  . colchicine  0.6 mg Oral Daily  . dexamethasone  4 mg Intravenous Q8H  . famotidine  20 mg Oral BID  . hydrALAZINE  25 mg Oral Q8H  . hydrochlorothiazide  12.5 mg Oral Daily  . levETIRAcetam  1,000 mg Intravenous Q12H  . losartan  50 mg Oral Daily  . mouth rinse  15 mL Mouth Rinse BID  . multivitamin with minerals  1 tablet Oral Daily  . pravastatin  40 mg Oral q1800   Continuous Infusions: . sodium chloride Stopped (07/07/16 2000)     LOS: 10 days    Time spent: 35 minutes    Roseana Rhine A, MD [facility] Pager [phone]  If 7PM-7AM, please contact night-coverage [email or web address] Password [email or web address] 07/10/2016, 10:27 AM

## 2016-07-10 NOTE — Progress Notes (Signed)
Patient ID: Crystal Parsons, female   DOB: 14-Sep-1945, 70 y.o.   MRN: [id number] Subjective:  The patient is somnolent but easily arousable. She answers simple questions. He has no complaints.  Objective: Vital signs in last 24 hours: Temp:  [97.5 F (36.4 C)-99.9 F (37.7 C)] 97.8 F (36.6 C) (12/10 0800) Pulse Rate:  [63-76] 73 (12/10 0900) Resp:  [13-21] 16 (12/10 0900) BP: (109-151)/(53-66) 139/54 (12/10 0900) SpO2:  [95 %-100 %] 100 % (12/10 0900) Weight:  [83.7 kg (184 lb 8.4 oz)] 83.7 kg (184 lb 8.4 oz) (12/10 0400)  Intake/Output from previous day: 12/09 0701 - 12/10 0700 In: 715 [P.O.:120; I.V.:375; IV Piggyback:220] Out: -  Intake/Output this shift: No intake/output data recorded.  Physical exam the patient is somnolent but arousable. She follows commands on the right. She continues to have some left-sided neglect but does move her left extremities.  Lab Results: No results for input(s): WBC, HGB, HCT, PLT in the last 72 hours. BMET  Recent Labs  07/09/16 1005  NA 137  K 4.1  CL 105  CO2 19*  GLUCOSE 130*  BUN 14  CREATININE 0.69  CALCIUM 9.5    Studies/Results: No results found.  Assessment/Plan: Dose postcraniotomy: The patient is neurologically stable. We will plan to transfer to the floor.  LOS: 10 days     Layni Kreamer D 07/10/2016, 9:22 AM

## 2016-07-11 ENCOUNTER — Encounter (HOSPITAL_COMMUNITY): Payer: Self-pay

## 2016-07-11 ENCOUNTER — Inpatient Hospital Stay (HOSPITAL_COMMUNITY)
Admission: RE | Admit: 2016-07-11 | Discharge: 2016-07-22 | DRG: 092 | Disposition: A | Discharge status: Home / Self Care | Payer: Medicare Other | Source: Intra-hospital | Attending: Physical Medicine & Rehabilitation | Admitting: Physical Medicine & Rehabilitation

## 2016-07-11 ENCOUNTER — Encounter (HOSPITAL_COMMUNITY): Payer: Self-pay | Admitting: Physical Medicine and Rehabilitation

## 2016-07-11 DIAGNOSIS — T380X5A Adverse effect of glucocorticoids and synthetic analogues, initial encounter: Secondary | ICD-10-CM

## 2016-07-11 DIAGNOSIS — Z888 Allergy status to other drugs, medicaments and biological substances status: Secondary | ICD-10-CM | POA: Diagnosis not present

## 2016-07-11 DIAGNOSIS — Z85841 Personal history of malignant neoplasm of brain: Secondary | ICD-10-CM

## 2016-07-11 DIAGNOSIS — R2689 Other abnormalities of gait and mobility: Secondary | ICD-10-CM | POA: Diagnosis present

## 2016-07-11 DIAGNOSIS — R1311 Dysphagia, oral phase: Secondary | ICD-10-CM | POA: Diagnosis present

## 2016-07-11 DIAGNOSIS — I340 Nonrheumatic mitral (valve) insufficiency: Secondary | ICD-10-CM | POA: Diagnosis present

## 2016-07-11 DIAGNOSIS — E1165 Type 2 diabetes mellitus with hyperglycemia: Secondary | ICD-10-CM | POA: Diagnosis present

## 2016-07-11 DIAGNOSIS — Z91018 Allergy to other foods: Secondary | ICD-10-CM | POA: Diagnosis not present

## 2016-07-11 DIAGNOSIS — I700 Atherosclerosis of aorta: Secondary | ICD-10-CM | POA: Diagnosis present

## 2016-07-11 DIAGNOSIS — N390 Urinary tract infection, site not specified: Secondary | ICD-10-CM | POA: Diagnosis present

## 2016-07-11 DIAGNOSIS — C711 Malignant neoplasm of frontal lobe: Secondary | ICD-10-CM | POA: Diagnosis not present

## 2016-07-11 DIAGNOSIS — R739 Hyperglycemia, unspecified: Secondary | ICD-10-CM | POA: Diagnosis not present

## 2016-07-11 DIAGNOSIS — D72829 Elevated white blood cell count, unspecified: Secondary | ICD-10-CM

## 2016-07-11 DIAGNOSIS — I10 Essential (primary) hypertension: Secondary | ICD-10-CM | POA: Diagnosis not present

## 2016-07-11 DIAGNOSIS — R414 Neurologic neglect syndrome: Secondary | ICD-10-CM | POA: Diagnosis present

## 2016-07-11 DIAGNOSIS — D496 Neoplasm of unspecified behavior of brain: Secondary | ICD-10-CM

## 2016-07-11 DIAGNOSIS — C719 Malignant neoplasm of brain, unspecified: Secondary | ICD-10-CM | POA: Diagnosis present

## 2016-07-11 DIAGNOSIS — F419 Anxiety disorder, unspecified: Secondary | ICD-10-CM | POA: Diagnosis present

## 2016-07-11 DIAGNOSIS — D72828 Other elevated white blood cell count: Secondary | ICD-10-CM

## 2016-07-11 DIAGNOSIS — R29818 Other symptoms and signs involving the nervous system: Secondary | ICD-10-CM | POA: Diagnosis not present

## 2016-07-11 DIAGNOSIS — R4189 Other symptoms and signs involving cognitive functions and awareness: Secondary | ICD-10-CM | POA: Diagnosis present

## 2016-07-11 DIAGNOSIS — M109 Gout, unspecified: Secondary | ICD-10-CM | POA: Diagnosis present

## 2016-07-11 DIAGNOSIS — R531 Weakness: Secondary | ICD-10-CM

## 2016-07-11 DIAGNOSIS — Z510 Encounter for antineoplastic radiation therapy: Secondary | ICD-10-CM | POA: Diagnosis not present

## 2016-07-11 DIAGNOSIS — I341 Nonrheumatic mitral (valve) prolapse: Secondary | ICD-10-CM | POA: Diagnosis present

## 2016-07-11 DIAGNOSIS — R482 Apraxia: Secondary | ICD-10-CM

## 2016-07-11 DIAGNOSIS — B9620 Unspecified Escherichia coli [E. coli] as the cause of diseases classified elsewhere: Secondary | ICD-10-CM | POA: Diagnosis present

## 2016-07-11 DIAGNOSIS — Z8739 Personal history of other diseases of the musculoskeletal system and connective tissue: Secondary | ICD-10-CM

## 2016-07-11 DIAGNOSIS — Z8481 Family history of carrier of genetic disease: Secondary | ICD-10-CM | POA: Diagnosis not present

## 2016-07-11 DIAGNOSIS — R300 Dysuria: Secondary | ICD-10-CM

## 2016-07-11 DIAGNOSIS — D72821 Monocytosis (symptomatic): Secondary | ICD-10-CM | POA: Diagnosis not present

## 2016-07-11 HISTORY — DX: Malignant neoplasm of brain, unspecified: C71.9

## 2016-07-11 LAB — GLUCOSE, CAPILLARY
GLUCOSE-CAPILLARY: 115 mg/dL — AB (ref 65–99)
GLUCOSE-CAPILLARY: 139 mg/dL — AB (ref 65–99)
Glucose-Capillary: 130 mg/dL — ABNORMAL HIGH (ref 65–99)
Glucose-Capillary: 133 mg/dL — ABNORMAL HIGH (ref 65–99)
Glucose-Capillary: 247 mg/dL — ABNORMAL HIGH (ref 65–99)

## 2016-07-11 MED ORDER — LEVETIRACETAM 500 MG PO TABS
1000.0000 mg | ORAL_TABLET | Freq: Two times a day (BID) | ORAL | Status: DC
  Filled 2016-07-11: qty 2

## 2016-07-11 MED ORDER — SENNOSIDES-DOCUSATE SODIUM 8.6-50 MG PO TABS
1.0000 | ORAL_TABLET | Freq: Every evening | ORAL | Status: DC | PRN
  Administered 2016-07-14: 1 via ORAL
  Filled 2016-07-11: qty 1

## 2016-07-11 MED ORDER — TRAZODONE HCL 50 MG PO TABS: 25.0000 mg | ORAL_TABLET | Freq: Every evening | ORAL | Status: DC | PRN

## 2016-07-11 MED ORDER — ACETAMINOPHEN 325 MG PO TABS
650.0000 mg | ORAL_TABLET | Freq: Four times a day (QID) | ORAL | Status: DC | PRN
  Administered 2016-07-19: 650 mg via ORAL
  Filled 2016-07-11 (×2): qty 2

## 2016-07-11 MED ORDER — ALUM & MAG HYDROXIDE-SIMETH 200-200-20 MG/5ML PO SUSP: 30.0000 mL | ORAL | Status: DC | PRN

## 2016-07-11 MED ORDER — HYDROCHLOROTHIAZIDE 12.5 MG PO CAPS
12.5000 mg | ORAL_CAPSULE | Freq: Every day | ORAL | Status: DC
  Administered 2016-07-12 – 2016-07-22 (×11): 12.5 mg via ORAL
  Filled 2016-07-11 (×11): qty 1

## 2016-07-11 MED ORDER — HYDRALAZINE HCL 25 MG PO TABS
25.0000 mg | ORAL_TABLET | Freq: Three times a day (TID) | ORAL | Status: DC
  Administered 2016-07-11 – 2016-07-22 (×32): 25 mg via ORAL
  Filled 2016-07-11 (×32): qty 1

## 2016-07-11 MED ORDER — ALLOPURINOL 100 MG PO TABS
300.0000 mg | ORAL_TABLET | Freq: Every day | ORAL | Status: DC
  Administered 2016-07-12 – 2016-07-22 (×11): 300 mg via ORAL
  Filled 2016-07-11 (×11): qty 3

## 2016-07-11 MED ORDER — FAMOTIDINE 20 MG PO TABS
20.0000 mg | ORAL_TABLET | Freq: Two times a day (BID) | ORAL | Status: DC
  Administered 2016-07-11 – 2016-07-22 (×22): 20 mg via ORAL
  Filled 2016-07-11 (×13): qty 1
  Filled 2016-07-11: qty 2
  Filled 2016-07-11 (×8): qty 1

## 2016-07-11 MED ORDER — HYDROCODONE-ACETAMINOPHEN 5-325 MG PO TABS
1.0000 | ORAL_TABLET | ORAL | Status: DC | PRN
  Administered 2016-07-12: 1 via ORAL
  Filled 2016-07-11: qty 1

## 2016-07-11 MED ORDER — ADULT MULTIVITAMIN W/MINERALS CH
1.0000 | ORAL_TABLET | Freq: Every day | ORAL | Status: DC
  Administered 2016-07-12 – 2016-07-22 (×11): 1 via ORAL
  Filled 2016-07-11 (×11): qty 1

## 2016-07-11 MED ORDER — BISACODYL 10 MG RE SUPP: 10.0000 mg | Freq: Every day | RECTAL | Status: DC | PRN

## 2016-07-11 MED ORDER — LEVETIRACETAM 1000 MG PO TABS: 1000.0000 mg | ORAL_TABLET | Freq: Two times a day (BID) | ORAL | Status: DC

## 2016-07-11 MED ORDER — COLCHICINE 0.6 MG PO TABS
0.6000 mg | ORAL_TABLET | Freq: Every day | ORAL | Status: DC
  Administered 2016-07-12 – 2016-07-15 (×4): 0.6 mg via ORAL
  Filled 2016-07-11 (×6): qty 1

## 2016-07-11 MED ORDER — DEXAMETHASONE SODIUM PHOSPHATE 4 MG/ML IJ SOLN: 4.0000 mg | Freq: Three times a day (TID) | INTRAMUSCULAR | Status: DC

## 2016-07-11 MED ORDER — VITAMIN D 1000 UNITS PO TABS
2000.0000 [IU] | ORAL_TABLET | Freq: Every day | ORAL | Status: DC
  Administered 2016-07-12 – 2016-07-22 (×11): 2000 [IU] via ORAL
  Filled 2016-07-11 (×11): qty 2

## 2016-07-11 MED ORDER — ONDANSETRON HCL 4 MG/2ML IJ SOLN: 4.0000 mg | INTRAMUSCULAR | Status: DC | PRN

## 2016-07-11 MED ORDER — INSULIN ASPART 100 UNIT/ML ~~LOC~~ SOLN
0.0000 [IU] | Freq: Every day | SUBCUTANEOUS | Status: DC
  Administered 2016-07-11: 2 [IU] via SUBCUTANEOUS

## 2016-07-11 MED ORDER — LOSARTAN POTASSIUM 50 MG PO TABS
50.0000 mg | ORAL_TABLET | Freq: Every day | ORAL | Status: DC
  Administered 2016-07-12 – 2016-07-22 (×11): 50 mg via ORAL
  Filled 2016-07-11 (×11): qty 1

## 2016-07-11 MED ORDER — LEVETIRACETAM 500 MG PO TABS
1000.0000 mg | ORAL_TABLET | Freq: Two times a day (BID) | ORAL | Status: DC
  Administered 2016-07-11: 1000 mg via ORAL
  Filled 2016-07-11: qty 2

## 2016-07-11 MED ORDER — ONDANSETRON HCL 4 MG PO TABS: 4.0000 mg | ORAL_TABLET | ORAL | Status: DC | PRN

## 2016-07-11 MED ORDER — GUAIFENESIN-DM 100-10 MG/5ML PO SYRP
5.0000 mL | ORAL_SOLUTION | Freq: Four times a day (QID) | ORAL | Status: DC | PRN
Start: 2016-07-11 — End: 2016-07-22

## 2016-07-11 MED ORDER — LEVETIRACETAM 100 MG/ML PO SOLN
1000.0000 mg | Freq: Two times a day (BID) | ORAL | Status: DC
  Administered 2016-07-11 – 2016-07-14 (×6): 1000 mg via ORAL
  Filled 2016-07-11 (×7): qty 10

## 2016-07-11 MED ORDER — DIPHENHYDRAMINE HCL 12.5 MG/5ML PO ELIX: 12.5000 mg | ORAL_SOLUTION | Freq: Four times a day (QID) | ORAL | Status: DC | PRN

## 2016-07-11 MED ORDER — FLEET ENEMA 7-19 GM/118ML RE ENEM
1.0000 | ENEMA | Freq: Once | RECTAL | Status: AC | PRN
  Administered 2016-07-16: 1 via RECTAL
  Filled 2016-07-11: qty 1

## 2016-07-11 MED ORDER — DEXAMETHASONE SODIUM PHOSPHATE 4 MG/ML IJ SOLN
4.0000 mg | Freq: Three times a day (TID) | INTRAMUSCULAR | Status: DC
  Administered 2016-07-11 – 2016-07-14 (×8): 4 mg via INTRAVENOUS
  Filled 2016-07-11 (×8): qty 1

## 2016-07-11 MED ORDER — INSULIN ASPART 100 UNIT/ML ~~LOC~~ SOLN
0.0000 [IU] | Freq: Three times a day (TID) | SUBCUTANEOUS | Status: DC
  Administered 2016-07-11 – 2016-07-21 (×12): 1 [IU] via SUBCUTANEOUS

## 2016-07-11 NOTE — Progress Notes (Signed)
Meredith Staggers, MD Physician Signed Physical Medicine and Rehabilitation  Consult Note Date of Service: 07/08/2016 12:47 PM  Related encounter: ED to Hosp-Admission (Current) from 06/30/2016 in [facility] All Collapse All   [] Hide copied text [] Hover for attribution information      Physical Medicine and Rehabilitation Consult   Reason for Consult: Right frontal brain mass Referring Physician: Dr. Hartford Poli.    HPI: Crystal Parsons is a 70 y.o. female with history of HTN, recent gout flare who was admitted on 07/01/16 with multiple episode of seizures and requires intubation in ED for airway protection. 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 She was extubated without difficulty and was noted to have mild LUE weakness, cognitive deficits affecting processing, attention and memory. Surgical intervention recommended by Dr. Kathyrn Sheriff and she underwent stereotactic   right frontal crani for tumor resection on 12/7. Post op noted to have lethargy, nausea, apraxia as well as bradycardia affecting activity. CIR recommended for follow up therapy.    Review of Systems  HENT: Positive for hearing loss. Negative for ear pain and tinnitus.   Eyes: Negative for blurred vision and double vision.  Respiratory: Negative for cough, shortness of breath and wheezing.   Cardiovascular: Positive for palpitations.  Gastrointestinal: Positive for constipation and heartburn.  Genitourinary:       Gets up twice at nights.   Musculoskeletal: Positive for back pain. Negative for joint pain and myalgias.  Psychiatric/Behavioral: Positive for depression. The patient is nervous/anxious and has insomnia.           Past Medical History:  Diagnosis Date  . Anxiety   . Bradycardia    At times with pulse in  the 40s  . Diabetes ([place])   . Diarrhea    With blating, improved with gluten-free diet  . Diastolic dysfunction   . Essential hypertension, benign    Always has HTN when at the doctor's office.  . Hyperlipidemia   . 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 Mitral valve problem    Mildly thickened mitral valve  . Mitral valve prolapse    Mild, anterior  . MR (mitral regurgitation)    ECHO 08/10/09 shows mild MR again and normal EF. No significant changes from prior ECHO.  Marland Kitchen Palpitations    Occasional, but are not significant. ECHO 08/20/08 - Normal EF (60%), mildly thickened mitral valve with mild anterior mitral valve prolapse, mild MR, mild aortic sclerosis, grade 1 diastolic dysfunction.  . Proteinuria    Likely secondary to Diabetes         Past Surgical History:  Procedure Laterality Date  . ABDOMINAL HYSTERECTOMY    . APPLICATION OF CRANIAL NAVIGATION Right 07/07/2016   Procedure: APPLICATION OF CRANIAL NAVIGATION;  Surgeon: Consuella Lose, MD;  Location: [facility];  Service: Neurosurgery;  Laterality: Right;  . BREAST LUMPECTOMY Left   . CRANIOTOMY Right 07/07/2016   Procedure: CRANIOTOMY TUMOR EXCISION w/BrainLab;  Surgeon: Consuella Lose, MD;  Location: [facility];  Service: Neurosurgery;  Laterality: Right;  . HEMORROIDECTOMY    . TUBAL LIGATION           Family History  Problem Relation Age of Onset  . Cancer Mother   . Cancer Father     Social History:  Married. Retired --worked in Scientist, research (medical).  Retired 19 years ago. She reports that she has never smoked. She has never used smokeless tobacco. She reports that she drinks alcohol--1-2 glasses of wine and/or bourbon/water at nights.  She reports that she does not use drugs.         Allergies  Allergen Reactions  . Accupril [Quinapril Hcl]     *ACE INHIBITORS*   . Ace Inhibitors   . Novocain [Procaine]   . Reglan [Metoclopramide] Other (See Comments)    Muscle aches  . Tylenol  [Acetaminophen] Other (See Comments)    No allergy to this drug per patient  . Zoloft [Sertraline Hcl] Other (See Comments)    Anxiety  . Naproxen Itching and Rash          Medications Prior to Admission  Medication Sig Dispense Refill  . allopurinol (ZYLOPRIM) 300 MG tablet Take 300 mg by mouth daily.    . Cholecalciferol (VITAMIN D) 2000 UNITS tablet Take 2,000 Units by mouth daily.    . colchicine 0.6 MG tablet Take 1 tablet (0.6 mg total) by mouth daily. 30 tablet 0  . losartan-hydrochlorothiazide (HYZAAR) 50-12.5 MG tablet Take 1 tablet by mouth daily.     . Multiple Vitamin (MULTIVITAMIN) tablet Take 1 tablet by mouth daily.    . pravastatin (PRAVACHOL) 40 MG tablet Take 40 mg by mouth daily.     . cephALEXin (KEFLEX) 500 MG capsule Take 1 capsule (500 mg total) by mouth 3 (three) times daily. (Patient not taking: Reported on 06/30/2016) 21 capsule 0  . HYDROcodone-acetaminophen (NORCO) 10-325 MG tablet Take 1 tablet by mouth every 8 (eight) hours as needed. (Patient not taking: Reported on 06/30/2016) 20 tablet 0    Home: Home Living Family/patient expects to be discharged to:: Private residence Living Arrangements: Spouse/significant other Available Help at Discharge: Family, Available 24 hours/day Type of Home: House Home Layout: One level Bathroom Shower/Tub: Multimedia programmer: Standard Home Equipment: None  Lives With: Spouse  Functional History: Prior Function Level of Independence: Independent Functional Status:  Mobility: Bed Mobility Overal bed mobility: Needs Assistance Bed Mobility: Supine to Sit Supine to sit: Max assist, HOB elevated Sit to supine: Modified independent (Device/Increase time) General bed mobility comments: Difficulty initiating movement despite cues; assist with BLEs, trunk and to scoot bottom to EOB.  Transfers Overall transfer level: Needs assistance Equipment used: Rolling walker (2 wheeled) Transfers:  Sit to/from Stand, [employer] Transfers Sit to Stand: Mod assist, +2 physical assistance Stand pivot transfers: Mod assist, +2 physical assistance General transfer comment: Assist to power to standing with cues for hand placement/technique. Difficulty sequencing/initiating movements. SPT bed to chair with Max verbal/tactile cues and assist with RW management to help with initiation of movements.  Ambulation/Gait Ambulation/Gait assistance: Min guard Ambulation Distance (Feet): 130 Feet Assistive device: None Gait Pattern/deviations: Decreased stride length, Step-through pattern General Gait Details: 2 episodes of mild LOB but able to recover with steadying assist. Decreased awareness of surroundings in hallway noted requiring cues  Gait velocity: decreased    ADL: ADL Overall ADL's : Needs assistance/impaired Grooming: Supervision/safety, Brushing hair, Applying deodorant, Oral care, Standing Upper Body Bathing: Set up, Supervision/ safety, Standing Lower Body Bathing: Supervison/ safety, Sit to/from stand, Set up Upper Body Dressing : Supervision/safety, Sitting, Set up Lower Body Dressing: Minimal assistance, Sit to/from stand Lower Body Dressing Details (indicate cue type and reason): Assist for starting pants over L foot. Toilet Transfer: Supervision/safety, Ambulation, Regular Toilet Toileting- Water quality scientist and Hygiene: Supervision/safety, Sit to/from stand  Tub/ Shower Transfer: Supervision/safety, Walk-in shower, Ambulation Functional mobility during ADLs: Supervision/safety General ADL Comments: Pt able to complete full ADL with supervision for safety. Pt able to gather items and perform higher level balance during functional activities without unstediness. Pt with mild SOB following shower and dressing.  Cognition: Cognition Overall Cognitive Status: Impaired/Different from baseline Arousal/Alertness: Awake/alert Orientation Level: Oriented X4 Attention:  Divided Sustained Attention: Appears intact Divided Attention: Impaired Divided Attention Impairment: Verbal basic, Functional basic Memory: Appears intact (8 on Cognistat WNL for her age) Awareness: Impaired Awareness Impairment: Anticipatory impairment, Emergent impairment, Intellectual impairment Problem Solving:  (verbal WFL, suspect difficulty w/ functional) Safety/Judgment: Impaired Cognition Arousal/Alertness: Lethargic Behavior During Therapy: Flat affect Overall Cognitive Status: Impaired/Different from baseline Area of Impairment: Attention, Following commands Orientation Level: Person, Place, Situation, Time Current Attention Level: Focused Memory: Decreased short-term memory Following Commands: Follows one step commands with increased time (and multimodal cues) Safety/Judgement: Decreased awareness of deficits, Decreased awareness of safety Awareness: Emergent Problem Solving: Slow processing, Difficulty sequencing, Requires verbal cues, Decreased initiation, Requires tactile cues General Comments: Easily distracted. Difficulty sustaining attention to task requiring constant cues and stimulus. ABle to remember therapist's name in middle of session to introduce to family members.  Blood pressure (!) 144/58, pulse 74, temperature 98.3 F (36.8 C), temperature source Oral, resp. rate 14, height 5\' 3"  (1.6 m), weight 84 kg (185 lb 3 oz), SpO2 97 %. Physical Exam  Constitutional: No distress.  HENT:  Scalp with bandage  Eyes: Right eye exhibits no discharge. Left eye exhibits no discharge.  Neck: No tracheal deviation present. No thyromegaly present.  Cardiovascular: Normal rate.   Respiratory: Effort normal.  GI: She exhibits no distension.  Musculoskeletal: She exhibits edema.  Neurological:  RUE 4/5. LUE 2/5 prox to distal. RLE: 4/5 LLE: 2/5 Prox to 2+ distally. No gross sensory loss. Oriented to name, place, date  Psychiatric:  Flat, drowsy    Lab Results Last  24 Hours       Results for orders placed or performed during the hospital encounter of 06/30/16 (from the past 24 hour(s))  Glucose, capillary     Status: Abnormal   Collection Time: 07/07/16  3:57 PM  Result Value Ref Range   Glucose-Capillary 121 (H) 65 - 99 mg/dL  Glucose, capillary     Status: Abnormal   Collection Time: 07/07/16  8:08 PM  Result Value Ref Range   Glucose-Capillary 174 (H) 65 - 99 mg/dL  Glucose, capillary     Status: Abnormal   Collection Time: 07/07/16 11:24 PM  Result Value Ref Range   Glucose-Capillary 163 (H) 65 - 99 mg/dL  Glucose, capillary     Status: Abnormal   Collection Time: 07/08/16  2:48 AM  Result Value Ref Range   Glucose-Capillary 183 (H) 65 - 99 mg/dL  Glucose, capillary     Status: Abnormal   Collection Time: 07/08/16  8:07 AM  Result Value Ref Range   Glucose-Capillary 133 (H) 65 - 99 mg/dL      Imaging Results (Last 48 hours)  Mr Jeri Cos Wo Contrast  Result Date: 07/08/2016 CLINICAL DATA:  Status post craniotomy. 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 EXAM: MRI HEAD WITHOUT AND WITH CONTRAST TECHNIQUE: Multiplanar, multiecho pulse sequences of the brain and surrounding structures were obtained without and with intravenous contrast. CONTRAST:  50mL MULTIHANCE GADOBENATE DIMEGLUMINE 529 MG/ML IV SOLN COMPARISON:  MRI of the head July 01, 2016 FINDINGS: INTRACRANIAL CONTENTS: Interval RIGHT frontal resection cavity,  no residual solid enhancing component within the primary frontal convexity tumor. Intrinsic T1 shortening along the surgical approach with susceptibility artifact extending to RIGHT basal ganglia. Small amount of layering blood products bilateral occipital horns and within the third ventricle. Local mass effect with surrounding vasogenic edema, no convincing evidence of marginal cytotoxic edema. 2 mm RIGHT to LEFT midline shift with partially effaced RIGHT frontal horn of lateral ventricle, no  entrapment. The second subcentimeter peripherally enhancing nodule within the RIGHT posterior frontal centrum semiovale remains. Patchy white matter FLAIR T2 hyperintensities exclusive of the aforementioned abnormality compatible with mild chronic small vessel ischemic disease. Small amount of RIGHT frontal extra-axial blood products. RIGHT dural enhancement consistent with recent surgery. VASCULAR: Normal major intracranial vascular flow voids present at skull base. SKULL AND UPPER CERVICAL SPINE: Status post RIGHT frontal craniotomy. No abnormal sellar expansion. No suspicious calvarial bone marrow signal. Craniocervical junction maintained. RIGHT scalp soft tissue swelling with skin staples. SINUSES/ORBITS: Small LEFT sphenoid sinus air-fluid level. The included ocular globes and orbital contents are non-suspicious. OTHER: None. IMPRESSION: Interval RIGHT craniotomy for resection of dominant RIGHT frontal lobe tumor without convincing evidence of residual local disease. Intraventricular extension of blood products without hydrocephalus. 2 mm RIGHT to LEFT midline shift without ventricular entrapment. Residual subcentimeter RIGHT frontal lobe satellite nodule consistent with tumor. Electronically Signed   By: Elon Alas M.D.   On: 07/08/2016 02:04   Dg Chest Port 1 View  Result Date: 07/07/2016 CLINICAL DATA:  Central line placement EXAM: PORTABLE CHEST 1 VIEW COMPARISON:  July 04, 2016 FINDINGS: The new right central line terminates in the central SVC. No pneumothorax. No other interval change. IMPRESSION: New right central line in good position.  No pneumothorax. 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 Does the need for close, 24 hr/day medical supervision in concert with the patient's rehab needs make it unreasonable for this patient to be served in a less  intensive setting? Yes 2. Co-Morbidities requiring supervision/potential complications: seizure 3. Due to bladder management, bowel management, safety, skin/wound care, disease management, medication administration, pain management and patient education, does the patient require 24 hr/day rehab nursing? Yes 4. Does the patient require coordinated care of a physician, rehab nurse, PT (1-2 hrs/day, 5 days/week), OT (1-2 hrs/day, 5 days/week) and SLP (1-2 hrs/day, 5 days/week) to address physical and functional deficits in the context of the above medical diagnosis(es)? Yes Addressing deficits in the following areas: balance, endurance, locomotion, strength, transferring, bowel/bladder control, bathing, dressing, feeding, grooming, toileting, cognition, speech and psychosocial support 5. Can the patient actively participate in an intensive therapy program of at least 3 hrs of therapy per day at least 5 days per week? Yes 6. The potential for patient to make measurable gains while on inpatient rehab is excellent 7. Anticipated functional outcomes upon discharge from inpatient rehab are modified independent and supervision  with PT, modified independent and supervision with OT, modified independent with SLP. 8. Estimated rehab length of stay to reach the above functional goals is: 11-17 days 9. Does the patient have adequate social supports and living environment to accommodate these discharge functional goals? Yes 10. Anticipated D/C setting: Home 11. Anticipated post D/C treatments: HH therapy and Outpatient therapy 12. Overall Rehab/Functional Prognosis: good  RECOMMENDATIONS: This patient's condition is appropriate for continued rehabilitative care in the following setting: CIR Patient has  agreed to participate in recommended program. N/A Note that insurance prior authorization may be required for reimbursement for recommended care.  Comment: Rehab Admissions Coordinator to follow  up.  Thanks,  Meredith Staggers, MD, Tilford Pillar, PA-C 07/08/2016    Revision History                        Routing History

## 2016-07-11 NOTE — Progress Notes (Signed)
I met with Crystal Parsons and her spouse at bedside to discuss an inpt rehab admission. They both are in agreement. I contacted Dr. Hartford Poli and Dr. Ralene Ok and they are both in agreement also. RN CM and SW are aware. I will make the arrangements to admit Crystal Parsons today. [phone]

## 2016-07-11 NOTE — Progress Notes (Signed)
Patient arrived to unit, no pain. Oriented to unit. Verbalized understanding need to call for assistance.

## 2016-07-11 NOTE — H&P (Signed)
Physical Medicine and Rehabilitation Admission H&P    Chief Complaint  Patient presents with  .     HPI: Crystal Parsons is a 70 y.o. female with history of HTN, Lynch syndrome, palpitations, recent gout flare who was admitted on 07/01/16 with multiple episodes of seizures and requires intubation in ED for airway protection. 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 She was extubated without difficulty and was noted to have mild LUE weakness, cognitive deficits affecting processing, attention and memory. Surgical intervention recommended by Dr. Kathyrn Sheriff and she underwent stereotactic right frontal crani for tumor resection on 12/7.   Patient and husband deny prior DM or cardiac history. On decadron for management of edema and on keppra for seizures. She has had issues with bradycardia as well as hypoxia post op. Somnolence resolving and patient with resultant apraxia, left sided weakness, cognitive deficits, decreased attention as well as deficits in mobility and self care tasks.  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  CIR recommended for follow up therapy.    Review of Systems  HENT: Positive for hearing loss (on left). Negative for tinnitus.   Respiratory: Positive for cough. Negative for shortness of breath.   Cardiovascular: Negative for chest pain and palpitations.  Gastrointestinal: Positive for constipation (chronic constipation with hems). Negative for abdominal pain, heartburn and vomiting.  Genitourinary: Positive for dysuria. Negative for urgency.  Musculoskeletal: Negative for back pain, joint pain, myalgias and neck pain.  Neurological: Positive for speech change, focal weakness and headaches (facial pressure). Negative for dizziness.  All other systems reviewed and are negative.  Past Medical History:  Diagnosis Date  .  Anxiety   . Bradycardia    At times with pulse in the 40s  . Diarrhea    With blating, improved with gluten-free diet  . Diastolic dysfunction   . Essential hypertension, benign    Always has HTN when at the doctor's office.  . Gout   . Hyperlipidemia   . Lynch syndrome   . 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 Mitral valve problem    Mildly thickened mitral valve  . Mitral valve prolapse    Mild, anterior  . MR (mitral regurgitation)    ECHO 08/10/09 shows mild MR again and normal EF. No significant changes from prior ECHO.  Marland Kitchen Palpitations    Occasional, but are not significant. ECHO 08/20/08 - Normal EF (60%), mildly thickened mitral valve with mild anterior mitral valve prolapse, mild MR, mild aortic sclerosis, grade 1 diastolic dysfunction.  . Proteinuria    Likely secondary to Diabetes    Past Surgical History:  Procedure Laterality Date  . ABDOMINAL HYSTERECTOMY    . APPLICATION OF CRANIAL NAVIGATION Right 07/07/2016   Procedure: APPLICATION OF CRANIAL NAVIGATION;  Surgeon: Consuella Lose, MD;  Location: [facility];  Service: Neurosurgery;  Laterality: Right;  . BREAST LUMPECTOMY Left   . CRANIOTOMY Right 07/07/2016   Procedure: CRANIOTOMY TUMOR EXCISION w/BrainLab;  Surgeon: Consuella Lose, MD;  Location: [facility];  Service: Neurosurgery;  Laterality: Right;  . HEMORROIDECTOMY  2009  . TUBAL LIGATION      Family History  Problem Relation Age of Onset  . Cancer Mother   . Colon cancer Mother   . Cancer Father   . Colon cancer Father   . Diabetes Father     Social History:  Married. Worked for a few years at [employer]. Retired 20 years ago and involved with church/knitting. She reports that she has never smoked. She has never used smokeless tobacco. She reports that she drinks 2 glasses of wine and one mixed drinks daily. She reports that she does not use drugs.    Allergies  Allergen Reactions  . Accupril [Quinapril Hcl]     *ACE INHIBITORS*   . Ace Inhibitors       Rash/itching  . Gluten Meal   . Novocain [Procaine]   . Reglan [Metoclopramide] Other (See Comments)    Muscle aches  . Zoloft [Sertraline Hcl] Other (See Comments)    Anxiety  . Naproxen Itching and Rash    Medications Prior to Admission  Medication Sig Dispense Refill  . allopurinol (ZYLOPRIM) 300 MG tablet Take 300 mg by mouth daily.    . Cholecalciferol (VITAMIN D) 2000 UNITS tablet Take 2,000 Units by mouth daily.    . colchicine 0.6 MG tablet Take 1 tablet (0.6 mg total) by mouth daily. 30 tablet 0  . losartan-hydrochlorothiazide (HYZAAR) 50-12.5 MG tablet Take 1 tablet by mouth daily.     . Multiple Vitamin (MULTIVITAMIN) tablet Take 1 tablet by mouth daily.    . pravastatin (PRAVACHOL) 40 MG tablet Take 40 mg by mouth daily.     . cephALEXin (KEFLEX) 500 MG capsule Take 1 capsule (500 mg total) by mouth 3 (three) times daily. (Patient not taking: Reported on 06/30/2016) 21 capsule 0  . HYDROcodone-acetaminophen (NORCO) 10-325 MG tablet Take 1 tablet by mouth every 8 (eight) hours as needed. (Patient not taking: Reported on 06/30/2016) 20 tablet 0    Home: Home Living Family/patient expects to be discharged to:: Inpatient rehab Living Arrangements: Spouse/significant other Available Help at Discharge: Family, Available 24 hours/day Type of Home: House Home Access: Stairs to enter [employer] of Steps: 2 step entry with no rial and alos one step entry with fence post to hold onto Entrance Stairs-Rails: None Home Layout: One level Bathroom Shower/Tub: Multimedia programmer: Programmer, systems: Yes Home Equipment: None  Lives With: Spouse   Functional History: Prior Function Level of Independence: Independent Comments: loves to knit and needlepoint, great cook  Functional Status:  Mobility: Bed Mobility Overal bed mobility: Needs Assistance Bed Mobility: Supine to Sit Supine to sit: Max assist, HOB elevated Sit to supine:  Modified independent (Device/Increase time) General bed mobility comments: OOB in chair Transfers Overall transfer level: Needs assistance Equipment used: Rolling walker (2 wheeled) Transfers: Sit to/from Stand Sit to Stand: Mod assist, +2 safety/equipment Stand pivot transfers: Mod assist, +2 physical assistance General transfer comment: difficulty with negotiating space with trnasfers Ambulation/Gait Ambulation/Gait assistance: Min guard Ambulation Distance (Feet): 130 Feet Assistive device: None Gait Pattern/deviations: Decreased stride length, Step-through pattern General Gait Details: 2 episodes of mild LOB but able to recover with steadying assist. Decreased awareness of surroundings in hallway noted requiring cues  Gait velocity: decreased    ADL: ADL Overall ADL's : Needs assistance/impaired Eating/Feeding: Minimal assistance, Sitting Eating/Feeding Details (indicate cue type and reason): cuign to attend to task. Will furtehr assess Grooming: Moderate assistance Grooming Details (indicate cue type and reason): unable to use LUE to assist with grooming Upper Body Bathing: Moderate assistance, Sitting Lower Body Bathing: Maximal assistance, Sit to/from stand Lower Body Bathing Details (indicate cue type and reason): Limited by poor attention, poor initiation and decreased ability to use LUE funcitonally Upper Body Dressing : Maximal assistance Lower Body  Dressing: Maximal assistance Lower Body Dressing Details (indicate cue type and reason): Assist for starting pants over L foot. Toilet Transfer: Stand-pivot, Moderate assistance, Cueing for safety, Cueing for sequencing, BSC Toileting- Clothing Manipulation and Hygiene: Maximal assistance Toileting - Clothing Manipulation Details (indicate cue type and reason): Pt incontinent of urine during session. Husband states this is new Tub/ Banker: Supervision/safety, Walk-in shower, Ambulation Functional mobility during  ADLs: Moderate assistance, Cueing for safety, Cueing for sequencing General ADL Comments: Significant decline in mobility. Pt running into items on L  Cognition: Cognition Overall Cognitive Status: Impaired/Different from baseline Arousal/Alertness: Awake/alert Orientation Level: Oriented X4 Attention: Divided Sustained Attention: Appears intact Divided Attention: Impaired Divided Attention Impairment: Verbal basic, Functional basic Memory: Appears intact (8 on Cognistat WNL for her age) Awareness: Impaired Awareness Impairment: Anticipatory impairment, Emergent impairment, Intellectual impairment Problem Solving:  (verbal WFL, suspect difficulty w/ functional) Safety/Judgment: Impaired Cognition Arousal/Alertness: Lethargic Behavior During Therapy: Flat affect Overall Cognitive Status: Impaired/Different from baseline Area of Impairment: Attention, Memory, Following commands, Safety/judgement, Awareness, Problem solving Orientation Level: Disoriented to, Time Current Attention Level: Sustained Memory: Decreased recall of precautions, Decreased short-term memory Following Commands: Follows one step commands with increased time Safety/Judgement: Decreased awareness of safety, Decreased awareness of deficits Awareness: Emergent Problem Solving: Slow processing, Decreased initiation, Difficulty sequencing, Requires verbal cues, Requires tactile cues General Comments: Internally distracted. Continued cues to take steps in order to walk.   Physical Exam: Blood pressure (!) 141/53, pulse 78, temperature 98.1 F (36.7 C), temperature source Oral, resp. rate 19, height 5\' 3"  (1.6 m), weight 85 kg (187 lb 6.3 oz), SpO2 99 %. Physical Exam  Nursing note and vitals reviewed. Constitutional: She appears well-developed and well-nourished.  HENT:  Head: Normocephalic.  Mouth/Throat: Oropharynx is clear and moist.  Eyes: Conjunctivae and EOM are normal. Pupils are equal, round, and reactive  to light.  Neck: Normal range of motion. Neck supple.  Cardiovascular: Normal rate and regular rhythm.   Respiratory: Effort normal and breath sounds normal. No stridor. No respiratory distress. She has no wheezes.  GI: Soft. Bowel sounds are normal. She exhibits no distension.  Musculoskeletal: She exhibits edema. She exhibits no tenderness.  Neurological: She is alert.  Question left gaze preference.  Left facial weakness  Sensation diminished to light touch LUE/LLE Motor: 4-/5 throughout (left slightly weaker than right).  Apraxia LUE.  Dysphonia noted.  Able to follow basic commands without difficulty.  A&Ox2. Slow to initiate and thought that it was 2011 despite cues.   Skin: Skin is warm and dry.  Right curvilinear scalp incision clean,dry, intact with staples in place.   Psychiatric: Her speech is delayed. She is slowed. She expresses inappropriate judgment.    Results for orders placed or performed during the hospital encounter of 06/30/16 (from the past 48 hour(s))  Glucose, capillary     Status: Abnormal   Collection Time: 07/09/16  3:10 PM  Result Value Ref Range   Glucose-Capillary 113 (H) 65 - 99 mg/dL  Glucose, capillary     Status: Abnormal   Collection Time: 07/09/16  7:53 PM  Result Value Ref Range   Glucose-Capillary 124 (H) 65 - 99 mg/dL  Glucose, capillary     Status: Abnormal   Collection Time: 07/09/16 11:40 PM  Result Value Ref Range   Glucose-Capillary 141 (H) 65 - 99 mg/dL  Glucose, capillary     Status: Abnormal   Collection Time: 07/10/16  3:59 AM  Result Value Ref Range   Glucose-Capillary  132 (H) 65 - 99 mg/dL  Glucose, capillary     Status: Abnormal   Collection Time: 07/10/16  8:20 AM  Result Value Ref Range   Glucose-Capillary 136 (H) 65 - 99 mg/dL  Glucose, capillary     Status: Abnormal   Collection Time: 07/10/16 11:35 AM  Result Value Ref Range   Glucose-Capillary 114 (H) 65 - 99 mg/dL  Glucose, capillary     Status: Abnormal    Collection Time: 07/10/16  4:03 PM  Result Value Ref Range   Glucose-Capillary 153 (H) 65 - 99 mg/dL  Glucose, capillary     Status: Abnormal   Collection Time: 07/10/16  9:18 PM  Result Value Ref Range   Glucose-Capillary 140 (H) 65 - 99 mg/dL  Glucose, capillary     Status: Abnormal   Collection Time: 07/11/16 12:01 AM  Result Value Ref Range   Glucose-Capillary 144 (H) 65 - 99 mg/dL   Comment 1 Notify RN    Comment 2 Document in Chart   Glucose, capillary     Status: Abnormal   Collection Time: 07/11/16  3:47 AM  Result Value Ref Range   Glucose-Capillary 130 (H) 65 - 99 mg/dL   Comment 1 Notify RN    Comment 2 Document in Chart   Glucose, capillary     Status: Abnormal   Collection Time: 07/11/16  8:26 AM  Result Value Ref Range   Glucose-Capillary 115 (H) 65 - 99 mg/dL  Glucose, capillary     Status: Abnormal   Collection Time: 07/11/16 11:43 AM  Result Value Ref Range   Glucose-Capillary 139 (H) 65 - 99 mg/dL   No results found.     Medical Problem List and Plan: 1.  Apraxia, left sided weakness, cognitive deficits, decreased attention as well as deficits in mobility and self care tasks secondary to left brain mass (likely GBM).  2.  DVT Prophylaxis/Anticoagulation: Mechanical:  Antiembolism stockings, knee (TED hose) Bilateral lower extremities Sequential compression devices, below knee Bilateral lower extremities 3. Pain Management: tylenol effective.  4. Mood: LCSW to follow for evaluation and support.  5. Neuropsych: This patient is not fully capable of making decisions on her own behalf. 6. Skin/Wound Care: Monitor incision for healing. Maintain adequate nutritional and hydration status.  7. Fluids/Electrolytes/Nutrition: Monitor I/O. Check lytes in am.  8. New onset seizures: Managed by keppra.  9. Leucocytosis: Likely reactive due to steroids. Monitor for signs of infection.  10. HTN: Monitor tid. Continue Apresoline, Cozaar and HCTZ.  11. Steroid induced  hyperglycemia: Hgb A1c- 5.5. Will monitor BS ac/hs and use SSI for elevated BS.  12. H/o gout: - Controlled on allopurinol and colchicine. 13. Dysuria: will check UA/UCS.    Post Admission Physician Evaluation: 1. Preadmission assessment reviewed and changes made below. 2. Functional deficits secondary to left brain mass (likely GBM). . 3. Patient is admitted to receive collaborative, interdisciplinary care between the physiatrist, rehab nursing staff, and therapy team. 4. Patient's level of medical complexity and substantial therapy needs in context of that medical necessity cannot be provided at a lesser intensity of care such as a SNF. 5. Patient has experienced substantial functional loss from his/her baseline which was documented above under the "Functional History" and "Functional Status" headings.  Judging by the patient's diagnosis, physical exam, and functional history, the patient has potential for functional progress which will result in measurable gains while on inpatient rehab.  These gains will be of substantial and practical use upon discharge  in facilitating mobility and self-care at the household level. 6. Physiatrist will provide 24 hour management of medical needs as well as oversight of the therapy plan/treatment and provide guidance as appropriate regarding the interaction of the two. 7. The Preadmission Screening has been reviewed and patient status is unchanged unless otherwise stated above. 8. 24 hour rehab nursing will assist with bladder management, bowel management, safety, skin/wound care, disease management, pain management and patient education  and help integrate therapy concepts, techniques,education, etc. 9. PT will assess and treat for/with: Lower extremity strength, range of motion, stamina, balance, functional mobility, safety, adaptive techniques and equipment, woundcare, coping skills, pain control, education.   Goals are: Supervision. 10. OT will assess and  treat for/with: ADL's, functional mobility, safety, upper extremity strength, adaptive techniques and equipment, wound mgt, ego support, and community reintegration.   Goals are: Supervision/Min A. Therapy may not proceed with showering this patient. 11. SLP will assess and treat for/with: language, cognition.  Goals are: Mod I/Ind. 12. Case Management and Social Worker will assess and treat for psychological issues and discharge planning. 40. Team conference will be held weekly to assess progress toward goals and to determine barriers to discharge. 14. Patient will receive at least 3 hours of therapy per day at least 5 days per week. 15. ELOS: 14-18 days.       16. Prognosis:  fair   Jakeob Tullis Lorie Phenix, MD, Mellody Drown 07/11/2016

## 2016-07-11 NOTE — Discharge Summary (Signed)
Physician Discharge Summary  Crystal Parsons [id number] DOB: 1946/07/28 DOA: 06/30/2016  PCP: Irven Shelling, MD  Admit date: 06/30/2016 Discharge date: 07/11/2016  Admitted From: Home Disposition: CIR  Recommendations for Outpatient Follow-up:  1. Follow up with PCP in 1-2 weeks 2. Please obtain BMP/CBC in one week 3. Please consult with neurosurgery for dexamethasone course and fever.  Home Health: NA Equipment/Devices:NA  Discharge Condition: Stable CODE STATUS: Full Code Diet recommendation: DIET DYS 3 Room service appropriate? Yes; Fluid consistency: Thin  Brief/Interim Summary: 70 year old female with PMH significant for HTN and gout. She was recently diagnosed with gout and treated with colchicine, allopurinol, and a short course of prednisone. Since that time she had been improving. 11/28 overnight she had 2 seizures. She was seizing en route and in ED and was promptly intubated for airway protection. Linear MRI should brain mass.  Discharge Diagnoses:  Active Problems:   Seizure ([place])   Status epilepticus ([place])   Acute respiratory failure with hypoxia ([facility])   Brain mass   Seizure -Presented with 2 episodes of seizure, one is witnessed by ED staff. -EEG done on 12/1, no report on Epic likely report is pending. -Patient has right frontal lobe mass likely causing the seizure. -Discharged on Keppra, no recent seizure like activity.  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 -On antiepileptic, steroid, had surgery on 07/07/16. -Continue dexamethasone for now, follow with neurosurgery for taper.  Acute respiratory failure with hypoxia -Patient intubated upon admission to the ED for airway protection. -Patient was out of it postictally, after extubation did very well.  Left-sided hemiparesis -Secondary  to right frontal lobe brain mass. -Worsened after surgery likely secondary to inflammatory response/swelling, continue steroids follow clinically. -This is improved, patient CIR today.   Discharge Instructions  Discharge Instructions    Increase activity slowly    Complete by:  As directed    Increase activity slowly    Complete by:  As directed        Medication List    STOP taking these medications   cephALEXin 500 MG capsule Commonly known as:  KEFLEX     TAKE these medications   allopurinol 300 MG tablet Commonly known as:  ZYLOPRIM Take 300 mg by mouth daily.   colchicine 0.6 MG tablet Take 1 tablet (0.6 mg total) by mouth daily.   dexamethasone 4 MG/ML injection Commonly known as:  DECADRON Inject 1 mL (4 mg total) into the vein every 8 (eight) hours.   HYDROcodone-acetaminophen 10-325 MG tablet Commonly known as:  NORCO Take 1 tablet by mouth every 8 (eight) hours as needed.   levETIRAcetam 1000 MG tablet Commonly known as:  KEPPRA Take 1 tablet (1,000 mg total) by mouth 2 (two) times daily.   losartan-hydrochlorothiazide 50-12.5 MG tablet Commonly known as:  HYZAAR Take 1 tablet by mouth daily.   multivitamin tablet Take 1 tablet by mouth daily.   pravastatin 40 MG tablet Commonly known as:  PRAVACHOL Take 40 mg by mouth daily.   Vitamin D 2000 units tablet Take 2,000 Units by mouth daily.       Allergies  Allergen Reactions  . Accupril [Quinapril Hcl]     *ACE INHIBITORS*   . Ace Inhibitors     Rash/itching  . Novocain [Procaine]   . Reglan [Metoclopramide] Other (See Comments)    Muscle  aches  . Zoloft [Sertraline Hcl] Other (See Comments)    Anxiety  . Naproxen Itching and Rash    Consultations: Treatment Team:  Consuella Lose, MD Kary Kos, MD   Procedures (Echo, Carotid, EGD, Colonoscopy, ERCP)   Radiological studies: Ct Head Wo Contrast  Result Date: 07/01/2016 CLINICAL DATA:  New onset seizures EXAM: CT HEAD  WITHOUT CONTRAST TECHNIQUE: Contiguous axial images were obtained from the base of the skull through the vertex without intravenous contrast. COMPARISON:  Brain MRI 08/09/2011 FINDINGS: Brain: There is a focal region of hypoattenuation within the right frontal lobe measuring 4.8 x 2.3 cm with multiple internal septations. There is minimal adjacent edema. There is a focal region of hyperattenuation more posteriorly within the right frontal white matter. There is minimal midline shift, measuring approximately 2 mm. No frank herniation. Basal cisterns are clearly patent. No hydrocephalus. No extra-axial collection. Vascular: Atherosclerotic calcification of the vertebral arteries at the skullbase. Skull: Normal. Negative for fracture or focal lesion. Sinuses/Orbits: No acute finding. Other: None. IMPRESSION: 1. Mass lesion within the right frontal lobe measuring up to 4.8 cm. 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 However, a cerebral abscess may have a similar appearance. MRI with and without contrast is recommended for further characterization. 2. No significant mass effect or midline shift.  No hydrocephalus. Electronically Signed   By: Ulyses Jarred M.D.   On: 07/01/2016 00:23   Ct Chest Wo Contrast  Result Date: 07/01/2016 CLINICAL DATA:  Evaluate right perihilar airspace opacity. EXAM: CT CHEST WITHOUT CONTRAST TECHNIQUE: Multidetector CT imaging of the chest was performed following the standard protocol without IV contrast. COMPARISON:  Chest x-ray 07/01/2016 FINDINGS: Chest wall: No breast masses, supraclavicular or axillary lymphadenopathy. Small scattered lymph nodes are noted. The thyroid gland is grossly normal. Endotracheal tube and NG tubes are in place. Cardiovascular: The heart is borderline enlarged. No pericardial effusion. The aorta is normal in caliber. Mild tortuosity. No significant atherosclerotic calcifications. Coronary artery calcifications  are noted. Mediastinum/Nodes: Small scattered mediastinal and hilar lymph nodes but no mass or overt adenopathy. The esophagus is grossly normal. Lungs/Pleura: Right upper lobe atelectasis adjacent to the major fissure and also adjacent to the suprahilar region. No masses identified. Very small pleural effusions and streaky areas of subsegmental atelectasis in both lower lung zones. No definite pneumonia. No pneumothorax. Upper Abdomen: No significant upper abdominal findings. Musculoskeletal: No significant bony findings. IMPRESSION: 1. Right parahilar density appears to be atelectasis or possible infiltrate but no mass is identified. There is also streaky bibasilar subsegmental atelectasis and very small pleural effusions. 2. No mediastinal or hilar mass or adenopathy. Scattered lymph nodes are noted. Electronically Signed   By: Marijo Sanes M.D.   On: 07/01/2016 16:29   Mr Jeri Cos [id number] Contrast  Result Date: 07/08/2016 CLINICAL DATA:  Status post craniotomy. 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 EXAM: MRI HEAD WITHOUT AND WITH CONTRAST TECHNIQUE: Multiplanar, multiecho pulse sequences of the brain and surrounding structures were obtained without and with intravenous contrast. CONTRAST:  79mL MULTIHANCE GADOBENATE DIMEGLUMINE 529 MG/ML IV SOLN COMPARISON:  MRI of the head July 01, 2016 FINDINGS: INTRACRANIAL CONTENTS: Interval RIGHT frontal resection cavity, no residual solid enhancing component within the primary frontal convexity tumor. Intrinsic T1 shortening along the surgical approach with susceptibility artifact extending to RIGHT basal ganglia. Small amount of layering blood products bilateral occipital horns and within the third ventricle. Local mass effect with surrounding vasogenic  edema, no convincing evidence of marginal cytotoxic edema. 2 mm RIGHT to LEFT midline shift with partially effaced RIGHT frontal horn of lateral ventricle, no entrapment. The second  subcentimeter peripherally enhancing nodule within the RIGHT posterior frontal centrum semiovale remains. Patchy white matter FLAIR T2 hyperintensities exclusive of the aforementioned abnormality compatible with mild chronic small vessel ischemic disease. Small amount of RIGHT frontal extra-axial blood products. RIGHT dural enhancement consistent with recent surgery. VASCULAR: Normal major intracranial vascular flow voids present at skull base. SKULL AND UPPER CERVICAL SPINE: Status post RIGHT frontal craniotomy. No abnormal sellar expansion. No suspicious calvarial bone marrow signal. Craniocervical junction maintained. RIGHT scalp soft tissue swelling with skin staples. SINUSES/ORBITS: Small LEFT sphenoid sinus air-fluid level. The included ocular globes and orbital contents are non-suspicious. OTHER: None. IMPRESSION: Interval RIGHT craniotomy for resection of dominant RIGHT frontal lobe tumor without convincing evidence of residual local disease. Intraventricular extension of blood products without hydrocephalus. 2 mm RIGHT to LEFT midline shift without ventricular entrapment. Residual subcentimeter RIGHT frontal lobe satellite nodule consistent with tumor. Electronically Signed   By: Elon Alas M.D.   On: 07/08/2016 02:04   Mr Brain W And Wo Contrast  Result Date: 07/01/2016 CLINICAL DATA:  Seizure, follow-up RIGHT frontal lobe mass. History of hypertension and diabetes. EXAM: MRI HEAD WITHOUT AND WITH CONTRAST TECHNIQUE: Multiplanar, multiecho pulse sequences of the brain and surrounding structures were obtained without and with intravenous contrast. CONTRAST:  18mL MULTIHANCE GADOBENATE DIMEGLUMINE 529 MG/ML IV SOLN COMPARISON:  CT HEAD June 30, 2016 and MRI head August 09, 2011 FINDINGS: Multiple sequences are moderately or severely motion degraded. INTRACRANIAL CONTENTS: RIGHT frontal lobe cystic and solid 3 x 4.3 x 4.5 cm (AP by transverse by CC) mass with thickened trabecula, enhancing  multinodular solid components and sub cm focus of associated reduced diffusion consistent with hypercellularity. No definite susceptibility artifact to suggest hemorrhage. Mild T2 bright surrounding signal extends through the genu of the corpus callosum. 2.2 cm posterior to the mass is a second enhancing cystic and solid sub cm mass with increased surrounding enhancement suggesting parasitic vessels. Surrounding FLAIR T2 hyperintense signal. RIGHT frontal mass effect resulting in 2 mm RIGHT to LEFT subfalcine herniation. No atrophy for age. Patchy supratentorial and pontine white matter T2 hyperintensities exclusive of the aforementioned abnormality consistent with chronic small vessel ischemic disease. No abnormal extra-axial fluid collections, extra-axial enhancement or masses. VASCULAR: Normal major intracranial vascular flow voids present at skull base. SKULL AND UPPER CERVICAL SPINE: No abnormal sellar expansion. No suspicious calvarial bone marrow signal. Craniocervical junction maintained. SINUSES/ORBITS: The mastoid air-cells and included paranasal sinuses are well-aerated.The included ocular globes and orbital contents are non-suspicious. OTHER: Secretions in the nasopharynx, life-support lines in place. IMPRESSION: Motion degraded examination. 3 x 4.3 x 4.5 cm complex RIGHT frontal lobe mass with imaging characteristics of primary brain tumor. 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 Local edema versus nonenhancing infiltrative tumor results in 2 mm RIGHT to LEFT midline shift. No ventricular entrapment. These results will be called to the ordering clinician or representative by the Radiologist Assistant, and communication documented in the [facility] Dashboard Electronically Signed   By: Elon Alas M.D.   On: 07/01/2016 16:53   Dg Chest Port 1 View  Result Date: 07/07/2016 CLINICAL DATA:  Central line placement EXAM: PORTABLE CHEST 1 VIEW  COMPARISON:  July 04, 2016 FINDINGS: The new right central line terminates in the central SVC. No pneumothorax. No  other interval change. IMPRESSION: New right central line in good position.  No pneumothorax. Electronically Signed   By: Dorise Bullion III M.D   On: 07/07/2016 16:25   Dg Chest Port 1 View  Result Date: 07/04/2016 CLINICAL DATA:  70 year old female fell and hit head 4 days ago. Cough. Subsequent encounter. EXAM: PORTABLE CHEST 1 VIEW COMPARISON:  07/03/2016 chest x-ray.  07/01/2016 chest CT. FINDINGS: Cardiomegaly. Mild central pulmonary vascular prominence without pulmonary edema, infiltrate or pneumothorax. Minimally tortuous aorta. IMPRESSION: Mild central pulmonary vascular prominence without pulmonary edema. Cardiomegaly. Electronically Signed   By: Genia Del M.D.   On: 07/04/2016 07:37   Dg Chest Port 1 View  Result Date: 07/03/2016 CLINICAL DATA:  Respiratory failure EXAM: PORTABLE CHEST 1 VIEW COMPARISON:  July 02, 2016 FINDINGS: ET and NG tubes have been removed. Mild increased interstitial markings in the lungs. No other changes. IMPRESSION: Removal of support apparatus. Suggested mild pulmonary venous congestion. Electronically Signed   By: Dorise Bullion III M.D   On: 07/03/2016 07:29   Dg Chest Port 1 View  Result Date: 07/02/2016 CLINICAL DATA:  Acute respiratory acidosis EXAM: PORTABLE CHEST 1 VIEW COMPARISON:  July 01, 2016 FINDINGS: Stable support apparatus. No pneumothorax. The cardiomediastinal silhouette is stable. No focal infiltrate. IMPRESSION: No active disease. Electronically Signed   By: Dorise Bullion III M.D   On: 07/02/2016 09:26   Dg Chest Port 1 View  Result Date: 07/01/2016 CLINICAL DATA:  Acute onset of shortness of breath. Initial encounter. EXAM: PORTABLE CHEST 1 VIEW COMPARISON:  Chest radiograph performed 10/15/2004 FINDINGS: The patient's endotracheal tube is seen ending 2-3 cm above the carina. The patient's enteric tube is noted  extending below the diaphragm. Right perihilar airspace opacity raises concern for pneumonia. No pleural effusion or pneumothorax is seen. The heart is borderline normal in size. No acute osseous abnormalities are identified. IMPRESSION: 1. Endotracheal tube seen ending 2-3 cm above the carina. 2. Right perihilar airspace opacity raises concern for pneumonia. Followup PA and lateral chest X-ray is recommended in 3-4 weeks following trial of antibiotic therapy to ensure resolution and exclude underlying malignancy. Electronically Signed   By: Garald Balding M.D.   On: 07/01/2016 03:24   Dg Abd Portable 1v  Result Date: 07/01/2016 CLINICAL DATA:  OG tube placement EXAM: PORTABLE ABDOMEN - 1 VIEW COMPARISON:  None. FINDINGS: Esophageal tube tip projects over the proximal stomach. Upper bowel gas pattern is nonobstructed. IMPRESSION: Esophageal tube tip overlies the proximal stomach Electronically Signed   By: Donavan Foil M.D.   On: 07/01/2016 02:35   Dg Abd Portable 1v  Result Date: 07/01/2016 CLINICAL DATA:  Orogastric tube placement.  Initial encounter. EXAM: PORTABLE ABDOMEN - 1 VIEW COMPARISON:  None. FINDINGS: The patient's enteric tube is noted ending overlying the body of the stomach. The visualized bowel gas pattern is unremarkable. Scattered air and stool filled loops of colon are seen; no abnormal dilatation of small bowel loops is seen to suggest small bowel obstruction. No free intra-abdominal air is identified, though evaluation for free air is limited on a single supine view. The visualized osseous structures are within normal limits; the sacroiliac joints are unremarkable in appearance. The visualized lung bases are essentially clear. IMPRESSION: Enteric tube noted ending overlying the body of the stomach. Electronically Signed   By: Garald Balding M.D.   On: 07/01/2016 02:19    Subjective:  Discharge Exam: Vitals:   07/11/16 0114 07/11/16 0500 07/11/16 0528 07/11/16 0934  BP: Marland Kitchen)  120/47  129/64 (!) 141/53  Pulse: (!) 56  63 78  Resp: 20  20 19   Temp: 98.2 F (36.8 C)  97.5 F (36.4 C) 98.1 F (36.7 C)  TempSrc: Oral  Oral Oral  SpO2: 97%  99% 99%  Weight:  85 kg (187 lb 6.3 oz)    Height:       General: Pt is alert, awake, not in acute distress Cardiovascular: RRR, S1/S2 +, no rubs, no gallops Respiratory: CTA bilaterally, no wheezing, no rhonchi Abdominal: Soft, NT, ND, bowel sounds + Extremities: no edema, no cyanosis   The results of significant diagnostics from this hospitalization (including imaging, microbiology, ancillary and laboratory) are listed below for reference.    Microbiology: Recent Results (from the past 240 hour(s))  Surgical pcr screen     Status: None   Collection Time: 07/06/16 11:21 PM  Result Value Ref Range Status   MRSA, PCR NEGATIVE NEGATIVE Final   Staphylococcus aureus NEGATIVE NEGATIVE Final    Comment:        The Xpert SA Assay (FDA approved for NASAL specimens in patients over 15 years of age), is one component of a comprehensive surveillance program.  Test performance has been validated by [facility] for patients greater than or equal to 18 year old. It is not intended to diagnose infection nor to guide or monitor treatment.      Labs: BNP (last 3 results) No results for input(s): BNP in the last 8760 hours. Basic Metabolic Panel:  Recent Labs Lab 07/05/16 0256 07/06/16 0535 07/09/16 1005  NA 140 142 137  K 3.7 3.4* 4.1  CL 110 105 105  CO2 21* 21* 19*  GLUCOSE 123* 109* 130*  BUN 15 15 14   CREATININE 0.83 0.84 0.69  CALCIUM 9.1 9.9 9.5  MG 1.8  --   --   PHOS 4.5  --   --    Liver Function Tests: No results for input(s): AST, ALT, ALKPHOS, BILITOT, PROT, ALBUMIN in the last 168 hours. No results for input(s): LIPASE, AMYLASE in the last 168 hours. No results for input(s): AMMONIA in the last 168 hours. CBC:  Recent Labs Lab 07/06/16 0535  WBC 12.1*  HGB 13.2  HCT 38.0  MCV 88.8   PLT 334   Cardiac Enzymes: No results for input(s): CKTOTAL, CKMB, CKMBINDEX, TROPONINI in the last 168 hours. BNP: Invalid input(s): POCBNP CBG:  Recent Labs Lab 07/10/16 1603 07/10/16 2118 07/11/16 0001 07/11/16 0347 07/11/16 0826  GLUCAP 153* 140* 144* 130* 115*   D-Dimer No results for input(s): DDIMER in the last 72 hours. Hgb A1c No results for input(s): HGBA1C in the last 72 hours. Lipid Profile No results for input(s): CHOL, HDL, LDLCALC, TRIG, CHOLHDL, LDLDIRECT in the last 72 hours. Thyroid function studies No results for input(s): TSH, T4TOTAL, T3FREE, THYROIDAB in the last 72 hours.  Invalid input(s): FREET3 Anemia work up No results for input(s): VITAMINB12, FOLATE, FERRITIN, TIBC, IRON, RETICCTPCT in the last 72 hours. Urinalysis    Component Value Date/Time   COLORURINE YELLOW 07/01/2016 1024   APPEARANCEUR CLEAR 07/01/2016 1024   LABSPEC 1.008 07/01/2016 1024   PHURINE 5.5 07/01/2016 1024   GLUCOSEU NEGATIVE 07/01/2016 1024   HGBUR NEGATIVE 07/01/2016 1024   BILIRUBINUR NEGATIVE 07/01/2016 1024   KETONESUR NEGATIVE 07/01/2016 1024   PROTEINUR NEGATIVE 07/01/2016 1024   NITRITE NEGATIVE 07/01/2016 1024   LEUKOCYTESUR NEGATIVE 07/01/2016 1024   Sepsis Labs Invalid input(s): PROCALCITONIN,  WBC,  LACTICIDVEN Microbiology Recent  Results (from the past 240 hour(s))  Surgical pcr screen     Status: None   Collection Time: 07/06/16 11:21 PM  Result Value Ref Range Status   MRSA, PCR NEGATIVE NEGATIVE Final   Staphylococcus aureus NEGATIVE NEGATIVE Final    Comment:        The Xpert SA Assay (FDA approved for NASAL specimens in patients over 45 years of age), is one component of a comprehensive surveillance program.  Test performance has been validated by [facility] for patients greater than or equal to 61 year old. It is not intended to diagnose infection nor to guide or monitor treatment.      Time coordinating discharge: Over 30  minutes  SIGNED:   Birdie Hopes, MD  [facility] 07/11/2016, 11:01 AM Pager   If 7PM-7AM, please contact night-coverage [email or web address] Password [email or web address]

## 2016-07-11 NOTE — Care Management Note (Signed)
Case Management Note  Patient Details  Name: Crystal Parsons MRN: [id number] Date of Birth: [birth year]  Subjective/Objective:                    Action/Plan: Pt discharging to CIR today. No further needs per CM.  Expected Discharge Date:                  Expected Discharge Plan:  [place]  In-House Referral:     Discharge planning Services  CM Consult  Post Acute Care Choice:    Choice offered to:     DME Arranged:    DME Agency:     HH Arranged:    [place] Agency:     Status of Service:  Completed, signed off  If discussed at [employer] of Stay Meetings, dates discussed:    Additional Comments:  Pollie Friar, RN 07/11/2016, 2:44 PM

## 2016-07-11 NOTE — Progress Notes (Signed)
Cristina Gong, RN Rehab Admission Coordinator Signed Physical Medicine and Rehabilitation  PMR Pre-admission Date of Service: 07/08/2016 2:08 PM  Related encounter: ED to Hosp-Admission (Current) from 06/30/2016 in [place]       [] Hide copied text PMR Admission Coordinator Pre-Admission Assessment  Patient: Crystal Parsons is an 70 y.o., female MRN: [id number] DOB: January 29, 1946 Height: 5\' 3"  (160 cm) Weight: 85 kg (187 lb 6.3 oz)                                                                                                                                                  Insurance Information HMO:     PPO:      PCP:      IPA:      80/20: yes     OTHER: no HMO PRIMARY: Medicare a and b      Policy#: [id number]      Subscriber: pt Benefits:  Phone #: passport one online     Name: 07/08/16 Eff. Date: 03/01/2010     Deduct: $1316      Out of Pocket Max: none      Life Max: none CIR: 100%      SNF: 20 full days Outpatient: 80%     Co-Pay: 20% Home Health: 100%      Co-Pay: none DME: 80%     Co-Pay: 20% Providers: pt choice  SECONDARY: BCBS supplement      Policy#: [id number]      Subscriber: pt  Medicaid Application Date:       Case Manager:  Disability Application Date:       Case Worker:   Emergency Contact Information        Contact Information    Name Relation Home Work Mobile   Schuler,John Spouse [phone]  [phone]     Current Medical History  Patient Admitting Diagnosis: 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 MRI brain done revealing 3 x 4.3 X 4/5 cm right frontal mass--likely primary brain tumor and second sub cm right frontal lobe mass consistent with GBM, local edema v/s  infiltrative tumor. She was extubated without difficulty and was noted to have mild LUE weakness, cognitive deficits affecting processing, attention and memory. Surgical intervention recommended by Dr. Kathyrn Sheriff and she underwent stereotactic right frontal crani for tumor resection on 12/7. Post op noted to have lethargy, nausea, apraxia as well as bradycardia affecting activity.   Past Medical History      Past Medical History:  Diagnosis Date  . Anxiety   . Bradycardia    At times with pulse in  the 40s  . Diabetes ([place])   . Diarrhea    With blating, improved with gluten-free diet  . Diastolic dysfunction   . Essential hypertension, benign    Always has HTN when at the doctor's office.  . Gout   . Hyperlipidemia   . Lynch syndrome   . Mild aortic sclerosis ([place])   . Mitral valve problem    Mildly thickened mitral valve  . Mitral valve prolapse    Mild, anterior  . MR (mitral regurgitation)    ECHO 08/10/09 shows mild MR again and normal EF. No significant changes from prior ECHO.  Marland Kitchen Palpitations    Occasional, but are not significant. ECHO 08/20/08 - Normal EF (60%), mildly thickened mitral valve with mild anterior mitral valve prolapse, mild MR, mild aortic sclerosis, grade 1 diastolic dysfunction.  . Proteinuria    Likely secondary to Diabetes    Family History  family history includes Cancer in her father and mother; Colon cancer in her father and mother.  Prior Rehab/Hospitalizations:  Has the patient had major surgery during 100 days prior to admission? No  Current Medications   Current Facility-Administered Medications:  .  0.9 %  sodium chloride infusion, , Intravenous, Continuous, Raylene Miyamoto, MD, Stopped at 07/07/16 2000 .  acetaminophen (TYLENOL) tablet 650 mg, 650 mg, Oral, Q6H PRN, Newman Pies, MD, 650 mg at 07/10/16 1004 .  allopurinol (ZYLOPRIM) tablet 300 mg, 300 mg, Oral, Daily, Consuella Lose, MD, 300 mg at  07/11/16 1030 .  bisacodyl (DULCOLAX) suppository 10 mg, 10 mg, Rectal, Daily PRN, Consuella Lose, MD .  6 CHG cloth bath night before surgery, , , Once **AND** 6 CHG cloth bath AM of surgery, , , Once **AND** [COMPLETED] Chlorhexidine Gluconate Cloth 2 % PADS 6 each, 6 each, Topical, Once, 6 each at 07/07/16 1030 **AND** Chlorhexidine Gluconate Cloth 2 % PADS 6 each, 6 each, Topical, Once, Consuella Lose, MD .  cholecalciferol (VITAMIN D) tablet 2,000 Units, 2,000 Units, Oral, Daily, Rush Farmer, MD, 2,000 Units at 07/11/16 1030 .  colchicine tablet 0.6 mg, 0.6 mg, Oral, Daily, Consuella Lose, MD, 0.6 mg at 07/11/16 1030 .  [COMPLETED] dexamethasone (DECADRON) injection 6 mg, 6 mg, Intravenous, Q6H, 6 mg at 07/08/16 1258 **FOLLOWED BY** [COMPLETED] dexamethasone (DECADRON) injection 4 mg, 4 mg, Intravenous, Q6H, 4 mg at 07/09/16 1204 **FOLLOWED BY** dexamethasone (DECADRON) injection 4 mg, 4 mg, Intravenous, Q8H, Consuella Lose, MD, 4 mg at 07/11/16 0504 .  famotidine (PEPCID) tablet 20 mg, 20 mg, Oral, BID, Rush Farmer, MD, 20 mg at 07/11/16 1030 .  hydrALAZINE (APRESOLINE) tablet 25 mg, 25 mg, Oral, Q8H, Raylene Miyamoto, MD, 25 mg at 07/11/16 0504 .  hydrochlorothiazide (MICROZIDE) capsule 12.5 mg, 12.5 mg, Oral, Daily, Rush Farmer, MD, 12.5 mg at 07/11/16 1030 .  HYDROcodone-acetaminophen (NORCO/VICODIN) 5-325 MG per tablet 1 tablet, 1 tablet, Oral, Q4H PRN, Consuella Lose, MD, 1 tablet at 07/11/16 0504 .  labetalol (NORMODYNE,TRANDATE) injection 10 mg, 10 mg, Intravenous, Q10 min PRN, Corey Harold, NP .  levETIRAcetam (KEPPRA) tablet 1,000 mg, 1,000 mg, Oral, BID, Verlee Monte, MD, 1,000 mg at 07/11/16 1029 .  losartan (COZAAR) tablet 50 mg, 50 mg, Oral, Daily, Rush Farmer, MD, 50 mg at 07/11/16 1030 .  MEDLINE mouth rinse, 15 mL, Mouth Rinse, BID, Javier Glazier, MD, 15 mL at 07/10/16 2103 .  morphine 2 MG/ML injection 1-2 mg, 1-2 mg, Intravenous, Q2H PRN,  Consuella Lose, MD, 2 mg  at 07/08/16 0511 .  multivitamin with minerals tablet 1 tablet, 1 tablet, Oral, Daily, Rush Farmer, MD, 1 tablet at 07/11/16 1029 .  ondansetron (ZOFRAN) tablet 4 mg, 4 mg, Oral, Q4H PRN **OR** ondansetron (ZOFRAN) injection 4 mg, 4 mg, Intravenous, Q4H PRN, Consuella Lose, MD, 4 mg at 07/08/16 2032 .  phenol (CHLORASEPTIC) mouth spray 1 spray, 1 spray, Mouth/Throat, PRN, Rush Farmer, MD, 1 spray at 07/02/16 1315 .  pravastatin (PRAVACHOL) tablet 40 mg, 40 mg, Oral, q1800, Rush Farmer, MD, 40 mg at 07/10/16 1718 .  promethazine (PHENERGAN) tablet 12.5-25 mg, 12.5-25 mg, Oral, Q4H PRN, Consuella Lose, MD .  senna-docusate (Senokot-S) tablet 1 tablet, 1 tablet, Oral, QHS PRN, Consuella Lose, MD  Patients Current Diet: DIET DYS 3 Room service appropriate? Yes; Fluid consistency: Thin  Precautions / Restrictions Precautions Precautions: Fall Precaution Comments: L neglect Restrictions Weight Bearing Restrictions: No   Has the patient had 2 or more falls or a fall with injury in the past year?No  Prior Activity Level Community (5-7x/wk): independent and driving pta; just drove to [place] and back visiting daughter. Pt loves to cook, knit and needle point. Very active and independent. Spouse states she is very modest with bathing, dressing and using BSC.  Home Assistive Devices / Equipment Home Assistive Devices/Equipment: None Home Equipment: None  Prior Device Use: Indicate devices/aids used by the patient prior to current illness, exacerbation or injury? None of the above  Prior Functional Level Prior Function Level of Independence: Independent Comments: loves to knit and needlepoint, great cook  Self Care: Did the patient need help bathing, dressing, using the toilet or eating?  Independent  Indoor Mobility: Did the patient need assistance with walking from room to room (with or without device)? Independent  Stairs: Did the  patient need assistance with internal or external stairs (with or without device)? Independent  Functional Cognition: Did the patient need help planning regular tasks such as shopping or remembering to take medications? Independent  Current Functional Level Cognition Arousal/Alertness: Awake/alert Overall Cognitive Status: Impaired/Different from baseline Current Attention Level: Sustained Orientation Level: Oriented X4 Following Commands: Follows one step commands with increased time Safety/Judgement: Decreased awareness of safety, Decreased awareness of deficits General Comments: Internally distracted. Continued cues to take steps in order to walk.  Attention: Divided Sustained Attention: Appears intact Divided Attention: Impaired Divided Attention Impairment: Verbal basic, Functional basic Memory: Appears intact (8 on Cognistat WNL for her age) Awareness: Impaired Awareness Impairment: Anticipatory impairment, Emergent impairment, Intellectual impairment Problem Solving:  (verbal WFL, suspect difficulty w/ functional) Safety/Judgment: Impaired    Extremity Assessment (includes Sensation/Coordination) Upper Extremity Assessment: LUE deficits/detail RUE Deficits / Details: Pt unable to complete hand to mouth pattern. Able to actively flex shoulder @ 30 degrees. gross grasp/release but unable to make full fist. Not useg hand functionally at this time. Poor attention to L hand/UE RUE:  (general weakness) LUE Coordination: decreased fine motor, decreased gross motor  Lower Extremity Assessment: Defer to PT evaluation LLE Deficits / Details: Family indicate hx of ankle fx and pt c/o feeling like gout in her foot.   LLE Coordination: decreased fine motor   ADLs Overall ADL's : Needs assistance/impaired Eating/Feeding: Minimal assistance, Sitting Eating/Feeding Details (indicate cue type and reason): cuign to attend to task. Will furtehr assess Grooming: Moderate assistance Grooming  Details (indicate cue type and reason): unable to use LUE to assist with grooming Upper Body Bathing: Moderate assistance, Sitting Lower Body Bathing: Maximal assistance, Sit to/from  stand Lower Body Bathing Details (indicate cue type and reason): Limited by poor attention, poor initiation and decreased ability to use LUE funcitonally Upper Body Dressing : Maximal assistance Lower Body Dressing: Maximal assistance Lower Body Dressing Details (indicate cue type and reason): Assist for starting pants over L foot. Toilet Transfer: Stand-pivot, Moderate assistance, Cueing for safety, Cueing for sequencing, BSC Toileting- Clothing Manipulation and Hygiene: Maximal assistance Toileting - Clothing Manipulation Details (indicate cue type and reason): Pt incontinent of urine during session. Husband states this is new Tub/ Banker: Supervision/safety, Walk-in shower, Ambulation Functional mobility during ADLs: Moderate assistance, Cueing for safety, Cueing for sequencing General ADL Comments: Significant decline in mobility. Pt running into items on L   Mobility Overal bed mobility: Needs Assistance Bed Mobility: Supine to Sit Supine to sit: Max assist, HOB elevated Sit to supine: Modified independent (Device/Increase time) General bed mobility comments: OOB in chair   Transfers Overall transfer level: Needs assistance Equipment used: Rolling walker (2 wheeled) Transfers: Sit to/from Stand Sit to Stand: Mod assist, +2 safety/equipment Stand pivot transfers: Mod assist, +2 physical assistance General transfer comment: difficulty with negotiating space with trnasfers   Ambulation / Gait / Stairs / Wheelchair Mobility Ambulation/Gait Ambulation/Gait assistance: Min guard Ambulation Distance (Feet): 130 Feet Assistive device: None Gait Pattern/deviations: Decreased stride length, Step-through pattern General Gait Details: 2 episodes of mild LOB but able to recover with steadying assist.  Decreased awareness of surroundings in hallway noted requiring cues  Gait velocity: decreased   Posture / Balance Dynamic Sitting Balance Sitting balance - Comments: Requires Mod A-Min guard assist at times with posterior lean.  Balance Overall balance assessment: Needs assistance Sitting-balance support: Feet supported, No upper extremity supported Sitting balance-Leahy Scale: Fair Sitting balance - Comments: Requires Mod A-Min guard assist at times with posterior lean.  Standing balance support: During functional activity Standing balance-Leahy Scale: Poor Standing balance comment: Reliant on BUEs for support in standing.   Special needs/care consideration BiPAP/CPAP  N/a CPM  N/a Continuous Drip IV n/a Dialysis  N/a Life Vest  N/a Oxygen  N/a Special Bed  N/a Trach Size  N/a Wound Vac (area)  N/a Skin surgical incision                             Bowel mgmt: incontinent LBM 12/11 Bladder mgmt: incontinent Diabetic mgmt yes   Previous Home Environment Living Arrangements: Spouse/significant other  Lives With: Spouse Available Help at Discharge: Family, Available 24 hours/day Type of Home: House Home Layout: One level Home Access: Stairs to enter Entrance Stairs-Rails: None Entrance Stairs-Number of Steps: 2 step entry with no rial and alos one step entry with fence post to hold onto [employer] Shower/Tub: Multimedia programmer: Standard Bathroom Accessibility: Yes How Accessible: Accessible via walker [place]: No  Discharge Living Setting Plans for Discharge Living Setting: Patient's home, Lives with (comment) Type of Home at Discharge: House Discharge Home Layout: One level Discharge Home Access: Stairs to enter Entrance Stairs-Rails: None Entrance Stairs-Number of Steps: 2 step entry with no rail and one step entry with fence post to hold onto Discharge Bathroom Shower/Tub: Walk-in shower Discharge Bathroom Toilet: Standard Discharge Bathroom  Accessibility: Yes How Accessible: Accessible via walker Does the patient have any problems obtaining your medications?: No  Social/Family/Support Systems Patient Roles: Spouse, Parent Contact Information: Helene Shoe Anticipated Caregiver: spouse Anticipated Caregiver's Contact Information: see above Ability/Limitations of Caregiver: no limitations Caregiver Availability:  24/7 Discharge Plan Discussed with Primary Caregiver: Yes Is Caregiver In Agreement with Plan?: Yes Does Caregiver/Family have Issues with Lodging/Transportation while Pt is in Rehab?: No  Goals/Additional Needs Patient/Family Goal for Rehab: Mod I to supervision with PT, OT, and SLP Expected length of stay: ELOS 11-17 days Pt/Family Agrees to Admission and willing to participate: Yes Program Orientation Provided & Reviewed with Pt/Caregiver Including Roles  & Responsibilities: Yes  Decrease burden of Care through IP rehab admission: n/a  Possible need for SNF placement upon discharge:not anticipated  Patient Condition: This patient's medical and functional status has changed since the consult dated: 07/08/2016 in which the Rehabilitation Physician determined and documented that the patient's condition is appropriate for intensive rehabilitative care in an inpatient rehabilitation facility. See "History of Present Illness" (above) for medical update. Functional changes are: overall mod assist. Patient's medical and functional status update has been discussed with the Rehabilitation physician and patient remains appropriate for inpatient rehabilitation. Will admit to inpatient rehab today.  Preadmission Screen Completed By:  Cleatrice Burke, 07/11/2016 11:11 AM ______________________________________________________________________   Discussed status with Dr. Posey Pronto on 07/11/2016 at 8 and received telephone approval for admission today.  Admission Coordinator:  Cleatrice Burke, time [id number] Date  07/11/2016       Cosigned by: Ankit Lorie Phenix, MD at 07/11/2016 11:51 AM  Revision History

## 2016-07-12 ENCOUNTER — Inpatient Hospital Stay (HOSPITAL_COMMUNITY): Payer: Medicare Other | Admitting: Physical Therapy

## 2016-07-12 ENCOUNTER — Inpatient Hospital Stay (HOSPITAL_COMMUNITY): Payer: Medicare Other | Admitting: Occupational Therapy

## 2016-07-12 ENCOUNTER — Inpatient Hospital Stay (HOSPITAL_COMMUNITY): Payer: Medicare Other | Admitting: Speech Pathology

## 2016-07-12 DIAGNOSIS — C711 Malignant neoplasm of frontal lobe: Secondary | ICD-10-CM

## 2016-07-12 DIAGNOSIS — T380X5A Adverse effect of glucocorticoids and synthetic analogues, initial encounter: Secondary | ICD-10-CM

## 2016-07-12 DIAGNOSIS — C719 Malignant neoplasm of brain, unspecified: Secondary | ICD-10-CM

## 2016-07-12 DIAGNOSIS — R7309 Other abnormal glucose: Secondary | ICD-10-CM

## 2016-07-12 DIAGNOSIS — N390 Urinary tract infection, site not specified: Secondary | ICD-10-CM

## 2016-07-12 DIAGNOSIS — I10 Essential (primary) hypertension: Secondary | ICD-10-CM

## 2016-07-12 DIAGNOSIS — R739 Hyperglycemia, unspecified: Secondary | ICD-10-CM

## 2016-07-12 DIAGNOSIS — D72821 Monocytosis (symptomatic): Secondary | ICD-10-CM

## 2016-07-12 LAB — CBC WITH DIFFERENTIAL/PLATELET
BASOS ABS: 0 10*3/uL (ref 0.0–0.1)
Basophils Relative: 0 %
EOS ABS: 0 10*3/uL (ref 0.0–0.7)
EOS PCT: 0 %
HCT: 38 % (ref 36.0–46.0)
HEMOGLOBIN: 13 g/dL (ref 12.0–15.0)
LYMPHS ABS: 2.1 10*3/uL (ref 0.7–4.0)
LYMPHS PCT: 12 %
MCH: 30.3 pg (ref 26.0–34.0)
MCHC: 34.2 g/dL (ref 30.0–36.0)
MCV: 88.6 fL (ref 78.0–100.0)
Monocytes Absolute: 1.3 10*3/uL — ABNORMAL HIGH (ref 0.1–1.0)
Monocytes Relative: 7 %
NEUTROS PCT: 81 %
Neutro Abs: 14.2 10*3/uL — ABNORMAL HIGH (ref 1.7–7.7)
PLATELETS: 506 10*3/uL — AB (ref 150–400)
RBC: 4.29 MIL/uL (ref 3.87–5.11)
RDW: 12.5 % (ref 11.5–15.5)
WBC: 17.6 10*3/uL — AB (ref 4.0–10.5)

## 2016-07-12 LAB — GLUCOSE, CAPILLARY
GLUCOSE-CAPILLARY: 115 mg/dL — AB (ref 65–99)
GLUCOSE-CAPILLARY: 127 mg/dL — AB (ref 65–99)
Glucose-Capillary: 134 mg/dL — ABNORMAL HIGH (ref 65–99)
Glucose-Capillary: 91 mg/dL (ref 65–99)

## 2016-07-12 LAB — URINALYSIS, ROUTINE W REFLEX MICROSCOPIC
BILIRUBIN URINE: NEGATIVE
Glucose, UA: NEGATIVE mg/dL
HGB URINE DIPSTICK: NEGATIVE
Ketones, ur: NEGATIVE mg/dL
Nitrite: NEGATIVE
PROTEIN: NEGATIVE mg/dL
SPECIFIC GRAVITY, URINE: 1.01 (ref 1.005–1.030)
pH: 6 (ref 5.0–8.0)

## 2016-07-12 LAB — COMPREHENSIVE METABOLIC PANEL
ALT: 66 U/L — AB (ref 14–54)
AST: 37 U/L (ref 15–41)
Albumin: 3.5 g/dL (ref 3.5–5.0)
Alkaline Phosphatase: 69 U/L (ref 38–126)
Anion gap: 14 (ref 5–15)
BUN: 26 mg/dL — AB (ref 6–20)
CALCIUM: 9.9 mg/dL (ref 8.9–10.3)
CHLORIDE: 105 mmol/L (ref 101–111)
CO2: 18 mmol/L — AB (ref 22–32)
CREATININE: 0.73 mg/dL (ref 0.44–1.00)
GFR calc non Af Amer: >60 mL/min (ref >60)
GLUCOSE: 122 mg/dL — AB (ref 65–99)
Potassium: 4 mmol/L (ref 3.5–5.1)
SODIUM: 137 mmol/L (ref 135–145)
Total Bilirubin: 0.9 mg/dL (ref 0.3–1.2)
Total Protein: 7.3 g/dL (ref 6.5–8.1)

## 2016-07-12 MED ORDER — NITROFURANTOIN MONOHYD MACRO 100 MG PO CAPS
100.0000 mg | ORAL_CAPSULE | Freq: Two times a day (BID) | ORAL | Status: AC
Start: 2016-07-12 — End: 2016-07-18
  Administered 2016-07-12 – 2016-07-18 (×14): 100 mg via ORAL
  Filled 2016-07-12 (×14): qty 1

## 2016-07-12 NOTE — Progress Notes (Signed)
Nutrition Brief Note  Patient identified on the Malnutrition Screening Tool (MST) Report  Wt Readings from Last 15 Encounters:  07/12/16 185 lb 13.6 oz (84.3 kg)  07/11/16 187 lb 6.3 oz (85 kg)  06/10/13 175 lb (79.4 kg)    Body mass index is 32.92 kg/m. Patient meets criteria for class I obesity based on current BMI. Weight has been stable.  Current diet order is dysphagia 3 carbohydrate modified diet, patient is consuming approximately 75-85% of meals at this time. Pt reports eating at meals with no difficulties. Labs and medications reviewed.   No nutrition interventions warranted at this time. If nutrition issues arise, please consult RD.   Corrin Parker, MS, RD, LDN Pager # [phone] After hours/ weekend pager # [phone]

## 2016-07-12 NOTE — Progress Notes (Signed)
Patient information reviewed and entered into eRehab system by Tonita Bills, RN, CRRN, PPS Coordinator.  Information including medical coding and functional independence measure will be reviewed and updated through discharge.     Per nursing patient was given "Data Collection Information Summary for Patients in Inpatient Rehabilitation Facilities with attached "Privacy Act Statement-Health Care Records" upon admission.  

## 2016-07-12 NOTE — IPOC Note (Signed)
Overall Plan of Care Correct Care Of Minor) Patient Details Name: Crystal Parsons MRN: [id number] DOB: 1946/07/17  Admitting Diagnosis: Tumore Rejection [facility] Problems: Active Problems:   Malignant frontal lobe tumor ([place])   Monocytosis   Acute lower UTI   GBM (glioblastoma multiforme) (H[facility])     Functional Problem List: Nursing Bladder, Bowel, Endurance, Medication Management, Motor, Nutrition, Pain, Perception, Safety, Skin Integrity  PT Balance, Endurance, Motor, Pain, Perception, Safety  OT Balance, Cognition, Endurance, Motor, Pain, Safety, Vision  SLP Cognition, Nutrition  TR         Basic ADL's: OT Grooming, Bathing, Dressing, Toileting, Eating     Advanced  ADL's: OT       Transfers: PT Bed Mobility, Bed to Chair, Car, Manufacturing systems engineer, Metallurgist: PT Ambulation, Stairs     Additional Impairments: OT Fuctional Use of Upper Extremity  SLP Swallowing, Communication, Social Cognition expression Problem Solving, Attention, Awareness, Memory  TR      Anticipated Outcomes Item Anticipated Outcome  Self Feeding overall supervision   Swallowing  mod I    Basic self-care  overall supervision  Toileting  overall supervision   Bathroom Transfers overall supervision  Bowel/Bladder  Patient will manage bowel and bladder with min assist  Transfers  Supervision  Locomotion  Supervision  Communication  mod I   Cognition  Min assist   Pain  less than 3  Safety/Judgment  Patient will remain free of falls, skin breakdown and infection while on rehab   Therapy Plan: PT Intensity: Minimum of 1-2 x/day ,45 to 90 minutes PT Frequency: 5 out of 7 days PT Duration Estimated Length of Stay: 2-2.5 weeks OT Intensity: Minimum of 1-2 x/day, 45 to 90 minutes OT Frequency: 5 out of 7 days OT Duration/Estimated Length of Stay: 14-17 days SLP Intensity: Minumum of 1-2 x/day, 30 to 90 minutes SLP Frequency: 3 to 5 out of 7 days SLP Duration/Estimated  Length of Stay: 14-17 days        Team Interventions: Nursing Interventions Patient/Family Education, Bladder Management, Bowel Management, Disease Management/Prevention, Pain Management, Medication Management, Cognitive Remediation/Compensation, Dysphagia/Aspiration Precaution Training, Discharge Planning, Psychosocial Support  PT interventions Ambulation/gait training, Training and development officer, Cognitive remediation/compensation, Community reintegration, Discharge planning, DME/adaptive equipment instruction, Functional mobility training, Neuromuscular re-education, Pain management, Patient/family education, Psychosocial support, Splinting/orthotics, Stair training, Therapeutic Activities, Therapeutic Exercise, UE/LE Strength taining/ROM, UE/LE Coordination activities, Visual/perceptual remediation/compensation  OT Interventions Training and development officer, Community reintegration, Discharge planning, Functional mobility training, Psychosocial support, Therapeutic Activities, Visual/perceptual remediation/compensation, UE/LE Coordination activities, Patient/family education, Cognitive remediation/compensation, DME/adaptive equipment instruction, Pain management, UE/LE Strength taining/ROM, Wheelchair propulsion/positioning, Therapeutic Exercise, Self Care/advanced ADL retraining, Neuromuscular re-education  SLP Interventions Cognitive remediation/compensation, Cueing hierarchy, Dysphagia/aspiration precaution training, Environmental controls, Functional tasks, Internal/external aids, Patient/family education  TR Interventions    SW/CM Interventions Discharge Planning, Psychosocial Support, Patient/Family Education    Team Discharge Planning: Destination: PT-Home ,OT- Home , SLP-Home Projected Follow-up: PT-Home health PT, Outpatient PT, 24 hour supervision/assistance, OT-  Home health OT, SLP-Home Health SLP, Outpatient SLP, 24 hour supervision/assistance Projected Equipment Needs: PT-To be  determined, OT- To be determined, SLP-None recommended by SLP Equipment Details: PT- , OT-  Patient/family involved in discharge planning: PT- Patient,  OT-Patient, SLP-Patient, Family member/caregiver  MD ELOS: 14-17 days. Medical Rehab Prognosis:  Fair Assessment: 70 y.o.femalewith history of HTN, Lynch syndrome, palpitations, recent gout flare who was admitted on 07/01/16 with multiple episodes of seizures and requires intubation in ED for airway protection. MRI brain  done revealing 3 x 4.3 X 4/5 cm right frontal mass--later found to be GBM and second sub cm right frontal lobe mass consistent with GBM, local edema v/s infiltrative tumor. She was extubated without difficulty and was noted to have mild LUE weakness, cognitive deficits affecting processing, attention and memory. Surgical intervention recommended by Dr. Kathyrn Sheriff and she underwent stereotactic right frontal crani for tumor resection on 12/7. On decadron for management of edema and on keppra for seizures. She has had issues with bradycardia as well as hypoxia post op. Somnolence resolving and patient with resultant apraxia, left sided weakness, cognitive deficits, decreased attention as well as deficits in mobility and self care tasks. Will set goals for supervsion with PT/OT and min A with cognition.   See Team Conference Notes for weekly updates to the plan of care

## 2016-07-12 NOTE — Progress Notes (Addendum)
PHYSICAL MEDICINE & REHABILITATION     PROGRESS NOTE  Subjective/Complaints:  Pt seen sitting up in bed, eating breakfast.  She remains very flat.  She states she slept well and is ready to begin therapies.   ROS: Denies CP, SOB, N/V/D.  Objective: Vital Signs: Blood pressure 132/77, pulse 71, temperature 97.3 F (36.3 C), temperature source Oral, resp. rate 18, height 5\' 3"  (1.6 m), weight 84.3 kg (185 lb 13.6 oz), SpO2 96 %. No results found.  Recent Labs  07/12/16 0635  WBC 17.6*  HGB 13.0  HCT 38.0  PLT 506*    Recent Labs  07/09/16 1005 07/12/16 0635  NA 137 137  K 4.1 4.0  CL 105 105  GLUCOSE 130* 122*  BUN 14 26*  CREATININE 0.69 0.73  CALCIUM 9.5 9.9   CBG (last 3)   Recent Labs  07/11/16 1652 07/11/16 2029 07/12/16 0634  GLUCAP 133* 247* 134*    Wt Readings from Last 3 Encounters:  07/12/16 84.3 kg (185 lb 13.6 oz)  07/11/16 85 kg (187 lb 6.3 oz)  06/10/13 79.4 kg (175 lb)    Physical Exam:  BP 132/77 (BP Location: Right Arm)   Pulse 71   Temp 97.3 F (36.3 C) (Oral)   Resp 18   Ht 5\' 3"  (1.6 m)   Wt 84.3 kg (185 lb 13.6 oz)   SpO2 96%   BMI 32.92 kg/m  Constitutional: She appears well-developed and well-nourished. NAD. HENT: Normocephalic.  Eyes: EOM are normal. No discharge.  Cardiovascular: Normal rate and regular rhythm.  No JVD. Respiratory: Effort normal and breath sounds normal.  GI: Soft. Bowel sounds are normal.  Musculoskeletal: She exhibits edema. She exhibits no tenderness.  Neurological:  She is alert.  Left facial weakness  Motor: 4-/5 throughout (left slightly weaker than right).  Apraxia LUE.  Dysphonia noted.  Skin: Skin is warm and dry.  Right curvilinear scalp incision clean,dry, intact with staples in place.   Psychiatric: Her speech is delayed. She is slowed. She expresses inappropriate judgment.   Assessment/Plan: 1. Functional deficits secondary to GBM which require 3+ hours per day of  interdisciplinary therapy in a comprehensive inpatient rehab setting. Physiatrist is providing close team supervision and 24 hour management of active medical problems listed below. Physiatrist and rehab team continue to assess barriers to discharge/monitor patient progress toward functional and medical goals.  Function:  Bathing Bathing position      Bathing parts      Bathing assist        Upper Body Dressing/Undressing Upper body dressing                    Upper body assist        Lower Body Dressing/Undressing Lower body dressing                                  Lower body assist        Toileting Toileting          Toileting assist     Transfers Chair/bed transfer Chair/bed transfer activity did not occur: Safety/medical concerns           Manufacturing systems engineer          Cognition Comprehension Comprehension assist level: Understands basic 50 - 74% of the time/ requires cueing 25 - 49%  of the time  Expression Expression assist level: Expresses basic 50 - 74% of the time/requires cueing 25 - 49% of the time. Needs to repeat parts of sentences.  Social Interaction Social Interaction assist level: Interacts appropriately 50 - 74% of the time - May be physically or verbally inappropriate.  Problem Solving Problem solving assist level: Solves basic 50 - 74% of the time/requires cueing 25 - 49% of the time  Memory      Medical Problem List and Plan: 1.  Apraxia, left sided weakness, cognitive deficits, decreased attention as well as deficits in mobility and self care tasks secondary to GBM.   Begin CIR 2.  DVT Prophylaxis/Anticoagulation: Mechanical:  Antiembolism stockings, knee (TED hose) Bilateral lower extremities Sequential compression devices, below knee Bilateral lower extremities 3. Pain Management: tylenol effective.  4. Mood: LCSW to follow for evaluation and support.  5. Neuropsych: This patient is not  fully capable of making decisions on her own behalf. 6. Skin/Wound Care: Monitor incision for healing. Maintain adequate nutritional and hydration status.  7. Fluids/Electrolytes/Nutrition: Monitor I/Os.  BMP within acceptable range on 12/12 8. New onset seizures: Managed by keppra.  9. Leucocytosis:   Likely reactive due to steroids.   WBCs 17.6 on 12/12  Monitor for signs of infection.  10. HTN: Monitor tid. Continue Apresoline, Cozaar and HCTZ.   Stable 12/12  Monitor with increased activity 11. Steroid induced hyperglycemia: Hgb A1c- 5.5. Will monitor BS ac/hs and use SSI for elevated BS.   Slightly labile  Monitor with increased activity 12. H/o gout: - Controlled on allopurinol and colchicine. 13. Acute lower UTI:   UA+, Ucx pending  Empiric macrobid started 12/12-12/18  LOS (Days) 1 A FACE TO FACE EVALUATION WAS PERFORMED  Atisha Hamidi Lorie Phenix 07/12/2016 9:31 AM

## 2016-07-12 NOTE — Progress Notes (Signed)
No issues overnight. Pt seen this am. Minimal complaints.   EXAM:  BP (!) 139/58 (BP Location: Left Arm)   Pulse 67   Temp 98.4 F (36.9 C) (Oral)   Resp 20   Ht 5\' 3"  (1.6 m)   Wt 85 kg (187 lb 6.3 oz)   SpO2 98%   BMI 33.19 kg/m   Awake, alert, oriented  Speech fluent, appropriate  CN grossly intact  5/5 BUE/BLE x mild distal LUE weakness 4-4+/5  IMPRESSION:  70 y.o. female s/p resection of large right frontal tumor, essentially at baseline  PLAN: - Would be stable for transfer to CIR today - Staples out in about 10 days

## 2016-07-12 NOTE — Evaluation (Signed)
Speech Language Pathology Assessment and Plan  Patient Details  Name: Crystal Parsons MRN: [id number] Date of Birth: August 26, 1945  SLP Diagnosis: Cognitive Impairments;Dysphagia;Voice disorder  Rehab Potential: Good ELOS: 14-17 days     Today's Date: 07/12/2016 SLP Individual Time: 1430-1530 SLP Individual Time Calculation (min): 60 min    Problem List:  Patient Active Problem List   Diagnosis Date Noted  . Monocytosis   . Acute lower UTI   . GBM (glioblastoma multiforme) ([place])   . 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 MR (mitral regurgitation)   . Brain tumor ([place])   . Apraxia   . Left-sided weakness   . Steroid-induced hyperglycemia   . Leucocytosis   . Benign essential HTN   . History of gout   . Dysuria   . Acute respiratory failure with hypoxia ([place])   . Brain mass   . Seizure ([place]) 06/30/2016  . Status epilepticus ([place]) 06/30/2016   Past Medical History:  Past Medical History:  Diagnosis Date  . Anxiety   . Bradycardia    At times with pulse in the 40s  . Diarrhea    With blating, improved with gluten-free diet  . Diastolic dysfunction   . Essential hypertension, benign    Always has HTN when at the doctor's office.  . Gout   . Hyperlipidemia   . Lynch syndrome   . Mild aortic sclerosis ([place])   . Mitral valve problem    Mildly thickened mitral valve  . Mitral valve prolapse    Mild, anterior  . MR (mitral regurgitation)    ECHO 08/10/09 shows mild MR again and normal EF. No significant changes from prior ECHO.  Marland Kitchen Palpitations    Occasional, but are not significant. ECHO 08/20/08 - Normal EF (60%), mildly thickened mitral valve with mild anterior mitral valve prolapse, mild MR, mild aortic sclerosis, grade 1 diastolic dysfunction.  . Proteinuria    Likely secondary to Diabetes   Past Surgical History:  Past Surgical History:  Procedure Laterality Date  . ABDOMINAL HYSTERECTOMY    . APPLICATION OF CRANIAL NAVIGATION Right 07/07/2016   Procedure: APPLICATION OF CRANIAL NAVIGATION;  Surgeon: Consuella Lose, MD;  Location: [facility];  Service: Neurosurgery;  Laterality: Right;  . BREAST LUMPECTOMY Left   . CRANIOTOMY Right 07/07/2016   Procedure: CRANIOTOMY TUMOR EXCISION w/BrainLab;  Surgeon: Consuella Lose, MD;  Location: [facility];  Service: Neurosurgery;  Laterality: Right;  . HEMORROIDECTOMY  2009  . TUBAL LIGATION      Assessment / Plan / Recommendation Clinical Impression   Crystal D Comptonis a 70 y.o.femalewith history of HTN, Lynch syndrome, palpitations, recent gout flare who was admitted on 07/01/16 with multiple episodes of seizures and required intubation in ED for airway protection. 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 She was extubated without difficulty and was noted to have mild LUE weakness, cognitive deficits affecting processing, attention and memory. Surgical intervention recommended by Dr. Kathyrn Sheriff and she underwent stereotactic right frontal crani for tumor resection on 12/7.  Pt admitted for CIR on 07/11/2016.  SLP evaluation completed on 07/12/2016 with the following results:  Pt presents with moderate cognitive impairments consistent with right brain dysfunction characterized by decreased awareness of limitations, decreased visual scanning to the left of midline, decreased functional problem solving, and impaired selective attention to tasks.   Pt also  presents with decreased speech intelligibility in the setting of impaired breath support for speech coinciding with breathy vocal quality, possibly due to brief intubation.   Pt demonstrates a mild oral dysphagia with cognitive>motoric etiology characterized by prolonged mastication and oral transit of solids resulting from decreased attention to boluses.  No overt s/s of aspiration evident with solids or liquids,  even when challenged to take multiple consecutive sips via straw.   Given the abovementioned deficits, pt would benefit from skilled ST while inpatient in order to maximize functional independence and reduce burden of care prior to discharge.     Skilled Therapeutic Interventions          Cognitive-linguistic evaluation completed with results and recommendations reviewed with patient and family.   Pt needed mod assist verbal cues throughout therapy session for task initiation and sequencing.  Pt with limited awareness of deficits and reports the only deficits she notices post surgery is generalized "slowness."  Pt noted with left inattention during structured table top tasks but was able to make eye contact with therapist when seated on her left in ~50% of opportunities.  Pt left in recliner with husband at bedside.      SLP Assessment  Patient will need skilled Speech Lanaguage Pathology Services during CIR admission    Recommendations  SLP Diet Recommendations: Dysphagia 3 (Mech soft);Thin Liquid Administration via: Cup;Straw Medication Administration: Whole meds with liquid Supervision: Patient able to self feed;Intermittent supervision to cue for compensatory strategies Compensations: Slow rate;Small sips/bites;Lingual sweep for clearance of pocketing Postural Changes and/or Swallow Maneuvers: Seated upright 90 degrees Oral Care Recommendations: Oral care BID Recommendations for Other Services: Neuropsych consult Patient destination: Home Follow up Recommendations: Home Health SLP;Outpatient SLP;24 hour supervision/assistance Equipment Recommended: None recommended by SLP    SLP Frequency 3 to 5 out of 7 days   SLP Duration  SLP Intensity  SLP Treatment/Interventions 14-17 days   Minumum of 1-2 x/day, 30 to 90 minutes  Cognitive remediation/compensation;Cueing hierarchy;Dysphagia/aspiration precaution training;Environmental controls;Functional tasks;Internal/external  aids;Patient/family education    Pain Pain Assessment Pain Assessment: No/denies pain  Prior Functioning Cognitive/Linguistic Baseline: Within functional limits Type of Home: House  Lives With: Spouse Available Help at Discharge: Family;Available 24 hours/day Vocation: Retired  Function:  Eating Eating   Modified Consistency Diet: Yes Eating Assist Level: Supervision or verbal cues;Help managing cup/glass           Cognition Comprehension Comprehension assist level: Follows basic conversation/direction with extra time/assistive device  Expression   Expression assist level: Expresses basic 75 - 89% of the time/requires cueing 10 - 24% of the time. Needs helper to occlude trach/needs to repeat words.  Social Interaction Social Interaction assist level: Interacts appropriately 50 - 74% of the time - May be physically or verbally inappropriate.  Problem Solving Problem solving assist level: Solves basic 50 - 74% of the time/requires cueing 25 - 49% of the time  Memory Memory assist level: Recognizes or recalls 50 - 74% of the time/requires cueing 25 - 49% of the time   Short Term Goals: Week 1: SLP Short Term Goal 1 (Week 1): Pt will consume regular textures and thin liquids with mod I use of swallowing precautions.   SLP Short Term Goal 2 (Week 1): Pt will selectively attend to task in a mildly distracting environment for 5 minutes with min verbal cues for redirection.   SLP Short Term Goal 3 (Week 1): Pt will visually scan to the left of midline in 75% of opportunities with  supervision verbal cues.   SLP Short Term Goal 4 (Week 1): Pt will complete semi-complex tasks with min assist verbal cues for functional problem solving.   SLP Short Term Goal 5 (Week 1): Pt will return demonstration of diaphragmatic breathing exercises to improve breath support for speech with supervision.   SLP Short Term Goal 6 (Week 1): Pt will utilize increased vocal intensity to achieve intelligibility  at the conversational level with mod I.    Refer to Care Plan for Long Term Goals  Recommendations for other services: Neuropsych  Discharge Criteria: Patient will be discharged from SLP if patient refuses treatment 3 consecutive times without medical reason, if treatment goals not met, if there is a change in medical status, if patient makes no progress towards goals or if patient is discharged from hospital.  The above assessment, treatment plan, treatment alternatives and goals were discussed and mutually agreed upon: by patient and by family  Emilio Math 07/12/2016, 5:05 PM

## 2016-07-12 NOTE — Evaluation (Signed)
Physical Therapy Assessment and Plan  Patient Details  Name: Crystal Parsons MRN: [id number] Date of Birth: 04/23/1946  PT Diagnosis: Abnormal posture, Abnormality of gait, Cognitive deficits, Difficulty walking, Hemiparesis non-dominant, Muscle weakness and Pain in head Rehab Potential: Good ELOS: 2-2.5 weeks   Today's Date: 07/12/2016 PT Individual Time: 7903-8333 PT Individual Time Calculation (min): 68 min     Problem List:  Patient Active Problem List   Diagnosis Date Noted  . Monocytosis   . Acute lower UTI   . GBM (glioblastoma multiforme) ([place])   . 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 MR (mitral regurgitation)   . Brain tumor ([place])   . Apraxia   . Left-sided weakness   . Steroid-induced hyperglycemia   . Leucocytosis   . Benign essential HTN   . History of gout   . Dysuria   . Acute respiratory failure with hypoxia ([place])   . Brain mass   . Seizure ([place]) 06/30/2016  . Status epilepticus ([place]) 06/30/2016    Past Medical History:  Past Medical History:  Diagnosis Date  . Anxiety   . Bradycardia    At times with pulse in the 40s  . Diarrhea    With blating, improved with gluten-free diet  . Diastolic dysfunction   . Essential hypertension, benign    Always has HTN when at the doctor's office.  . Gout   . Hyperlipidemia   . Lynch syndrome   . Mild aortic sclerosis ([place])   . Mitral valve problem    Mildly thickened mitral valve  . Mitral valve prolapse    Mild, anterior  . MR (mitral regurgitation)    ECHO 08/10/09 shows mild MR again and normal EF. No significant changes from prior ECHO.  Marland Kitchen Palpitations    Occasional, but are not significant. ECHO 08/20/08 - Normal EF (60%), mildly thickened mitral valve with mild anterior mitral valve prolapse, mild MR, mild aortic sclerosis, grade 1 diastolic dysfunction.  . Proteinuria    Likely secondary to Diabetes   Past Surgical History:  Past Surgical History:  Procedure Laterality Date  .  ABDOMINAL HYSTERECTOMY    . APPLICATION OF CRANIAL NAVIGATION Right 07/07/2016   Procedure: APPLICATION OF CRANIAL NAVIGATION;  Surgeon: Consuella Lose, MD;  Location: [facility];  Service: Neurosurgery;  Laterality: Right;  . BREAST LUMPECTOMY Left   . CRANIOTOMY Right 07/07/2016   Procedure: CRANIOTOMY TUMOR EXCISION w/BrainLab;  Surgeon: Consuella Lose, MD;  Location: [facility];  Service: Neurosurgery;  Laterality: Right;  . HEMORROIDECTOMY  2009  . TUBAL LIGATION      Assessment & Plan Clinical Impression: Patient is a 70 y.o.femalewith history of HTN, recent gout flare who was admitted on 07/01/16 with multiple episode of seizures and requires intubation in ED for airway protection. 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 She was extubated without difficulty and was noted to have mild LUE weakness, cognitive deficits affecting processing, attention and memory. Surgical intervention recommended by Dr. Kathyrn Sheriff and she underwent stereotactic right frontal crani for tumor resection on 12/7. Post op noted to have lethargy, nausea, apraxia as well as bradycardia affecting activity.  Patient transferred to CIR on 07/11/2016 .   Patient currently requires mod-max with mobility secondary to muscle weakness, decreased cardiorespiratoy endurance, impaired timing and sequencing, decreased coordination and decreased motor planning, decreased attention to left, decreased  initiation, decreased attention, decreased awareness, decreased problem solving and delayed processing and decreased sitting balance, decreased standing balance, decreased postural control, hemiplegia and decreased balance strategies.  Prior to hospitalization, patient was independent  with mobility and lived with Spouse in a House home.  Home access is 2 + 1 with fence post to hold on to Stairs to  enter.  Patient will benefit from skilled PT intervention to maximize safe functional mobility, minimize fall risk and decrease caregiver burden for planned discharge home with 24 hour supervision.  Anticipate patient will benefit from follow up [place] at discharge.  PT - End of Session Activity Tolerance: Decreased this session Endurance Deficit: Yes Endurance Deficit Description: deconditioned PT Assessment Rehab Potential (ACUTE/IP ONLY): Good PT Patient demonstrates impairments in the following area(s): Balance;Endurance;Motor;Pain;Perception;Safety PT Transfers Functional Problem(s): Bed Mobility;Bed to Chair;Car;Furniture PT Locomotion Functional Problem(s): Ambulation;Stairs PT Plan PT Intensity: Minimum of 1-2 x/day ,45 to 90 minutes PT Frequency: 5 out of 7 days PT Duration Estimated Length of Stay: 2-2.5 weeks PT Treatment/Interventions: Ambulation/gait training;Balance/vestibular training;Cognitive remediation/compensation;Community reintegration;Discharge planning;DME/adaptive equipment instruction;Functional mobility training;Neuromuscular re-education;Pain management;Patient/family education;Psychosocial support;Splinting/orthotics;Stair training;Therapeutic Activities;Therapeutic Exercise;UE/LE Strength taining/ROM;UE/LE Coordination activities;Visual/perceptual remediation/compensation PT Transfers Anticipated Outcome(s): Supervision PT Locomotion Anticipated Outcome(s): Supervision PT Recommendation Recommendations for Other Services: Neuropsych consult Follow Up Recommendations: Home health PT;Outpatient PT;24 hour supervision/assistance Patient destination: Home Equipment Recommended: To be determined  Skilled Therapeutic Intervention Pt received in recliner; pt with very flat affect and required extra time to process questions and to respond.  Educated pt on purpose of PT evaluation and discussed PLOF, home set up, assistance and equipment available at D/C.  Performed  assessment of LE strength, sensation and coordination.  Pt participated in assessment of transfers, gait in controlled environment, stair negotiation, simulated car transfer as documented below.  Pt returned to room and transferred back to recliner with mod A.  Discussed goals and ELOS with pt.  Pt left in recliner with all items within reach and quick release donned.    PT Evaluation Precautions/Restrictions Precautions Precautions: Fall Precaution Comments: L neglect General Chart Reviewed: Yes Response to Previous Treatment: Patient with no complaints from previous session. Family/Caregiver Present: No Vital SignsTherapy Vitals Temp: 97.6 F (36.4 C) Temp Source: Oral Pulse Rate: 72 Resp: 18 BP: (!) 134/56 Patient Position (if appropriate): Sitting Oxygen Therapy SpO2: 99 % O2 Device: Not Delivered Pain Pain Assessment Pain Assessment: No/denies pain Home Living/Prior Functioning Home Living Available Help at Discharge: Family;Available 24 hours/day Type of Home: House Home Access: Stairs to enter [employer] of Steps: 2 + 1 with fence post to hold on to  Entrance Stairs-Rails: None Home Layout: One level  Lives With: Spouse Prior Function Level of Independence: Independent with homemaking with ambulation;Independent with gait;Independent with transfers  Able to Take Stairs?: Yes Driving: Yes Vision/Perception   L inattention  Cognition  See SLP evaluation Sensation Sensation Light Touch: Appears Intact Stereognosis: Not tested Hot/Cold: Not tested Proprioception: Impaired by gross assessment Coordination Gross Motor Movements are Fluid and Coordinated: No Fine Motor Movements are Fluid and Coordinated: No Motor  Motor Motor: Hemiplegia;Abnormal postural alignment and control  Mobility Transfers Transfers: Yes Stand Pivot Transfers: 3: Mod assist;With armrests Locomotion  Ambulation Ambulation: Yes Ambulation/Gait Assistance: 3: Mod  assist Ambulation Distance (Feet): 150 Feet Assistive device: 1 person hand held assist Gait Gait: Yes Gait Pattern: Impaired Gait Pattern: Step-through pattern;Decreased step length - right;Decreased step length - left;Decreased stride length;Decreased weight shift to right;Decreased weight shift to left;Shuffle;Lateral trunk lean to  right;Decreased trunk rotation;Poor foot clearance - left;Poor foot clearance - right Stairs / Additional Locomotion Stairs: Yes Stairs Assistance: 2: Max Armed forces technical officer Details (indicate cue type and reason): Pt self selected to ascend with LLE and descend with RLE with extra time and verbal cues to attend to LUE and LLE placement and mod-max A to fully advance COG and for control during descent Stair Management Technique: Two rails;Step to pattern;Forwards Number of Stairs: 4 Height of Stairs: 5 Wheelchair Mobility Wheelchair Mobility: No  Trunk/Postural Assessment  Cervical Assessment Cervical Assessment: Exceptions to [facility] (keeps head turned to R; limited rotation ROM to L) Thoracic Assessment Thoracic Assessment: Within Functional Limits Lumbar Assessment Lumbar Assessment: Within Functional Limits Postural Control Postural Control: Deficits on evaluation (delayed responses)  Balance Balance Balance Assessed: Yes Static Sitting Balance Static Sitting - Balance Support: Right upper extremity supported;Left upper extremity supported Static Sitting - Level of Assistance: 5: Stand by assistance Dynamic Sitting Balance Dynamic Sitting - Balance Support: Right upper extremity supported;Left upper extremity supported Dynamic Sitting - Level of Assistance: 4: Min assist Static Standing Balance Static Standing - Balance Support: Left upper extremity supported Static Standing - Level of Assistance: 4: Min assist Dynamic Standing Balance Dynamic Standing - Balance Support: Left upper extremity supported Dynamic Standing - Level of Assistance:  3: Mod assist Extremity Assessment  RLE Assessment RLE Assessment: Exceptions to WFL (4+/5; poor initiation) LLE Assessment LLE Assessment: Exceptions to [facility] (4-/5 poor initiation)   See Function Navigator for Current Functional Status.   Refer to Care Plan for Long Term Goals  Recommendations for other services: Neuropsych and Therapeutic Recreation  Pet therapy, Kitchen group and Outing/community reintegration  Discharge Criteria: Patient will be discharged from PT if patient refuses treatment 3 consecutive times without medical reason, if treatment goals not met, if there is a change in medical status, if patient makes no progress towards goals or if patient is discharged from hospital.  The above assessment, treatment plan, treatment alternatives and goals were discussed and mutually agreed upon: by patient  [name] Faucette 07/12/2016, 4:31 PM

## 2016-07-12 NOTE — Evaluation (Signed)
Occupational Therapy Assessment and Plan  Patient Details  Name: Crystal Parsons MRN: [id number] Date of Birth: 04/11/1946  OT Diagnosis: abnormal posture, acute pain, cognitive deficits, disturbance of vision, hemiplegia affecting non-dominant side, muscle weakness (generalized) and coordination disorder Rehab Potential: Rehab Potential (ACUTE ONLY): Good ELOS: 14-17 days   Today's Date: 07/12/2016 OT Individual Time: 0800-0900 and 1300-1330 OT Individual Time Calculation (min): 60 min and 30 min      Problem List: Patient Active Problem List   Diagnosis Date Noted  . Monocytosis   . Acute lower UTI   . GBM (glioblastoma multiforme) ([place])   . 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 MR (mitral regurgitation)   . Brain tumor ([place])   . Apraxia   . Left-sided weakness   . Steroid-induced hyperglycemia   . Leucocytosis   . Benign essential HTN   . History of gout   . Dysuria   . Acute respiratory failure with hypoxia ([place])   . Brain mass   . Seizure ([place]) 06/30/2016  . Status epilepticus ([place]) 06/30/2016    Past Medical History:  Past Medical History:  Diagnosis Date  . Anxiety   . Bradycardia    At times with pulse in the 40s  . Diarrhea    With blating, improved with gluten-free diet  . Diastolic dysfunction   . Essential hypertension, benign    Always has HTN when at the doctor's office.  . Gout   . Hyperlipidemia   . Lynch syndrome   . Mild aortic sclerosis ([place])   . Mitral valve problem    Mildly thickened mitral valve  . Mitral valve prolapse    Mild, anterior  . MR (mitral regurgitation)    ECHO 08/10/09 shows mild MR again and normal EF. No significant changes from prior ECHO.  Marland Kitchen Palpitations    Occasional, but are not significant. ECHO 08/20/08 - Normal EF (60%), mildly thickened mitral valve with mild anterior mitral valve prolapse, mild MR, mild aortic sclerosis, grade 1 diastolic dysfunction.  . Proteinuria    Likely secondary to Diabetes    Past Surgical History:  Past Surgical History:  Procedure Laterality Date  . ABDOMINAL HYSTERECTOMY    . APPLICATION OF CRANIAL NAVIGATION Right 07/07/2016   Procedure: APPLICATION OF CRANIAL NAVIGATION;  Surgeon: Consuella Lose, MD;  Location: [place];  Service: Neurosurgery;  Laterality: Right;  . BREAST LUMPECTOMY Left   . CRANIOTOMY Right 07/07/2016   Procedure: CRANIOTOMY TUMOR EXCISION w/BrainLab;  Surgeon: Consuella Lose, MD;  Location: [place];  Service: Neurosurgery;  Laterality: Right;  . HEMORROIDECTOMY  2009  . TUBAL LIGATION      Assessment & Plan Clinical Impression: Patient is a 70 y.o. year old femalewith history of HTN, Lynch syndrome, palpitations, recent gout flare who was admitted on 07/01/16 with multiple episodes of seizures and requires intubation in ED for airway protection. 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 She was extubated without difficulty and was noted to have mild LUE weakness, cognitive deficits affecting processing, attention and memory. Surgical intervention recommended by Dr. Kathyrn Sheriff and she underwent stereotactic right frontal crani for tumor resection on 12/7.   Patient and husband deny prior DM or cardiac history. On decadron for management of edema and on keppra for seizures. She has had issues with bradycardia as well as hypoxia post op. Somnolence  resolving and patient with resultant apraxia, left sided weakness, cognitive deficits, decreased attention as well as deficits in mobility and self care tasks.  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  CIR recommended for follow up therapy.  Patient transferred to CIR on 07/11/2016 .    Patient currently requires max with basic self-care skills secondary to muscle weakness, decreased cardiorespiratoy endurance, motor apraxia, decreased coordination and  decreased motor planning, L neglect, decreased initiation, decreased attention, decreased awareness, decreased problem solving, decreased safety awareness, decreased memory and delayed processing and decreased sitting balance, decreased standing balance and decreased balance strategies.  Prior to hospitalization, patient could complete ADLs and IADLs with modified independent .  Patient will benefit from skilled intervention to increase independence with basic self-care skills prior to discharge home with care partner.  Anticipate patient will require 24 hour supervision and follow up home health.  OT - End of Session Activity Tolerance: Decreased this session Endurance Deficit: Yes Endurance Deficit Description: deconditioned OT Assessment Rehab Potential (ACUTE ONLY): Good Barriers to Discharge:  (unknown at this time) OT Patient demonstrates impairments in the following area(s): Balance;Cognition;Endurance;Motor;Pain;Safety;Vision OT Basic ADL's Functional Problem(s): Grooming;Bathing;Dressing;Toileting;Eating OT Transfers Functional Problem(s): Toilet;Tub/Shower OT Additional Impairment(s): Fuctional Use of Upper Extremity OT Plan OT Intensity: Minimum of 1-2 x/day, 45 to 90 minutes OT Frequency: 5 out of 7 days OT Duration/Estimated Length of Stay: 14-17 days OT Treatment/Interventions: Medical illustrator training;Community reintegration;Discharge planning;Functional mobility training;Psychosocial support;Therapeutic Activities;Visual/perceptual remediation/compensation;UE/LE Coordination activities;Patient/family education;Cognitive remediation/compensation;DME/adaptive equipment instruction;Pain management;UE/LE Strength taining/ROM;Wheelchair propulsion/positioning;Therapeutic Exercise;Self Care/advanced ADL retraining;Neuromuscular re-education OT Self Feeding Anticipated Outcome(s): overall supervision  OT Basic Self-Care Anticipated Outcome(s): overall supervision OT Toileting  Anticipated Outcome(s): overall supervision OT Bathroom Transfers Anticipated Outcome(s): overall supervision OT Recommendation Recommendations for Other Services: Other (comment) Patient destination: Home Follow Up Recommendations: Home health OT Equipment Recommended: To be determined   Skilled Therapeutic Intervention  Session 1: Upon entering the room, pt supine in bed with no c/o pain this session. OT educated pt on OT purpose, POC , and goals with pt verbalizing understanding and agreement. Pt sitting on EOB with min A for balance and max multimodal cues to shift weight to R to come to midline. Pt engaged in bathing and dressing tasks with cues to utilize L UE in functional tasks. At times pt would utilize L UE without cues for routine task such as applying deodorant to both armpits. Pt returning to supine to don LB clothing as she needed total A. Mod A stand pivot transfer to recliner chair. Quick release belt donned for safety. Call bell and all needed items within reach.   Session 2: Upon entering the room, pt seated in recliner chair finishing lunch. Pt with no c/o pain this session. Pt requested therapist assist her with washing hair as she had dried blood in hair. OT assisted pt with task at sink while educating pt on goals and expectations for OT intervention. Pt asking questions as needed. OT assisted pt with combing hair as well. Pt remained seated in recliner chair with quick release belt donned and call bell within reach upon exiting the room.    OT Evaluation Precautions/Restrictions  Precautions Precautions: Fall Precaution Comments: L neglect Restrictions Weight Bearing Restrictions: No General   Vital Signs  Pain Pain Assessment Pain Assessment: No/denies pain Home Living/Prior Functioning Home Living Family/patient expects to be discharged to:: Private residence Living Arrangements: Spouse/significant other Available Help at Discharge: Family, Available 24  hours/day Type of Home: House Home Access: Stairs to enter [employer]  of Steps: 2 + 1 with fence post to hold on to  Entrance Stairs-Rails: None Home Layout: One level Bathroom Shower/Tub: Multimedia programmer: Standard Bathroom Accessibility: Yes  Lives With: Spouse Prior Function Level of Independence: Independent with homemaking with ambulation, Independent with gait, Independent with transfers  Able to Take Stairs?: Yes Driving: Yes Vocation: Retired ADL   Vision/Perception  Vision- History Baseline Vision/History: Wears glasses Wears Glasses: At all times Patient Visual Report: Blurring of vision Vision- Assessment Vision Assessment?: Vision impaired- to be further tested in functional context  Cognition Overall Cognitive Status: Impaired/Different from baseline Arousal/Alertness: Awake/alert Orientation Level: Person;Place Year: 2017 Month: December Day of Week: Incorrect Memory: Impaired Memory Impairment: Decreased recall of new information Immediate Memory Recall: Sock;Blue;Bed Memory Recall: Sock (1/3) Memory Recall Sock: Without Cue Attention: Selective Selective Attention: Impaired Selective Attention Impairment: Functional basic;Verbal basic Awareness: Impaired Awareness Impairment: Intellectual impairment Problem Solving: Impaired Problem Solving Impairment: Functional basic Safety/Judgment: Impaired Comments: left inattention  Sensation Sensation Light Touch: Appears Intact Stereognosis: Not tested Hot/Cold: Not tested Proprioception: Impaired by gross assessment Coordination Gross Motor Movements are Fluid and Coordinated: No Fine Motor Movements are Fluid and Coordinated: No Motor  Motor Motor: Hemiplegia;Abnormal postural alignment and control Mobility  Transfers Transfers: Sit to Stand;Stand to Sit Sit to Stand: 3: Mod assist Stand to Sit: 3: Mod assist  Trunk/Postural Assessment  Cervical Assessment Cervical  Assessment: Exceptions to [facility] (head turned to R) Thoracic Assessment Thoracic Assessment: Within Functional Limits Lumbar Assessment Lumbar Assessment: Within Functional Limits Postural Control Postural Control: Deficits on evaluation  Balance Balance Balance Assessed: Yes Static Sitting Balance Static Sitting - Balance Support: Right upper extremity supported;Left upper extremity supported Static Sitting - Level of Assistance: 5: Stand by assistance Dynamic Sitting Balance Dynamic Sitting - Balance Support: Right upper extremity supported;Left upper extremity supported Dynamic Sitting - Level of Assistance: 4: Min Insurance risk surveyor Standing - Balance Support: Left upper extremity supported Static Standing - Level of Assistance: 4: Min assist Dynamic Standing Balance Dynamic Standing - Balance Support: Left upper extremity supported Dynamic Standing - Level of Assistance: 3: Mod assist Extremity/Trunk Assessment RUE Assessment RUE Assessment: Within Functional Limits LUE Assessment LUE Assessment: Exceptions to [facility] (unable to formally assess. neglects L UE but will utilze during functional task.)   See Function Navigator for Current Functional Status.   Refer to Care Plan for Long Term Goals  Recommendations for other services: Therapeutic Recreation  Pet therapy   Discharge Criteria: Patient will be discharged from OT if patient refuses treatment 3 consecutive times without medical reason, if treatment goals not met, if there is a change in medical status, if patient makes no progress towards goals or if patient is discharged from hospital.  The above assessment, treatment plan, treatment alternatives and goals were discussed and mutually agreed upon: by patient  Gypsy Decant 07/12/2016, 5:52 PM

## 2016-07-12 NOTE — Consult Note (Signed)
            [facility] CM Primary Care Navigator  07/12/2016  Crystal Parsons 1946/05/21 [id number]  Went to see patient at the bedside to identify possible discharge needs but patient had been discharged to [facility] ([place] per staff.  For additional questions please contact:  Edwena Felty A. Benedicto Capozzi, BSN, RN-BC [facility] PRIMARY CARE Navigator Cell: [phone]

## 2016-07-13 ENCOUNTER — Inpatient Hospital Stay (HOSPITAL_COMMUNITY): Payer: Medicare Other | Admitting: Physical Therapy

## 2016-07-13 ENCOUNTER — Inpatient Hospital Stay (HOSPITAL_COMMUNITY): Payer: Medicare Other | Admitting: Occupational Therapy

## 2016-07-13 ENCOUNTER — Inpatient Hospital Stay (HOSPITAL_COMMUNITY): Payer: Medicare Other | Admitting: Speech Pathology

## 2016-07-13 LAB — GLUCOSE, CAPILLARY
GLUCOSE-CAPILLARY: 120 mg/dL — AB (ref 65–99)
Glucose-Capillary: 97 mg/dL (ref 65–99)
Glucose-Capillary: 100 mg/dL — ABNORMAL HIGH (ref 65–99)
Glucose-Capillary: 157 mg/dL — ABNORMAL HIGH (ref 65–99)

## 2016-07-13 NOTE — Progress Notes (Signed)
Physical Therapy Session Note  Patient Details  Name: Crystal Parsons MRN: [id number] Date of Birth: 10/09/1945  Today's Date: 07/13/2016 PT Individual Time: 1300-1415  PT Individual Time Calculation (min): 75 min    Short Term Goals: Week 1:  PT Short Term Goal 1 (Week 1): Pt will perform bed mobility on flat bed with min A PT Short Term Goal 2 (Week 1): Pt will perform bed <> chair transfers stand pivot with min A  PT Short Term Goal 3 (Week 1): Pt will perform gait x 150' with min A with mod cues for attention to L environment PT Short Term Goal 4 (Week 1): Pt will perform up/down 4 stairs with 2 rails and min A with 50% cues for attention to LUE and LLE placement  Skilled Therapeutic Interventions/Progress Updates:  Pt received in recliner; pt still with flat affect and delayed response but more interactive today.  Pt requesting to use toilet.  Performed sit > stand from recliner with min A and ambulated to/from toilet with HHA and performed toileting and hand hygiene at sink with min-mod A for balance and verbal cues for attention to L and use of LUE for functional tasks.  Transitioned to w/c with shorter STF height to assist with placement of hips back in w/c and transported to gym.  Performed picking up of object on floor with LUE and mod A.  Performed gait over compliant surface x 2 reps with mod HHA with verbal cues for full foot clearance and facilitation for weight shifting and balance; pt cued to keep holding pen in LUE during gait to continue to facilitate increased awareness and attention to LUE.  Pt transitioned to NMR; see below.  Returned to room and pt transferred back to recliner with min-mod HHA.  Pt left with all items within reach.    Therapy Documentation Precautions:  Precautions Precautions: Fall Precaution Comments: L neglect Restrictions Weight Bearing Restrictions: No Vital Signs: Therapy Vitals Temp: 98.1 F (36.7 C) Temp Source: Oral Pulse Rate: 94 Resp:  17 BP: 98/65 Patient Position (if appropriate): Sitting Oxygen Therapy SpO2: 100 % O2 Device: Not Delivered Pain: Pain Assessment Pain Assessment: No/denies pain Other Treatments: Treatments Neuromuscular Facilitation: Left;Upper Extremity;Lower Extremity;Forced use;Activity to increase coordination;Activity to increase motor control;Activity to increase timing and sequencing;Activity to increase sustained activation;Activity to increase grading;Activity to increase lateral weight shifting;Activity to increase anterior-posterior weight shifting on Nustep for L sided strengthening, coordination and sustained attention and activation with bilat UE and LE x 10 minutes with LUE requiring use of hand splint due to slipping off of handle + 4 minutes with LE only.  Transitioned to gait forwards and backwards x 15' each with pt cued to tap soft cone with LLE when beside of it to facilitate increased gaze, attention to L, R lateral weight shift and full LLE clearance.     See Function Navigator for Current Functional Status.   Therapy/Group: Individual Therapy  [name] Faucette 07/13/2016, 3:12 PM

## 2016-07-13 NOTE — Progress Notes (Signed)
[place] PHYSICAL MEDICINE & REHABILITATION     PROGRESS NOTE  Subjective/Complaints:  Pt sitting up in her chair, working with therapies.  She slept well overnight, but woke up with a headache that resolved with medication.   ROS: Denies CP, SOB, N/V/D.  Objective: Vital Signs: Blood pressure 130/64, pulse 65, temperature 98.2 F (36.8 C), temperature source Oral, resp. rate 16, height 5\' 3"  (1.6 m), weight 84.5 kg (186 lb 4.6 oz), SpO2 97 %. No results found.  Recent Labs  07/12/16 0635  WBC 17.6*  HGB 13.0  HCT 38.0  PLT 506*    Recent Labs  07/12/16 0635  NA 137  K 4.0  CL 105  GLUCOSE 122*  BUN 26*  CREATININE 0.73  CALCIUM 9.9   CBG (last 3)   Recent Labs  07/12/16 1625 07/12/16 2051 07/13/16 0637  GLUCAP 115* 127* 120*    Wt Readings from Last 3 Encounters:  07/13/16 84.5 kg (186 lb 4.6 oz)  07/11/16 85 kg (187 lb 6.3 oz)  06/10/13 79.4 kg (175 lb)    Physical Exam:  BP 130/64 (BP Location: Right Arm)   Pulse 65   Temp 98.2 F (36.8 C) (Oral)   Resp 16   Ht 5\' 3"  (1.6 m)   Wt 84.5 kg (186 lb 4.6 oz)   SpO2 97%   BMI 33.00 kg/m  Constitutional: She appears well-developed and well-nourished. NAD. HENT: Normocephalic.  Eyes: EOM are normal. No discharge.  Cardiovascular: RRR.  No JVD. Respiratory: Effort normal and breath sounds normal.  GI: Soft. Bowel sounds are normal.  Musculoskeletal: She exhibits edema. She exhibits no tenderness.  Neurological: She is alert.  Left facial weakness  Motor: RUE: 4/5 throughout   LUE: 4-/5 throughout.  Apraxia LUE.  Skin: Skin is warm and dry.  Right curvilinear scalp incision clean,dry, intact with staples in place.   Psychiatric: Her speech is delayed. She is slowed. She expresses inappropriate judgment.   Assessment/Plan: 1. 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 Physiatrist is providing close  team supervision and 24 hour management of active medical problems listed below. Physiatrist and rehab team continue to assess barriers to discharge/monitor patient progress toward functional and medical goals.  Function:  Bathing Bathing position   Position: Wheelchair/chair at sink  Bathing parts Body parts bathed by patient: Right arm, Left arm, Chest, Abdomen, Front perineal area, Right upper leg, Left upper leg, Buttocks Body parts bathed by helper: Right lower leg, Left lower leg  Bathing assist Assist Level: Touching or steadying assistance(Pt > 75%)      Upper Body Dressing/Undressing Upper body dressing   What is the patient wearing?: Pull over shirt/dress     Pull over shirt/dress - Perfomed by patient: Thread/unthread right sleeve, Pull shirt over trunk Pull over shirt/dress - Perfomed by helper: Put head through opening, Thread/unthread left sleeve        Upper body assist Assist Level:  (mod A)      Lower Body Dressing/Undressing Lower body dressing   What is the patient wearing?: Pants, Shoes     Pants- Performed by patient: Thread/unthread right pants leg Pants- Performed by helper: Thread/unthread left pants leg, Pull pants up/down   Non-skid slipper socks- Performed by helper: Don/doff right sock, Don/doff left sock       Shoes - Performed by helper: Don/doff right shoe, Don/doff left shoe  Lower body assist Assist for lower body dressing: Touching or steadying assistance (Pt > 75%)      Toileting Toileting Toileting activity did not occur: No continent bowel/bladder event Toileting steps completed by patient: Adjust clothing prior to toileting, Performs perineal hygiene Toileting steps completed by helper: Adjust clothing after toileting    Toileting assist Assist level:  (mod A)   Transfers Chair/bed transfer Chair/bed transfer activity did not occur: Safety/medical concerns Chair/bed transfer method: Stand pivot, Ambulatory Chair/bed  transfer assist level: Moderate assist (Pt 50 - 74%/lift or lower) Chair/bed transfer assistive device: Armrests     Locomotion Ambulation     Max distance: 150 Assist level: Moderate assist (Pt 50 - 74%)   Wheelchair   Type: Manual Max wheelchair distance: 150 Assist Level: Total assistance (Pt < 25%)  Cognition Comprehension Comprehension assist level: Follows basic conversation/direction with extra time/assistive device  Expression Expression assist level: Expresses basic 75 - 89% of the time/requires cueing 10 - 24% of the time. Needs helper to occlude trach/needs to repeat words.  Social Interaction Social Interaction assist level: Interacts appropriately 50 - 74% of the time - May be physically or verbally inappropriate.  Problem Solving Problem solving assist level: Solves basic 50 - 74% of the time/requires cueing 25 - 49% of the time  Memory Memory assist level: Recognizes or recalls 50 - 74% of the time/requires cueing 25 - 49% of the time    Medical Problem List and Plan: 1.  Apraxia, left sided weakness, cognitive deficits, decreased attention as well as deficits in mobility and self care tasks secondary to GBM.   Cont CIR 2.  DVT Prophylaxis/Anticoagulation: Mechanical:  Antiembolism stockings, knee (TED hose) Bilateral lower extremities Sequential compression devices, below knee Bilateral lower extremities 3. Pain Management: tylenol effective.  4. Mood: LCSW to follow for evaluation and support.  5. Neuropsych: This patient is not fully capable of making decisions on her own behalf. 6. Skin/Wound Care: Monitor incision for healing. Maintain adequate nutritional and hydration status.  7. Fluids/Electrolytes/Nutrition: Monitor I/Os.  BMP within acceptable range on 12/12 8. New onset seizures: Managed by keppra.  9. Leucocytosis:   Likely reactive due to steroids.   WBCs 17.6 on 12/12  Monitor for signs of infection.  10. HTN: Monitor tid. Continue Apresoline, Cozaar  and HCTZ.   Stable 12/13  Monitor with increased activity 11. Steroid induced hyperglycemia: Hgb A1c- 5.5. Will monitor BS ac/hs and use SSI for elevated BS.   Monitor with increased activity  Relatively controlled 12/13 12. H/o gout: - Controlled on allopurinol and colchicine. 13. Acute lower UTI:   UA+, Ucx sensitivities pending  Empiric macrobid started 12/12-12/18  LOS (Days) 2 A FACE TO FACE EVALUATION WAS PERFORMED  Ankit Lorie Phenix 07/13/2016 11:26 AM

## 2016-07-13 NOTE — Progress Notes (Signed)
  Occupational Therapy Session Note  Patient Details  Name: Crystal Parsons MRN: [id number] Date of Birth: 06-25-1946  Today's Date: 07/13/2016 OT Individual Time: 0700-0813 OT Individual Time Calculation (min): 73 min     Short Term Goals: Week 1:  OT Short Term Goal 1 (Week 1): Pt will perform toilet transfer with mod A in order to decrease level of assist with functional transfers. OT Short Term Goal 2 (Week 1): Pt will utilize L UE in functional task with mod multimodal cues. OT Short Term Goal 3 (Week 1): Pt will perform UB dressing with mod A in order to increase I with self care.  OT Short Term Goal 4 (Week 1): Pt will engage in 10 minutes of functional task with less than 2 rest breaks.   Skilled Therapeutic Interventions/Progress Updates:     Upon entering the room, pt supine in bed awaiting OT arrival. Pt with 4/10 pain described as slight headache. Pt agreeable to OT intervention this session. Pt performed supine >sit with min A for trunk to EOB. Pt standing from bed with min A and increased time to initiate movement. Pt ambulated with mod A and held held into bathroom for toileting. Pt needing mod A for toileting for clothing management. Pt performed hygiene herself with balance assistance. Pt sitting in wheelchair at sink for bathing and dressing tasks. Pt needing max multimodal cues to locate items on L side as well as safety awareness for L UE. Pt requiring min A for sit >stand with increased time. Pt ambulating to chair with hand held assistance and required set up A for breakfast tray. Pt needing assistance locating all items on L side of tray. Call bell and all needed items within reach upon exiting the room.   Therapy Documentation Precautions:  Precautions Precautions: Fall Precaution Comments: L neglect Restrictions Weight Bearing Restrictions: No General:   Vital Signs: Therapy Vitals Temp: 98.2 F (36.8 C) Temp Source: Oral Pulse Rate: 65 Resp: 16 BP:  130/64 Patient Position (if appropriate): Lying Oxygen Therapy SpO2: 97 % O2 Device: Not Delivered Pain:   ADL:   Exercises:   Other Treatments:    See Function Navigator for Current Functional Status.   Therapy/Group: Individual Therapy  Gypsy Decant 07/13/2016, 8:13 AM

## 2016-07-13 NOTE — Progress Notes (Signed)
Speech Language Pathology Daily Session Note  Patient Details  Name: Crystal Parsons MRN: [id number] Date of Birth: 08-10-1945  Today's Date: 07/13/2016 SLP Individual Time: 0905-1000 SLP Individual Time Calculation (min): 55 min   Short Term Goals: Week 1: SLP Short Term Goal 1 (Week 1): Pt will consume regular textures and thin liquids with mod I use of swallowing precautions.   SLP Short Term Goal 2 (Week 1): Pt will selectively attend to task in a mildly distracting environment for 5 minutes with min verbal cues for redirection.   SLP Short Term Goal 3 (Week 1): Pt will visually scan to the left of midline in 75% of opportunities with supervision verbal cues.   SLP Short Term Goal 4 (Week 1): Pt will complete semi-complex tasks with min assist verbal cues for functional problem solving.   SLP Short Term Goal 5 (Week 1): Pt will return demonstration of diaphragmatic breathing exercises to improve breath support for speech with supervision.   SLP Short Term Goal 6 (Week 1): Pt will utilize increased vocal intensity to achieve intelligibility at the conversational level with mod I.    Skilled Therapeutic Interventions: Pt was seen for skilled ST targeting cognitive goals. SLP administered remaining portions of the CLQT for ongoing diagnostic treatment of cognition.  Pt with improved awareness of visual deficits and reported that on symbol cancellation subtest she noticed that she only located targets to the right of midline.  SLP discussed the importance of left attention and the impact of visual scanning on functional safety in the home environment.  Pt demonstrated perseveration on design generation subtests and decreased immediate recall on design memory subtests; suspect attention and processing speed impacting function. Pt also verbalized awareness of decreased visual scanning to the left of midline during maze subtests with supervision question cues.  Pt was returned to room and left in  recliner with quick release belt donned for safety.  Continue per current plan of care.     Function:  Eating Eating                 Cognition Comprehension Comprehension assist level: Follows basic conversation/direction with extra time/assistive device  Expression   Expression assist level: Expresses basic 75 - 89% of the time/requires cueing 10 - 24% of the time. Needs helper to occlude trach/needs to repeat words.  Social Interaction Social Interaction assist level: Interacts appropriately 50 - 74% of the time - May be physically or verbally inappropriate.  Problem Solving Problem solving assist level: Solves basic 50 - 74% of the time/requires cueing 25 - 49% of the time  Memory Memory assist level: Recognizes or recalls 50 - 74% of the time/requires cueing 25 - 49% of the time    Pain Pain Assessment Pain Assessment: No/denies pain  Therapy/Group: Individual Therapy  Jaiceon Collister, Selinda Orion 07/13/2016, 12:25 PM

## 2016-07-13 NOTE — Consult Note (Signed)
            [facility] Primary Care Navigator  07/13/2016  Crystal Parsons September 09, 1945 [id number]   Primary care provider's office contacted(Bonnie)to notify of patient's discharge or transfer to [name]  and anticipated need for post hospital follow-up and transition of care. Made aware to refer patient to [name] care management for care coordination needs as deemed appropriate  For additional questions please contact:  Edwena Felty A. Cherica Heiden, BSN, RN-BC [name] PRIMARY CARE Navigator Cell: [phone]

## 2016-07-14 ENCOUNTER — Inpatient Hospital Stay (HOSPITAL_COMMUNITY): Payer: Medicare Other | Admitting: Occupational Therapy

## 2016-07-14 ENCOUNTER — Inpatient Hospital Stay (HOSPITAL_COMMUNITY): Payer: Medicare Other | Admitting: Speech Pathology

## 2016-07-14 ENCOUNTER — Inpatient Hospital Stay (HOSPITAL_COMMUNITY): Payer: Medicare Other | Admitting: Physical Therapy

## 2016-07-14 LAB — GLUCOSE, CAPILLARY
GLUCOSE-CAPILLARY: 135 mg/dL — AB (ref 65–99)
Glucose-Capillary: 104 mg/dL — ABNORMAL HIGH (ref 65–99)
Glucose-Capillary: 115 mg/dL — ABNORMAL HIGH (ref 65–99)
Glucose-Capillary: 125 mg/dL — ABNORMAL HIGH (ref 65–99)

## 2016-07-14 LAB — URINE CULTURE

## 2016-07-14 MED ORDER — DEXAMETHASONE 4 MG PO TABS
4.0000 mg | ORAL_TABLET | Freq: Three times a day (TID) | ORAL | Status: DC
  Administered 2016-07-14 – 2016-07-22 (×24): 4 mg via ORAL
  Filled 2016-07-14 (×24): qty 1

## 2016-07-14 MED ORDER — LEVETIRACETAM 500 MG PO TABS
1000.0000 mg | ORAL_TABLET | Freq: Two times a day (BID) | ORAL | Status: DC
  Administered 2016-07-14 – 2016-07-22 (×16): 1000 mg via ORAL
  Filled 2016-07-14 (×16): qty 2

## 2016-07-14 NOTE — Progress Notes (Signed)
Speech Language Pathology Daily Session Note  Patient Details  Name: Crystal Parsons MRN: [id number] Date of Birth: [birth year]  Today's Date: 07/14/2016 SLP Individual Time: 0820-0905 SLP Individual Time Calculation (min): 45 min   Short Term Goals: Week 1: SLP Short Term Goal 1 (Week 1): Pt will consume regular textures and thin liquids with mod I use of swallowing precautions.   SLP Short Term Goal 2 (Week 1): Pt will selectively attend to task in a mildly distracting environment for 5 minutes with min verbal cues for redirection.   SLP Short Term Goal 3 (Week 1): Pt will visually scan to the left of midline in 75% of opportunities with supervision verbal cues.   SLP Short Term Goal 4 (Week 1): Pt will complete semi-complex tasks with min assist verbal cues for functional problem solving.   SLP Short Term Goal 5 (Week 1): Pt will return demonstration of diaphragmatic breathing exercises to improve breath support for speech with supervision.   SLP Short Term Goal 6 (Week 1): Pt will utilize increased vocal intensity to achieve intelligibility at the conversational level with mod I.    Skilled Therapeutic Interventions:  Pt was seen for skilled ST targeting goals for cognition and dysphagia.  SLP facilitated the session with a trial meal tray of regular textures to continue working towards diet progression.  Pt demonstrated pocketing with advanced textures due to left sided weakness which cleared with supervision verbal cues for liquid wash and increased time.  Recommend advancing pt's diet to regular textures and thin liquids, intermittent supervision for use of precautions.  Pt needed min verbal cues for task initiation and sequencing during meal.  She was able to selectively attend to meal in a mildly distracting environment for ~5 minute intervals with min verbal cues for redirection.  Pt was returned to room and left in recliner with call bell within reach and quick release belt donned for  safety.  Continue per current plan of care.    Function:  Eating Eating   Modified Consistency Diet: Yes Eating Assist Level: Supervision or verbal cues           Cognition Comprehension Comprehension assist level: Follows basic conversation/direction with extra time/assistive device  Expression   Expression assist level: Expresses basic 90% of the time/requires cueing < 10% of the time.  Social Interaction Social Interaction assist level: Interacts appropriately 75 - 89% of the time - Needs redirection for appropriate language or to initiate interaction.  Problem Solving Problem solving assist level: Solves basic 50 - 74% of the time/requires cueing 25 - 49% of the time  Memory Memory assist level: Recognizes or recalls 75 - 89% of the time/requires cueing 10 - 24% of the time    Pain Pain Assessment Pain Assessment: No/denies pain  Therapy/Group: Individual Therapy  Crystal Parsons, Selinda Orion 07/14/2016, 12:28 PM

## 2016-07-14 NOTE — Progress Notes (Signed)
Social Work  Social Work Assessment and Plan  Patient Details  Name: Crystal Parsons MRN: [id number] Date of Birth: [birth year]  Today's Date: 07/14/2016  Problem List:  Patient Active Problem List   Diagnosis Date Noted  . Monocytosis   . Acute lower UTI   . GBM (glioblastoma multiforme) ([place])   . 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 MR (mitral regurgitation)   . Brain tumor ([place])   . Apraxia   . Left-sided weakness   . Steroid-induced hyperglycemia   . Leucocytosis   . Benign essential HTN   . History of gout   . Dysuria   . Acute respiratory failure with hypoxia ([place])   . Brain mass   . Seizure ([place]) 06/30/2016  . Status epilepticus ([place]) 06/30/2016   Past Medical History:  Past Medical History:  Diagnosis Date  . Anxiety   . Bradycardia    At times with pulse in the 40s  . Diarrhea    With blating, improved with gluten-free diet  . Diastolic dysfunction   . Essential hypertension, benign    Always has HTN when at the doctor's office.  . Gout   . Hyperlipidemia   . Lynch syndrome   . 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 Mitral valve problem    Mildly thickened mitral valve  . Mitral valve prolapse    Mild, anterior  . MR (mitral regurgitation)    ECHO 08/10/09 shows mild MR again and normal EF. No significant changes from prior ECHO.  Marland Kitchen Palpitations    Occasional, but are not significant. ECHO 08/20/08 - Normal EF (60%), mildly thickened mitral valve with mild anterior mitral valve prolapse, mild MR, mild aortic sclerosis, grade 1 diastolic dysfunction.  . Proteinuria    Likely secondary to Diabetes   Past Surgical History:  Past Surgical History:  Procedure Laterality Date  . ABDOMINAL HYSTERECTOMY    . APPLICATION OF CRANIAL NAVIGATION Right 07/07/2016   Procedure: APPLICATION OF CRANIAL NAVIGATION;  Surgeon: Consuella Lose, MD;  Location: [facility];  Service: Neurosurgery;  Laterality: Right;  . BREAST LUMPECTOMY Left   . CRANIOTOMY Right  07/07/2016   Procedure: CRANIOTOMY TUMOR EXCISION w/BrainLab;  Surgeon: Consuella Lose, MD;  Location: [facility];  Service: Neurosurgery;  Laterality: Right;  . HEMORROIDECTOMY  2009  . TUBAL LIGATION     Social History:  reports that she has never smoked. She has never used smokeless tobacco. She reports that she drinks alcohol. She reports that she does not use drugs.  Family / Support Systems Marital Status: Married How Long?: 72 yrs Patient Roles: Spouse, Parent Spouse/Significant Other: Doloras Berto @ (H) [phone] or [phone] Children: pt and spouse have two daughters living in [place] Anticipated Caregiver: spouse Ability/Limitations of Caregiver: no limitations Caregiver Availability: 24/7 Family Dynamics: Husband at bedside and very supportive and encouraging to pt.  They exchange a lot of humor between them and he states he can provide whatever amount of assist she may need.  Social History Preferred language: English Religion: Episcopalian Cultural Background: NA Read: Yes Write: Yes Employment Status: Retired Freight forwarder Issues: None Guardian/Conservator: None - per MD, pt is not fully capable of making decisions on her own behalf - defer to spouse   Abuse/Neglect Physical Abuse: Denies Verbal Abuse: Denies Sexual Abuse: Denies Exploitation of patient/patient's resources: Denies Self-Neglect: Denies  Emotional Status Pt's affect, behavior adn adjustment status: Pt with flat affect and somewhat slowed responses, however, they appear mostly  accurate.  Husband offers minor corrections at time.  She shows a sense of humor and responds well to husband's encouragement.  She denies any significant emotional distress but will refer for neuropsychology input on coping and cognitive fuction.  She talks in very "matterof-fact" manner about her tumor and reports she will "probably take a pill" for ongoing treatment.   Recent Psychosocial Issues:  None Pyschiatric History: None Substance Abuse History: None  Patient / Family Perceptions, Expectations & Goals Pt/Family understanding of illness & functional limitations: Pt and spouse report they await MD to give the final path report so unsure the extent of GBM diagnosis they know at this time.  Good understanding of her physical and cognitive deficits/ need for CIR. Premorbid pt/family roles/activities: Pt was completely independent. Anticipated changes in roles/activities/participation: anticipate spouse will need to provide supervision Pt/family expectations/goals: "I want to be able to do what I can for myself."  [employer]: None Premorbid Home Care/DME Agencies: None Transportation available at discharge: yes Resource referrals recommended: Neuropsychology, Support group (specify)  Discharge Planning Living Arrangements: Spouse/significant other Support Systems: Spouse/significant other, Children, Water engineer, Social worker community Type of Residence: Private residence Insurance underwriter Resources: [employer], Multimedia programmer (specify) Nurse, mental health) Financial Resources: [place] Referred: No Living Expenses: Own Money Management: Spouse Does the patient have any problems obtaining your medications?: No Home Management: pt and spouse Patient/Family Preliminary Plans: Pt to d/c home with husband who is able to provide 24/7 assistance. Social Work Anticipated Follow Up Needs: HH/OP Expected length of stay: 14-17 days  Clinical Impression Very pleasant woman here following surgical removal of GBM tumor.  (It appears path still not yet confirmed/ discussed with pt and spouse?)  Husband at bedside and very encouraging and notes he is very capable of providing needed assistance.  Pt with supervision goals and appears to be clearing cognitively very well.  Will monitor for support needs.  Assist with d/c planning needs as well.  Emelia Sandoval,  Nya Monds 07/14/2016, 3:21 PM

## 2016-07-14 NOTE — Care Management Note (Signed)
Inpatient Rehabilitation Center Individual Statement of Services  Patient Name:  Crystal Parsons  Date:  07/14/2016  Welcome to the [facility].  Our goal is to provide you with an individualized program based on your diagnosis and situation, designed to meet your specific needs.  With this comprehensive rehabilitation program, you will be expected to participate in at least 3 hours of rehabilitation therapies Monday-Friday, with modified therapy programming on the weekends.  Your rehabilitation program will include the following services:  Physical Therapy (PT), Occupational Therapy (OT), Speech Therapy (ST), 24 hour per day rehabilitation nursing, Therapeutic Recreaction (TR), Neuropsychology, Case Management (Social Worker), Rehabilitation Medicine, Nutrition Services and Pharmacy Services  Weekly team conferences will be held on Wednesdays to discuss your progress.  Your Social Worker will talk with you frequently to get your input and to update you on team discussions.  Team conferences with you and your family in attendance may also be held.  Expected length of stay: 11-14 days  Overall anticipated outcome: supervision  Depending on your progress and recovery, your program may change. Your Social Worker will coordinate services and will keep you informed of any changes. Your Social Worker's name and contact numbers are listed  below.  The following services may also be recommended but are not provided by the [facility] will be made to provide these services after discharge if needed.  Arrangements include referral to agencies that provide these services.  Your insurance has been verified to be:  Medicare and [employer] Your primary doctor is:  Dr. Laurann Montana  Pertinent information will be shared with your doctor and your insurance  company.  Social Worker:  [place], [place] or ([phone]   Information discussed with and copy given to patient by: Lennart Pall, 07/14/2016, 3:24 PM

## 2016-07-14 NOTE — Progress Notes (Signed)
Social Work Patient ID: Roney Mans, female   DOB: 1946/03/13, 70 y.o.   MRN: [id number]  Lowella Curb, LCSW Social Worker Signed   Patient Care Conference Date of Service: 07/14/2016  3:28 PM      Hide copied text Hover for attribution information Inpatient RehabilitationTeam Conference and Plan of Care Update Date: 07/13/2016   Time: 2:50 PM      Patient Name: Crystal Parsons      Medical Record Number: [id number]  Date of Birth: 1946-07-19 Sex: Female         Room/Bed: 4W18C/4W18C-01 Payor Info: Payor: MEDICARE / Plan: MEDICARE PART A AND B / Product Type: *No Product type* /     Admitting Diagnosis: Tumore Rejection Crani  Admit Date/Time:  07/11/2016  4:16 PM Admission Comments: No comment available    Primary Diagnosis:  <principal problem not specified> Principal Problem: <principal problem not specified>       Patient Active Problem List    Diagnosis Date Noted  . Monocytosis    . Acute lower UTI    . GBM (glioblastoma multiforme) ([place])    . 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 MR (mitral regurgitation)    . Brain tumor ([place])    . Apraxia    . Left-sided weakness    . Steroid-induced hyperglycemia    . Leucocytosis    . Benign essential HTN    . History of gout    . Dysuria    . Acute respiratory failure with hypoxia ([place])    . Brain mass    . Seizure ([place]) 06/30/2016  . Status epilepticus ([place]) 06/30/2016      Expected Discharge Date: Expected Discharge Date: 07/24/16   Team Members Present: Physician leading conference: Dr. Delice Lesch Social Worker Present: Lennart Pall, LCSW Nurse Present: Dorthula Nettles, RN PT Present: [name], PT OT Present: Benay Pillow, OT SLP Present: Weston Anna, SLP PPS Coordinator present : Daiva Nakayama, RN, CRRN       Current Status/Progress Goal Weekly Team Focus  Medical   Apraxia, left sided weakness, cognitive deficits, decreased attention as well as deficits in mobility and self care tasks  secondary to GBM  Improve left neglect, mobility, endurance  See above   Bowel/Bladder   incontinent of bowel & bladder, LBM 07/10/16  to become continent with mod assist  scheduled toileting   Swallow/Nutrition/ Hydration   Dys 3, thin liquids   mod I   trials of regular textures for advancement    ADL's   min A bathing, mod A UB self care, Mod A toileting, mod A functional transfers, MAx A LB self care  overall supervision  self care retraining, L neglect, balance/midline orientation, and pt/family edu   Mobility   Mod A overall  Supervision overall  attention to L, sustained attention to task, L NMR, balance, gait, initiation   Communication   decreased vocal intensity, breathy vocal quality   mod I   diaphragmatic breathing exercises    Safety/Cognition/ Behavioral Observations moderate cognitive impairments   supervision-min assist   left attention, awareness of deficits, attention to task    Pain   rarely c/o pain. Has Tylenol ordered 650mg  q 6hrs prn, has not used it, also has Vicodin 5/325mg  ordered q 4hr prn, has udsed it once in the last few days  pain scale less than 4  continue to assess & treat prn   Skin   staples to scalp incision right temporal/parietal area,  some ecchymosis  no new areas of skin breakdown  continue to assess q shift     Rehab Goals Patient on target to meet rehab goals: Yes *See Care Plan and progress notes for long and short-term goals.   Barriers to Discharge: Left neglect, mobility, weakness endurance, UTI   Possible Resolutions to Barriers:  Therapies, abx   Discharge Planning/Teaching Needs:  Pt to return home with spouse who can provide 24/7 assistance.      Team Discussion:  New eval.   New GBM;  Treating UTI but labs improved.  Left inattention, neglect;  Initiation some better today.  Flat affect.  Currently min/mod assist with HHA.  Left side more an issue of inattention than weakness.  Supervision goals.  Revisions to Treatment Plan:  None      Continued Need for Acute Rehabilitation Level of Care: The patient requires daily medical management by a physician with specialized training in physical medicine and rehabilitation for the following conditions: Daily direction of a multidisciplinary physical rehabilitation program to ensure safe treatment while eliciting the highest outcome that is of practical value to the patient.: Yes Daily medical management of patient stability for increased activity during participation in an intensive rehabilitation regime.: Yes Daily analysis of laboratory values and/or radiology reports with any subsequent need for medication adjustment of medical intervention for : Post surgical problems;Other;Wound care problems   Crystal Parsons 07/14/2016, 3:28 PM

## 2016-07-14 NOTE — Progress Notes (Signed)
Social Work Patient ID: Roney Mans, female   DOB: 07-22-1946, 70 y.o.   MRN: [id number]   Have reviewed team conference yesterday afternoon with pt and spouse.  Both aware and agreeable with targeted d/c date of 12/24 and pleased with gains so far.  Husband very supportive and able to meet supervision/ assist care needs.  Continue to follow.  Aven Christen, LCSW

## 2016-07-14 NOTE — Progress Notes (Signed)
Physical Therapy Session Note  Patient Details  Name: Crystal Parsons MRN: [id number] Date of Birth: August 06, 1945  Today's Date: 07/14/2016 PT Individual Time: 0915-1000 PT Individual Time Calculation (min): 45 min    Short Term Goals: Week 1:  PT Short Term Goal 1 (Week 1): Pt will perform bed mobility on flat bed with min A PT Short Term Goal 2 (Week 1): Pt will perform bed <> chair transfers stand pivot with min A  PT Short Term Goal 3 (Week 1): Pt will perform gait x 150' with min A with mod cues for attention to L environment PT Short Term Goal 4 (Week 1): Pt will perform up/down 4 stairs with 2 rails and min A with 50% cues for attention to LUE and LLE placement  Skilled Therapeutic Interventions/Progress Updates:   Pt received seated in recliner, denies pain and agreeable to treatment. States she has headaches when she first wakes up in the morning, but it is improved currently. Pt dons B socks/shoes with modA for stabilizing LEs across opposite leg to allow for use of BUEs due to decreased LUE fine motor control and coordination. Significantly increased time required due to pt easily distracted, and distracted by her own attempt to engage in conversation with therapist. Requires cues for attention to task, as well as therapist moving to another part of the room to allow pt to focus. Gait in/out of bathroom with min guard, and pt "furniture walking". Pt dons/doffs pants with S and increased time, requires totalA for management of hospital brief; performs hygiene with setupA. Standing at sink, pt performed hand washing with S for standing balance. And seated at sink pt performed grooming and brushing teeth with setupA. Gait with min guard x175'; min cues for attention to objects on R side. Discussed "furniture walking" with patient and increased risk of falls due to possibility of furniture tipping over or rolling away from her if she leans on something; verbalizes understanding to rationale for  increased falls risk however continues to reach for nearby objects during remainder of gait trial. Remained seated in recliner at end of session, quick release belt intact and all needs in reach.  Therapy Documentation Precautions:  Precautions Precautions: Fall Precaution Comments: L neglect Restrictions Weight Bearing Restrictions: No   See Function Navigator for Current Functional Status.   Therapy/Group: Individual Therapy  Luberta Mutter 07/14/2016, 10:37 AM

## 2016-07-14 NOTE — Patient Care Conference (Signed)
Inpatient RehabilitationTeam Conference and Plan of Care Update Date: 07/13/2016   Time: 2:50 PM    Patient Name: Crystal Parsons      Medical Record Number: [id number]  Date of Birth: Dec 17, 1945 Sex: Female         Room/Bed: 4W18C/4W18C-01 Payor Info: Payor: MEDICARE / Plan: MEDICARE PART A AND B / Product Type: *No Product type* /    Admitting Diagnosis: Tumore Rejection Crani  Admit Date/Time:  07/11/2016  4:16 PM Admission Comments: No comment available   Primary Diagnosis:  <principal problem not specified> Principal Problem: <principal problem not specified>  Patient Active Problem List   Diagnosis Date Noted  . Monocytosis   . Acute lower UTI   . GBM (glioblastoma multiforme) ([facility])   . 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 MR (mitral regurgitation)   . Brain tumor ([place])   . Apraxia   . Left-sided weakness   . Steroid-induced hyperglycemia   . Leucocytosis   . Benign essential HTN   . History of gout   . Dysuria   . Acute respiratory failure with hypoxia ([place])   . Brain mass   . Seizure ([place]) 06/30/2016  . Status epilepticus ([place]) 06/30/2016    Expected Discharge Date: Expected Discharge Date: 07/24/16  Team Members Present: Physician leading conference: Dr. Delice Lesch Social Worker Present: Lennart Pall, LCSW Nurse Present: Dorthula Nettles, RN PT Present: [name], PT OT Present: Benay Pillow, OT SLP Present: Weston Anna, SLP PPS Coordinator present : Daiva Nakayama, RN, CRRN     Current Status/Progress Goal Weekly Team Focus  Medical   Apraxia, left sided weakness, cognitive deficits, decreased attention as well as deficits in mobility and self care tasks secondary to GBM  Improve left neglect, mobility, endurance  See above   Bowel/Bladder   incontinent of bowel & bladder, LBM 07/10/16  to become continent with mod assist  scheduled toileting   Swallow/Nutrition/ Hydration   Dys 3, thin liquids   mod I   trials of regular textures for  advancement    ADL's   min A bathing, mod A UB self care, Mod A toileting, mod A functional transfers, MAx A LB self care  overall supervision  self care retraining, L neglect, balance/midline orientation, and pt/family edu   Mobility   Mod A overall  Supervision overall  attention to L, sustained attention to task, L NMR, balance, gait, initiation   Communication   decreased vocal intensity, breathy vocal quality   mod I   diaphragmatic breathing exercises    Safety/Cognition/ Behavioral Observations  moderate cognitive impairments   supervision-min assist   left attention, awareness of deficits, attention to task    Pain   rarely c/o pain. Has Tylenol ordered 650mg  q 6hrs prn, has not used it, also has Vicodin 5/325mg  ordered q 4hr prn, has udsed it once in the last few days  pain scale less than 4  continue to assess & treat prn   Skin   staples to scalp incision right temporal/parietal area, some ecchymosis  no new areas of skin breakdown  continue to assess q shift    Rehab Goals Patient on target to meet rehab goals: Yes *See Care Plan and progress notes for long and short-term goals.  Barriers to Discharge: Left neglect, mobility, weakness endurance, UTI    Possible Resolutions to Barriers:  Therapies, abx    Discharge Planning/Teaching Needs:  Pt to return home with spouse who can provide 24/7 assistance.  Team Discussion:  New eval.   New GBM;  Treating UTI but labs improved.  Left inattention, neglect;  Initiation some better today.  Flat affect.  Currently min/mod assist with HHA.  Left side more an issue of inattention than weakness.  Supervision goals.  Revisions to Treatment Plan:  None   Continued Need for Acute Rehabilitation Level of Care: The patient requires daily medical management by a physician with specialized training in physical medicine and rehabilitation for the following conditions: Daily direction of a multidisciplinary physical rehabilitation  program to ensure safe treatment while eliciting the highest outcome that is of practical value to the patient.: Yes Daily medical management of patient stability for increased activity during participation in an intensive rehabilitation regime.: Yes Daily analysis of laboratory values and/or radiology reports with any subsequent need for medication adjustment of medical intervention for : Post surgical problems;Other;Wound care problems  Mieczyslaw Stamas 07/14/2016, 3:28 PM

## 2016-07-14 NOTE — Progress Notes (Signed)
[facility] PHYSICAL MEDICINE & REHABILITATION     PROGRESS NOTE  Subjective/Complaints:  Pt seen laying in bed this AM.  She states she slept very little due to the noise outside.  She does not have any complaints this AM, remains very flat.  ROS: Denies CP, SOB, N/V/D.  Objective: Vital Signs: Blood pressure 126/65, pulse 67, temperature 97.7 F (36.5 C), temperature source Oral, resp. rate 18, height 5\' 3"  (1.6 m), weight 83.5 kg (184 lb), SpO2 96 %. No results found.  Recent Labs  07/12/16 0635  WBC 17.6*  HGB 13.0  HCT 38.0  PLT 506*    Recent Labs  07/12/16 0635  NA 137  K 4.0  CL 105  GLUCOSE 122*  BUN 26*  CREATININE 0.73  CALCIUM 9.9   CBG (last 3)   Recent Labs  07/13/16 1652 07/13/16 2135 07/14/16 0659  GLUCAP 100* 157* 135*    Wt Readings from Last 3 Encounters:  07/14/16 83.5 kg (184 lb)  07/11/16 85 kg (187 lb 6.3 oz)  06/10/13 79.4 kg (175 lb)    Physical Exam:  BP 126/65 (BP Location: Left Arm)   Pulse 67   Temp 97.7 F (36.5 C) (Oral)   Resp 18   Ht 5\' 3"  (1.6 m)   Wt 83.5 kg (184 lb)   SpO2 96%   BMI 32.59 kg/m  Constitutional: She appears well-developed and well-nourished. NAD. HENT: Normocephalic.  Eyes: EOM are normal. No discharge.  Cardiovascular: RRR.  No JVD. Respiratory: Effort normal and breath sounds normal.  GI: Soft. Bowel sounds are normal.  Musculoskeletal: She exhibits edema. She exhibits no tenderness.  Neurological: She is alert.  Left facial weakness  Motor: RUE: 4/5 throughout   LUE: 4/5 throughout.  Apraxia LUE.  B/l LE: 4/5 proximal to distal  Skin: Skin is warm and dry.  Right curvilinear scalp incision clean,dry, intact with staples in place.   Psychiatric: Her speech is delayed. She is slowed. She expresses inappropriate judgment.   Assessment/Plan: 1. Functional deficits secondary to GBM which require 3+ hours per day of interdisciplinary therapy in a comprehensive inpatient rehab  setting. Physiatrist is providing close team supervision and 24 hour management of active medical problems listed below. Physiatrist and rehab team continue to assess barriers to discharge/monitor patient progress toward functional and medical goals.  Function:  Bathing Bathing position   Position: Wheelchair/chair at sink  Bathing parts Body parts bathed by patient: Right arm, Left arm, Chest, Abdomen, Front perineal area, Right upper leg, Left upper leg, Buttocks Body parts bathed by helper: Right lower leg, Left lower leg  Bathing assist Assist Level: Touching or steadying assistance(Pt > 75%)      Upper Body Dressing/Undressing Upper body dressing   What is the patient wearing?: Pull over shirt/dress     Pull over shirt/dress - Perfomed by patient: Thread/unthread right sleeve, Pull shirt over trunk Pull over shirt/dress - Perfomed by helper: Put head through opening, Thread/unthread left sleeve        Upper body assist Assist Level:  (mod A)      Lower Body Dressing/Undressing Lower body dressing   What is the patient wearing?: Pants, Shoes     Pants- Performed by patient: Thread/unthread right pants leg Pants- Performed by helper: Thread/unthread left pants leg, Pull pants up/down   Non-skid slipper socks- Performed by helper: Don/doff right sock, Don/doff left sock       Shoes - Performed by helper: Don/doff right shoe, Don/doff left  shoe          Lower body assist Assist for lower body dressing: Touching or steadying assistance (Pt > 75%)      Toileting Toileting Toileting activity did not occur: No continent bowel/bladder event Toileting steps completed by patient: Performs perineal hygiene, Adjust clothing prior to toileting Toileting steps completed by helper: Adjust clothing after toileting Toileting Assistive Devices: Grab bar or rail  Toileting assist Assist level: Touching or steadying assistance (Pt.75%)   Transfers Chair/bed transfer Chair/bed  transfer activity did not occur: Safety/medical concerns Chair/bed transfer method: Stand pivot, Ambulatory Chair/bed transfer assist level: Moderate assist (Pt 50 - 74%/lift or lower) Chair/bed transfer assistive device: Armrests     Locomotion Ambulation     Max distance: 150 Assist level: Moderate assist (Pt 50 - 74%)   Wheelchair   Type: Manual Max wheelchair distance: 150 Assist Level: Total assistance (Pt < 25%)  Cognition Comprehension Comprehension assist level: Follows basic conversation/direction with extra time/assistive device  Expression Expression assist level: Expresses basic 75 - 89% of the time/requires cueing 10 - 24% of the time. Needs helper to occlude trach/needs to repeat words.  Social Interaction Social Interaction assist level: Interacts appropriately 50 - 74% of the time - May be physically or verbally inappropriate.  Problem Solving Problem solving assist level: Solves basic 50 - 74% of the time/requires cueing 25 - 49% of the time  Memory Memory assist level: Recognizes or recalls 50 - 74% of the time/requires cueing 25 - 49% of the time    Medical Problem List and Plan: 1.  Apraxia, left sided weakness, cognitive deficits, decreased attention as well as deficits in mobility and self care tasks secondary to GBM.   Cont CIR 2.  DVT Prophylaxis/Anticoagulation: Mechanical:  Antiembolism stockings, knee (TED hose) Bilateral lower extremities Sequential compression devices, below knee Bilateral lower extremities 3. Pain Management: tylenol effective.  4. Mood: LCSW to follow for evaluation and support.  5. Neuropsych: This patient is not fully capable of making decisions on her own behalf. 6. Skin/Wound Care: Monitor incision for healing. Maintain adequate nutritional and hydration status.  7. Fluids/Electrolytes/Nutrition: Monitor I/Os.  BMP within acceptable range on 12/12 8. New onset seizures: Managed by keppra.  9. Leucocytosis:   Likely reactive due  to steroids.   WBCs 17.6 on 12/12  Monitor for signs of infection.  10. HTN: Monitor tid. Continue Apresoline, Cozaar and HCTZ.   Stable 12/14  Monitor with increased activity 11. Steroid induced hyperglycemia: Hgb A1c- 5.5. Will monitor BS ac/hs and use SSI for elevated BS.   Monitor with increased activity  Relatively controlled 12/14 12. H/o gout: - Controlled on allopurinol and colchicine. 13. Acute lower UTI:   UA+, Ucx sensitivities pending  Empiric macrobid started 12/12-12/18  LOS (Days) 3 A FACE TO FACE EVALUATION WAS PERFORMED  Crystal Parsons Lorie Phenix 07/14/2016 9:10 AM

## 2016-07-14 NOTE — Progress Notes (Signed)
Occupational Therapy Session Note  Patient Details  Name: Crystal Parsons MRN: [id number] Date of Birth: [birth year]  Today's Date: 07/14/2016 OT Individual Time: 1100-1200 OT Individual Time Calculation (min): 60 min     Short Term Goals:Week 1:  OT Short Term Goal 1 (Week 1): Pt will perform toilet transfer with mod A in order to decrease level of assist with functional transfers. OT Short Term Goal 2 (Week 1): Pt will utilize L UE in functional task with mod multimodal cues. OT Short Term Goal 3 (Week 1): Pt will perform UB dressing with mod A in order to increase I with self care.  OT Short Term Goal 4 (Week 1): Pt will engage in 10 minutes of functional task with less than 2 rest breaks.   Skilled Therapeutic Interventions/Progress Updates:    Pt seen for OT session focusing on IADL re-training, functional ambulation/ activity tolerance and cognitive goals. Pt in recliner upon arrival, attempting to use room phone to call daughter. Pt able to write down phone number for her daughter, however, does not correctly enter in numbers to phone. Assist provided, however, busy tone only when dialing number pt provided.  She ambulated throughout session with hand held assist. VCs provided to increase step rate, using number cadence/ rhythm to maintain pt's attention to task. She was able to maintain this ~10 seconds before she stopping counting and returned to slow ambulation rate. In therapy kitchen, pt assisted with baking cookies in prep for pt holiday party. She gathered items from refrigerator and stood at stove to place dough on cookie sheet. Pt requiring max- total cuing for cognitive aspect of matching pattern/ row established by therapist when placing items on cookie sheet. Pt able to initiate need for rest break during session. Min-mod VCs required for problem solving timing of taking cookies from oven.  She returned to room in w/c for time/ energy conservation.  Pt left seated in w/c at  end of session, QRB donned, and all needs in reach.   Therapy Documentation Precautions:  Precautions Precautions: Fall Precaution Comments: L neglect Restrictions Weight Bearing Restrictions: No Pain:   No/ denies pain  See Function Navigator for Current Functional Status.   Therapy/Group: Individual Therapy  Lewis, Prescilla Monger C 07/14/2016, 7:22 AM

## 2016-07-14 NOTE — Progress Notes (Signed)
Occupational Therapy Session Note  Patient Details  Name: Crystal Parsons MRN: [id number] Date of Birth: 04/21/1946  Today's Date: 07/14/2016 OT Individual Time: [id number] OT Individual Time Calculation (min): 30 min     Short Term Goals: Week 1:  OT Short Term Goal 1 (Week 1): Pt will perform toilet transfer with mod A in order to decrease level of assist with functional transfers. OT Short Term Goal 2 (Week 1): Pt will utilize L UE in functional task with mod multimodal cues. OT Short Term Goal 3 (Week 1): Pt will perform UB dressing with mod A in order to increase I with self care.  OT Short Term Goal 4 (Week 1): Pt will engage in 10 minutes of functional task with less than 2 rest breaks.   Skilled Therapeutic Interventions/Progress Updates:   1:1 Pt asleep in bed when arrived. Focus on functional stand pivot transfers and functional ambulation without AD, standing balance, visual attention to the left and LB dressing. Pt came to EOB with min A with more than reasonable amt of time. Pt donned LB clothing with mod A (A for pulling up pants). Pt performed toileting also with mod A for clothing management. Pt ambulated to dayroom with min  A with HHA for balance and min to mod VC for attention left. Pt participated in pipe tree activity with setup of appropriate pieces and extra time (for simple field goal picture). Left pt with next therapist.   Therapy Documentation Precautions:  Precautions Precautions: Fall Precaution Comments: L neglect Restrictions Weight Bearing Restrictions: No Pain: Pain Assessment Pain Assessment: No/denies pain  See Function Navigator for Current Functional Status.   Therapy/Group: Individual Therapy  Willeen Cass [facility] 07/14/2016, 3:01 PM

## 2016-07-14 NOTE — Progress Notes (Signed)
Speech Language Pathology Daily Session Note  Patient Details  Name: Crystal Parsons MRN: [id number] Date of Birth: 22-Aug-1945  Today's Date: 07/14/2016 SLP Individual Time: 1400-1440 SLP Individual Time Calculation (min): 40 min   Short Term Goals: Week 1: SLP Short Term Goal 1 (Week 1): Pt will consume regular textures and thin liquids with mod I use of swallowing precautions.   SLP Short Term Goal 2 (Week 1): Pt will selectively attend to task in a mildly distracting environment for 5 minutes with min verbal cues for redirection.   SLP Short Term Goal 3 (Week 1): Pt will visually scan to the left of midline in 75% of opportunities with supervision verbal cues.   SLP Short Term Goal 4 (Week 1): Pt will complete semi-complex tasks with min assist verbal cues for functional problem solving.   SLP Short Term Goal 5 (Week 1): Pt will return demonstration of diaphragmatic breathing exercises to improve breath support for speech with supervision.   SLP Short Term Goal 6 (Week 1): Pt will utilize increased vocal intensity to achieve intelligibility at the conversational level with mod I.    Skilled Therapeutic Interventions: Skilled treatment session focused on cognitive goals. SLP facilitated session by providing extra time and supervision verbal cues for patient to attend to left field of environment and Max A verbal and question cues for patient to utilize her LUE during functional tasks. Patient participated in a card making task with Max A verbal cues needed to demonstrate selective attention for ~2 minutes in a moderately distracting enviornment. Patient also required supervision verbal cues for use of an increased vocal intensity at the sentence level to achieve 100% intelligibility in a moderately noisy environment. Patient ambulated back to her room with HHA and extra time. Patient left upright in recliner with quick release belt in place, all needs within reach and visitor present. Continue  with current plan of care.   Function:  Eating Eating   Modified Consistency Diet: Yes Eating Assist Level: Supervision or verbal cues           Cognition Comprehension Comprehension assist level: Follows basic conversation/direction with extra time/assistive device  Expression   Expression assist level: Expresses basic 90% of the time/requires cueing < 10% of the time.  Social Interaction Social Interaction assist level: Interacts appropriately 75 - 89% of the time - Needs redirection for appropriate language or to initiate interaction.  Problem Solving Problem solving assist level: Solves basic 50 - 74% of the time/requires cueing 25 - 49% of the time  Memory Memory assist level: Recognizes or recalls 75 - 89% of the time/requires cueing 10 - 24% of the time    Pain Pain Assessment Pain Assessment: No/denies pain  Therapy/Group: Individual Therapy  Daiden Coltrane 07/14/2016, 2:57 PM

## 2016-07-15 ENCOUNTER — Inpatient Hospital Stay (HOSPITAL_COMMUNITY): Payer: Medicare Other | Admitting: Speech Pathology

## 2016-07-15 ENCOUNTER — Inpatient Hospital Stay (HOSPITAL_COMMUNITY): Payer: Medicare Other | Admitting: Occupational Therapy

## 2016-07-15 ENCOUNTER — Inpatient Hospital Stay (HOSPITAL_COMMUNITY): Payer: Medicare Other

## 2016-07-15 ENCOUNTER — Inpatient Hospital Stay (HOSPITAL_COMMUNITY): Payer: Medicare Other | Admitting: Physical Therapy

## 2016-07-15 DIAGNOSIS — D72829 Elevated white blood cell count, unspecified: Secondary | ICD-10-CM

## 2016-07-15 LAB — GLUCOSE, CAPILLARY
GLUCOSE-CAPILLARY: 187 mg/dL — AB (ref 65–99)
GLUCOSE-CAPILLARY: 110 mg/dL — AB (ref 65–99)
Glucose-Capillary: 121 mg/dL — ABNORMAL HIGH (ref 65–99)
Glucose-Capillary: 134 mg/dL — ABNORMAL HIGH (ref 65–99)
Glucose-Capillary: 147 mg/dL — ABNORMAL HIGH (ref 65–99)

## 2016-07-15 MED ORDER — SENNOSIDES-DOCUSATE SODIUM 8.6-50 MG PO TABS
2.0000 | ORAL_TABLET | Freq: Every day | ORAL | Status: DC
  Administered 2016-07-15 – 2016-07-17 (×3): 2 via ORAL
  Filled 2016-07-15 (×3): qty 2

## 2016-07-15 MED ORDER — POLYETHYLENE GLYCOL 3350 17 G PO PACK
17.0000 g | PACK | Freq: Two times a day (BID) | ORAL | Status: AC
  Administered 2016-07-15 (×2): 17 g via ORAL
  Filled 2016-07-15 (×2): qty 1

## 2016-07-15 NOTE — Progress Notes (Signed)
[facility]     PROGRESS NOTE  Subjective/Complaints:  Pt sitting up in her chair this AM.  She is much more alert and interactive today, even joking.  She asks if she can have a glass of wine today.    ROS: Denies CP, SOB, N/V/D.  Objective: Vital Signs: Blood pressure (!) 122/54, pulse 61, temperature 97.7 F (36.5 C), temperature source Oral, resp. rate 18, height 5\' 3"  (1.6 m), weight 84.5 kg (186 lb 4.6 oz), SpO2 98 %. No results found. No results for input(s): WBC, HGB, HCT, PLT in the last 72 hours. No results for input(s): NA, K, CL, GLUCOSE, BUN, CREATININE, CALCIUM in the last 72 hours.  Invalid input(s): CO CBG (last 3)   Recent Labs  07/14/16 1646 07/14/16 2059 07/15/16 0634  GLUCAP 125* 187* 121*    Wt Readings from Last 3 Encounters:  07/15/16 84.5 kg (186 lb 4.6 oz)  07/11/16 85 kg (187 lb 6.3 oz)  06/10/13 79.4 kg (175 lb)    Physical Exam:  BP (!) 122/54 (BP Location: Left Arm)   Pulse 61   Temp 97.7 F (36.5 C) (Oral)   Resp 18   Ht 5\' 3"  (1.6 m)   Wt 84.5 kg (186 lb 4.6 oz)   SpO2 98%   BMI 33.00 kg/m  Constitutional: She appears well-developed and well-nourished. NAD. HENT: Normocephalic.  Eyes: EOM are normal. No discharge.  Cardiovascular: RRR.  No JVD. Respiratory: Effort normal and breath sounds normal.  GI: Soft. Bowel sounds are normal.  Musculoskeletal: She exhibits edema. She exhibits no tenderness.  Neurological: She is alert.  Left facial weakness  Motor: RUE: 4/5 throughout   LUE: 4/5 throughout.  Apraxia LUE. (unchanged) B/l LE: 4/5 proximal to distal (unchanged) Skin: Skin is warm and dry.  Right curvilinear scalp incision clean,dry, intact with staples in place.   Psychiatric: Her speech is delayed. She is slowed. She expresses inappropriate judgment (all appear to be improving).   Assessment/Plan: 1. 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 Physiatrist is providing close team supervision and 24 hour management of active medical problems listed below. Physiatrist and rehab team continue to assess barriers to discharge/monitor patient progress toward functional and medical goals.  Function:  Bathing Bathing position   Position: Wheelchair/chair at sink  Bathing parts Body parts bathed by patient: Right arm, Left arm, Chest, Abdomen, Front perineal area, Right upper leg, Left upper leg, Buttocks Body parts bathed by helper: Right lower leg, Left lower leg  Bathing assist Assist Level: Touching or steadying assistance(Pt > 75%)      Upper Body Dressing/Undressing Upper body dressing   What is the patient wearing?: Pull over shirt/dress     Pull over shirt/dress - Perfomed by patient: Thread/unthread right sleeve, Pull shirt over trunk Pull over shirt/dress - Perfomed by helper: Put head through opening, Thread/unthread left sleeve        Upper body assist Assist Level:  (mod A)      Lower Body Dressing/Undressing Lower body dressing   What is the patient wearing?: Pants, Shoes     Pants- Performed by patient: Thread/unthread right pants leg, Thread/unthread left pants leg Pants- Performed by helper: Pull pants up/down   Non-skid slipper socks- Performed by helper: Don/doff right sock, Don/doff left sock       Shoes - Performed by helper: Don/doff right shoe, Don/doff left shoe  Lower body assist Assist for lower body dressing: Touching or steadying assistance (Pt > 75%)      Toileting Toileting Toileting activity did not occur: No continent bowel/bladder event Toileting steps completed by patient: Performs perineal hygiene, Adjust clothing after toileting Toileting steps completed by helper: Adjust clothing prior to toileting Toileting Assistive Devices: Grab bar or rail  Toileting assist Assist level: Touching or steadying assistance (Pt.75%)    Transfers Chair/bed transfer Chair/bed transfer activity did not occur: Safety/medical concerns Chair/bed transfer method: Stand pivot, Ambulatory Chair/bed transfer assist level: Touching or steadying assistance (Pt > 75%) Chair/bed transfer assistive device: Armrests     Locomotion Ambulation     Max distance: 175 Assist level: Touching or steadying assistance (Pt > 75%)   Wheelchair   Type: Manual Max wheelchair distance: 150 Assist Level: Total assistance (Pt < 25%)  Cognition Comprehension Comprehension assist level: Follows basic conversation/direction with extra time/assistive device  Expression Expression assist level: Expresses basic 90% of the time/requires cueing < 10% of the time.  Social Interaction Social Interaction assist level: Interacts appropriately 75 - 89% of the time - Needs redirection for appropriate language or to initiate interaction.  Problem Solving Problem solving assist level: Solves basic 50 - 74% of the time/requires cueing 25 - 49% of the time  Memory Memory assist level: Recognizes or recalls 75 - 89% of the time/requires cueing 10 - 24% of the time    Medical Problem List and Plan: 1.  Apraxia, left sided weakness, cognitive deficits, decreased attention as well as deficits in mobility and self care tasks secondary to GBM.   Cont CIR 2.  DVT Prophylaxis/Anticoagulation: Mechanical:  Antiembolism stockings, knee (TED hose) Bilateral lower extremities Sequential compression devices, below knee Bilateral lower extremities 3. Pain Management: tylenol effective.  4. Mood: LCSW to follow for evaluation and support.  5. Neuropsych: This patient is not fully capable of making decisions on her own behalf. 6. Skin/Wound Care: Monitor incision for healing. Maintain adequate nutritional and hydration status.  7. Fluids/Electrolytes/Nutrition: Monitor I/Os.  BMP within acceptable range on 12/12  Labs ordered for Monday. 8. New onset seizures: Managed by  keppra.  9. Leucocytosis:   Likely reactive due to steroids.   WBCs 17.6 on 12/12  Monitor for signs of infection.   Labs ordered for Monday. 10. HTN: Monitor tid. Continue Apresoline, Cozaar and HCTZ.   Stable 12/14  Monitor with increased activity 11. Steroid induced hyperglycemia: Hgb A1c- 5.5. Will monitor BS ac/hs and use SSI for elevated BS.   Monitor with increased activity  Labile on 12/15 12. H/o gout: - Controlled on allopurinol and colchicine. 13. Acute lower UTI:   UA+, Ucx sensitivities pending  Empiric macrobid started 12/12-12/18  LOS (Days) 4 A FACE TO FACE EVALUATION WAS PERFORMED  Mitchelle Sultan Lorie Phenix 07/15/2016 11:41 AM

## 2016-07-15 NOTE — Progress Notes (Signed)
Speech Language Pathology Daily Session Note  Patient Details  Name: Crystal Parsons MRN: [id number] Date of Birth: 1945-08-04  Today's Date: 07/15/2016 SLP Individual Time: 1305-1330 SLP Individual Time Calculation (min): 25 min   Short Term Goals: Week 1: SLP Short Term Goal 1 (Week 1): Pt will consume regular textures and thin liquids with mod I use of swallowing precautions.   SLP Short Term Goal 2 (Week 1): Pt will selectively attend to task in a mildly distracting environment for 5 minutes with min verbal cues for redirection.   SLP Short Term Goal 3 (Week 1): Pt will visually scan to the left of midline in 75% of opportunities with supervision verbal cues.   SLP Short Term Goal 4 (Week 1): Pt will complete semi-complex tasks with min assist verbal cues for functional problem solving.   SLP Short Term Goal 5 (Week 1): Pt will return demonstration of diaphragmatic breathing exercises to improve breath support for speech with supervision.   SLP Short Term Goal 6 (Week 1): Pt will utilize increased vocal intensity to achieve intelligibility at the conversational level with mod I.    Skilled Therapeutic Interventions:  Pt was seen for skilled ST targeting cognitive goals.  SLP facilitated the session with a novel card game to address goals for functional problem solving.  Pt required up to mod assist verbal cues to plan and execute a problem solving strategy during the abovementioned task due to decreased mental flexibility and decreased working memory of task rules and procedures.  Pt was noted to become intermittently distracted by both internal and external stimuli; however, she was able to redirect herself to task with extra time and distant supervision cues.  Pt reported excellent toleration of diet upgrade and reported having a hot dog with cole slaw and chili for lunch without difficulty.  Daughter was present to verify.  Pt handed off to PT at the end of today's therapy session. Continue  per current plan of care.   Function:  Eating Eating   Modified Consistency Diet: Yes Eating Assist Level: Supervision or verbal cues   Eating Set Up Assist For: Opening containers       Cognition Comprehension Comprehension assist level: Follows basic conversation/direction with extra time/assistive device  Expression   Expression assist level: Expresses basic needs/ideas: With extra time/assistive device  Social Interaction Social Interaction assist level: Interacts appropriately 75 - 89% of the time - Needs redirection for appropriate language or to initiate interaction.  Problem Solving Problem solving assist level: Solves basic 50 - 74% of the time/requires cueing 25 - 49% of the time  Memory Memory assist level: Recognizes or recalls 75 - 89% of the time/requires cueing 10 - 24% of the time    Pain Pain Assessment Pain Assessment: No/denies pain  Therapy/Group: Individual Therapy  Lucilia Yanni, Selinda Orion 07/15/2016, 3:38 PM

## 2016-07-15 NOTE — Progress Notes (Signed)
Physical Therapy Session Note  Patient Details  Name: CANDELA DESAULNIERS MRN: [id number] Date of Birth: [birth year]  Today's Date: 07/15/2016 PT Individual Time: 1330-1400 PT Individual Time Calculation (min): 30 min    Short Term Goals: Week 1:  PT Short Term Goal 1 (Week 1): Pt will perform bed mobility on flat bed with min A PT Short Term Goal 2 (Week 1): Pt will perform bed <> chair transfers stand pivot with min A  PT Short Term Goal 3 (Week 1): Pt will perform gait x 150' with min A with mod cues for attention to L environment PT Short Term Goal 4 (Week 1): Pt will perform up/down 4 stairs with 2 rails and min A with 50% cues for attention to LUE and LLE placement  Skilled Therapeutic Interventions/Progress Updates:    Session focused on functional gait on unit while addressing overall L attention, initiation, and awareness to obstacles with min assist and verbal cues. Neuro re-ed on foam for compliant surface balance re-training while performing cognitive puzzle task for a word scramble game from holiday party with overall min assist. Pt requires extra time for processing throughout session. Husband in room upon end of session and reviewed weekend schedule with him per his questions. Safety belt donned and pt set up in recliner.   Therapy Documentation Precautions:  Precautions Precautions: Fall Precaution Comments: L neglect Restrictions Weight Bearing Restrictions: No  Pain:  Denies pain.    See Function Navigator for Current Functional Status.   Therapy/Group: Individual Therapy  Canary Brim Ivory Broad, PT, DPT  07/15/2016, 3:29 PM

## 2016-07-15 NOTE — Progress Notes (Signed)
Physical Therapy Session Note  Patient Details  Name: Crystal Parsons MRN: [id number] Date of Birth: 1945-12-06  Today's Date: 07/15/2016 PT Individual Time: 1051-1204 PT Individual Time Calculation (min): 73 min    Short Term Goals: Week 1:  PT Short Term Goal 1 (Week 1): Pt will perform bed mobility on flat bed with min A PT Short Term Goal 2 (Week 1): Pt will perform bed <> chair transfers stand pivot with min A  PT Short Term Goal 3 (Week 1): Pt will perform gait x 150' with min A with mod cues for attention to L environment PT Short Term Goal 4 (Week 1): Pt will perform up/down 4 stairs with 2 rails and min A with 50% cues for attention to LUE and LLE placement  Skilled Therapeutic Interventions/Progress Updates:  Pt received in recliner working on knitting; pt voice noted to be stronger and pt continues to be more interactive but still very flat affect.  Performed sit > stand and ambulated to gym without AD or UE support but still with min A due to decreased attention to L environment/obstacles and imbalance as pt fatigues; pt noted to have improved step initiation, step length and foot clearance today with increasing gait speed.  In gym performed BERG balance assessment; see below.  During balance assessment pt reporting need to urinate.  Ambulated to bathroom and performed toileting with supervision-min A and hand hygiene at sink with pt demonstrating increased initiation, sequencing and use of LUE for functional tasks.  Discussed falls risk and areas of impairments with pt.  Performed NMR: see below for details.  Returned to room ambulating with min A and returned to recliner where pt set up for lunch with all items within reach.   Therapy Documentation Precautions:  Precautions Precautions: Fall Precaution Comments: L neglect Restrictions Weight Bearing Restrictions: No  Balance: Standardized Balance Assessment Standardized Balance Assessment: Berg Balance Test Berg Balance  Test Sit to Stand: Able to stand without using hands and stabilize independently Standing Unsupported: Able to stand safely 2 minutes Sitting with Back Unsupported but Feet Supported on Floor or Stool: Able to sit safely and securely 2 minutes Stand to Sit: Sits safely with minimal use of hands Transfers: Able to transfer safely, minor use of hands Standing Unsupported with Eyes Closed: Able to stand 10 seconds with supervision Standing Ubsupported with Feet Together: Able to place feet together independently and stand for 1 minute with supervision From Standing, Reach Forward with Outstretched Arm: Can reach forward >12 cm safely (5") From Standing Position, Pick up Object from Floor: Able to pick up shoe, needs supervision From Standing Position, Turn to Look Behind Over each Shoulder: Looks behind from both sides and weight shifts well Turn 360 Degrees: Needs close supervision or verbal cueing Standing Unsupported, Alternately Place Feet on Step/Stool: Able to stand independently and safely and complete 8 steps in 20 seconds Standing Unsupported, One Foot in Front: Able to take small step independently and hold 30 seconds Standing on One Leg: Tries to lift leg/unable to hold 3 seconds but remains standing independently Total Score: 44 Other Treatments: Treatments Neuromuscular Facilitation: Left;Upper Extremity;Lower Extremity;Forced use;Activity to increase motor control;Activity to increase timing and sequencing;Activity to increase sustained activation;Activity to increase grading;Activity to increase lateral weight shifting;Activity to increase anterior-posterior weight shifting during gait x 200' while holding foam with tennis ball on it to facilitate increased attention to and use of LUE, dual tasking and selective attention to task, after first 100' and as pt  fatigued pt began to require increased verbal cues to attend to task and environment.  Performed stair negotiation training up/down  8 stairs without UE support to facilitate increased weight shifting and LE clearance, advancement and activation with mod A.   See Function Navigator for Current Functional Status.   Therapy/Group: Individual Therapy  [name] [facility] 07/15/2016, 12:21 PM

## 2016-07-15 NOTE — Progress Notes (Signed)
Occupational Therapy Session Note  Patient Details  Name: Crystal Parsons MRN: [id number] Date of Birth: 1945-10-17  Today's Date: 07/15/2016 OT Individual Time: 0758-0900 OT Individual Time Calculation (min): 62 min    Short Term Goals: Week 1:  OT Short Term Goal 1 (Week 1): Pt will perform toilet transfer with mod A in order to decrease level of assist with functional transfers. OT Short Term Goal 2 (Week 1): Pt will utilize L UE in functional task with mod multimodal cues. OT Short Term Goal 3 (Week 1): Pt will perform UB dressing with mod A in order to increase I with self care.  OT Short Term Goal 4 (Week 1): Pt will engage in 10 minutes of functional task with less than 2 rest breaks.   Skilled Therapeutic Interventions/Progress Updates:   Skilled OT session completed with focus on L UE NMR, left neglect, and midline orientation during BADL completion. Pt was sitting in recliner at time of arrival, finishing up breakfast. Pt was agreeable to complete ADLs. Sit<stand completed with supervision and pt ambulated with HHA to gather ADL items in drawers with min cues for attending to left side and integrating L UE. Pt completed bathing/dressing w/c level at sink with overall steady assist and extra time, including tying shoe laces. All items placed on left side to promote visual scanning. Mod cues for sequencing and problem solving with pt correcting shirt after placing it on backwards. Oral care completed in standing with L UE used as stabilizer. At end of tx pt ambulated to recliner in manner as written above. Pt left with safety belt donned, knitting items and all needs within reach.   Therapy Documentation Precautions:  Precautions Precautions: Fall Precaution Comments: L neglect Restrictions Weight Bearing Restrictions: No :  Pain: No c/o pain during session    ADL:   See Function Navigator for Current Functional Status.   Therapy/Group: Individual Therapy  Viktoria Gruetzmacher A  Jaaliyah Lucatero 07/15/2016, 12:37 PM

## 2016-07-16 ENCOUNTER — Inpatient Hospital Stay (HOSPITAL_COMMUNITY): Payer: Medicare Other

## 2016-07-16 ENCOUNTER — Inpatient Hospital Stay (HOSPITAL_COMMUNITY): Payer: Medicare Other | Admitting: Occupational Therapy

## 2016-07-16 ENCOUNTER — Inpatient Hospital Stay (HOSPITAL_COMMUNITY): Payer: Medicare Other | Admitting: Speech Pathology

## 2016-07-16 ENCOUNTER — Inpatient Hospital Stay (HOSPITAL_COMMUNITY): Payer: Medicare Other | Admitting: Physical Therapy

## 2016-07-16 LAB — GLUCOSE, CAPILLARY
GLUCOSE-CAPILLARY: 111 mg/dL — AB (ref 65–99)
GLUCOSE-CAPILLARY: 101 mg/dL — AB (ref 65–99)
Glucose-Capillary: 122 mg/dL — ABNORMAL HIGH (ref 65–99)
Glucose-Capillary: 133 mg/dL — ABNORMAL HIGH (ref 65–99)

## 2016-07-16 NOTE — Progress Notes (Signed)
Physical Therapy Session Note  Patient Details  Name: Crystal Parsons MRN: [id number] Date of Birth: [birth year]  Today's Date: 07/16/2016 PT Individual Time: 1013-1107 PT Individual Time Calculation (min): 54 min    Short Term Goals: Week 1:  PT Short Term Goal 1 (Week 1): Pt will perform bed mobility on flat bed with min A PT Short Term Goal 2 (Week 1): Pt will perform bed <> chair transfers stand pivot with min A  PT Short Term Goal 3 (Week 1): Pt will perform gait x 150' with min A with mod cues for attention to L environment PT Short Term Goal 4 (Week 1): Pt will perform up/down 4 stairs with 2 rails and min A with 50% cues for attention to LUE and LLE placement  Skilled Therapeutic Interventions/Progress Updates:  Pt received in recliner with family present this am; pt with make up and lipstick on this am!  Pt performed sit > stand with supervision and ambulation to BI gym with min A with use of visual cuing of green diamonds in floor tiles as feedback for maintaining midline in hallway to avoid running into objects.  Pt performed Dynavision x 3 reps Protocol A in standing with feet together initially with bilat UE reaching transitioning to using LUE only to incorporate rotation across midline with supervision-min A; pt demonstrated fastest reaction time in upper R quadrant and slowest in lower L quadrant.  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  Pt does report changes in L peripheral vision since surgery.  Discussed how pt's ability to initiate and compensate has been impaired since surgery.  Transitioned to gym and continued stair negotiation training to simulate home entry/exit with pt negotiating up/down 4 larger steps with RUE support on rail to ascend, LUE to descend with R HHA for balance; daughter reports that to negotiate steps to porch there is a white picket fence on R side that pt can use for support.  Continued dual task, attention to L and  coordination training with gait x 75' x 2 while passing/tossing tennis ball between bilat UE.  At end of session pt returned to room and to sit in recliner with family present to supervise.    Therapy Documentation Precautions:  Precautions Precautions: Fall Precaution Comments: L neglect Restrictions Weight Bearing Restrictions: No   See Function Navigator for Current Functional Status.   Therapy/Group: Individual Therapy  [name] [facility] 07/16/2016, 12:23 PM

## 2016-07-16 NOTE — Progress Notes (Signed)
Occupational Therapy Session Note  Patient Details  Name: Crystal Parsons MRN: [id number] Date of Birth: 01/21/1946  Today's Date: 07/16/2016 OT Individual Time: 1515-1600 OT Individual Time Calculation (min): 45 min   Short Term Goals: Week 1:  OT Short Term Goal 1 (Week 1): Pt will perform toilet transfer with mod A in order to decrease level of assist with functional transfers. OT Short Term Goal 2 (Week 1): Pt will utilize L UE in functional task with mod multimodal cues. OT Short Term Goal 3 (Week 1): Pt will perform UB dressing with mod A in order to increase I with self care.  OT Short Term Goal 4 (Week 1): Pt will engage in 10 minutes of functional task with less than 2 rest breaks.   Skilled Therapeutic Interventions/Progress Updates:   Therapeutic activities with focus on improved awareness, attention, problem-solving, memory, functional mobility and forced use of LUE in functional task.   Pt received seated in recliner with family present.   Pt required re-ed on need for safety belt but was willing to engage in session to include demonstration of her transfer skills (on/off toilet), clothing management, functional mobility, orientation, endurance, and ability to process instructions and apply basic strategies and problem-solving in standing game play at [place].   Pt ambulated to [facility] gym with close supervision and min instructional cues for direction.  Pt educated on basic game play strategy of Go-Ban (5 in-a-row using magnetic tiles on vertical game board).   Pt performed trial game with max cues for strategy to alternate moves with therapist and for placement of tiles to sequence.   Pt required repeated re-ed on basic strategy and was unable to recognize or perform effective blocking strategy independently during session.   Pt sustained interest in activity for 17 minutes before requesting return to her room d/t fatigue.   Pt was escorted back to her room and to bed with mod instructional  cues for directioin, 2 brief rest breaks at RN station, min assist to reposition for midline orientation while supine in bed.   Bed alarm activated.     Therapy Documentation Precautions:  Precautions Precautions: Fall Precaution Comments: L neglect Restrictions Weight Bearing Restrictions: No   Vital Signs: Therapy Vitals Temp: 98.2 F (36.8 C) Temp Source: Oral Pulse Rate: 87 Resp: 17 BP: (!) 144/67 Patient Position (if appropriate): Sitting Oxygen Therapy SpO2: 99 % O2 Device: Not Delivered   Pain: No/denies pain  See Function Navigator for Current Functional Status.   Therapy/Group: Individual Therapy  [name] 07/16/2016, 4:45 PM

## 2016-07-16 NOTE — Progress Notes (Signed)
Occupational Therapy Session Note  Patient Details  Name: Crystal Parsons MRN: [id number] Date of Birth: Dec 23, 1945  Today's Date: 07/16/2016 OT Individual Time: 1121-1201 and 0920-1001 and [id number] OT Individual Time Calculation (min): 40 min and 41 min and 24 minutes  Short Term Goals: Week 1:  OT Short Term Goal 1 (Week 1): Pt will perform toilet transfer with mod A in order to decrease level of assist with functional transfers. OT Short Term Goal 2 (Week 1): Pt will utilize L UE in functional task with mod multimodal cues. OT Short Term Goal 3 (Week 1): Pt will perform UB dressing with mod A in order to increase I with self care.  OT Short Term Goal 4 (Week 1): Pt will engage in 10 minutes of functional task with less than 2 rest breaks.   Skilled Therapeutic Interventions/Progress Updates:   Pt seen for skilled OT session focusing on L UE NMR during dressing and makeup application task. Pt was lying in bed at time of arrival, agreeable to tx. Supine<sit from elevated [facility] completed with close supervision. Pt then ambulated to drawers with min cues for L visual field scanning, attention, and integration of L UE. Once clothing items were gathered, pt completed dressing in chair at sink. Pt required overall steady assist for standing safety. Cues for sequencing due to pt often pausing during task and requiring prompting to continue with what she started.  Extra time for pt to lift pants over hips. Still exhibits difficultly with orienting clothing items and donned shirt backwards again today. Cues provided for problem solving to correct. Afterwards pt completed makeup application, bilaterally integrating UEs, and bringing hands to midline. Pt completed some of tasks in standing and the rest seated due to fatigue. At end of session pt was returned to w/c and left with family, still applying makeup seated at sink.   2nd Session 1:1 Tx (40 minutes) Pt was sitting in recliner at time of arrival,  agreeable to early tx. Pt ambulated to dayroom with steady assist and mod cues for pathfinding. Once in dayroom, pt completed knitting task involving bilateral integration, midline orientation, L UE NMR, and endurance. Pt completed task in standing for five minutes before fatiguing, and completing her knitted hat while seated for remainder of tx. Sit<stand from armless chair completed with steady assist. Afterwards pt was able to pathfind her way back to her room with min vcs and HHA. Pt left in recliner with all needs within reach and safety belt donned at time of departure.    3rd Session 1:1 Tx (24 min) Pt was in recliner at time of arrival, agreeable to tx in gym. Pt ambulated with HHA and min vcs to therapy gym. Once seated on mat while activity was set up, pt reported that she had had an incontinent episode. Pts pants were soiled in back. Pt ambulated back to room in manner as written above. Pt doffed/donned LB garments with extra time, cues for hemi technique and safety while standing as needed with use of grab bar. Pt was then transferred back to recliner and left with all needs within reach and safety belt donned. Friend also present at time of departure.   Therapy Documentation Precautions:  Precautions Precautions: Fall Precaution Comments: L neglect Restrictions Weight Bearing Restrictions: No  Pain: No c/o pain during session  Pain Assessment Pain Assessment: No/denies pain ADL:      See Function Navigator for Current Functional Status.   Therapy/Group: Individual Therapy  Harshith Pursell A Jeb Schloemer  07/16/2016, 12:18 PM

## 2016-07-16 NOTE — Progress Notes (Signed)
[place] PHYSICAL MEDICINE & REHABILITATION     PROGRESS NOTE  Subjective/Complaints:  Only complains of an itchy scalp..  Objective: Vital Signs: Blood pressure 128/63, pulse (!) 58, temperature 98.3 F (36.8 C), temperature source Oral, resp. rate 18, height 5\' 3"  (1.6 m), weight 182 lb 6.4 oz (82.7 kg), SpO2 98 %.  CBG (last 3)   Recent Labs  07/15/16 1648 07/15/16 2211 07/16/16 0700  GLUCAP 134* 147* 111*   No acute distress HNT exam has a healing wound on her scalp staples in place without erythema or edema. Neck is supple Chest clear to auscultation Cardiac exam S1 and S2 are regular Abdomen; bowel sounds, soft Extremities no edema  Assessment/Plan: 1. 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  Apraxia, left sided weakness, cognitive deficits, decreased attention as well as deficits in mobility and self care tasks secondary to GBM.   Cont CIR 2.  DVT Prophylaxis/Anticoagulation: Mechanical:  Antiembolism stockings, knee (TED hose) Bilateral lower extremities Sequential compression devices, below knee Bilateral lower extremities 3. Pain Management: tylenol effective.  4. Mood: LCSW to follow for evaluation and support.  5. Neuropsych: This patient is not fully capable of making decisions on her own behalf. 6. Skin/Wound Care: Monitor incision for healing. Maintain adequate nutritional and hydration status.  7. Fluids/Electrolytes/Nutrition: Monitor I/Os.    Labs ordered for Monday. 8. New onset seizures: Managed by keppra.  9. Leucocytosis:   Likely reactive due to steroids.   WBCs 17.6 on 12/12    Labs ordered for Monday. 10. HTN: Controlled. 128/63-128/64 11. Steroid induced hyperglycemia: Hgb A1c- 5.5. Will monitor BS ac/hs and use SSI for elevated BS.    CBG (last 3)   Recent Labs  07/15/16 1648 07/15/16 2211 07/16/16 0700  GLUCAP 134* 147* 111*    12. H/o gout: - Controlled on allopurinol and colchicine. 13.  Acute lower UTI:   ecoli- macrobid through 12/18  LOS (Days) 5 A FACE TO FACE EVALUATION WAS PERFORMED  Bruce H Swords 07/16/2016 9:53 AM

## 2016-07-17 LAB — GLUCOSE, CAPILLARY
GLUCOSE-CAPILLARY: 108 mg/dL — AB (ref 65–99)
GLUCOSE-CAPILLARY: 125 mg/dL — AB (ref 65–99)
GLUCOSE-CAPILLARY: 143 mg/dL — AB (ref 65–99)
Glucose-Capillary: 94 mg/dL (ref 65–99)

## 2016-07-17 NOTE — Progress Notes (Signed)
[name] PHYSICAL MEDICINE & REHABILITATION     PROGRESS NOTE  Subjective/Complaints:   Sitting up in bed Wants to go to bathroom Frontal headache this a.m. Objective: Vital Signs: Blood pressure 140/64, pulse 69, temperature 97.7 F (36.5 C), temperature source Oral, resp. rate 18, height 5\' 3"  (1.6 m), weight 184 lb 12.8 oz (83.8 kg), SpO2 98 %.    Well-developed well-nourished female in no acute distress. HEENT exam: large surgical scar on scalp (healing, without erythema), normocephalic, extraocular muscles are intact. Neck is supple. No jugular venous distention no thyromegaly. Chest clear to auscultation without increased work of breathing. Cardiac exam S1 and S2 are regular. Abdominal exam active bowel sounds, soft, nontender. Extremities no edema. Neurologic exam she is alert    CBG (last 3)   Recent Labs  07/16/16 1652 07/16/16 2101 07/17/16 0624  GLUCAP 122* 133* 108*   No acute distress HNT exam has a healing wound on her scalp staples in place without erythema or edema.  Assessment/Plan: 1. 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  Apraxia, left sided weakness, cognitive deficits, decreased attention as well as deficits in mobility and self care tasks secondary to GBM.   Cont CIR 2.  DVT Prophylaxis/Anticoagulation: Mechanical:  Antiembolism stockings, knee (TED hose) Bilateral lower extremities Sequential compression devices, below knee Bilateral lower extremities 3. Pain Management: tylenol effective.  4. Mood: LCSW to follow for evaluation and support.  5. Neuropsych: This patient is not fully capable of making decisions on her own behalf. 6. Skin/Wound Care: Monitor incision for healing. Maintain adequate nutritional and hydration status.  7. Fluids/Electrolytes/Nutrition: Monitor I/Os.    Labs ordered for Monday. 8. New onset seizures: Managed by keppra.  9. Leucocytosis:   Likely reactive due to steroids.   WBCs 17.6  on 12/12   Labs ordered for Monday. 10. HTN: adequately Controlled. 122/46-144/67 11. Steroid induced hyperglycemia: Hgb A1c- 5.5. Will monitor BS ac/hs and use SSI for elevated BS.    CBG (last 3)   Recent Labs  07/16/16 1652 07/16/16 2101 07/17/16 0624  GLUCAP 122* 133* 108*    12. H/o gout: - Controlled on allopurinol and colchicine. 13. Acute lower UTI:   ecoli- macrobid through 12/18  LOS (Days) 6 A FACE TO FACE EVALUATION WAS PERFORMED  Crystal Parsons H Crystal Parsons 07/17/2016 6:44 AM

## 2016-07-18 ENCOUNTER — Encounter (HOSPITAL_COMMUNITY): Payer: Medicare Other

## 2016-07-18 ENCOUNTER — Inpatient Hospital Stay (HOSPITAL_COMMUNITY): Payer: Medicare Other | Admitting: Speech Pathology

## 2016-07-18 ENCOUNTER — Inpatient Hospital Stay (HOSPITAL_COMMUNITY): Payer: Medicare Other | Admitting: Physical Therapy

## 2016-07-18 ENCOUNTER — Inpatient Hospital Stay (HOSPITAL_COMMUNITY): Payer: Medicare Other | Admitting: Occupational Therapy

## 2016-07-18 LAB — BASIC METABOLIC PANEL
Anion gap: 11 (ref 5–15)
BUN: 27 mg/dL — AB (ref 6–20)
CO2: 23 mmol/L (ref 22–32)
Calcium: 9.4 mg/dL (ref 8.9–10.3)
Chloride: 103 mmol/L (ref 101–111)
Creatinine, Ser: 0.74 mg/dL (ref 0.44–1.00)
GFR calc Af Amer: >60 mL/min (ref >60)
GLUCOSE: 118 mg/dL — AB (ref 65–99)
POTASSIUM: 3.7 mmol/L (ref 3.5–5.1)
Sodium: 137 mmol/L (ref 135–145)

## 2016-07-18 LAB — GLUCOSE, CAPILLARY
Glucose-Capillary: 124 mg/dL — ABNORMAL HIGH (ref 65–99)
Glucose-Capillary: 113 mg/dL — ABNORMAL HIGH (ref 65–99)
Glucose-Capillary: 148 mg/dL — ABNORMAL HIGH (ref 65–99)
Glucose-Capillary: 153 mg/dL — ABNORMAL HIGH (ref 65–99)

## 2016-07-18 MED ORDER — SENNOSIDES-DOCUSATE SODIUM 8.6-50 MG PO TABS
2.0000 | ORAL_TABLET | Freq: Every day | ORAL | Status: DC
  Administered 2016-07-18 – 2016-07-19 (×2): 2 via ORAL
  Filled 2016-07-18 (×2): qty 2

## 2016-07-18 MED ORDER — POLYETHYLENE GLYCOL 3350 17 G PO PACK: 17.0000 g | PACK | Freq: Two times a day (BID) | ORAL | Status: DC

## 2016-07-18 MED ORDER — SENNOSIDES-DOCUSATE SODIUM 8.6-50 MG PO TABS: 2.0000 | ORAL_TABLET | Freq: Two times a day (BID) | ORAL | Status: DC

## 2016-07-18 MED ORDER — COLCHICINE 0.6 MG PO TABS: 0.6000 mg | ORAL_TABLET | Freq: Every day | ORAL | Status: DC | PRN

## 2016-07-18 NOTE — Progress Notes (Signed)
[place] PHYSICAL MEDICINE & REHABILITATION     PROGRESS NOTE  Subjective/Complaints:  Pt sitting up in her chair this AM.  Her affect in improving.  She slept well and states she had a good weekend.  She has questions about staple removal.    ROS: Denies CP, SOB, N/V/D.  Objective: Vital Signs: Blood pressure (!) 133/56, pulse 63, temperature 98.1 F (36.7 C), temperature source Oral, resp. rate 18, height 5\' 3"  (1.6 m), weight 82.8 kg (182 lb 8 oz), SpO2 96 %. No results found. No results for input(s): WBC, HGB, HCT, PLT in the last 72 hours.  Recent Labs  07/18/16 0439  NA 137  K 3.7  CL 103  GLUCOSE 118*  BUN 27*  CREATININE 0.74  CALCIUM 9.4   CBG (last 3)   Recent Labs  07/17/16 1641 07/17/16 2052 07/18/16 0642  GLUCAP 125* 143* 124*    Wt Readings from Last 3 Encounters:  07/18/16 82.8 kg (182 lb 8 oz)  07/11/16 85 kg (187 lb 6.3 oz)  06/10/13 79.4 kg (175 lb)    Physical Exam:  BP (!) 133/56 (BP Location: Right Arm)   Pulse 63   Temp 98.1 F (36.7 C) (Oral)   Resp 18   Ht 5\' 3"  (1.6 m)   Wt 82.8 kg (182 lb 8 oz)   SpO2 96%   BMI 32.33 kg/m  Constitutional: She appears well-developed. NAD. HENT: Normocephalic.  Eyes: EOM are normal. No discharge.  Cardiovascular: RRR.  No JVD. Respiratory: Effort normal and breath sounds normal.  GI: Soft. Bowel sounds are normal.  Musculoskeletal: She exhibits edema. She exhibits no tenderness.  Neurological: She is alert.  Left facial weakness  Motor: RUE: 4/5 throughout   LUE: 4/5 throughout.  Apraxia LUE. (stable) B/l LE: 4/5 proximal to distal (stable) Skin: Skin is warm and dry.  Right curvilinear scalp incision clean,dry, intact with staples in place.   Psychiatric: Her speech is delayed. She is slowed. She expresses inappropriate judgment (continues to improve).   Assessment/Plan: 1. 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 Physiatrist is providing close team supervision and 24 hour management of active medical problems listed below. Physiatrist and rehab team continue to assess barriers to discharge/monitor patient progress toward functional and medical goals.  Function:  Bathing Bathing position   Position: Wheelchair/chair at sink  Bathing parts Body parts bathed by patient: Right arm, Left arm, Chest, Abdomen, Front perineal area, Right upper leg, Left upper leg, Buttocks, Right lower leg, Left lower leg, Back Body parts bathed by helper: Right lower leg, Left lower leg  Bathing assist Assist Level: Touching or steadying assistance(Pt > 75%)      Upper Body Dressing/Undressing Upper body dressing   What is the patient wearing?: Pull over shirt/dress     Pull over shirt/dress - Perfomed by patient: Thread/unthread right sleeve, Thread/unthread left sleeve, Put head through opening, Pull shirt over trunk Pull over shirt/dress - Perfomed by helper: Put head through opening, Thread/unthread left sleeve        Upper body assist Assist Level: Supervision or verbal cues      Lower Body Dressing/Undressing Lower body dressing   What is the patient wearing?: Underwear, Pants, Socks, Shoes Underwear - Performed by patient: Thread/unthread right underwear leg, Thread/unthread left underwear leg, Pull underwear up/down   Pants- Performed by patient: Thread/unthread right pants leg, Thread/unthread left pants leg, Pull pants  up/down Pants- Performed by helper: Pull pants up/down   Non-skid slipper socks- Performed by helper: Don/doff right sock, Don/doff left sock Socks - Performed by patient: Don/doff right sock, Don/doff left sock   Shoes - Performed by patient: Don/doff right shoe, Don/doff left shoe Shoes - Performed by helper: Don/doff right shoe, Don/doff left shoe          Lower body assist Assist for lower body dressing: Touching or steadying assistance (Pt > 75%)       Toileting Toileting Toileting activity did not occur: No continent bowel/bladder event Toileting steps completed by patient: Adjust clothing prior to toileting, Performs perineal hygiene, Adjust clothing after toileting Toileting steps completed by helper: Adjust clothing prior to toileting Toileting Assistive Devices: Grab bar or rail  Toileting assist Assist level: Supervision or verbal cues   Transfers Chair/bed transfer Chair/bed transfer activity did not occur: Safety/medical concerns Chair/bed transfer method: Ambulatory, Stand pivot Chair/bed transfer assist level: Touching or steadying assistance (Pt > 75%) Chair/bed transfer assistive device: Armrests     Locomotion Ambulation     Max distance: 175 Assist level: Touching or steadying assistance (Pt > 75%)   Wheelchair   Type: Manual Max wheelchair distance: 150 Assist Level: Total assistance (Pt < 25%)  Cognition Comprehension Comprehension assist level: Follows basic conversation/direction with extra time/assistive device  Expression Expression assist level: Expresses basic needs/ideas: With extra time/assistive device  Social Interaction Social Interaction assist level: Interacts appropriately 75 - 89% of the time - Needs redirection for appropriate language or to initiate interaction.  Problem Solving Problem solving assist level: Solves basic 50 - 74% of the time/requires cueing 25 - 49% of the time  Memory Memory assist level: Recognizes or recalls 75 - 89% of the time/requires cueing 10 - 24% of the time    Medical Problem List and Plan: 1.  Apraxia, left sided weakness, cognitive deficits, decreased attention as well as deficits in mobility and self care tasks secondary to GBM.   Cont CIR 2.  DVT Prophylaxis/Anticoagulation: Mechanical:  Antiembolism stockings, knee (TED hose) Bilateral lower extremities Sequential compression devices, below knee Bilateral lower extremities 3. Pain Management: tylenol  effective.  4. Mood: LCSW to follow for evaluation and support.  5. Neuropsych: This patient is not fully capable of making decisions on her own behalf. 6. Skin/Wound Care: Monitor incision for healing. Maintain adequate nutritional and hydration status.  7. Fluids/Electrolytes/Nutrition: Monitor I/Os.  BMP within acceptable range on 12/18  Encourage fluid intake 8. New onset seizures: Managed by keppra.  9. Leucocytosis:   Likely reactive due to steroids.   WBCs 17.6 on 12/12  Monitor for signs of infection.   Labs pending. 10. HTN: Monitor tid. Continue Apresoline, Cozaar and HCTZ.   Stable 12/18  Monitor with increased activity 11. Steroid induced hyperglycemia: Hgb A1c- 5.5. Will monitor BS ac/hs and use SSI for elevated BS.   Monitor with increased activity  Relatively controlled 12/18 12. H/o gout: - Controlled on allopurinol and colchicine. 13. Acute lower UTI:   UA+, Ucx sensitivities pending  Empiric macrobid started 12/12-12/18  LOS (Days) 7 A FACE TO FACE EVALUATION WAS PERFORMED  Ankit Lorie Phenix 07/18/2016 10:14 AM

## 2016-07-18 NOTE — Progress Notes (Signed)
Physical Therapy Session Note  Patient Details  Name: Crystal Parsons MRN: [id number] Date of Birth: 08-20-1945  Today's Date: 07/18/2016 PT Individual Time: 1101-1201 PT Individual Time Calculation (min): 60 min    Short Term Goals: Week 1:  PT Short Term Goal 1 (Week 1): Pt will perform bed mobility on flat bed with min A PT Short Term Goal 2 (Week 1): Pt will perform bed <> chair transfers stand pivot with min A  PT Short Term Goal 3 (Week 1): Pt will perform gait x 150' with min A with mod cues for attention to L environment PT Short Term Goal 4 (Week 1): Pt will perform up/down 4 stairs with 2 rails and min A with 50% cues for attention to LUE and LLE placement  Skilled Therapeutic Interventions/Progress Updates:   Pt received in w/c; pt reporting headache but did not wish to request anything for pain.  Pt performed sit > stand and ambulation to toilet, all toileting tasks and hand hygiene at sink with supervision.  Pt ambulated to gym x 200' without AD with supervision with min verbal cues for attention to environment.  Performed NMR; see below for details.  Returned to room with supervision and pt set up in recliner with all items within reach for lunch-attempt to trial without quick release belt but pt reports she got up alone this am and needs the belt demonstrating improved awareness of deficits.  Also discussed with pt and husband that now that pt is at supervision level she would be safe to ambulate with family to/from bathroom.  Therapy Documentation Precautions:  Precautions Precautions: Fall Precaution Comments: L neglect Restrictions Weight Bearing Restrictions: No Pain: Pain Assessment Pain Assessment: No/denies pain  Other Treatments: Treatments Neuromuscular Facilitation: Left;Lower Extremity;Forced use;Activity to increase timing and sequencing;Activity to increase sustained activation;Activity to increase lateral weight shifting;Activity to increase  anterior-posterior weight shifting during gait training on treadmill initially with bilat UE support x 411 ft at 1.0 mph x 6:32 with verbal cues for full weight shifting, full step length and foot clearance and heel strike.  Pt began to report RUE fatigue.  After seated rest break pt continued gait on treadmill x 3 minutes at slower speed 0.3 to focus on releasing UE support and maintaining full weight shift, stance time and foot clearance-pt only able to perform x 3-4 seconds at a time before returning to shuffling gait.  Performed partial single limb stance training with one foot on foam x 3 reps x 10 second hold without UE support on each side; also performed x 2 reps each side with vertical and horizontal head turns.     See Function Navigator for Current Functional Status.   Therapy/Group: Individual Therapy  [name] [facility] 07/18/2016, 12:33 PM

## 2016-07-18 NOTE — Progress Notes (Signed)
Occupational Therapy Session Note  Patient Details  Name: Crystal Parsons MRN: [id number] Date of Birth: 1946-01-13  Today's Date: 07/18/2016 OT Individual Time: 1420-1535 OT Individual Time Calculation (min): 75 min   Short Term Goals: Week 1:  OT Short Term Goal 1 (Week 1): Pt will perform toilet transfer with mod A in order to decrease level of assist with functional transfers. OT Short Term Goal 2 (Week 1): Pt will utilize L UE in functional task with mod multimodal cues. OT Short Term Goal 3 (Week 1): Pt will perform UB dressing with mod A in order to increase I with self care.  OT Short Term Goal 4 (Week 1): Pt will engage in 10 minutes of functional task with less than 2 rest breaks.  Week 2:     Skilled Therapeutic Interventions/Progress Updates:   Pt was sitting in recliner at time of arrival, agreeable to shower. She gathered ADL items with supervision and questioning cues with pt initially collecting too many shirts/pants. After placing items on bed, pt ambulated with supervision to tub bench. Shower cap donned to cover staples. Pt completed bathing with Min A, attending to left side of body and integrating L UE at midline. Pt able to self sequence bathing tasks with min cues. She then ambulated into room and transferred to w/c at sink with supervision. Dressing completed with items placed on left and pt requiring cuing for shirt orientation. Makeup application/oral care completed at this time as well. Due to pt having partially made bed, pt was agreeable to retrieve blankets and pillowcases to finish bedmaking. She ambulated down hallway with supervision and gathered necessary items in 3 storage closets (due to inability to locate pillowcases in 1st 2 closets). Pt required cues for left visual field scanning and retrieving appropriate number of items (pt tends to gather too much of 1 thing). Pt then ambulated back to room and participated in bed making with focus on bilateral  integration/L UE NMR, and self sequencing IADL task. Cues for figure ground during task as well. At end of tx pt was returned to bed and left with all needs within reach.   Therapy Documentation Precautions:  Precautions Precautions: Fall Precaution Comments: L neglect Restrictions Weight Bearing Restrictions: No   Vital Signs: Therapy Vitals Temp: 97.6 F (36.4 C) Temp Source: Oral Pulse Rate: 74 Resp: 18 BP: (!) 118/48 Patient Position (if appropriate): Lying Oxygen Therapy SpO2: 98 % Pain: No c/o pain during session    ADL:    See Function Navigator for Current Functional Status.   Therapy/Group: Individual Therapy  Aide Wojnar A Kenneth Lax 07/18/2016, 4:18 PM

## 2016-07-18 NOTE — Progress Notes (Signed)
Speech Language Pathology Daily Session Note  Patient Details  Name: Crystal Parsons MRN: [id number] Date of Birth: 06/18/1946  Today's Date: 07/18/2016 SLP Individual Time: 1000-1030 SLP Individual Time Calculation (min): 30 min   Short Term Goals: Week 1: SLP Short Term Goal 1 (Week 1): Pt will consume regular textures and thin liquids with mod I use of swallowing precautions.   SLP Short Term Goal 2 (Week 1): Pt will selectively attend to task in a mildly distracting environment for 5 minutes with min verbal cues for redirection.   SLP Short Term Goal 3 (Week 1): Pt will visually scan to the left of midline in 75% of opportunities with supervision verbal cues.   SLP Short Term Goal 4 (Week 1): Pt will complete semi-complex tasks with min assist verbal cues for functional problem solving.   SLP Short Term Goal 5 (Week 1): Pt will return demonstration of diaphragmatic breathing exercises to improve breath support for speech with supervision.   SLP Short Term Goal 6 (Week 1): Pt will utilize increased vocal intensity to achieve intelligibility at the conversational level with mod I.    Skilled Therapeutic Interventions: Skilled treatment session focused on cognition goals. SLP facilitated the session by providing Mod A verbal cues for completion of semi-complex tasks for functional problem solving of checkbook completion tasks. Pt able to selectively attention to task in mildly distracting environment for 15 minutes with min A verbal cues for redirection. Pt was returned to room. Left in wheelchair, safety belt donned and all needs within reach.   Function:   Cognition Comprehension Comprehension assist level: Follows basic conversation/direction with extra time/assistive device  Expression   Expression assist level: Expresses basic needs/ideas: With extra time/assistive device  Social Interaction Social Interaction assist level: Interacts appropriately 75 - 89% of the time - Needs  redirection for appropriate language or to initiate interaction.  Problem Solving Problem solving assist level: Solves basic 50 - 74% of the time/requires cueing 25 - 49% of the time  Memory Memory assist level: Recognizes or recalls 75 - 89% of the time/requires cueing 10 - 24% of the time    Pain Pain Assessment Pain Assessment: No/denies pain  Therapy/Group: Individual Therapy  Freedom Lopezperez 07/18/2016, 12:41 PM

## 2016-07-18 NOTE — Progress Notes (Signed)
Speech Language Pathology Daily Session Note  Patient Details  Name: Crystal Parsons MRN: [id number] Date of Birth: February 09, 1946  Today's Date: 07/18/2016 SLP Individual Time: [id number] SLP Individual Time Calculation (min): 40 min   Short Term Goals: Week 1: SLP Short Term Goal 1 (Week 1): Pt will consume regular textures and thin liquids with mod I use of swallowing precautions.   SLP Short Term Goal 2 (Week 1): Pt will selectively attend to task in a mildly distracting environment for 5 minutes with min verbal cues for redirection.   SLP Short Term Goal 3 (Week 1): Pt will visually scan to the left of midline in 75% of opportunities with supervision verbal cues.   SLP Short Term Goal 4 (Week 1): Pt will complete semi-complex tasks with min assist verbal cues for functional problem solving.   SLP Short Term Goal 5 (Week 1): Pt will return demonstration of diaphragmatic breathing exercises to improve breath support for speech with supervision.   SLP Short Term Goal 6 (Week 1): Pt will utilize increased vocal intensity to achieve intelligibility at the conversational level with mod I.    Skilled Therapeutic Interventions:  Pt was seen for skilled ST targeting cognitive goals.  SLP facilitated the session with a money management task to address goals for functional problem solving.  Pt required mod assist verbal cues to count money and make change due to perseveration, decreased attention to task and decreased error awareness.  Pt needed min assist question cues for insight into task difficulty.  Pt was left in wheelchair with call bell within reach and quick release belt donned for safety.  Continue per current plan of care.       Function:  Eating Eating                 Cognition Comprehension    Expression      Social Interaction    Problem Solving    Memory      Pain Pain Assessment Pain Assessment: No/denies pain  Therapy/Group: Individual Therapy  [place] Nishi, Selinda Orion 07/18/2016, 9:47 AM

## 2016-07-19 ENCOUNTER — Ambulatory Visit
Admit: 2016-07-19 | Discharge: 2016-07-19 | Disposition: A | Discharge status: Home / Self Care | Payer: Medicare Other | Attending: Radiation Oncology | Admitting: Radiation Oncology

## 2016-07-19 ENCOUNTER — Inpatient Hospital Stay (HOSPITAL_COMMUNITY): Payer: Medicare Other | Admitting: Physical Therapy

## 2016-07-19 ENCOUNTER — Inpatient Hospital Stay (HOSPITAL_COMMUNITY): Payer: Medicare Other | Admitting: Occupational Therapy

## 2016-07-19 ENCOUNTER — Inpatient Hospital Stay (HOSPITAL_COMMUNITY): Payer: Medicare Other | Admitting: Speech Pathology

## 2016-07-19 DIAGNOSIS — C719 Malignant neoplasm of brain, unspecified: Secondary | ICD-10-CM

## 2016-07-19 DIAGNOSIS — R531 Weakness: Secondary | ICD-10-CM

## 2016-07-19 LAB — CBC WITH DIFFERENTIAL/PLATELET
Basophils Absolute: 0 10*3/uL (ref 0.0–0.1)
Basophils Relative: 0 %
Eosinophils Absolute: 0.1 10*3/uL (ref 0.0–0.7)
Eosinophils Relative: 1 %
HEMATOCRIT: 35.7 % — AB (ref 36.0–46.0)
HEMOGLOBIN: 12 g/dL (ref 12.0–15.0)
LYMPHS ABS: 1.8 10*3/uL (ref 0.7–4.0)
LYMPHS PCT: 11 %
MCH: 29.9 pg (ref 26.0–34.0)
MCHC: 33.6 g/dL (ref 30.0–36.0)
MCV: 88.8 fL (ref 78.0–100.0)
MONO ABS: 0.9 10*3/uL (ref 0.1–1.0)
MONOS PCT: 6 %
NEUTROS ABS: 12.7 10*3/uL — AB (ref 1.7–7.7)
NEUTROS PCT: 82 %
Platelets: 396 10*3/uL (ref 150–400)
RBC: 4.02 MIL/uL (ref 3.87–5.11)
RDW: 12.7 % (ref 11.5–15.5)
WBC: 15.4 10*3/uL — ABNORMAL HIGH (ref 4.0–10.5)

## 2016-07-19 LAB — GLUCOSE, CAPILLARY
GLUCOSE-CAPILLARY: 123 mg/dL — AB (ref 65–99)
Glucose-Capillary: 111 mg/dL — ABNORMAL HIGH (ref 65–99)
Glucose-Capillary: 124 mg/dL — ABNORMAL HIGH (ref 65–99)
Glucose-Capillary: 139 mg/dL — ABNORMAL HIGH (ref 65–99)

## 2016-07-19 NOTE — Progress Notes (Signed)
[facility]     PROGRESS NOTE  Subjective/Complaints:  Pt seen sitting up in her chair this AM.  She slept well overnight.  She is pleased that her staples have been removed.  She feels she is doing better.   ROS: Denies CP, SOB, N/V/D.  Objective: Vital Signs: Blood pressure (!) 122/48, pulse 78, temperature 97.8 F (36.6 C), temperature source Oral, resp. rate 16, height 5\' 3"  (1.6 m), weight 83 kg (182 lb 15.7 oz), SpO2 99 %. No results found.  Recent Labs  07/19/16 0503  WBC 15.4*  HGB 12.0  HCT 35.7*  PLT 396    Recent Labs  07/18/16 0439  NA 137  K 3.7  CL 103  GLUCOSE 118*  BUN 27*  CREATININE 0.74  CALCIUM 9.4   CBG (last 3)   Recent Labs  07/18/16 1651 07/18/16 2116 07/19/16 0615  GLUCAP 148* 153* 111*    Wt Readings from Last 3 Encounters:  07/19/16 83 kg (182 lb 15.7 oz)  07/11/16 85 kg (187 lb 6.3 oz)  06/10/13 79.4 kg (175 lb)    Physical Exam:  BP (!) 122/48 (BP Location: Right Arm)   Pulse 78   Temp 97.8 F (36.6 C) (Oral)   Resp 16   Ht 5\' 3"  (1.6 m)   Wt 83 kg (182 lb 15.7 oz)   SpO2 99%   BMI 32.41 kg/m  Constitutional: She appears well-developed. NAD. HENT: Normocephalic.  Eyes: EOM are normal. No discharge.  Cardiovascular: RRR.  No JVD. Respiratory: Effort normal and breath sounds normal.  GI: Soft. Bowel sounds are normal.  Musculoskeletal: She exhibits edema. She exhibits no tenderness.  Neurological: She is alert.  Left facial weakness  Motor: RUE: 4/5 throughout   LUE: 4/5 throughout.  Apraxia LUE. (unchanged) B/l LE: 4/5 proximal to distal (unchanged) Skin: Skin is warm and dry.  Right curvilinear scalp incision clean,dry, intact, staples removed.   Psychiatric: Her speech is delayed. She is slowed. She expresses inappropriate judgment (continues to improve).   Assessment/Plan: 1. 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 Physiatrist is providing close team supervision and 24 hour management of active medical problems listed below. Physiatrist and rehab team continue to assess barriers to discharge/monitor patient progress toward functional and medical goals.  Function:  Bathing Bathing position   Position: Shower  Bathing parts Body parts bathed by patient: Right arm, Left arm, Chest, Abdomen, Front perineal area, Right upper leg, Left upper leg, Buttocks, Right lower leg, Left lower leg Body parts bathed by helper: Back  Bathing assist Assist Level: Supervision or verbal cues      Upper Body Dressing/Undressing Upper body dressing   What is the patient wearing?: Pull over shirt/dress     Pull over shirt/dress - Perfomed by patient: Thread/unthread right sleeve, Thread/unthread left sleeve, Put head through opening, Pull shirt over trunk Pull over shirt/dress - Perfomed by helper: Put head through opening, Thread/unthread left sleeve        Upper body assist Assist Level: Supervision or verbal cues      Lower Body Dressing/Undressing Lower body dressing   What is the patient wearing?: Underwear, Pants, Socks, Shoes Underwear - Performed by patient: Thread/unthread right underwear leg, Thread/unthread left underwear leg, Pull underwear up/down   Pants- Performed by patient: Thread/unthread right pants leg, Thread/unthread left pants leg, Pull pants up/down Pants- Performed by helper: Pull  pants up/down   Non-skid slipper socks- Performed by helper: Don/doff right sock, Don/doff left sock Socks - Performed by patient: Don/doff right sock, Don/doff left sock   Shoes - Performed by patient: Don/doff left shoe, Don/doff right shoe Shoes - Performed by helper: Don/doff right shoe, Don/doff left shoe          Lower body assist Assist for lower body dressing: Supervision or verbal cues      Toileting Toileting Toileting activity did not occur: No  continent bowel/bladder event Toileting steps completed by patient: Adjust clothing prior to toileting, Performs perineal hygiene, Adjust clothing after toileting Toileting steps completed by helper: Adjust clothing prior to toileting Toileting Assistive Devices: Grab bar or rail  Toileting assist Assist level: Supervision or verbal cues   Transfers Chair/bed transfer Chair/bed transfer activity did not occur: Safety/medical concerns Chair/bed transfer method: Ambulatory, Stand pivot Chair/bed transfer assist level: Supervision or verbal cues Chair/bed transfer assistive device: Armrests     Locomotion Ambulation     Max distance: 200 Assist level: Supervision or verbal cues   Wheelchair   Type: Manual Max wheelchair distance: 150 Assist Level: Total assistance (Pt < 25%)  Cognition Comprehension Comprehension assist level: Follows basic conversation/direction with extra time/assistive device  Expression Expression assist level: Expresses basic needs/ideas: With extra time/assistive device  Social Interaction Social Interaction assist level: Interacts appropriately 75 - 89% of the time - Needs redirection for appropriate language or to initiate interaction.  Problem Solving Problem solving assist level: Solves basic 50 - 74% of the time/requires cueing 25 - 49% of the time  Memory Memory assist level: Recognizes or recalls 75 - 89% of the time/requires cueing 10 - 24% of the time    Medical Problem List and Plan: 1.  Apraxia, left sided weakness, cognitive deficits, decreased attention as well as deficits in mobility and self care tasks secondary to GBM.   Cont CIR 2.  DVT Prophylaxis/Anticoagulation: Mechanical:  Antiembolism stockings, knee (TED hose) Bilateral lower extremities Sequential compression devices, below knee Bilateral lower extremities 3. Pain Management: tylenol effective.  4. Mood: LCSW to follow for evaluation and support.  5. Neuropsych: This patient is not  fully capable of making decisions on her own behalf. 6. Skin/Wound Care: Monitor incision for healing. Maintain adequate nutritional and hydration status.  7. Fluids/Electrolytes/Nutrition: Monitor I/Os.  BMP within acceptable range on 12/18  Encourage fluid intake 8. New onset seizures: Managed by keppra.  9. Leucocytosis:   Likely reactive due to steroids.   WBCs 15.4 on 12/19  Monitor for signs of infection.  10. HTN: Monitor tid. Continue Apresoline, Cozaar and HCTZ.   Stable 12/19  Monitor with increased activity 11. Steroid induced hyperglycemia: Hgb A1c- 5.5. Will monitor BS ac/hs and use SSI for elevated BS.   Monitor with increased activity  Relatively controlled 12/19 12. H/o gout: - Controlled on allopurinol and colchicine. 13. Acute lower UTI:   Completed macrobid 12/18  LOS (Days) 8 A FACE TO FACE EVALUATION WAS PERFORMED  Crystal Parsons Crystal Parsons 07/19/2016 10:34 AM

## 2016-07-19 NOTE — Progress Notes (Signed)
Occupational Therapy Session Note  Patient Details  Name: Crystal Parsons MRN: [id number] Date of Birth: 1945-09-27  Today's Date: 07/19/2016 OT Individual Time: 1030-1157 OT Individual Time Calculation (min): 87 min     Short Term Goals: Week 1:  OT Short Term Goal 1 (Week 1): Pt will perform toilet transfer with mod A in order to decrease level of assist with functional transfers. OT Short Term Goal 2 (Week 1): Pt will utilize L UE in functional task with mod multimodal cues. OT Short Term Goal 3 (Week 1): Pt will perform UB dressing with mod A in order to increase I with self care.  OT Short Term Goal 4 (Week 1): Pt will engage in 10 minutes of functional task with less than 2 rest breaks.   Skilled Therapeutic Interventions/Progress Updates:     Upon entering the room, pt seated in recliner chair awaiting therapist with no c/o pain. Pt knitting with B hands when entering the room and unable to continue in task during conversation. Pt declined bathing and dressing this session. Pt ambulating without device to bathroom for toileting and pt needing close supervison for clothing management and hygiene. Pt making coffee this session with min verbal cues for task. Pt attempting to ambulate with knitting in L hand as she is unaware of it being there. Pt ambulating with coffee cup back to room without spillage. Pt requesting to wash and dry hair with min A this session. Pt needing to sit for task but utilized L UE without cues to do so. Pt's husband entered the room and therapist educated him on need for quick release belt and not to walk with pt in room secondary to safety concerns at this time. OT also provided him with a sheet for purchase of recommended shower chair as well as recommendation for safety treads in order to increase safety at home. Discharge planning to continue. Call bell and all needed items within reach and husband remaining in the room.   Therapy Documentation Precautions:   Precautions Precautions: Fall Precaution Comments: L neglect Restrictions Weight Bearing Restrictions: No General:   Vital Signs:  Pain: Pain Assessment Pain Assessment: No/denies pain Pain Score: 0-No pain ADL:   Exercises:   Other Treatments:    See Function Navigator for Current Functional Status.   Therapy/Group: Individual Therapy  Gypsy Decant 07/19/2016, 12:38 PM

## 2016-07-19 NOTE — Progress Notes (Signed)
Physical Therapy Weekly Progress Note  Patient Details  Name: Crystal Parsons MRN: [id number] Date of Birth: 1946/01/30  Beginning of progress report period: July 12, 2016 End of progress report period: July 19, 2016  Today's Date: 07/19/2016 PT Individual Time: 1155-2080 PT Individual Time Calculation (min): 60 min   Patient has made fast progress and has met 5 of 5 short term goals.  Pt is currently supervision-min A overall for transfers, gait, and stair negotiation.  Patient continues to demonstrate the following deficits muscle weakness, impaired timing and sequencing, decreased coordination and decreased motor planning, decreased attention to left, decreased initiation, decreased attention, decreased awareness, decreased problem solving, decreased memory and delayed processing and decreased standing balance, decreased postural control, hemiplegia and decreased balance strategies and therefore will continue to benefit from skilled PT intervention to increase functional independence with mobility.  Patient progressing toward long term goals..  Continue plan of care.  PT Short Term Goals Week 1:  PT Short Term Goal 1 (Week 1): Pt will perform bed mobility on flat bed with min A PT Short Term Goal 1 - Progress (Week 1): Met PT Short Term Goal 2 (Week 1): Pt will perform bed <> chair transfers stand pivot with min A  PT Short Term Goal 2 - Progress (Week 1): Met PT Short Term Goal 3 (Week 1): Pt will perform gait x 150' with min A with mod cues for attention to L environment PT Short Term Goal 3 - Progress (Week 1): Met PT Short Term Goal 4 (Week 1): Pt will perform up/down 4 stairs with 2 rails and min A with 50% cues for attention to LUE and LLE placement PT Short Term Goal 4 - Progress (Week 1): Met Week 2:  PT Short Term Goal 1 (Week 2): = LTG of supervision overall  Skilled Therapeutic Interventions/Progress Updates:   Pt received in recliner; no c/o pain.  Pt performed  all sit <> stand, transfers and ambulation to gym in controlled environment with supervision and improved attention to L and negotiation around obstacles and improved gait speed.  Performed re-assessment of BERG balance with pt score increasing to 50/56.  Patient demonstrates increased fall risk as noted by score of 50/56 on Berg Balance Scale.  (<36= high risk for falls, close to 100%; 37-45 significant >80%; 46-51 moderate >50%; 52-55 lower >25%).  Performed stair negotiation training up/down 4 larger stairs x 2 reps with one UE support on R rail and min A.  Performed dynamic balance, dual tasking and use of LUE during basketball bouncing and tossing during forwards, backwards and R/L lateral gait, trunk rotation and ball pass during retro gait and braiding to L and R with min A.  Performed floor transfer training and discussed indications for calling EMS with min A.  Returned to room and pt transferred to bed with supervision.  Pt left in bed to rest with all items within reach.   Therapy Documentation Precautions: Precautions Precautions: Fall Precaution Comments: L neglect Restrictions Weight Bearing Restrictions: No Pain: Pain Assessment Pain Assessment: No/denies pain  See Function Navigator for Current Functional Status.  Therapy/Group: Individual Therapy  [name] Faucette 07/19/2016, 4:20 PM

## 2016-07-19 NOTE — Consult Note (Addendum)
  NEUROCOGNITIVE TESTING - [facility]   MEDICAL NECESSITY:  Gionna Comptom was seen on the [facility] for neurocognitive testing owing to the patient's diagnosis of brain tumor.   Records indicate that Mrs. Bas is a "70 y.o. female with history of HTN, Lynch syndrome, palpitations, recent gout flare who was admitted on 07/01/16 with multiple episodes of seizures and requires intubation in ED for airway protection. 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 She was extubated without difficulty and was noted to have mild LUE weakness, cognitive deficits affecting processing, attention and memory. Surgical intervention recommended by Dr. Kathyrn Sheriff and she underwent stereotactic right frontal crani for tumor resection on 12/7."   During today's visit, Mrs. Mendel denied experiencing any cognitive issues for any reason. She reported suffering gradual hearing loss over time that she does not attribute to her present medical circumstances. Emotionally, she described herself as doing "surprisingly well" with no depressive or anxious symptoms present. She has a history of treatment for depression via Zoloft that she initially felt to be helpful; it was eventually discontinued. However, she later felt that she required it again but had an adverse reaction to it. She does not currently take any psychopharmacological medications and does not feel it is required. Mrs. Axtell also participated in counseling in the past that she found to be valuable; she is not presently engaging in this activity. No adjustment issues endorsed. Suicidal/homicidal ideation, plan or intent was denied. No manic or hypomanic episodes were reported. The patient denied ever experiencing any auditory/visual hallucinations. No  major behavioral or personality changes were endorsed.   Mrs. Blow described therapy as "hard" but she feels that she is making progress.  No barriers to therapy identified. She described the rehab staff as "wonderful." She is discharging on the 24th and has no trepidation about doing so. She has ample social support via her husband and family.   The patient was referred for neuropsychological consultation given the possibility of cognitive sequelae subsequent to the current medical status and in order to assist in treatment planning.   PROCEDURES: [2 units of 96118]   Cognitive Evaluation: Testing revealed intact orientation and immediate attention. Working memory was also intact. Processing speed was slow. Immediate learning was normal, though delayed free recall was mildly impaired. She mildly benefited from recognition cues. Remote memory appears intact. Language functions were normal. Visuoperceptual abilities were within normal limits, though constructional praxis was impaired.    IMPRESSION: Overall, Mrs. Canoy denied experiencing any cognitive issues, though mild issues with delayed free recall and processing speed were noted via brief cognitive screen. The most probable etiology for these deficits is the brain tumors and post-resection surgery side effects. Emotionally, there are no major indications of active clinical psychopathology. I immediately discussed the testing results with the patient and mentioned that while it is not a priority or entirely necessary that she could undergo comprehensive neuropsychological evaluation upon discharge. I encouraged her to speak with the rehab staff if her mood changes or if she felt that psychological services might be valuable, though at this point no immediate recommendations are offered.    Rutha Bouchard, Psy.D., [place] Neuropsychologist  Rehabilitation Psychologist

## 2016-07-19 NOTE — Progress Notes (Signed)
Speech Language Pathology Weekly Progress and Session Note  Patient Details  Name: Crystal Parsons MRN: [id number] Date of Birth: August 02, 1945  Beginning of progress report period:  July 12, 2016 End of progress report period: July 19, 2016   Today's Date: 07/19/2016 SLP Individual Time: 0800-0900 SLP Individual Time Calculation (min): 60 min   Short Term Goals: Week 1: SLP Short Term Goal 1 (Week 1): Pt will consume regular textures and thin liquids with mod I use of swallowing precautions.   SLP Short Term Goal 1 - Progress (Week 1): Met SLP Short Term Goal 2 (Week 1): Pt will selectively attend to task in a mildly distracting environment for 5 minutes with min verbal cues for redirection.   SLP Short Term Goal 2 - Progress (Week 1): Met SLP Short Term Goal 3 (Week 1): Pt will visually scan to the left of midline in 75% of opportunities with supervision verbal cues.   SLP Short Term Goal 3 - Progress (Week 1): Met SLP Short Term Goal 4 (Week 1): Pt will complete semi-complex tasks with min assist verbal cues for functional problem solving.   SLP Short Term Goal 4 - Progress (Week 1): Progressing toward goal SLP Short Term Goal 5 (Week 1): Pt will return demonstration of diaphragmatic breathing exercises to improve breath support for speech with supervision.   SLP Short Term Goal 5 - Progress (Week 1): Other (comment) (not addressed this reporting period to allow focus on cognitive goals.  ) SLP Short Term Goal 6 (Week 1): Pt will utilize increased vocal intensity to achieve intelligibility at the conversational level with mod I.   SLP Short Term Goal 6 - Progress (Week 1): Other (comment) (not addressed this reporting period to allow focus on cognitive goals.  )    New Short Term Goals: Week 2: SLP Short Term Goal 1 (Week 2): Pt will selectively attend to task in a mildly distracting environment for 5 minutes with supervision verbal cues for redirection.   SLP Short Term Goal 2  (Week 2): Pt will visually scan to the left of midline in 75% of opportunities with mod I.   SLP Short Term Goal 3 (Week 2): Pt will complete semi-complex tasks with min assist verbal cues for functional problem solving.   SLP Short Term Goal 4 (Week 2): Pt will return demonstration of diaphragmatic breathing exercises to improve breath support for speech with supervision.   SLP Short Term Goal 5 (Week 2): Pt will utilize increased vocal intensity to achieve intelligibility at the conversational level with mod I.    Weekly Progress Updates: Pt has made functional gains this reporting period and has met 3 out of 4 targeted goals.  Pt is currently overall min-mod assist during semi-complex cognitive tasks due to left inattention, decreased awareness of deficits, decreased functional problem solving, slowed processing speed, and decreased recall of new information.  Pt's diet has been upgraded to regular textures and thin liquids which she is consuming with mod I.  As a result, pt would benefit from ongoing skilled ST to address cognitive and speech intelligibility goals.  Pt and family education is ongoing.  Continue to recommend 24/7 supervision at discharge in addition to [place] follow up at next level of care.      Intensity: Minumum of 1-2 x/day, 30 to 90 minutes Frequency: 3 to 5 out of 7 days Duration/Length of Stay: 14-17 days  Treatment/Interventions: Cognitive remediation/compensation;Cueing hierarchy;Dysphagia/aspiration precaution training;Environmental controls;Functional tasks;Internal/external aids;Patient/family education   Daily Session  Skilled Therapeutic Interventions: Pt was seen for skilled ST targeting cognitive goals.  Pt requested to complete sinkside ADLs this morning.  Pt put on make up, brushed hair, and washed face with min assist-supervision cues for safety while ambulating around the room.  Pt needed min assist to attend to left upper extremity while carrying a coffee mug  during ambulation.  Pt was able to recall function of medication when named for ~75% accuracy with min assist question cues.  Pt was returned to room and left with call bell within reach and quick release belt donned for safety.  Continue per current plan of care.       Function:   Eating Eating                 Cognition Comprehension Comprehension assist level: Follows basic conversation/direction with extra time/assistive device  Expression   Expression assist level: Expresses basic needs/ideas: With extra time/assistive device  Social Interaction Social Interaction assist level: Interacts appropriately 75 - 89% of the time - Needs redirection for appropriate language or to initiate interaction.  Problem Solving Problem solving assist level: Solves basic 50 - 74% of the time/requires cueing 25 - 49% of the time  Memory Memory assist level: Recognizes or recalls 75 - 89% of the time/requires cueing 10 - 24% of the time   General    Pain Pain Assessment Pain Assessment: No/denies pain Pain Score: 0-No pain  Therapy/Group: Individual Therapy  Dyllen Menning, Selinda Orion 07/19/2016, 12:16 PM

## 2016-07-20 ENCOUNTER — Encounter (HOSPITAL_COMMUNITY): Payer: Self-pay | Admitting: Hematology and Oncology

## 2016-07-20 ENCOUNTER — Inpatient Hospital Stay (HOSPITAL_COMMUNITY): Payer: Medicare Other | Admitting: Occupational Therapy

## 2016-07-20 ENCOUNTER — Encounter: Payer: Self-pay | Admitting: Hematology and Oncology

## 2016-07-20 ENCOUNTER — Inpatient Hospital Stay (HOSPITAL_COMMUNITY): Payer: Medicare Other | Admitting: *Deleted

## 2016-07-20 ENCOUNTER — Inpatient Hospital Stay (HOSPITAL_COMMUNITY): Payer: Medicare Other | Admitting: Physical Therapy

## 2016-07-20 ENCOUNTER — Inpatient Hospital Stay (HOSPITAL_COMMUNITY): Payer: Medicare Other | Admitting: Speech Pathology

## 2016-07-20 DIAGNOSIS — D126 Benign neoplasm of colon, unspecified: Secondary | ICD-10-CM | POA: Insufficient documentation

## 2016-07-20 DIAGNOSIS — Z90710 Acquired absence of both cervix and uterus: Secondary | ICD-10-CM | POA: Insufficient documentation

## 2016-07-20 LAB — GLUCOSE, CAPILLARY
GLUCOSE-CAPILLARY: 93 mg/dL (ref 65–99)
GLUCOSE-CAPILLARY: 107 mg/dL — AB (ref 65–99)
GLUCOSE-CAPILLARY: 123 mg/dL — AB (ref 65–99)
GLUCOSE-CAPILLARY: 104 mg/dL — AB (ref 65–99)
GLUCOSE-CAPILLARY: 160 mg/dL — AB (ref 65–99)

## 2016-07-20 MED ORDER — SENNOSIDES-DOCUSATE SODIUM 8.6-50 MG PO TABS
2.0000 | ORAL_TABLET | Freq: Two times a day (BID) | ORAL | Status: DC
  Administered 2016-07-20 – 2016-07-21 (×2): 2 via ORAL
  Filled 2016-07-20 (×4): qty 2

## 2016-07-20 NOTE — Progress Notes (Signed)
Occupational Therapy Session Note  Patient Details  Name: Crystal Parsons MRN: [id number] Date of Birth: September 02, 1945  Today's Date: 07/20/2016 OT Individual Time: 0700-0813 OT Individual Time Calculation (min): 73 min     Short Term Goals: Week 1:  OT Short Term Goal 1 (Week 1): Pt will perform toilet transfer with mod A in order to decrease level of assist with functional transfers. OT Short Term Goal 2 (Week 1): Pt will utilize L UE in functional task with mod multimodal cues. OT Short Term Goal 3 (Week 1): Pt will perform UB dressing with mod A in order to increase I with self care.  OT Short Term Goal 4 (Week 1): Pt will engage in 10 minutes of functional task with less than 2 rest breaks.   Skilled Therapeutic Interventions/Progress Updates:     Upon entering the room, pt supine in bed with no c/o pain this session. Pt ambulated without assistive device and supervision to obtain and clothing items needed for bathing and dressing. Pt engaged in bathing from shower level with overall supervision. Pt needing min - mod cues for problem solving, initiation, and L inattention during session. Pt dressed from EOB with supervision as well. Pt transferred to recliner chair with breakfast tray placed in front of her. Pt eating while OT discussed pt progress towards goals with plans for upcoming discharge. Pt remained seated in recliner chair at end of session with call bell and all needed items within reach.   Therapy Documentation Precautions:  Precautions Precautions: Fall Precaution Comments: L neglect Restrictions Weight Bearing Restrictions: No General:   Vital Signs:  Pain: Pain Assessment Pain Assessment: No/denies pain ADL:   Exercises:   Other Treatments:    See Function Navigator for Current Functional Status.   Therapy/Group: Individual Therapy  Gypsy Decant 07/20/2016, 10:40 AM

## 2016-07-20 NOTE — Consult Note (Signed)
[facility] CONSULT NOTE  Patient Care Team: Lavone Orn, MD as PCP - General (Internal Medicine)  CHIEF COMPLAINTS/PURPOSE OF CONSULTATION:  Glioblastoma  HISTORY OF PRESENTING ILLNESS:  Crystal Parsons 70 y.o. female is here because of recent diagnosis of glioblastoma. The patient is seen today in her room. Her husband is present. The patient has interesting history of possible diagnosis of Lynch syndrome. Both her parents had diagnosis of colon cancer. Apparently, the patient underwent some form of genetic testing 2 years ago and she is vigilant with colonoscopy screening. The patient herself has minimum chronic medical issues. She presented with unresponsive, staring episodes on 06/30/2016. Prior to that, she did recalled having issues with headaches but denies focal neurological deficits. Her husband called EMS thinking she might have seizures. She was evaluated that day at home but due to stable vital signs, she was not brought into the hospital initially and she made an appointment to see her primary care doctor the next day. That same evening, the situation progress into grand mal seizures and she was subsequently brought to the emergency department for evaluation. I reviewed her history and summarized as follows:   Glioblastoma multiforme of brain [facility])   06/30/2016 - 07/10/2016 Hospital Admission    The patient was admitted to the hospital after presentation with seizure. She was subsequently found to have brain tumor and underwent primary resection. She was subsequently discharged to rehabilitation facility      07/01/2016 Imaging    CT head showed mass lesion within the right frontal lobe measuring up to 4.8 cm. 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 However, a cerebral abscess may have a similar appearance. MRI with and without contrast is recommended for further characterization. 2. No significant mass effect or  midline shift.  No hydrocephalus.      07/01/2016 Imaging    Ct chest showed right parahilar density appears to be atelectasis or possible infiltrate but no mass is identified. There is also streaky bibasilar subsegmental atelectasis and very small pleural effusions. 2. No mediastinal or hilar mass or adenopathy. Scattered lymph nodes are noted.      07/01/2016 Imaging    MRI brain showed Motion degraded examination. 3 x 4.3 x 4.5 cm complex RIGHT frontal lobe mass with imaging characteristics of primary brain tumor. 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 Local edema versus nonenhancing infiltrative tumor results in 2 mm RIGHT to LEFT midline shift. No ventricular entrapment.      07/07/2016 Imaging    Interval RIGHT craniotomy for resection of dominant RIGHT frontal lobe tumor without convincing evidence of residual local disease. Intraventricular extension of blood products without hydrocephalus. 2 mm RIGHT to LEFT midline shift without ventricular entrapment. Residual subcentimeter RIGHT frontal lobe satellite nodule consistent with tumor.      07/07/2016 Surgery    Dr. Kathyrn Sheriff performed stereotactic right frontal craniotomy for resection of tumor       07/07/2016 Pathology Results    Accession: [id number] Brain, for tumor resection, Right Frontal Lobe - GLIOBLASTOMA MULTIFORME WHO GRADE IV/IV. - SEE ONCOLOGY TABLE BELOW. Microscopic Comment ONCOLOGY TABLE - BRAIN AND SPINAL CORD 1. Procedure: Resection 2. Tumor site, including laterality: Right frontal lobe 3. Maximum tumor size (cm): At least 4.8 cm (gross measurement) 4. Histologic type: Glioblastoma multiforme 5. Grade: WHO grade IV/IV 6. Margins (if applicable): Can not be assessed 7. Ancillary studies: Per protocol,  a block will be sent for MGMT and IDH1/2 testing and the results reported      After surgery, she has some evidence of apraxia and left neglect. She also  have weakness on the left side which is improving dramatically with rehabilitation. She denies headache, further seizures, nausea, or speech problems. She has excellent appetite due to steroid treatment. She denies incisional wound pain.  MEDICAL HISTORY:  Past Medical History:  Diagnosis Date  . Anxiety   . Bradycardia    At times with pulse in the 40s  . Brain cancer ([place])    Glioblastoma  . Diarrhea    With blating, improved with gluten-free diet  . Diastolic dysfunction   . Essential hypertension, benign    Always has HTN when at the doctor's office.  . Glioblastoma multiforme of brain ([place]) 07/11/2016  . Gout   . Hyperlipidemia   . Lynch syndrome   . Mild aortic sclerosis ([place])   . Mitral valve problem    Mildly thickened mitral valve  . Mitral valve prolapse    Mild, anterior  . MR (mitral regurgitation)    ECHO 08/10/09 shows mild MR again and normal EF. No significant changes from prior ECHO.  Marland Kitchen Palpitations    Occasional, but are not significant. ECHO 08/20/08 - Normal EF (60%), mildly thickened mitral valve with mild anterior mitral valve prolapse, mild MR, mild aortic sclerosis, grade 1 diastolic dysfunction.  . Proteinuria    Likely secondary to Diabetes    SURGICAL HISTORY: Past Surgical History:  Procedure Laterality Date  . ABDOMINAL HYSTERECTOMY    . APPLICATION OF CRANIAL NAVIGATION Right 07/07/2016   Procedure: APPLICATION OF CRANIAL NAVIGATION;  Surgeon: Consuella Lose, MD;  Location: [facility];  Service: Neurosurgery;  Laterality: Right;  . BREAST LUMPECTOMY Left   . CRANIOTOMY Right 07/07/2016   Procedure: CRANIOTOMY TUMOR EXCISION w/BrainLab;  Surgeon: Consuella Lose, MD;  Location: [facility];  Service: Neurosurgery;  Laterality: Right;  . HEMORROIDECTOMY  2009  . TUBAL LIGATION      SOCIAL HISTORY: Social History   Social History  . Marital status: Married    Spouse name: Virna Livengood  . Number of children: N/A  . Years of education: N/A    Occupational History  . Not on file.   Social History Main Topics  . Smoking status: Never Smoker  . Smokeless tobacco: Never Used  . Alcohol use Yes     Comment: socially  . Drug use: No  . Sexual activity: Not on file   Other Topics Concern  . Not on file   Social History Narrative  . No narrative on file    FAMILY HISTORY: Family History  Problem Relation Age of Onset  . Cancer Mother     lung cancer and colon ca  . Colon cancer Mother   . Cancer Father   . Colon cancer Father 44  . Diabetes Father     ALLERGIES:  is allergic to accupril [quinapril hcl]; ace inhibitors; gluten meal; naproxen; novocain [procaine]; reglan [metoclopramide]; and zoloft [sertraline hcl].  MEDICATIONS:  Current Facility-Administered Medications  Medication Dose Route Frequency Provider Last Rate Last Dose  . acetaminophen (TYLENOL) tablet 650 mg  650 mg Oral Q6H PRN Bary Leriche, PA-C   650 mg at 07/19/16 3419  . allopurinol (ZYLOPRIM) tablet 300 mg  300 mg Oral Daily Bary Leriche, PA-C   300 mg at 07/20/16 0813  . alum & mag hydroxide-simeth (MAALOX/MYLANTA) 200-200-20 MG/5ML suspension 30  mL  30 mL Oral Q4H PRN Bary Leriche, PA-C      . bisacodyl (DULCOLAX) suppository 10 mg  10 mg Rectal Daily PRN Bary Leriche, PA-C      . cholecalciferol (VITAMIN D) tablet 2,000 Units  2,000 Units Oral Daily Bary Leriche, PA-C   2,000 Units at 07/20/16 0539  . colchicine tablet 0.6 mg  0.6 mg Oral Daily PRN Bary Leriche, PA-C      . dexamethasone (DECADRON) tablet 4 mg  4 mg Oral Q8H Pamela S Love, PA-C   4 mg at 07/20/16 1501  . diphenhydrAMINE (BENADRYL) 12.5 MG/5ML elixir 12.5-25 mg  12.5-25 mg Oral Q6H PRN Bary Leriche, PA-C      . famotidine (PEPCID) tablet 20 mg  20 mg Oral BID Bary Leriche, PA-C   20 mg at 07/20/16 7673  . guaiFENesin-dextromethorphan (ROBITUSSIN DM) 100-10 MG/5ML syrup 5-10 mL  5-10 mL Oral Q6H PRN Bary Leriche, PA-C      . hydrALAZINE (APRESOLINE) tablet 25 mg  25 mg  Oral Q8H Pamela S Love, PA-C   25 mg at 07/20/16 1502  . hydrochlorothiazide (MICROZIDE) capsule 12.5 mg  12.5 mg Oral Daily Bary Leriche, PA-C   12.5 mg at 07/20/16 4193  . HYDROcodone-acetaminophen (NORCO/VICODIN) 5-325 MG per tablet 1 tablet  1 tablet Oral Q4H PRN Bary Leriche, PA-C   1 tablet at 07/12/16 0755  . insulin aspart (novoLOG) injection 0-5 Units  0-5 Units Subcutaneous QHS Bary Leriche, PA-C   2 Units at 07/11/16 2112  . insulin aspart (novoLOG) injection 0-9 Units  0-9 Units Subcutaneous TID WC Bary Leriche, PA-C   1 Units at 07/19/16 1220  . levETIRAcetam (KEPPRA) tablet 1,000 mg  1,000 mg Oral BID Bary Leriche, PA-C   1,000 mg at 07/20/16 7902  . losartan (COZAAR) tablet 50 mg  50 mg Oral Daily Bary Leriche, PA-C   50 mg at 07/20/16 4097  . multivitamin with minerals tablet 1 tablet  1 tablet Oral Daily Bary Leriche, PA-C   1 tablet at 07/20/16 3532  . ondansetron (ZOFRAN) tablet 4 mg  4 mg Oral Q4H PRN Bary Leriche, PA-C       Or  . ondansetron [facility]) injection 4 mg  4 mg Intravenous Q4H PRN Pamela S Love, PA-C      . senna-docusate (Senokot-S) tablet 2 tablet  2 tablet Oral BID Pamela S Love, PA-C      . traZODone (DESYREL) tablet 25-50 mg  25-50 mg Oral QHS PRN Bary Leriche, PA-C        REVIEW OF SYSTEMS:   Constitutional: Denies fevers, chills or abnormal night sweats Eyes: Denies blurriness of vision, double vision or watery eyes Ears, nose, mouth, throat, and face: Denies mucositis or sore throat Respiratory: Denies cough, dyspnea or wheezes Cardiovascular: Denies palpitation, chest discomfort or lower extremity swelling Gastrointestinal:  Denies nausea, heartburn or change in bowel habits Skin: Denies abnormal skin rashes Lymphatics: Denies new lymphadenopathy or easy bruising Behavioral/Psych: Mood is stable, no new changes  All other systems were reviewed with the patient and are negative.  PHYSICAL EXAMINATION: ECOG PERFORMANCE STATUS: 1 -  Symptomatic but completely ambulatory  Vitals:   07/20/16 0559 07/20/16 1500  BP: (!) 119/58 124/62  Pulse: 65 70  Resp: 18   Temp: 98.2 F (36.8 C) 98.2 F (36.8 C)   Filed Weights   07/18/16 0429 07/19/16 0456 07/20/16  0559  Weight: 182 lb 8 oz (82.8 kg) 182 lb 15.7 oz (83 kg) 177 lb 11.2 oz (80.6 kg)    GENERAL:alert, no distress and comfortable SKIN: skin color, texture, turgor are normal, no rashes or significant lesions. The scalp appears to be healing well EYES: normal, conjunctiva are pink and non-injected, sclera clear OROPHARYNX:no exudate, no erythema and lips, buccal mucosa, and tongue normal  NECK: supple, thyroid normal size, non-tender, without nodularity LYMPH:  no palpable lymphadenopathy in the cervical, axillary or inguinal LUNGS: clear to auscultation and percussion with normal breathing effort HEART: regular rate & rhythm and no murmurs and no lower extremity edema ABDOMEN:abdomen soft, non-tender and normal bowel sounds Musculoskeletal:no cyanosis of digits and no clubbing  PSYCH: alert & oriented x 3 with fluent speech NEURO: Mild left upper and lower extremity weakness.  LABORATORY DATA:  I have reviewed the data as listed Lab Results  Component Value Date   WBC 15.4 (H) 07/19/2016   HGB 12.0 07/19/2016   HCT 35.7 (L) 07/19/2016   MCV 88.8 07/19/2016   PLT 396 07/19/2016    Recent Labs  07/09/16 1005 07/12/16 0635 07/18/16 0439  NA 137 137 137  K 4.1 4.0 3.7  CL 105 105 103  CO2 19* 18* 23  GLUCOSE 130* 122* 118*  BUN 14 26* 27*  CREATININE 0.69 0.73 0.74  CALCIUM 9.5 9.9 9.4  GFRNONAA >60 >60 >60  GFRAA >60 >60 >60  PROT  --  7.3  --   ALBUMIN  --  3.5  --   AST  --  37  --   ALT  --  66*  --   ALKPHOS  --  69  --   BILITOT  --  0.9  --     RADIOGRAPHIC STUDIES: I have personally reviewed the radiological images as listed and agreed with the findings in the report. Ct Head Wo Contrast  Result Date: 07/01/2016 CLINICAL DATA:   New onset seizures EXAM: CT HEAD WITHOUT CONTRAST TECHNIQUE: Contiguous axial images were obtained from the base of the skull through the vertex without intravenous contrast. COMPARISON:  Brain MRI 08/09/2011 FINDINGS: Brain: There is a focal region of hypoattenuation within the right frontal lobe measuring 4.8 x 2.3 cm with multiple internal septations. There is minimal adjacent edema. There is a focal region of hyperattenuation more posteriorly within the right frontal white matter. There is minimal midline shift, measuring approximately 2 mm. No frank herniation. Basal cisterns are clearly patent. No hydrocephalus. No extra-axial collection. Vascular: Atherosclerotic calcification of the vertebral arteries at the skullbase. Skull: Normal. Negative for fracture or focal lesion. Sinuses/Orbits: No acute finding. Other: None. IMPRESSION: 1. Mass lesion within the right frontal lobe measuring up to 4.8 cm. 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 However, a cerebral abscess may have a similar appearance. MRI with and without contrast is recommended for further characterization. 2. No significant mass effect or midline shift.  No hydrocephalus. Electronically Signed   By: Ulyses Jarred M.D.   On: 07/01/2016 00:23   Ct Chest Wo Contrast  Result Date: 07/01/2016 CLINICAL DATA:  Evaluate right perihilar airspace opacity. EXAM: CT CHEST WITHOUT CONTRAST TECHNIQUE: Multidetector CT imaging of the chest was performed following the standard protocol without IV contrast. COMPARISON:  Chest x-ray 07/01/2016 FINDINGS: Chest wall: No breast masses, supraclavicular or axillary lymphadenopathy. Small scattered lymph nodes are noted. The thyroid gland is grossly normal. Endotracheal tube and NG  tubes are in place. Cardiovascular: The heart is borderline enlarged. No pericardial effusion. The aorta is normal in caliber. Mild tortuosity. No significant atherosclerotic  calcifications. Coronary artery calcifications are noted. Mediastinum/Nodes: Small scattered mediastinal and hilar lymph nodes but no mass or overt adenopathy. The esophagus is grossly normal. Lungs/Pleura: Right upper lobe atelectasis adjacent to the major fissure and also adjacent to the suprahilar region. No masses identified. Very small pleural effusions and streaky areas of subsegmental atelectasis in both lower lung zones. No definite pneumonia. No pneumothorax. Upper Abdomen: No significant upper abdominal findings. Musculoskeletal: No significant bony findings. IMPRESSION: 1. Right parahilar density appears to be atelectasis or possible infiltrate but no mass is identified. There is also streaky bibasilar subsegmental atelectasis and very small pleural effusions. 2. No mediastinal or hilar mass or adenopathy. Scattered lymph nodes are noted. Electronically Signed   By: Marijo Sanes M.D.   On: 07/01/2016 16:29   Mr Jeri Cos ZO Contrast  Result Date: 07/08/2016 CLINICAL DATA:  Status post craniotomy. 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 EXAM: MRI HEAD WITHOUT AND WITH CONTRAST TECHNIQUE: Multiplanar, multiecho pulse sequences of the brain and surrounding structures were obtained without and with intravenous contrast. CONTRAST:  40m MULTIHANCE GADOBENATE DIMEGLUMINE 529 MG/ML IV SOLN COMPARISON:  MRI of the head July 01, 2016 FINDINGS: INTRACRANIAL CONTENTS: Interval RIGHT frontal resection cavity, no residual solid enhancing component within the primary frontal convexity tumor. Intrinsic T1 shortening along the surgical approach with susceptibility artifact extending to RIGHT basal ganglia. Small amount of layering blood products bilateral occipital horns and within the third ventricle. Local mass effect with surrounding vasogenic edema, no convincing evidence of marginal cytotoxic edema. 2 mm RIGHT to LEFT midline shift with partially effaced RIGHT frontal horn of lateral  ventricle, no entrapment. The second subcentimeter peripherally enhancing nodule within the RIGHT posterior frontal centrum semiovale remains. Patchy white matter FLAIR T2 hyperintensities exclusive of the aforementioned abnormality compatible with mild chronic small vessel ischemic disease. Small amount of RIGHT frontal extra-axial blood products. RIGHT dural enhancement consistent with recent surgery. VASCULAR: Normal major intracranial vascular flow voids present at skull base. SKULL AND UPPER CERVICAL SPINE: Status post RIGHT frontal craniotomy. No abnormal sellar expansion. No suspicious calvarial bone marrow signal. Craniocervical junction maintained. RIGHT scalp soft tissue swelling with skin staples. SINUSES/ORBITS: Small LEFT sphenoid sinus air-fluid level. The included ocular globes and orbital contents are non-suspicious. OTHER: None. IMPRESSION: Interval RIGHT craniotomy for resection of dominant RIGHT frontal lobe tumor without convincing evidence of residual local disease. Intraventricular extension of blood products without hydrocephalus. 2 mm RIGHT to LEFT midline shift without ventricular entrapment. Residual subcentimeter RIGHT frontal lobe satellite nodule consistent with tumor. Electronically Signed   By: CElon AlasM.D.   On: 07/08/2016 02:04   Mr Brain W And Wo Contrast  Result Date: 07/01/2016 CLINICAL DATA:  Seizure, follow-up RIGHT frontal lobe mass. History of hypertension and diabetes. EXAM: MRI HEAD WITHOUT AND WITH CONTRAST TECHNIQUE: Multiplanar, multiecho pulse sequences of the brain and surrounding structures were obtained without and with intravenous contrast. CONTRAST:  159mMULTIHANCE GADOBENATE DIMEGLUMINE 529 MG/ML IV SOLN COMPARISON:  CT HEAD June 30, 2016 and MRI head August 09, 2011 FINDINGS: Multiple sequences are moderately or severely motion degraded. INTRACRANIAL CONTENTS: RIGHT frontal lobe cystic and solid 3 x 4.3 x 4.5 cm (AP by transverse by CC) mass  with thickened trabecula, enhancing multinodular solid components and sub cm focus of associated reduced diffusion consistent with hypercellularity. No definite  susceptibility artifact to suggest hemorrhage. Mild T2 bright surrounding signal extends through the genu of the corpus callosum. 2.2 cm posterior to the mass is a second enhancing cystic and solid sub cm mass with increased surrounding enhancement suggesting parasitic vessels. Surrounding FLAIR T2 hyperintense signal. RIGHT frontal mass effect resulting in 2 mm RIGHT to LEFT subfalcine herniation. No atrophy for age. Patchy supratentorial and pontine white matter T2 hyperintensities exclusive of the aforementioned abnormality consistent with chronic small vessel ischemic disease. No abnormal extra-axial fluid collections, extra-axial enhancement or masses. VASCULAR: Normal major intracranial vascular flow voids present at skull base. SKULL AND UPPER CERVICAL SPINE: No abnormal sellar expansion. No suspicious calvarial bone marrow signal. Craniocervical junction maintained. SINUSES/ORBITS: The mastoid air-cells and included paranasal sinuses are well-aerated.The included ocular globes and orbital contents are non-suspicious. OTHER: Secretions in the nasopharynx, life-support lines in place. IMPRESSION: Motion degraded examination. 3 x 4.3 x 4.5 cm complex RIGHT frontal lobe mass with imaging characteristics of primary brain tumor. 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 Local edema versus nonenhancing infiltrative tumor results in 2 mm RIGHT to LEFT midline shift. No ventricular entrapment. These results will be called to the ordering clinician or representative by the Radiologist Assistant, and communication documented in the [employer] Dashboard Electronically Signed   By: Elon Alas M.D.   On: 07/01/2016 16:53   Dg Chest Port 1 View  Result Date: 07/07/2016 CLINICAL DATA:  Central line  placement EXAM: PORTABLE CHEST 1 VIEW COMPARISON:  July 04, 2016 FINDINGS: The new right central line terminates in the central SVC. No pneumothorax. No other interval change. IMPRESSION: New right central line in good position.  No pneumothorax. Electronically Signed   By: Dorise Bullion III M.D   On: 07/07/2016 16:25   Dg Chest Port 1 View  Result Date: 07/04/2016 CLINICAL DATA:  70 year old female fell and hit head 4 days ago. Cough. Subsequent encounter. EXAM: PORTABLE CHEST 1 VIEW COMPARISON:  07/03/2016 chest x-ray.  07/01/2016 chest CT. FINDINGS: Cardiomegaly. Mild central pulmonary vascular prominence without pulmonary edema, infiltrate or pneumothorax. Minimally tortuous aorta. IMPRESSION: Mild central pulmonary vascular prominence without pulmonary edema. Cardiomegaly. Electronically Signed   By: Genia Del M.D.   On: 07/04/2016 07:37   Dg Chest Port 1 View  Result Date: 07/03/2016 CLINICAL DATA:  Respiratory failure EXAM: PORTABLE CHEST 1 VIEW COMPARISON:  July 02, 2016 FINDINGS: ET and NG tubes have been removed. Mild increased interstitial markings in the lungs. No other changes. IMPRESSION: Removal of support apparatus. Suggested mild pulmonary venous congestion. Electronically Signed   By: Dorise Bullion III M.D   On: 07/03/2016 07:29   Dg Chest Port 1 View  Result Date: 07/02/2016 CLINICAL DATA:  Acute respiratory acidosis EXAM: PORTABLE CHEST 1 VIEW COMPARISON:  July 01, 2016 FINDINGS: Stable support apparatus. No pneumothorax. The cardiomediastinal silhouette is stable. No focal infiltrate. IMPRESSION: No active disease. Electronically Signed   By: Dorise Bullion III M.D   On: 07/02/2016 09:26   Dg Chest Port 1 View  Result Date: 07/01/2016 CLINICAL DATA:  Acute onset of shortness of breath. Initial encounter. EXAM: PORTABLE CHEST 1 VIEW COMPARISON:  Chest radiograph performed 10/15/2004 FINDINGS: The patient's endotracheal tube is seen ending 2-3 cm above the carina.  The patient's enteric tube is noted extending below the diaphragm. Right perihilar airspace opacity raises concern for pneumonia. No pleural effusion or pneumothorax is seen. The heart is borderline normal in size. No acute  osseous abnormalities are identified. IMPRESSION: 1. Endotracheal tube seen ending 2-3 cm above the carina. 2. Right perihilar airspace opacity raises concern for pneumonia. Followup PA and lateral chest X-ray is recommended in 3-4 weeks following trial of antibiotic therapy to ensure resolution and exclude underlying malignancy. Electronically Signed   By: Garald Balding M.D.   On: 07/01/2016 03:24   Dg Abd Portable 1v  Result Date: 07/01/2016 CLINICAL DATA:  OG tube placement EXAM: PORTABLE ABDOMEN - 1 VIEW COMPARISON:  None. FINDINGS: Esophageal tube tip projects over the proximal stomach. Upper bowel gas pattern is nonobstructed. IMPRESSION: Esophageal tube tip overlies the proximal stomach Electronically Signed   By: Donavan Foil M.D.   On: 07/01/2016 02:35   Dg Abd Portable 1v  Result Date: 07/01/2016 CLINICAL DATA:  Orogastric tube placement.  Initial encounter. EXAM: PORTABLE ABDOMEN - 1 VIEW COMPARISON:  None. FINDINGS: The patient's enteric tube is noted ending overlying the body of the stomach. The visualized bowel gas pattern is unremarkable. Scattered air and stool filled loops of colon are seen; no abnormal dilatation of small bowel loops is seen to suggest small bowel obstruction. No free intra-abdominal air is identified, though evaluation for free air is limited on a single supine view. The visualized osseous structures are within normal limits; the sacroiliac joints are unremarkable in appearance. The visualized lung bases are essentially clear. IMPRESSION: Enteric tube noted ending overlying the body of the stomach. Electronically Signed   By: Garald Balding M.D.   On: 07/01/2016 02:19    ASSESSMENT & PLAN:  Glioblastoma multiforme, multifocal, status post  resection We reviewed imaging studies together.  We discussed multidisciplinary approach to this disease There is another small focus of disease present on recent imaging. We discussed the role of concurrent chemoradiation therapy after surgery. I will forward her case to the navigator to coordinate outpatient appointments. We discussed briefly expected side effects from treatment I will schedule chemotherapy education class in the new future  Mild left-sided neurological deficit She is improving well on rehabilitation. Continue the same  Possible Lynch syndrome I will discuss this with the geneticist to see if she would qualify for additional testing  Discharge planning She will be discharged at the end of the week and I will see her back in the outpatient clinic.   All questions were answered. The patient knows to call the clinic with any problems, questions or concerns.    Heath Lark, MD 07/20/2016 3:54 PM

## 2016-07-20 NOTE — Progress Notes (Signed)
[place] PHYSICAL MEDICINE & REHABILITATION     PROGRESS NOTE  Subjective/Complaints:  Pt seen standing up this AM working with OT.  She is pleasant and states she slept well overnight.    ROS: Denies CP, SOB, N/V/D.  Objective: Vital Signs: Blood pressure (!) 119/58, pulse 65, temperature 98.2 F (36.8 C), temperature source Oral, resp. rate 18, height 5\' 3"  (1.6 m), weight 80.6 kg (177 lb 11.2 oz), SpO2 98 %. No results found.  Recent Labs  07/19/16 0503  WBC 15.4*  HGB 12.0  HCT 35.7*  PLT 396    Recent Labs  07/18/16 0439  NA 137  K 3.7  CL 103  GLUCOSE 118*  BUN 27*  CREATININE 0.74  CALCIUM 9.4   CBG (last 3)   Recent Labs  07/20/16 0642 07/20/16 1058 07/20/16 1123  GLUCAP 93 123* 107*    Wt Readings from Last 3 Encounters:  07/20/16 80.6 kg (177 lb 11.2 oz)  07/11/16 85 kg (187 lb 6.3 oz)  06/10/13 79.4 kg (175 lb)    Physical Exam:  BP (!) 119/58 (BP Location: Left Arm)   Pulse 65   Temp 98.2 F (36.8 C) (Oral)   Resp 18   Ht 5\' 3"  (1.6 m)   Wt 80.6 kg (177 lb 11.2 oz)   SpO2 98%   BMI 31.48 kg/m  Constitutional: She appears well-developed. NAD. HENT: Normocephalic.  Eyes: EOM are normal. No discharge.  Cardiovascular: RRR.  No JVD. Respiratory: Effort normal and breath sounds normal.  GI: Soft. Bowel sounds are normal.  Musculoskeletal: She exhibits edema. She exhibits no tenderness.  Neurological: She is alert.  Left facial weakness (stable) Motor: RUE: 4/5 throughout  (improving) LUE: 4/5 throughout.  Apraxia LUE. (improving) B/l LE: 4/5 proximal to distal (improving) Skin: Skin is warm and dry.  Right curvilinear scalp incision clean,dry, intact, staples removed.   Psychiatric: Her speech is delayed. She is slowed. She expresses inappropriate judgment (continues to improve).   Assessment/Plan: 1. Functional deficits secondary to GBM which require 3+ hours per day of interdisciplinary therapy in a comprehensive inpatient rehab  setting. Physiatrist is providing close team supervision and 24 hour management of active medical problems listed below. Physiatrist and rehab team continue to assess barriers to discharge/monitor patient progress toward functional and medical goals.  Function:  Bathing Bathing position   Position: Shower  Bathing parts Body parts bathed by patient: Right arm, Left arm, Chest, Abdomen, Front perineal area, Right upper leg, Left upper leg, Buttocks, Right lower leg, Left lower leg Body parts bathed by helper: Back  Bathing assist Assist Level: Supervision or verbal cues      Upper Body Dressing/Undressing Upper body dressing   What is the patient wearing?: Pull over shirt/dress     Pull over shirt/dress - Perfomed by patient: Thread/unthread right sleeve, Thread/unthread left sleeve, Put head through opening, Pull shirt over trunk Pull over shirt/dress - Perfomed by helper: Put head through opening, Thread/unthread left sleeve        Upper body assist Assist Level: Supervision or verbal cues      Lower Body Dressing/Undressing Lower body dressing   What is the patient wearing?: Underwear, Pants, Socks, Shoes Underwear - Performed by patient: Thread/unthread right underwear leg, Thread/unthread left underwear leg, Pull underwear up/down   Pants- Performed by patient: Thread/unthread right pants leg, Thread/unthread left pants leg, Pull pants up/down Pants- Performed by helper: Pull pants up/down   Non-skid slipper socks- Performed by helper: Don/doff  right sock, Don/doff left sock Socks - Performed by patient: Don/doff right sock, Don/doff left sock   Shoes - Performed by patient: Don/doff left shoe, Don/doff right shoe, Fasten right, Fasten left Shoes - Performed by helper: Don/doff right shoe, Don/doff left shoe          Lower body assist Assist for lower body dressing: Supervision or verbal cues      Toileting Toileting Toileting activity did not occur: No continent  bowel/bladder event Toileting steps completed by patient: Adjust clothing prior to toileting, Performs perineal hygiene, Adjust clothing after toileting Toileting steps completed by helper: Adjust clothing prior to toileting Toileting Assistive Devices: Grab bar or rail  Toileting assist Assist level: Supervision or verbal cues   Transfers Chair/bed transfer Chair/bed transfer activity did not occur: Safety/medical concerns Chair/bed transfer method: Ambulatory Chair/bed transfer assist level: Supervision or verbal cues Chair/bed transfer assistive device: Armrests     Locomotion Ambulation     Max distance: 200 Assist level: Supervision or verbal cues   Wheelchair   Type: Manual Max wheelchair distance: 150 Assist Level: Total assistance (Pt < 25%)  Cognition Comprehension Comprehension assist level: Follows basic conversation/direction with extra time/assistive device  Expression Expression assist level: Expresses basic needs/ideas: With extra time/assistive device  Social Interaction Social Interaction assist level: Interacts appropriately 75 - 89% of the time - Needs redirection for appropriate language or to initiate interaction.  Problem Solving Problem solving assist level: Solves basic 50 - 74% of the time/requires cueing 25 - 49% of the time  Memory Memory assist level: Recognizes or recalls 75 - 89% of the time/requires cueing 10 - 24% of the time    Medical Problem List and Plan: 1.  Apraxia, left sided weakness, cognitive deficits, decreased attention as well as deficits in mobility and self care tasks secondary to GBM.   Cont CIR, improving  2.  DVT Prophylaxis/Anticoagulation: Mechanical:  Antiembolism stockings, knee (TED hose) Bilateral lower extremities Sequential compression devices, below knee Bilateral lower extremities 3. Pain Management: tylenol effective.  4. Mood: LCSW to follow for evaluation and support.  5. Neuropsych: This patient is not fully capable  of making decisions on her own behalf. 6. Skin/Wound Care: Monitor incision for healing. Maintain adequate nutritional and hydration status.  7. Fluids/Electrolytes/Nutrition: Monitor I/Os.  BMP within acceptable range on 12/18  Encourage fluid intake 8. New onset seizures: Managed by keppra.  9. Leucocytosis:   Likely reactive due to steroids.   WBCs 15.4 on 12/19  Monitor for signs of infection.  10. HTN: Monitor tid. Continue Apresoline, Cozaar and HCTZ.   Stable 12/20  Monitor with increased activity 11. Steroid induced hyperglycemia: Hgb A1c- 5.5. Will monitor BS ac/hs and use SSI for elevated BS.   Monitor with increased activity  Relatively controlled 12/20 12. H/o gout: - Controlled on allopurinol and colchicine. 13. Acute lower UTI:   Completed macrobid 12/18  LOS (Days) 9 A FACE TO FACE EVALUATION WAS PERFORMED  Diamantina Edinger Lorie Phenix 07/20/2016 11:52 AM

## 2016-07-20 NOTE — Progress Notes (Signed)
Speech Language Pathology Daily Session Note  Patient Details  Name: Crystal Parsons MRN: [id number] Date of Birth: 1946-04-14  Today's Date: 07/20/2016 SLP Individual Time: 0905-1005 SLP Individual Time Calculation (min): 60 min   Short Term Goals: Week 2: SLP Short Term Goal 1 (Week 2): Pt will selectively attend to task in a mildly distracting environment for 5 minutes with supervision verbal cues for redirection.   SLP Short Term Goal 2 (Week 2): Pt will visually scan to the left of midline in 75% of opportunities with mod I.   SLP Short Term Goal 3 (Week 2): Pt will complete semi-complex tasks with min assist verbal cues for functional problem solving.   SLP Short Term Goal 4 (Week 2): Pt will return demonstration of diaphragmatic breathing exercises to improve breath support for speech with supervision.   SLP Short Term Goal 5 (Week 2): Pt will utilize increased vocal intensity to achieve intelligibility at the conversational level with mod I.    Skilled Therapeutic Interventions:  Pt was seen for skilled ST targeting cognitive goals.  SLP facilitated the session with a medication management task to address functional problem solving goals.  Pt initially required max assist for task organization due to perseveration when loading pills into a pill box and demonstrated decreased attention to pills on the left side.  As task progressed and therapist provided education regarding organization and scanning techniques, therapist was able to fade cues to min assist verbal cues.  Discussed recommendation that pt have assistance for medication and financial management at discharge.  Pt returned to room and left in recliner with quick release belt donned and call bell within reach.  Continue per current plan of care.    Function:  Eating Eating                 Cognition Comprehension Comprehension assist level: Follows basic conversation/direction with extra time/assistive device   Expression   Expression assist level: Expresses basic needs/ideas: With extra time/assistive device  Social Interaction Social Interaction assist level: Interacts appropriately 75 - 89% of the time - Needs redirection for appropriate language or to initiate interaction.  Problem Solving Problem solving assist level: Solves basic 50 - 74% of the time/requires cueing 25 - 49% of the time  Memory Memory assist level: Recognizes or recalls 75 - 89% of the time/requires cueing 10 - 24% of the time    Pain Pain Assessment Pain Assessment: No/denies pain  Therapy/Group: Individual Therapy  Kristel Durkee, Selinda Orion 07/20/2016, 11:28 AM

## 2016-07-20 NOTE — Progress Notes (Signed)
Physical Therapy Session Note  Patient Details  Name: Crystal Parsons MRN: [id number] Date of Birth: 1945-08-07  Today's Date: 07/20/2016 PT Individual Time: 1303-1402 PT Individual Time Calculation (min): 59 min    Short Term Goals: Week 2:  PT Short Term Goal 1 (Week 2): = LTG of supervision overall  Skilled Therapeutic Interventions/Progress Updates:  Pt received in recliner with husband present.  Alerted pt and husband to pt's fast progress and possible shortened length of stay.  Husband with multiple questions about f/u with surgeon, D/C routine, equipment, f/u therapy and suggestions for home set up for safety.  Discussed all questions but deferred questions about surgeon and diagnosis to PA.  Husband agreeable to participate in therapy today.  Performed gait training >200' in controlled environment and performed higher level gait with stepping over low obstacles, weaving L and R around obstacles, up/down curb and over compliant surface with supervision except for min A for curb without UE support and verbal cues for wider turning radius around obstacles due to stepping on or over cones-discussed implications in community.  Performed simulated car transfer with supervision.  Performed stair negotiation up/down 4 stairs with R rail with supervision to ascend and light HHA to descend due to continued inattention to LUE; had husband return demonstrate.  Performed floor > mat transfer with supervision.  At end of session pt returned to room and left in recliner with husband present to supervise.    Therapy Documentation Precautions:  Precautions Precautions: Fall Precaution Comments: L neglect Restrictions Weight Bearing Restrictions: No Vital Signs: Therapy Vitals Temp: 98.2 F (36.8 C) Temp Source: Oral Pulse Rate: 70 BP: 124/62 Patient Position (if appropriate): Sitting Oxygen Therapy SpO2: 98 % O2 Device: Not Delivered  See Function Navigator for Current Functional  Status.   Therapy/Group: Individual Therapy  [name] [facility] 07/20/2016, 4:23 PM

## 2016-07-21 ENCOUNTER — Inpatient Hospital Stay (HOSPITAL_COMMUNITY): Payer: Medicare Other | Admitting: Occupational Therapy

## 2016-07-21 ENCOUNTER — Inpatient Hospital Stay (HOSPITAL_COMMUNITY): Payer: Medicare Other | Admitting: Speech Pathology

## 2016-07-21 ENCOUNTER — Inpatient Hospital Stay (HOSPITAL_COMMUNITY): Payer: Medicare Other | Admitting: Physical Therapy

## 2016-07-21 DIAGNOSIS — C711 Malignant neoplasm of frontal lobe: Secondary | ICD-10-CM

## 2016-07-21 LAB — GLUCOSE, CAPILLARY
GLUCOSE-CAPILLARY: 148 mg/dL — AB (ref 65–99)
Glucose-Capillary: 103 mg/dL — ABNORMAL HIGH (ref 65–99)
Glucose-Capillary: 106 mg/dL — ABNORMAL HIGH (ref 65–99)
Glucose-Capillary: 137 mg/dL — ABNORMAL HIGH (ref 65–99)

## 2016-07-21 MED ORDER — DEXAMETHASONE 4 MG PO TABS: 4.0000 mg | ORAL_TABLET | Freq: Three times a day (TID) | ORAL | 0 refills | Status: DC

## 2016-07-21 MED ORDER — HYDRALAZINE HCL 25 MG PO TABS: 25.0000 mg | ORAL_TABLET | Freq: Three times a day (TID) | ORAL | 0 refills | Status: DC

## 2016-07-21 MED ORDER — LOSARTAN POTASSIUM-HCTZ 50-12.5 MG PO TABS: 1.0000 | ORAL_TABLET | Freq: Every day | ORAL | 0 refills | Status: DC

## 2016-07-21 MED ORDER — FAMOTIDINE 20 MG PO TABS: 20.0000 mg | ORAL_TABLET | Freq: Two times a day (BID) | ORAL | 0 refills | Status: DC

## 2016-07-21 MED ORDER — LEVETIRACETAM 1000 MG PO TABS: 1000.0000 mg | ORAL_TABLET | Freq: Two times a day (BID) | ORAL | 0 refills | Status: DC

## 2016-07-21 MED ORDER — SENNOSIDES-DOCUSATE SODIUM 8.6-50 MG PO TABS: 2.0000 | ORAL_TABLET | Freq: Every day | ORAL | 0 refills | Status: AC

## 2016-07-21 NOTE — Plan of Care (Signed)
Problem: RH Expression Communication Goal: LTG Patient will increase speech intelligibility (SLP) LTG: Patient will increase speech intelligibility at word/phrase/conversation level with cues, % of the time (SLP)  Outcome: Not Applicable Date Met: 70/48/88 Not addressed while inpatient to allow focus on cognitive and swallowing goals.

## 2016-07-21 NOTE — Progress Notes (Signed)
Social Work Patient ID: Roney Mans, female   DOB: 02-04-1946, 70 y.o.   MRN: [id number]  Lowella Curb, LCSW Social Worker Signed   Patient Care Conference Date of Service: 07/21/2016  9:59 AM      Hide copied text Hover for attribution information Inpatient RehabilitationTeam Conference and Plan of Care Update Date: 07/20/2016   Time: 2:35 pm      Patient Name: Crystal Parsons      Medical Record Number: [id number]  Date of Birth: 1945/11/23 Sex: Female         Room/Bed: 4W18C/4W18C-01 Payor Info: Payor: MEDICARE / Plan: MEDICARE PART A AND B / Product Type: *No Product type* /     Admitting Diagnosis: Tumore Rejection Crani  Admit Date/Time:  07/11/2016  4:16 PM Admission Comments: No comment available    Primary Diagnosis:  <principal problem not specified> Principal Problem: <principal problem not specified>       Patient Active Problem List    Diagnosis Date Noted  . Polyposis syndrome, familial 07/20/2016  . H/O: hysterectomy 07/20/2016  . Leukocytosis    . Monocytosis    . Acute lower UTI    . GBM (glioblastoma multiforme) ([place])    . Glioblastoma multiforme of brain ([place]) 07/11/2016  . MR (mitral regurgitation)    . Brain tumor ([place])    . Apraxia    . Left-sided weakness    . Steroid-induced hyperglycemia    . Leucocytosis    . Benign essential HTN    . History of gout    . Dysuria    . Acute respiratory failure with hypoxia ([place])    . Brain mass    . Seizure ([place]) 06/30/2016  . Status epilepticus ([place]) 06/30/2016      Expected Discharge Date: Expected Discharge Date: 07/22/16   Team Members Present: Physician leading conference: Dr. Delice Lesch Social Worker Present: Lennart Pall, LCSW Nurse Present: Heather Roberts, RN PT Present: [name], PT OT Present: Benay Pillow, OT SLP Present: Weston Anna, SLP PPS Coordinator present : Daiva Nakayama, RN, CRRN       Current Status/Progress Goal Weekly Team Focus  Medical   Apraxia, left sided weakness,  cognitive deficits, decreased attention as well as deficits in mobility and self care tasks secondary to GBM.  Improve neglect, mobility, endurance  See above   Bowel/Bladder   Incontinence of bladder at times, continent of bowel, LBM 07/17/16, refused intervenmtions at this time  Continence with mod I assist  scheduled toileting   Swallow/Nutrition/ Hydration   Upgraded to regular textures, thin liquids; tolerating wtih mod I use of swallowing precautions   mod I   goals met    ADL's   supervision - min A overall  overall supervision  d/c planning, self care retraining, L NMR, L inattention   Mobility   supervision but min A for stairs  Supervision overall  dynamic gait and balance, attention to L, L NMR   Communication             Safety/Cognition/ Behavioral Observations min assist for semi-complex tasks   supervision-min assist   continue to address awareness of deficits, attention to task, visual scanning to the left, problem solving    Pain   c/o headache today, Tylenol given only once since admission & that was today, has not used PRN vicodin  pain sclae <4  continue to assess q shift & treat as needed   Skin   Incision to right temporal/parietal scalp  healing/no s/s of infection  no new skin breakdown  continue to assess q shift     Rehab Goals Patient on target to meet rehab goals: Yes *See Care Plan and progress notes for long and short-term goals.   Barriers to Discharge: Left neglect, mobility, comorbid conditions   Possible Resolutions to Barriers:  Therapies, follow comorbid conditions   Discharge Planning/Teaching Needs:  Pt to return home with spouse who can provide 24/7 assistance.  Education being completed   Team Discussion:  No significant medical concerns.  Reaching supervision goals and feel she can d/c sooner.  Change d/c date 12/22.  Revisions to Treatment Plan:  Change in d/c date    Continued Need for Acute Rehabilitation Level of Care: The patient requires  daily medical management by a physician with specialized training in physical medicine and rehabilitation for the following conditions: Daily direction of a multidisciplinary physical rehabilitation program to ensure safe treatment while eliciting the highest outcome that is of practical value to the patient.: Yes Daily medical management of patient stability for increased activity during participation in an intensive rehabilitation regime.: Yes Daily analysis of laboratory values and/or radiology reports with any subsequent need for medication adjustment of medical intervention for : Post surgical problems;Other   Christeena Krogh 07/21/2016, 9:59 AM      Lowella Curb, LCSW Social Worker Signed   Patient Care Conference Date of Service: 07/14/2016  3:28 PM      Hide copied text Hover for attribution information Inpatient RehabilitationTeam Conference and Plan of Care Update Date: 07/13/2016   Time: 2:50 PM      Patient Name: Crystal Parsons      Medical Record Number: [id number]  Date of Birth: 1945/08/16 Sex: Female         Room/Bed: 4W18C/4W18C-01 Payor Info: Payor: MEDICARE / Plan: MEDICARE PART A AND B / Product Type: *No Product type* /     Admitting Diagnosis: Tumore Rejection Crani  Admit Date/Time:  07/11/2016  4:16 PM Admission Comments: No comment available    Primary Diagnosis:  <principal problem not specified> Principal Problem: <principal problem not specified>       Patient Active Problem List    Diagnosis Date Noted  . Monocytosis    . Acute lower UTI    . GBM (glioblastoma multiforme) ([place])    . 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 MR (mitral regurgitation)    . Brain tumor ([place])    . Apraxia    . Left-sided weakness    . Steroid-induced hyperglycemia    . Leucocytosis    . Benign essential HTN    . History of gout    . Dysuria    . Acute respiratory failure with hypoxia ([place])    . Brain mass    . Seizure ([place]) 06/30/2016  . Status  epilepticus ([place]) 06/30/2016      Expected Discharge Date: Expected Discharge Date: 07/24/16   Team Members Present: Physician leading conference: Dr. Delice Lesch Social Worker Present: Lennart Pall, LCSW Nurse Present: Dorthula Nettles, RN PT Present: [name], PT OT Present: Benay Pillow, OT SLP Present: Weston Anna, SLP PPS Coordinator present : Daiva Nakayama, RN, CRRN       Current Status/Progress Goal Weekly Team Focus  Medical   Apraxia, left sided weakness, cognitive deficits, decreased attention as well as deficits in mobility and self care tasks secondary to GBM  Improve left neglect, mobility, endurance  See above   Bowel/Bladder  incontinent of bowel & bladder, LBM 07/10/16  to become continent with mod assist  scheduled toileting   Swallow/Nutrition/ Hydration   Dys 3, thin liquids   mod I   trials of regular textures for advancement    ADL's   min A bathing, mod A UB self care, Mod A toileting, mod A functional transfers, MAx A LB self care  overall supervision  self care retraining, L neglect, balance/midline orientation, and pt/family edu   Mobility   Mod A overall  Supervision overall  attention to L, sustained attention to task, L NMR, balance, gait, initiation   Communication   decreased vocal intensity, breathy vocal quality   mod I   diaphragmatic breathing exercises    Safety/Cognition/ Behavioral Observations moderate cognitive impairments   supervision-min assist   left attention, awareness of deficits, attention to task    Pain   rarely c/o pain. Has Tylenol ordered 644m q 6hrs prn, has not used it, also has Vicodin 5/3272mordered q 4hr prn, has udsed it once in the last few days  pain scale less than 4  continue to assess & treat prn   Skin   staples to scalp incision right temporal/parietal area, some ecchymosis  no new areas of skin breakdown  continue to assess q shift     Rehab Goals Patient on target to meet rehab goals: Yes *See Care Plan and  progress notes for long and short-term goals.   Barriers to Discharge: Left neglect, mobility, weakness endurance, UTI   Possible Resolutions to Barriers:  Therapies, abx   Discharge Planning/Teaching Needs:  Pt to return home with spouse who can provide 24/7 assistance.      Team Discussion:  New eval.   New GBM;  Treating UTI but labs improved.  Left inattention, neglect;  Initiation some better today.  Flat affect.  Currently min/mod assist with HHA.  Left side more an issue of inattention than weakness.  Supervision goals.  Revisions to Treatment Plan:  None    Continued Need for Acute Rehabilitation Level of Care: The patient requires daily medical management by a physician with specialized training in physical medicine and rehabilitation for the following conditions: Daily direction of a multidisciplinary physical rehabilitation program to ensure safe treatment while eliciting the highest outcome that is of practical value to the patient.: Yes Daily medical management of patient stability for increased activity during participation in an intensive rehabilitation regime.: Yes Daily analysis of laboratory values and/or radiology reports with any subsequent need for medication adjustment of medical intervention for : Post surgical problems;Other;Wound care problems   Elya Diloreto 07/14/2016, 3:28 PM

## 2016-07-21 NOTE — Consult Note (Signed)
Radiation Oncology         [phone] ________________________________  Initial inpatient Consultation  Name: Crystal Parsons MRN: [id number]  Date: 07/20/16  DOB: [birth year]  [id number] JOSEPH, MD  No ref. provider found   REFERRING PHYSICIAN: No ref. provider found  DIAGNOSIS: The primary encounter diagnosis was Left-sided weakness. A diagnosis of Malignant frontal lobe tumor [facility]) was also pertinent to this visit.    ICD-9-CM ICD-10-CM   1. Left-sided weakness 728.87 R53.1 Ambulatory referral to Physical Medicine Rehab  2. Malignant frontal lobe tumor (HCC) 191.1 C71.1 Ambulatory referral to Physical Medicine Rehab    HISTORY OF PRESENT ILLNESS: Crystal Parsons is a 70 y.o. female seen at the request of Dr. Kathyrn Sheriff for new diagnosis of glioblastoma multiform. The patient had been in her usual state of health with some occasional headaches however a few days before her presentation to the emergency department, she developed what sounds to been a grand mal seizure. She was evaluated at home by EMS, and did not proceed to the emergency room, the reasoning for this is unclear, she then developed persistent symptoms and was taken to the emergency room at which time imaging studies confirmed a 3 x 4.3 x 4.5 cm complex right frontal lobe mass with local edema versus non-infiltrative tumor in 2 mm right to left midline shift. She was taken to the operating room on 07/07/2016 she underwent craniotomy with tumor excision under BrainLab guidance. 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 She's been recovering in [facility] rehabilitation program, and is anticipating discharge home this Friday. We are asked to see her to discuss the role for outpatient treatment of her glioblastoma with radiotherapy.   PREVIOUS RADIATION THERAPY: No  PAST MEDICAL HISTORY:  Past Medical History:  Diagnosis Date  . Anxiety   . Bradycardia    At times with pulse in the 40s  . Brain  cancer ([place])    Glioblastoma  . Diarrhea    With blating, improved with gluten-free diet  . Diastolic dysfunction   . Essential hypertension, benign    Always has HTN when at the doctor's office.  . Glioblastoma multiforme of brain ([place]) 07/11/2016  . Gout   . Hyperlipidemia   . Lynch syndrome   . Mild aortic sclerosis ([place])   . Mitral valve problem    Mildly thickened mitral valve  . Mitral valve prolapse    Mild, anterior  . MR (mitral regurgitation)    ECHO 08/10/09 shows mild MR again and normal EF. No significant changes from prior ECHO.  Marland Kitchen Palpitations    Occasional, but are not significant. ECHO 08/20/08 - Normal EF (60%), mildly thickened mitral valve with mild anterior mitral valve prolapse, mild MR, mild aortic sclerosis, grade 1 diastolic dysfunction.  . Proteinuria    Likely secondary to Diabetes      PAST SURGICAL HISTORY: Past Surgical History:  Procedure Laterality Date  . ABDOMINAL HYSTERECTOMY    . APPLICATION OF CRANIAL NAVIGATION Right 07/07/2016   Procedure: APPLICATION OF CRANIAL NAVIGATION;  Surgeon: Consuella Lose, MD;  Location: [facility];  Service: Neurosurgery;  Laterality: Right;  . BREAST LUMPECTOMY Left   . CRANIOTOMY Right 07/07/2016   Procedure: CRANIOTOMY TUMOR EXCISION w/BrainLab;  Surgeon: Consuella Lose, MD;  Location: [place];  Service: Neurosurgery;  Laterality: Right;  . HEMORROIDECTOMY  2009  . TUBAL LIGATION      FAMILY HISTORY:  Family History  Problem Relation Age of Onset  . Cancer Mother  lung cancer and colon ca  . Colon cancer Mother   . Cancer Father   . Colon cancer Father 1  . Diabetes Father     SOCIAL HISTORY:  Social History   Social History  . Marital status: Married    Spouse name: Kielynn Goin  . Number of children: N/A  . Years of education: N/A   Occupational History  . Not on file.   Social History Main Topics  . Smoking status: Never Smoker  . Smokeless tobacco: Never Used  . Alcohol use Yes      Comment: socially  . Drug use: No  . Sexual activity: Not on file   Other Topics Concern  . Not on file   Social History Narrative  . No narrative on file    ALLERGIES: Accupril [quinapril hcl]; Ace inhibitors; Gluten meal; Naproxen; Novocain [procaine]; Reglan [metoclopramide]; and Zoloft [sertraline hcl]  MEDICATIONS:  Current Facility-Administered Medications  Medication Dose Route Frequency Provider Last Rate Last Dose  . acetaminophen (TYLENOL) tablet 650 mg  650 mg Oral Q6H PRN Bary Leriche, PA-C   650 mg at 07/19/16 [id number]  . allopurinol (ZYLOPRIM) tablet 300 mg  300 mg Oral Daily Bary Leriche, PA-C   300 mg at 07/20/16 0813  . alum & mag hydroxide-simeth (MAALOX/MYLANTA) 200-200-20 MG/5ML suspension 30 mL  30 mL Oral Q4H PRN Bary Leriche, PA-C      . bisacodyl (DULCOLAX) suppository 10 mg  10 mg Rectal Daily PRN Bary Leriche, PA-C      . cholecalciferol (VITAMIN D) tablet 2,000 Units  2,000 Units Oral Daily Bary Leriche, PA-C   2,000 Units at 07/20/16 [id number]  . colchicine tablet 0.6 mg  0.6 mg Oral Daily PRN Bary Leriche, PA-C      . dexamethasone (DECADRON) tablet 4 mg  4 mg Oral Q8H Pamela S Love, PA-C   4 mg at 07/21/16 [id number]  . diphenhydrAMINE (BENADRYL) 12.5 MG/5ML elixir 12.5-25 mg  12.5-25 mg Oral Q6H PRN Bary Leriche, PA-C      . famotidine (PEPCID) tablet 20 mg  20 mg Oral BID Bary Leriche, PA-C   20 mg at 07/20/16 2034  . guaiFENesin-dextromethorphan (ROBITUSSIN DM) 100-10 MG/5ML syrup 5-10 mL  5-10 mL Oral Q6H PRN Bary Leriche, PA-C      . hydrALAZINE (APRESOLINE) tablet 25 mg  25 mg Oral Q8H Pamela S Love, PA-C   25 mg at 07/21/16 [id number]  . hydrochlorothiazide (MICROZIDE) capsule 12.5 mg  12.5 mg Oral Daily Bary Leriche, PA-C   12.5 mg at 07/20/16 [id number]  . HYDROcodone-acetaminophen (NORCO/VICODIN) 5-325 MG per tablet 1 tablet  1 tablet Oral Q4H PRN Bary Leriche, PA-C   1 tablet at 07/12/16 0755  . insulin aspart (novoLOG) injection 0-5 Units  0-5 Units Subcutaneous  QHS Bary Leriche, PA-C   2 Units at 07/11/16 2112  . insulin aspart (novoLOG) injection 0-9 Units  0-9 Units Subcutaneous TID WC Bary Leriche, PA-C   1 Units at 07/19/16 1220  . levETIRAcetam (KEPPRA) tablet 1,000 mg  1,000 mg Oral BID Bary Leriche, PA-C   1,000 mg at 07/20/16 2034  . losartan (COZAAR) tablet 50 mg  50 mg Oral Daily Bary Leriche, PA-C   50 mg at 07/20/16 [id number]  . multivitamin with minerals tablet 1 tablet  1 tablet Oral Daily Bary Leriche, PA-C   1 tablet at 07/20/16 [id number]  . ondansetron (ZOFRAN) tablet  4 mg  4 mg Oral Q4H PRN Bary Leriche, PA-C       Or  . ondansetron [facility]) injection 4 mg  4 mg Intravenous Q4H PRN Bary Leriche, PA-C      . senna-docusate (Senokot-S) tablet 2 tablet  2 tablet Oral BID Bary Leriche, PA-C   2 tablet at 07/20/16 2033  . traZODone (DESYREL) tablet 25-50 mg  25-50 mg Oral QHS PRN Bary Leriche, PA-C        REVIEW OF SYSTEMS:  On review of systems, the patient reports that She feels that she is progressing along quite well. She denies any headaches, blurry vision, double vision, auditory disturbances, nausea or vomiting. She denies any chest pain, shortness of breath, cough, fevers, chills, night sweats, unintended weight changes. She denies any bowel or bladder disturbances, and denies abdominal pain. She denies any new musculoskeletal or joint aches or pains. A complete review of systems is obtained and is otherwise negative.    PHYSICAL EXAM:  Wt Readings from Last 3 Encounters:  07/21/16 178 lb 9.2 oz (81 kg)  07/11/16 187 lb 6.3 oz (85 kg)  06/10/13 175 lb (79.4 kg)   Temp Readings from Last 3 Encounters:  07/21/16 98.9 F (37.2 C) (Oral)  07/11/16 98.4 F (36.9 C) (Oral)  01/27/16 97.7 F (36.5 C)   BP Readings from Last 3 Encounters:  07/21/16 118/62  07/11/16 (!) 139/58  01/27/16 105/62   Pulse Readings from Last 3 Encounters:  07/21/16 69  07/11/16 67  01/27/16 70    Pain Scale 0/10 In general this is a well  appearing Caucasian female in no acute distress. She's alert and oriented x4 and appropriate throughout the examination. Cardiopulmonary assessment is negative for acute distress and she exhibits normal effort.  HEENT reveals that the patient is normocephalic, atraumatic with a well healing craniotomy incision without staples.Marland Kitchen EOMs are intact. PERRLA. Skin is intact without any evidence of gross lesions.  Lower extremities are negative for pretibial pitting edema, deep calf tenderness, cyanosis or clubbing. Neurologically she appears to be intact grossly with less motor function in the left upper and lower extremity however she appears to compensate for this. Intact touch is noted of bilateral upper and lower extremities.   KPS =60  100 - Normal; no complaints; no evidence of disease. 90   - Able to carry on normal activity; minor signs or symptoms of disease. 80   - Normal activity with effort; some signs or symptoms of disease. 65   - Cares for self; unable to carry on normal activity or to do active work. 60   - Requires occasional assistance, but is able to care for most of his personal needs. 50   - Requires considerable assistance and frequent medical care. 56   - Disabled; requires special care and assistance. 19   - Severely disabled; hospital admission is indicated although death not imminent. 64   - Very sick; hospital admission necessary; active supportive treatment necessary. 10   - Moribund; fatal processes progressing rapidly. 0     - Dead  Karnofsky DA, Abelmann Jerome, Craver LS and Burchenal [facility] [phone]) The use of the nitrogen mustards in the palliative treatment of carcinoma: with particular reference to bronchogenic carcinoma Cancer 1 634-56  LABORATORY DATA:  Lab Results  Component Value Date   WBC 15.4 (H) 07/19/2016   HGB 12.0 07/19/2016   HCT 35.7 (L) 07/19/2016   MCV 88.8 07/19/2016   PLT 396  07/19/2016   Lab Results  Component Value Date   NA 137 07/18/2016   K 3.7  07/18/2016   CL 103 07/18/2016   CO2 23 07/18/2016   Lab Results  Component Value Date   ALT 66 (H) 07/12/2016   AST 37 07/12/2016   ALKPHOS 69 07/12/2016   BILITOT 0.9 07/12/2016     RADIOGRAPHY: Ct Head Wo Contrast  Result Date: 07/01/2016 CLINICAL DATA:  New onset seizures EXAM: CT HEAD WITHOUT CONTRAST TECHNIQUE: Contiguous axial images were obtained from the base of the skull through the vertex without intravenous contrast. COMPARISON:  Brain MRI 08/09/2011 FINDINGS: Brain: There is a focal region of hypoattenuation within the right frontal lobe measuring 4.8 x 2.3 cm with multiple internal septations. There is minimal adjacent edema. There is a focal region of hyperattenuation more posteriorly within the right frontal white matter. There is minimal midline shift, measuring approximately 2 mm. No frank herniation. Basal cisterns are clearly patent. No hydrocephalus. No extra-axial collection. Vascular: Atherosclerotic calcification of the vertebral arteries at the skullbase. Skull: Normal. Negative for fracture or focal lesion. Sinuses/Orbits: No acute finding. Other: None. IMPRESSION: 1. Mass lesion within the right frontal lobe measuring up to 4.8 cm. 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 However, a cerebral abscess may have a similar appearance. MRI with and without contrast is recommended for further characterization. 2. No significant mass effect or midline shift.  No hydrocephalus. Electronically Signed   By: Ulyses Jarred M.D.   On: 07/01/2016 00:23   Ct Chest Wo Contrast  Result Date: 07/01/2016 CLINICAL DATA:  Evaluate right perihilar airspace opacity. EXAM: CT CHEST WITHOUT CONTRAST TECHNIQUE: Multidetector CT imaging of the chest was performed following the standard protocol without IV contrast. COMPARISON:  Chest x-ray 07/01/2016 FINDINGS: Chest wall: No breast masses, supraclavicular or axillary lymphadenopathy. Small scattered  lymph nodes are noted. The thyroid gland is grossly normal. Endotracheal tube and NG tubes are in place. Cardiovascular: The heart is borderline enlarged. No pericardial effusion. The aorta is normal in caliber. Mild tortuosity. No significant atherosclerotic calcifications. Coronary artery calcifications are noted. Mediastinum/Nodes: Small scattered mediastinal and hilar lymph nodes but no mass or overt adenopathy. The esophagus is grossly normal. Lungs/Pleura: Right upper lobe atelectasis adjacent to the major fissure and also adjacent to the suprahilar region. No masses identified. Very small pleural effusions and streaky areas of subsegmental atelectasis in both lower lung zones. No definite pneumonia. No pneumothorax. Upper Abdomen: No significant upper abdominal findings. Musculoskeletal: No significant bony findings. IMPRESSION: 1. Right parahilar density appears to be atelectasis or possible infiltrate but no mass is identified. There is also streaky bibasilar subsegmental atelectasis and very small pleural effusions. 2. No mediastinal or hilar mass or adenopathy. Scattered lymph nodes are noted. Electronically Signed   By: Marijo Sanes M.D.   On: 07/01/2016 16:29   Mr Jeri Cos [id number] Contrast  Result Date: 07/08/2016 CLINICAL DATA:  Status post craniotomy. 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 EXAM: MRI HEAD WITHOUT AND WITH CONTRAST TECHNIQUE: Multiplanar, multiecho pulse sequences of the brain and surrounding structures were obtained without and with intravenous contrast. CONTRAST:  40mL MULTIHANCE GADOBENATE DIMEGLUMINE 529 MG/ML IV SOLN COMPARISON:  MRI of the head July 01, 2016 FINDINGS: INTRACRANIAL CONTENTS: Interval RIGHT frontal resection cavity, no residual solid enhancing component within the primary frontal convexity tumor. Intrinsic T1 shortening along the surgical approach with susceptibility artifact extending to RIGHT basal ganglia. Small  amount of layering blood  products bilateral occipital horns and within the third ventricle. Local mass effect with surrounding vasogenic edema, no convincing evidence of marginal cytotoxic edema. 2 mm RIGHT to LEFT midline shift with partially effaced RIGHT frontal horn of lateral ventricle, no entrapment. The second subcentimeter peripherally enhancing nodule within the RIGHT posterior frontal centrum semiovale remains. Patchy white matter FLAIR T2 hyperintensities exclusive of the aforementioned abnormality compatible with mild chronic small vessel ischemic disease. Small amount of RIGHT frontal extra-axial blood products. RIGHT dural enhancement consistent with recent surgery. VASCULAR: Normal major intracranial vascular flow voids present at skull base. SKULL AND UPPER CERVICAL SPINE: Status post RIGHT frontal craniotomy. No abnormal sellar expansion. No suspicious calvarial bone marrow signal. Craniocervical junction maintained. RIGHT scalp soft tissue swelling with skin staples. SINUSES/ORBITS: Small LEFT sphenoid sinus air-fluid level. The included ocular globes and orbital contents are non-suspicious. OTHER: None. IMPRESSION: Interval RIGHT craniotomy for resection of dominant RIGHT frontal lobe tumor without convincing evidence of residual local disease. Intraventricular extension of blood products without hydrocephalus. 2 mm RIGHT to LEFT midline shift without ventricular entrapment. Residual subcentimeter RIGHT frontal lobe satellite nodule consistent with tumor. Electronically Signed   By: Elon Alas M.D.   On: 07/08/2016 02:04   Mr Brain W And Wo Contrast  Result Date: 07/01/2016 CLINICAL DATA:  Seizure, follow-up RIGHT frontal lobe mass. History of hypertension and diabetes. EXAM: MRI HEAD WITHOUT AND WITH CONTRAST TECHNIQUE: Multiplanar, multiecho pulse sequences of the brain and surrounding structures were obtained without and with intravenous contrast. CONTRAST:  56mL MULTIHANCE GADOBENATE DIMEGLUMINE 529 MG/ML  IV SOLN COMPARISON:  CT HEAD June 30, 2016 and MRI head August 09, 2011 FINDINGS: Multiple sequences are moderately or severely motion degraded. INTRACRANIAL CONTENTS: RIGHT frontal lobe cystic and solid 3 x 4.3 x 4.5 cm (AP by transverse by CC) mass with thickened trabecula, enhancing multinodular solid components and sub cm focus of associated reduced diffusion consistent with hypercellularity. No definite susceptibility artifact to suggest hemorrhage. Mild T2 bright surrounding signal extends through the genu of the corpus callosum. 2.2 cm posterior to the mass is a second enhancing cystic and solid sub cm mass with increased surrounding enhancement suggesting parasitic vessels. Surrounding FLAIR T2 hyperintense signal. RIGHT frontal mass effect resulting in 2 mm RIGHT to LEFT subfalcine herniation. No atrophy for age. Patchy supratentorial and pontine white matter T2 hyperintensities exclusive of the aforementioned abnormality consistent with chronic small vessel ischemic disease. No abnormal extra-axial fluid collections, extra-axial enhancement or masses. VASCULAR: Normal major intracranial vascular flow voids present at skull base. SKULL AND UPPER CERVICAL SPINE: No abnormal sellar expansion. No suspicious calvarial bone marrow signal. Craniocervical junction maintained. SINUSES/ORBITS: The mastoid air-cells and included paranasal sinuses are well-aerated.The included ocular globes and orbital contents are non-suspicious. OTHER: Secretions in the nasopharynx, life-support lines in place. IMPRESSION: Motion degraded examination. 3 x 4.3 x 4.5 cm complex RIGHT frontal lobe mass with imaging characteristics of primary brain tumor. 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 Local edema versus nonenhancing infiltrative tumor results in 2 mm RIGHT to LEFT midline shift. No ventricular entrapment. These results will be called to the ordering clinician or  representative by the Radiologist Assistant, and communication documented in the [employer] Dashboard Electronically Signed   By: Elon Alas M.D.   On: 07/01/2016 16:53   Dg Chest Port 1 View  Result Date: 07/07/2016 CLINICAL DATA:  Central line placement EXAM: PORTABLE CHEST 1 VIEW  COMPARISON:  July 04, 2016 FINDINGS: The new right central line terminates in the central SVC. No pneumothorax. No other interval change. IMPRESSION: New right central line in good position.  No pneumothorax. Electronically Signed   By: Dorise Bullion III M.D   On: 07/07/2016 16:25   Dg Chest Port 1 View  Result Date: 07/04/2016 CLINICAL DATA:  70 year old female fell and hit head 4 days ago. Cough. Subsequent encounter. EXAM: PORTABLE CHEST 1 VIEW COMPARISON:  07/03/2016 chest x-ray.  07/01/2016 chest CT. FINDINGS: Cardiomegaly. Mild central pulmonary vascular prominence without pulmonary edema, infiltrate or pneumothorax. Minimally tortuous aorta. IMPRESSION: Mild central pulmonary vascular prominence without pulmonary edema. Cardiomegaly. Electronically Signed   By: Genia Del M.D.   On: 07/04/2016 07:37   Dg Chest Port 1 View  Result Date: 07/03/2016 CLINICAL DATA:  Respiratory failure EXAM: PORTABLE CHEST 1 VIEW COMPARISON:  July 02, 2016 FINDINGS: ET and NG tubes have been removed. Mild increased interstitial markings in the lungs. No other changes. IMPRESSION: Removal of support apparatus. Suggested mild pulmonary venous congestion. Electronically Signed   By: Dorise Bullion III M.D   On: 07/03/2016 07:29   Dg Chest Port 1 View  Result Date: 07/02/2016 CLINICAL DATA:  Acute respiratory acidosis EXAM: PORTABLE CHEST 1 VIEW COMPARISON:  July 01, 2016 FINDINGS: Stable support apparatus. No pneumothorax. The cardiomediastinal silhouette is stable. No focal infiltrate. IMPRESSION: No active disease. Electronically Signed   By: Dorise Bullion III M.D   On: 07/02/2016 09:26   Dg Chest Port 1  View  Result Date: 07/01/2016 CLINICAL DATA:  Acute onset of shortness of breath. Initial encounter. EXAM: PORTABLE CHEST 1 VIEW COMPARISON:  Chest radiograph performed 10/15/2004 FINDINGS: The patient's endotracheal tube is seen ending 2-3 cm above the carina. The patient's enteric tube is noted extending below the diaphragm. Right perihilar airspace opacity raises concern for pneumonia. No pleural effusion or pneumothorax is seen. The heart is borderline normal in size. No acute osseous abnormalities are identified. IMPRESSION: 1. Endotracheal tube seen ending 2-3 cm above the carina. 2. Right perihilar airspace opacity raises concern for pneumonia. Followup PA and lateral chest X-ray is recommended in 3-4 weeks following trial of antibiotic therapy to ensure resolution and exclude underlying malignancy. Electronically Signed   By: Garald Balding M.D.   On: 07/01/2016 03:24   Dg Abd Portable 1v  Result Date: 07/01/2016 CLINICAL DATA:  OG tube placement EXAM: PORTABLE ABDOMEN - 1 VIEW COMPARISON:  None. FINDINGS: Esophageal tube tip projects over the proximal stomach. Upper bowel gas pattern is nonobstructed. IMPRESSION: Esophageal tube tip overlies the proximal stomach Electronically Signed   By: Donavan Foil M.D.   On: 07/01/2016 02:35   Dg Abd Portable 1v  Result Date: 07/01/2016 CLINICAL DATA:  Orogastric tube placement.  Initial encounter. EXAM: PORTABLE ABDOMEN - 1 VIEW COMPARISON:  None. FINDINGS: The patient's enteric tube is noted ending overlying the body of the stomach. The visualized bowel gas pattern is unremarkable. Scattered air and stool filled loops of colon are seen; no abnormal dilatation of small bowel loops is seen to suggest small bowel obstruction. No free intra-abdominal air is identified, though evaluation for free air is limited on a single supine view. The visualized osseous structures are within normal limits; the sacroiliac joints are unremarkable in appearance. The  visualized lung bases are essentially clear. IMPRESSION: Enteric tube noted ending overlying the body of the stomach. Electronically Signed   By: Garald Balding M.D.   On: 07/01/2016 02:19  IMPRESSION/PLAN: 1. 70 y.o. 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 Dr. Tammi Klippel has reviewed the patient's history and imaging studies, and we discussed the pathologic findings regarding her new diagnosis. We anticipate she will discharge from the hospital this Friday, and we will ask her to return to our clinic for simulation in the next week to week and a half. She has been counseled on the role of radiotherapy with concurrent oral chemotherapy by [place] as well as by Dr. Alvy Bimler. We reviewed the delivery and logistics of radiotherapy to this site including discussion of the risks, benefits, short and long-term effects of treatment. The patient is interested in moving forward. Unfortunately had not been able to get in touch with her husband to further delineate this with him. She seems to be able to process the discussions she's had previously with oncology and with Dr. Kathyrn Sheriff we will plan to see her outside of the hospital, and arrange follow-up. 2. Lynch syndrome. The patient has been encouraged by Dr. Elson Areas to consider retesting with genetics given her new diagnosis. We will continue to follow along with this peripherally.  3. Left-sided neurologic deficit, the patient continues to improve with rehabilitation and will continue as she transitions outpatient.    In a visit lasting 70 minutes, greater than 50% of the time was spent face to face, and on floor time coordinating discussing her care and specifically reaching out to her husband on multiple attempts.   Carola Rhine, PAC

## 2016-07-21 NOTE — Progress Notes (Signed)
[facility]     PROGRESS NOTE  Subjective/Complaints:  Pt seen sitting up in her chair this AM, knitting.  She is looking forward to going home tomorrow.  She slept well overnight and denies complaints.    ROS: Denies CP, SOB, N/V/D.  Objective: Vital Signs: Blood pressure (!) 137/55, pulse 74, temperature 97.8 F (36.6 C), temperature source Oral, resp. rate 18, height 5\' 3"  (1.6 m), weight 81 kg (178 lb 9.2 oz), SpO2 98 %. No results found.  Recent Labs  07/19/16 0503  WBC 15.4*  HGB 12.0  HCT 35.7*  PLT 396   No results for input(s): NA, K, CL, GLUCOSE, BUN, CREATININE, CALCIUM in the last 72 hours.  Invalid input(s): CO CBG (last 3)   Recent Labs  07/20/16 2017 07/21/16 0630 07/21/16 1156  GLUCAP 160* 103* 106*    Wt Readings from Last 3 Encounters:  07/21/16 81 kg (178 lb 9.2 oz)  07/11/16 85 kg (187 lb 6.3 oz)  06/10/13 79.4 kg (175 lb)    Physical Exam:  BP (!) 137/55 (BP Location: Right Arm)   Pulse 74   Temp 97.8 F (36.6 C) (Oral)   Resp 18   Ht 5\' 3"  (1.6 m)   Wt 81 kg (178 lb 9.2 oz)   SpO2 98%   BMI 31.63 kg/m  Constitutional: She appears well-developed. NAD. HENT: Normocephalic.  Eyes: EOM are normal. No discharge.  Cardiovascular: RRR.  No JVD. Respiratory: Effort normal and breath sounds normal.  GI: Soft. Bowel sounds are normal.  Musculoskeletal: She exhibits edema. She exhibits no tenderness.  Neurological: She is alert.  Motor: Motor: 4+/5 throughout (R>L) Skin: Skin is warm and dry.  Right curvilinear scalp incision clean,dry, intact, staples removed.   Psychiatric: Her speech is delayed. She is slowed. She expresses inappropriate judgment (all continue to improve).   Assessment/Plan: 1. 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 Physiatrist is providing close team supervision and 24 hour management  of active medical problems listed below. Physiatrist and rehab team continue to assess barriers to discharge/monitor patient progress toward functional and medical goals.  Function:  Bathing Bathing position   Position: Shower  Bathing parts Body parts bathed by patient: Right arm, Left arm, Chest, Abdomen, Front perineal area, Right upper leg, Left upper leg, Buttocks, Right lower leg, Left lower leg Body parts bathed by helper: Back  Bathing assist Assist Level: Supervision or verbal cues      Upper Body Dressing/Undressing Upper body dressing Upper body dressing/undressing activity did not occur:  (per pt report) What is the patient wearing?: Pull over shirt/dress     Pull over shirt/dress - Perfomed by patient: Thread/unthread right sleeve, Thread/unthread left sleeve, Put head through opening, Pull shirt over trunk Pull over shirt/dress - Perfomed by helper: Put head through opening, Thread/unthread left sleeve        Upper body assist Assist Level: Supervision or verbal cues      Lower Body Dressing/Undressing Lower body dressing   What is the patient wearing?: Underwear, Pants, Socks, Shoes Underwear - Performed by patient: Thread/unthread right underwear leg, Thread/unthread left underwear leg, Pull underwear up/down   Pants- Performed by patient: Thread/unthread right pants leg, Thread/unthread left pants leg, Pull pants up/down Pants- Performed by helper: Pull pants up/down   Non-skid slipper socks- Performed by helper: Don/doff right sock, Don/doff left sock Socks - Performed by patient:  Don/doff right sock, Don/doff left sock   Shoes - Performed by patient: Don/doff left shoe, Don/doff right shoe, Fasten right, Fasten left Shoes - Performed by helper: Don/doff right shoe, Don/doff left shoe          Lower body assist Assist for lower body dressing: Supervision or verbal cues      Toileting Toileting Toileting activity did not occur: No continent  bowel/bladder event Toileting steps completed by patient: Adjust clothing prior to toileting, Performs perineal hygiene, Adjust clothing after toileting Toileting steps completed by helper: Adjust clothing prior to toileting Toileting Assistive Devices: Grab bar or rail  Toileting assist Assist level: Supervision or verbal cues   Transfers Chair/bed transfer Chair/bed transfer activity did not occur: Safety/medical concerns Chair/bed transfer method: Ambulatory Chair/bed transfer assist level: Supervision or verbal cues Chair/bed transfer assistive device: Armrests     Locomotion Ambulation     Max distance: 350 Assist level: Supervision or verbal cues   Wheelchair Wheelchair activity did not occur: N/A Type: Manual Max wheelchair distance: 150 Assist Level: Total assistance (Pt < 25%)  Cognition Comprehension Comprehension assist level: Follows basic conversation/direction with extra time/assistive device  Expression Expression assist level: Expresses basic needs/ideas: With extra time/assistive device  Social Interaction Social Interaction assist level: Interacts appropriately 75 - 89% of the time - Needs redirection for appropriate language or to initiate interaction.  Problem Solving Problem solving assist level: Solves basic 75 - 89% of the time/requires cueing 10 - 24% of the time  Memory Memory assist level: Recognizes or recalls 75 - 89% of the time/requires cueing 10 - 24% of the time    Medical Problem List and Plan: 1.  Apraxia, left sided weakness, cognitive deficits, decreased attention as well as deficits in mobility and self care tasks secondary to GBM.   Cont CIR, improving   Pt seen by Onc, Rad/Onc plan to follow up as outpt- appreciate recs 2.  DVT Prophylaxis/Anticoagulation: Mechanical:  Antiembolism stockings, knee (TED hose) Bilateral lower extremities Sequential compression devices, below knee Bilateral lower extremities 3. Pain Management: tylenol  effective.  4. Mood: LCSW to follow for evaluation and support.  5. Neuropsych: This patient is not fully capable of making decisions on her own behalf. 6. Skin/Wound Care: Monitor incision for healing. Maintain adequate nutritional and hydration status.  7. Fluids/Electrolytes/Nutrition: Monitor I/Os.  BMP within acceptable range on 12/18  Encouraged fluid intake 8. New onset seizures: Managed by keppra.  9. Leucocytosis:   Likely reactive due to steroids.   WBCs 15.4 on 12/19  Monitor for signs of infection.  10. HTN: Monitor tid. Continue Apresoline, Cozaar and HCTZ.   Relatively controlled 12/21  Monitor with increased activity 11. Steroid induced hyperglycemia: Hgb A1c- 5.5. Will monitor BS ac/hs and use SSI for elevated BS.   Monitor with increased activity  Relatively controlled 12/21 12. H/o gout: - Controlled on allopurinol and colchicine. 13. Acute lower UTI:   Completed macrobid 12/18  LOS (Days) 10 A FACE TO FACE EVALUATION WAS PERFORMED  Crystal Parsons Lorie Phenix 07/21/2016 3:22 PM

## 2016-07-21 NOTE — Progress Notes (Signed)
Speech Language Pathology Discharge Summary  Patient Details  Name: Crystal Parsons MRN: [id number] Date of Birth: Dec 06, 1945  Today's Date: 07/21/2016 SLP Individual Time: 0904-1000 SLP Individual Time Calculation (min): 56 min    Skilled Therapeutic Interventions:  Pt was seen for skilled ST targeting cognitive goals and family education prior to discharge.  Pt's husband was present for the duration of today's therapy session and remained actively engaged in training.  SLP discussed activities for the home environment to maximize treatment effects for cognitive remediation and compensation.  SLP also provided skilled education regarding distraction management techniques and compensatory strategies for memory, task organization, and visual scanning to the left.  Discussed rationale for recommendation of ongoing ST follow up at next level of care to maximize pt's return to previous level of function.  Also recommended that pt have assistance for medication and financial management at discharge.  All questions were answered to pt's and husband's satisfaction at this time.  Handouts were provided to maximize carryover in the home environment.  Pt was left in recliner with call bell within reach and husband at bedside.  Pt is ready for discharge tomorrow.       Patient has met 5 of 5 long term goals.  Patient to discharge at overall Supervision;Min level.  Reasons goals not met:     Clinical Impression/Discharge Summary:   Pt has made functional gains while inpatient and is discharging having met x out of x short term goals.  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  Pt's diet has been upgraded to Regular  textures and thin liquids which pt is consuming with mod I use of swallowing precautions.  Speech intelligibility goals not address while inpatient to allow focus on cognitive and swallowing goals which appear more limiting to her functional independence.  Pt and family education is complete at this time.  Pt would continue to benefit from ST services on an outpatient basis to continue to address cognitive function.    Care Partner:  Caregiver Able to Provide Assistance: Yes  Type of Caregiver Assistance: Physical;Cognitive  Recommendation:  Home Health SLP;Outpatient SLP;24 hour supervision/assistance  Rationale for SLP Follow Up: Maximize cognitive function and independence;Maximize functional communication;Reduce caregiver burden   Equipment: none recommended by SLP    Reasons for discharge: Discharged from hospital   Patient/Family Agrees with Progress Made and Goals Achieved: Yes   Function:  Eating Eating                 Cognition Comprehension Comprehension assist level: Follows basic conversation/direction with extra time/assistive device  Expression   Expression assist level: Expresses basic needs/ideas: With extra time/assistive device  Social Interaction Social Interaction assist level: Interacts appropriately 75 - 89% of the time - Needs redirection for appropriate language or to initiate interaction.  Problem Solving Problem solving assist level: Solves basic 75 - 89% of the time/requires cueing 10 - 24% of the time  Memory Memory assist level: Recognizes or recalls 75 - 89% of the time/requires cueing 10 - 24% of the time   Emilio Math 07/21/2016, 3:44 PM

## 2016-07-21 NOTE — Progress Notes (Signed)
Physical Therapy Session Note  Patient Details  Name: Crystal Parsons MRN: [id number] Date of Birth: 16-Feb-1946  Today's Date: 07/21/2016 PT Individual Time: 1445-1600 PT Individual Time Calculation (min): 75 min    Short Term Goals: Week 2:  PT Short Term Goal 1 (Week 2): = LTG of supervision overall  Skilled Therapeutic Interventions/Progress Updates:  PT instructed patient in grad day assessment.  Stairs x 12 with 1-2 UE support and Supervision assist from PT for safety. Additional 4 steps with one rail for access to house   Gait through hospital.  And gift shop 357ft, 173ft, 210ft, and 1109ft. Distant supervision provided with all gait train in controled and simulated community environments no LOB noted on level surfaces. Gait training also performed with supervision assist through uneven mulched surface and up/down ramp.   Car transfer with distant supervision for safety and only min cues for reduced use of UE support on door for sit<>stand.  PT instructed patient in picking item off floor with distant supervision assist.   Standing balance/cognitive training to play wii boweling x 10 frames with supervision assist from PT. Pt required tactile cues for learning new game throughout entire time with wii.  Obstacle course to step over uneven surface,weave through 6 cones, step over 4 inch and 2 inch obstacles and ascend/descend 4 inch step. Patient completed x2 with one near LOB, patient able to self correct.   Patient returned to room and left sitting in recliner with call bell in reach and RN present.      Therapy Documentation Precautions:  Precautions Precautions: Fall Precaution Comments: L neglect Restrictions Weight Bearing Restrictions: No    Pain: Pain Assessment Pain Assessment: No/denies pain   See Function Navigator for Current Functional Status.   Therapy/Group: Individual Therapy  Lorie Phenix 07/21/2016, 12:21 PM

## 2016-07-21 NOTE — Discharge Instructions (Signed)
Inpatient Rehab Discharge Instructions  Crystal Parsons Discharge date and time: 07/21/16   Activities/Precautions/ Functional Status: Activity: no lifting, driving, or strenuous exercise till cleared by MD Diet: low fat, low cholesterol diet Wound Care: keep wound clean and dry   Functional status:  ___ No restrictions     ___ Walk up steps independently _X__ 24/7 supervision/assistance   ___ Walk up steps with assistance ___ Intermittent supervision/assistance  ___ Bathe/dress independently ___ Walk with walker     _X__ Bathe/dress with assistance ___ Walk Independently    ___ Shower independently ___ Walk with assistance    ___ Shower with assistance _X__ No alcohol     ___ Return to work/school ________    COMMUNITY REFERRALS UPON DISCHARGE:    Outpatient: PT     OT   ST                   Agency:  [facility]      Phone: [phone]                Appointment Date/Time:  12/27 @ 2:00 am (Speech) - arrive at 1:30                                                                    08/02/16 @ 11:00 (OT) and 11:45 (PT)  Medical Equipment/Items Ordered: bedside commode                                                     Agency/Supplier:  [employer] @ [phone]   GENERAL COMMUNITY RESOURCES FOR PATIENT/FAMILY:  Support Groups:  Available via [facility]       Special Instructions:    My questions have been answered and I understand these instructions. I will adhere to these goals and the provided educational materials after my discharge from the hospital.  Patient/Caregiver Signature _______________________________ Date __________  Clinician Signature _______________________________________ Date __________  Please bring this form and your medication list with you to all your follow-up doctor's appointments.

## 2016-07-21 NOTE — Progress Notes (Signed)
Social Work Patient ID: Crystal Parsons, female   DOB: 1946-02-01, 70 y.o.   MRN: [id number]   Met with pt and spouse yesterday following team conference.  Both very pleased with progress and with change in d/c date to 12/22.  Discussed follow up and agreed on OP txs.  Continue to follow.  Khan Chura, LCSW

## 2016-07-21 NOTE — Progress Notes (Signed)
Occupational Therapy Discharge Summary and OT Intervention  Patient Details  Name: Crystal Parsons MRN: [id number] Date of Birth: August 21, 1945  Today's Date: 07/21/2016 OT Individual Time: 4854-6270 OT Individual Time Calculation (min): 58 min     Patient has met 13 of 13  long term goals due to improved activity tolerance, improved balance, ability to compensate for deficits, improved attention, improved awareness and improved coordination.  Patient to discharge at overall Supervision level.  Patient's care partner is independent to provide the necessary cognitive assistance at discharge.    Reasons goals not met: all goals met  Recommendation:  Patient will benefit from ongoing skilled OT services in outpatient setting to continue to advance functional skills in the area of BADL and iADL.  Equipment: BSC and husband purchased shower chair  Reasons for discharge: treatment goals met  Patient/family agrees with progress made and goals achieved: Yes   OT Intervention:  Upon entering the room, pt seated in recliner chair with husband present during session. Per pt reports she dressed and washed self with supervision only prior to OT arrival. Pt with no c/o pain this session. OT provided pt and caregiver with energy conservation handout as pt will have chem/radiation and therapy appointments once she is discharged. Pt performed ambulating in room without use of device to toilet with supervision from husband. Pt performed hygiene and clothing management with supervision as well. Pt ambulating from room 350; + to front entrance with increased time and supervision to look at christmas tree. Pt needing mod verbal cues to navigate back to room from entrance. Pt returned to recliner chair with call bell and all needed items within reach upon exiting the room.     OT Discharge Precautions/Restrictions Precautions Precautions: Fall Precaution Comments: L neglect Restrictions Weight Bearing  Restrictions: No   Pain Pain Assessment Pain Assessment: No/denies pain     Cognition Overall Cognitive Status: Impaired/Different from baseline Arousal/Alertness: Awake/alert Orientation Level: Oriented X4 Attention: Selective Selective Attention: Impaired Selective Attention Impairment: Functional basic;Verbal basic Memory: Impaired Memory Impairment: Decreased recall of new information Awareness: Impaired Awareness Impairment: Emergent impairment;Intellectual impairment Problem Solving: Impaired Problem Solving Impairment: Functional complex;Functional basic Executive Function: Sequencing;Organizing;Self Monitoring;Self Correcting Sequencing: Impaired Sequencing Impairment: Functional basic;Functional complex Organizing: Impaired Organizing Impairment: Functional basic;Functional complex Self Monitoring: Impaired Self Monitoring Impairment: Functional basic;Functional complex Self Correcting: Impaired Self Correcting Impairment: Functional basic;Functional complex Safety/Judgment: Impaired Sensation Sensation Light Touch: Appears Intact Stereognosis: Not tested Hot/Cold: Not tested Proprioception: Impaired by gross assessment Coordination Gross Motor Movements are Fluid and Coordinated: Not tested Fine Motor Movements are Fluid and Coordinated: Not tested Motor  Motor Motor: Hemiplegia Mobility  Bed Mobility Bed Mobility: Supine to Sit;Sit to Supine Supine to Sit: 6: Modified independent (Device/Increase time) Sit to Supine: 6: Modified independent (Device/Increase time)  Trunk/Postural Assessment  Cervical Assessment Cervical Assessment: Within Functional Limits (improved active rotation to L) Thoracic Assessment Thoracic Assessment: Within Functional Limits Lumbar Assessment Lumbar Assessment: Within Functional Limits Postural Control Postural Control: Deficits on evaluation  Balance Standardized Balance Assessment Standardized Balance Assessment: Berg  Balance Test Berg Balance Test Sit to Stand: Able to stand without using hands and stabilize independently Standing Unsupported: Able to stand safely 2 minutes Sitting with Back Unsupported but Feet Supported on Floor or Stool: Able to sit safely and securely 2 minutes Stand to Sit: Sits safely with minimal use of hands Transfers: Able to transfer safely, minor use of hands Standing Unsupported with Eyes Closed: Able to stand 10 seconds with supervision  Standing Ubsupported with Feet Together: Able to place feet together independently and stand for 1 minute with supervision From Standing, Reach Forward with Outstretched Arm: Can reach confidently >25 cm (10") From Standing Position, Pick up Object from Floor: Able to pick up shoe, needs supervision From Standing Position, Turn to Look Behind Over each Shoulder: Looks behind from both sides and weight shifts well Turn 360 Degrees: Able to turn 360 degrees safely one side only in 4 seconds or less Standing Unsupported, Alternately Place Feet on Step/Stool: Able to stand independently and safely and complete 8 steps in 20 seconds Standing Unsupported, One Foot in Front: Able to plae foot ahead of the other independently and hold 30 seconds Standing on One Leg: Able to lift leg independently and hold 5-10 seconds Total Score: 50 Static Sitting Balance Static Sitting - Level of Assistance: 6: Modified independent (Device/Increase time) Dynamic Sitting Balance Dynamic Sitting - Level of Assistance: 6: Modified independent (Device/Increase time) Static Standing Balance Static Standing - Level of Assistance: 5: Stand by assistance Dynamic Standing Balance Dynamic Standing - Level of Assistance: 5: Stand by assistance Extremity/Trunk Assessment RUE Assessment RUE Assessment: Within Functional Limits LUE Assessment LUE Assessment: Within Functional Limits   See Function Navigator for Current Functional Status.  Darleen Crocker P 07/21/2016,  12:09 PM

## 2016-07-21 NOTE — Progress Notes (Signed)
Physical Therapy Discharge Summary  Patient Details  Name: Crystal Parsons MRN: [id number] Date of Birth: Oct 09, 1945  Today's Date: 07/21/2016   Patient has met 12 of 12 long term goals due to improved activity tolerance, improved balance, improved postural control, increased strength, ability to compensate for deficits, functional use of  left upper extremity and left lower extremity, improved attention, improved awareness and improved coordination.  Patient to discharge at an ambulatory level Supervision.   Patient's care partner is independent to provide the necessary cognitive assistance at discharge.    Recommendation:  Patient will benefit from ongoing skilled PT services in outpatient setting to continue to advance safe functional mobility, address ongoing impairments in L hemiparesis, impaired motor planning, impaired proprioception, impaired cognition and safety, impaired postural control, balance, gait and minimize fall risk.  Equipment: No equipment provided  Reasons for discharge: treatment goals met and discharge from hospital  Patient/family agrees with progress made and goals achieved: Yes  PT Discharge Pain Pain Assessment Pain Assessment: No/denies pain Vision/Perception    L inattention Cognition Orientation Level: Oriented X4 (extra time) Sensation Sensation Light Touch: Appears Intact Stereognosis: Not tested Hot/Cold: Not tested Proprioception: Impaired by gross assessment Coordination Gross Motor Movements are Fluid and Coordinated: Not tested Fine Motor Movements are Fluid and Coordinated: Not tested Motor  Motor Motor: Hemiplegia  Mobility Bed Mobility Bed Mobility: Supine to Sit;Sit to Supine Supine to Sit: 6: Modified independent (Device/Increase time) Sit to Supine: 6: Modified independent (Device/Increase time) Transfers Stand Pivot Transfers: 5: Supervision Locomotion  Ambulation Ambulation/Gait Assistance: 5: Supervision Ambulation  Distance (Feet): 150 Feet Assistive device: None Ambulation/Gait Assistance Details: Visual cues/gestures for precautions/safety;Verbal cues for precautions/safety Gait Gait Pattern: Impaired Gait Pattern: Step-through pattern;Decreased stance time - left;Poor foot clearance - left;Poor foot clearance - right;Decreased step length - left;Decreased step length - right Stairs / Additional Locomotion Stairs: Yes Stairs Assistance: 4: Min guard Stair Management Technique: One rail Right;Alternating pattern;Step to pattern;Forwards Number of Stairs: 8 Height of Stairs: 6 Wheelchair Mobility Wheelchair Mobility: No  Trunk/Postural Assessment  Cervical Assessment Cervical Assessment: Within Functional Limits (improved active rotation to L) Thoracic Assessment Thoracic Assessment: Within Functional Limits Lumbar Assessment Lumbar Assessment: Within Functional Limits Postural Control Postural Control: Deficits on evaluation  Balance Standardized Balance Assessment Standardized Balance Assessment: Berg Balance Test Berg Balance Test Sit to Stand: Able to stand without using hands and stabilize independently Standing Unsupported: Able to stand safely 2 minutes Sitting with Back Unsupported but Feet Supported on Floor or Stool: Able to sit safely and securely 2 minutes Stand to Sit: Sits safely with minimal use of hands Transfers: Able to transfer safely, minor use of hands Standing Unsupported with Eyes Closed: Able to stand 10 seconds with supervision Standing Ubsupported with Feet Together: Able to place feet together independently and stand for 1 minute with supervision From Standing, Reach Forward with Outstretched Arm: Can reach confidently >25 cm (10") From Standing Position, Pick up Object from Floor: Able to pick up shoe, needs supervision From Standing Position, Turn to Look Behind Over each Shoulder: Looks behind from both sides and weight shifts well Turn 360 Degrees: Able to  turn 360 degrees safely one side only in 4 seconds or less Standing Unsupported, Alternately Place Feet on Step/Stool: Able to stand independently and safely and complete 8 steps in 20 seconds Standing Unsupported, One Foot in Front: Able to plae foot ahead of the other independently and hold 30 seconds Standing on One Leg: Able to lift leg  independently and hold 5-10 seconds Total Score: 50 Static Sitting Balance Static Sitting - Level of Assistance: 6: Modified independent (Device/Increase time) Dynamic Sitting Balance Dynamic Sitting - Level of Assistance: 6: Modified independent (Device/Increase time) Static Standing Balance Static Standing - Level of Assistance: 5: Stand by assistance Dynamic Standing Balance Dynamic Standing - Level of Assistance: 5: Stand by assistance Extremity Assessment      RLE Assessment RLE Assessment: Within Functional Limits LLE Assessment LLE Assessment: Exceptions to [facility] (4/5 to 4+/5 proximal to distal)   See Function Navigator for Current Functional Status.  [name] [place] 07/21/2016, 11:30 AM

## 2016-07-21 NOTE — Discharge Summary (Signed)
Physician Discharge Summary  Patient ID: [name] MRN: [id number] DOB/AGE: Sep 16, 1945 70 y.o.  Admit date: 07/11/2016 Discharge date: 07/22/2016  Discharge Diagnoses:  Principal Problem:   Glioblastoma multiforme of brain [facility]) Active Problems:   Apraxia   Left-sided weakness   Acute lower UTI   Leukocytosis   Malignant frontal lobe tumor [facility])   Discharged Condition: stable   Significant Diagnostic Studies: N/A   Labs:  Basic Metabolic Panel: BMP Latest Ref Rng & Units 07/18/2016 07/12/2016 07/09/2016  Glucose 65 - 99 mg/dL 118(H) 122(H) 130(H)  BUN 6 - 20 mg/dL 27(H) 26(H) 14  Creatinine 0.44 - 1.00 mg/dL 0.74 0.73 0.69  Sodium 135 - 145 mmol/L 137 137 137  Potassium 3.5 - 5.1 mmol/L 3.7 4.0 4.1  Chloride 101 - 111 mmol/L 103 105 105  CO2 22 - 32 mmol/L 23 18(L) 19(L)  Calcium 8.9 - 10.3 mg/dL 9.4 9.9 9.5    CBC: CBC Latest Ref Rng & Units 07/19/2016 07/12/2016 07/06/2016  WBC 4.0 - 10.5 K/uL 15.4(H) 17.6(H) 12.1(H)  Hemoglobin 12.0 - 15.0 g/dL 12.0 13.0 13.2  Hematocrit 36.0 - 46.0 % 35.7(L) 38.0 38.0  Platelets 150 - 400 K/uL 396 506(H) 334    CBG:  Recent Labs Lab 07/21/16 0630 07/21/16 1156 07/21/16 1646 07/21/16 2058 07/22/16 0635  GLUCAP 103* 106* 137* 148* 115*    Brief HPI:   Crystal D Comptonis a 69 y.o.femalewith history of HTN, Lynch syndrome, palpitations, recent gout flare who was admitted on 07/01/16 with multiple episodes of seizures and requires intubation in ED for airway protection. 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 She was extubated without difficulty and was noted to have mild LUE weakness, cognitive deficits affecting processing, attention and memory. Surgical intervention recommended by Dr. Kathyrn Sheriff and she underwent stereotactic right frontal crani for tumor resection on 12/7.    Patient and husband deny prior DM or cardiac history. On decadron for management of edema and on keppra for seizures. She has had issues with bradycardia as well as hypoxia post op. Somnolence resolving and patient with resultant apraxia, left sided weakness, cognitive deficits, decreased attention as well as deficits in mobility and self care tasks.  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  CIR recommended for follow up therapy.    Hospital Course: Crystal Parsons was admitted to rehab 07/11/2016 for inpatient therapies to consist of PT, ST and OT at least three hours five days a week. Past admission physiatrist, therapy team and rehab RN have worked together to provide customized collaborative inpatient rehab. She has been seizure free on Keppra and is tolerating current dose without side effects. She continues to have reactive leucocytosis likely due to steroids and this is resolving. Steroid induced hyperglycemia was monitored with achs checks and has been reasonably controlled. She was advised to monitor starches and sweet intake after discharge. She has been afebrile during her stay and blood pressure has been stable. Po intake is good and she is continent of bowel and bladder. Blood pressures have been controlled on home dose medications.   She was found to have E coli UTI and was treated with a week of macrodantin. Check of lytes showed pre-renal azotemia and she was encouraged to increase po fluid intake. Dr. Alvy Bimler has followed up for consult and has discussed role of chemotherapy as well as radiation.  She was set to follow up with radiation oncology for discussion of options after discharge. Crani incision has healed without signs or symptoms of infection and staples were removed without difficulty.  She has made good progress and is at supervision level at discharge. She will continue with outpatient PT, OT and ST at [facility] after discharge.    Rehab course: During patient's  stay in rehab weekly team conferences were held to monitor patient's progress, set goals and discuss barriers to discharge. At admission, patient required max assist with ADL tasks and mod to max assist with mobility. She displayed moderate cognitive impairments, mild oral dysphagia and decreased speech intelligibility. She has had improvement in activity tolerance, balance, postural control, as well as ability to compensate for deficits. She is has had improvement in functional use LUE  and LLE as well as improved awareness. She is able to complete ADL tasks with supervision. She requires supervision for transfers and to ambulate 300'. She is able to to complete basic to semi-complex tasks with min assist to supervision level. Swallow function has improved and she is tolerating regular diet with supervision to maintain compensatory strategies.    Disposition: 01-Home or Self Care   Diet: Regular  Special Instructions: 1. No driving or strenuous activity.  2. No diving, tub baths, swimming or high altitudes.   Discharge Instructions    Ambulatory referral to Physical Medicine Rehab    Complete by:  As directed    Transitional care 1-2 weeks     Allergies as of 07/22/2016      Reactions   Accupril [quinapril Hcl] Itching, Rash   *ACE INHIBITORS*    Ace Inhibitors Itching, Rash   Gluten Meal Other (See Comments)   Patient states she has no intolerance to gluten   Naproxen Itching, Rash   Novocain [procaine] Other (See Comments)   Reglan [metoclopramide] Other (See Comments)   Muscle aches   Zoloft [sertraline Hcl] Anxiety      Medication List    STOP taking these medications   dexamethasone 4 MG/ML injection Commonly known as:  DECADRON   HYDROcodone-acetaminophen 10-325 MG tablet Commonly known as:  NORCO   pravastatin 40 MG tablet Commonly known as:  PRAVACHOL     TAKE these medications   dexamethasone 4 MG tablet Commonly known as:  DECADRON Take 1 tablet (4 mg total)  by mouth every 8 (eight) hours.   famotidine 20 MG tablet Commonly known as:  PEPCID Take 1 tablet (20 mg total) by mouth 2 (two) times daily.   hydrALAZINE 25 MG tablet Commonly known as:  APRESOLINE Take 1 tablet (25 mg total) by mouth every 8 (eight) hours.   levETIRAcetam 1000 MG tablet Commonly known as:  KEPPRA Take 1 tablet (1,000 mg total) by mouth 2 (two) times daily.   losartan-hydrochlorothiazide 50-12.5 MG tablet Commonly known as:  HYZAAR Take 1 tablet by mouth daily.   multivitamin tablet Take 1 tablet by mouth daily.   senna-docusate 8.6-50 MG tablet Commonly known as:  Senokot-S Take 2 tablets by mouth at bedtime.   Vitamin D 2000 units tablet Take 2,000 Units by mouth daily.      Follow-up Information    Ankit Lorie Phenix, MD Follow up.   Specialty:  Physical Medicine and Rehabilitation Why:  office will call you with follow up appointment Contact information: [place] [address] [phone]        Consuella Lose, C, MD Follow up.  Specialty:  Neurosurgery Contact information: [address] [place] [address] [phone]        Irven Shelling, MD Follow up on 08/10/2016.   Specialty:  Internal Medicine Why:  @ 3:00 pm (hospital follow up) Contact information: [address] [place] [address] [place] [address] [phone]        Heath Lark, MD Follow up.   Specialty:  Hematology and Oncology Contact information: [place] [address] [phone]        Tyler Pita, MD Follow up.   Specialty:  Radiation Oncology Contact information: [address] [place] [address] [phone]           Signed: Bary Leriche 07/25/2016, 11:46 PM

## 2016-07-21 NOTE — Patient Care Conference (Signed)
Inpatient RehabilitationTeam Conference and Plan of Care Update Date: 07/20/2016   Time: 2:35 pm    Patient Name: Crystal Parsons      Medical Record Number: [id number]  Date of Birth: 1946/02/16 Sex: Female         Room/Bed: 4W18C/4W18C-01 Payor Info: Payor: MEDICARE / Plan: MEDICARE PART A AND B / Product Type: *No Product type* /    Admitting Diagnosis: Tumore Rejection Crani  Admit Date/Time:  07/11/2016  4:16 PM Admission Comments: No comment available   Primary Diagnosis:  <principal problem not specified> Principal Problem: <principal problem not specified>  Patient Active Problem List   Diagnosis Date Noted  . Polyposis syndrome, familial 07/20/2016  . H/O: hysterectomy 07/20/2016  . Leukocytosis   . Monocytosis   . Acute lower UTI   . GBM (glioblastoma multiforme) ([place])   . Glioblastoma multiforme of brain ([place]) 07/11/2016  . MR (mitral regurgitation)   . Brain tumor ([place])   . Apraxia   . Left-sided weakness   . Steroid-induced hyperglycemia   . Leucocytosis   . Benign essential HTN   . History of gout   . Dysuria   . Acute respiratory failure with hypoxia ([place])   . Brain mass   . Seizure ([place]) 06/30/2016  . Status epilepticus ([place]) 06/30/2016    Expected Discharge Date: Expected Discharge Date: 07/22/16  Team Members Present: Physician leading conference: Dr. Delice Lesch Social Worker Present: Lennart Pall, LCSW Nurse Present: Heather Roberts, RN PT Present: [name], PT OT Present: Benay Pillow, OT SLP Present: Weston Anna, SLP PPS Coordinator present : Daiva Nakayama, RN, CRRN     Current Status/Progress Goal Weekly Team Focus  Medical   Apraxia, left sided weakness, cognitive deficits, decreased attention as well as deficits in mobility and self care tasks secondary to GBM.  Improve neglect, mobility, endurance  See above   Bowel/Bladder   Incontinence of bladder at times, continent of bowel, LBM 07/17/16, refused intervenmtions at this time   Continence with mod I assist  scheduled toileting   Swallow/Nutrition/ Hydration   Upgraded to regular textures, thin liquids; tolerating wtih mod I use of swallowing precautions   mod I   goals met    ADL's   supervision - min A overall  overall supervision  d/c planning, self care retraining, L NMR, L inattention   Mobility   supervision but min A for stairs  Supervision overall  dynamic gait and balance, attention to L, L NMR   Communication             Safety/Cognition/ Behavioral Observations  min assist for semi-complex tasks   supervision-min assist   continue to address awareness of deficits, attention to task, visual scanning to the left, problem solving    Pain   c/o headache today, Tylenol given only once since admission & that was today, has not used PRN vicodin  pain sclae <4  continue to assess q shift & treat as needed   Skin   Incision to right temporal/parietal scalp healing/no s/s of infection  no new skin breakdown  continue to assess q shift    Rehab Goals Patient on target to meet rehab goals: Yes *See Care Plan and progress notes for long and short-term goals.  Barriers to Discharge: Left neglect, mobility, comorbid conditions    Possible Resolutions to Barriers:  Therapies, follow comorbid conditions    Discharge Planning/Teaching Needs:  Pt to return home with spouse who can provide 24/7 assistance.  Education being completed   Team Discussion:  No significant medical concerns.  Reaching supervision goals and feel she can d/c sooner.  Change d/c date 12/22.  Revisions to Treatment Plan:  Change in d/c date   Continued Need for Acute Rehabilitation Level of Care: The patient requires daily medical management by a physician with specialized training in physical medicine and rehabilitation for the following conditions: Daily direction of a multidisciplinary physical rehabilitation program to ensure safe treatment while eliciting the highest outcome that  is of practical value to the patient.: Yes Daily medical management of patient stability for increased activity during participation in an intensive rehabilitation regime.: Yes Daily analysis of laboratory values and/or radiology reports with any subsequent need for medication adjustment of medical intervention for : Post surgical problems;Other  Tavien Chestnut, [place] 07/21/2016, 9:59 AM

## 2016-07-22 ENCOUNTER — Telehealth: Payer: Self-pay | Admitting: Hematology and Oncology

## 2016-07-22 ENCOUNTER — Ambulatory Visit (HOSPITAL_BASED_OUTPATIENT_CLINIC_OR_DEPARTMENT_OTHER): Payer: Medicare Other | Admitting: Hematology and Oncology

## 2016-07-22 ENCOUNTER — Ambulatory Visit
Admission: RE | Admit: 2016-07-22 | Discharge: 2016-07-22 | Disposition: A | Discharge status: Home / Self Care | Payer: Medicare Other | Source: Ambulatory Visit | Attending: Radiation Oncology | Admitting: Radiation Oncology

## 2016-07-22 ENCOUNTER — Telehealth: Payer: Self-pay | Admitting: Pharmacist

## 2016-07-22 VITALS — BP 138/62 | HR 78 | Temp 97.9°F | Resp 18 | Wt 179.0 lb

## 2016-07-22 DIAGNOSIS — Z510 Encounter for antineoplastic radiation therapy: Secondary | ICD-10-CM | POA: Insufficient documentation

## 2016-07-22 DIAGNOSIS — C719 Malignant neoplasm of brain, unspecified: Secondary | ICD-10-CM

## 2016-07-22 DIAGNOSIS — R531 Weakness: Secondary | ICD-10-CM

## 2016-07-22 DIAGNOSIS — I10 Essential (primary) hypertension: Secondary | ICD-10-CM | POA: Diagnosis not present

## 2016-07-22 DIAGNOSIS — C711 Malignant neoplasm of frontal lobe: Secondary | ICD-10-CM

## 2016-07-22 LAB — GLUCOSE, CAPILLARY: Glucose-Capillary: 115 mg/dL — ABNORMAL HIGH (ref 65–99)

## 2016-07-22 MED ORDER — TEMOZOLOMIDE 140 MG PO CAPS: 75.0000 mg/m2/d | ORAL_CAPSULE | Freq: Every day | ORAL | 0 refills | Status: DC

## 2016-07-22 MED ORDER — ONDANSETRON HCL 8 MG PO TABS: 8.0000 mg | ORAL_TABLET | Freq: Three times a day (TID) | ORAL | 3 refills | Status: AC | PRN

## 2016-07-22 MED ORDER — SULFAMETHOXAZOLE-TRIMETHOPRIM 800-160 MG PO TABS: 1.0000 | ORAL_TABLET | ORAL | 11 refills | Status: DC

## 2016-07-22 NOTE — Progress Notes (Signed)
  Radiation Oncology         [phone]) [phone] ________________________________  Name: Crystal Parsons MRN: [id number]  Date: 07/22/2016  DOB: 31-Oct-1945  SIMULATION AND TREATMENT PLANNING NOTE    ICD-9-CM ICD-10-CM   1. Glioblastoma multiforme of brain (HCC) 191.9 C71.9     DIAGNOSIS:  70 yo woman with right frontal multifocal Glioblastoma  NARRATIVE:  The patient was brought to the [facility].  Identity was confirmed.  All relevant records and images related to the planned course of therapy were reviewed.  The patient freely provided informed written consent to proceed with treatment after reviewing the details related to the planned course of therapy. The consent form was witnessed and verified by the simulation staff.  Then, the patient was set-up in a stable reproducible  supine position for radiation therapy.  CT images were obtained.  Surface markings were placed.  The CT images were loaded into the planning software.  Then the planning CT was fused with the MRI by our physics staff.  The target and avoidance structures were contoured.  Treatment planning then occurred.  The radiation prescription was entered and confirmed.  I designed and supervised the construction of a total of one medically necessary complex treatment device consisting of thermoplastic mask used for immobilization.  IMAGE GUIDANCE:  The patient will undergo megavoltage CT imaging prior to each fraction with precision couch adjustments for image guided radiotherapy. The initial tumor volume plus edema plus a 1 cm margin will be treated using helical intensity modulated radiotherapy with 6 megavolt x-rays.  For the boost, the initial tumor volume plus a 1 cm margin was treated using helical intensity modulated radiotherapy with 6 megavolt x-rays  PLAN TYPE:  I have requested : Intensity Modulated Radiotherapy (IMRT) is medically necessary for this case for the following reason:  Critical CNS structure  avoidance - brainstem, optic chiasm, optic nerve.Marland Kitchen  SPECIAL TREATMENT PROCEDURE:  The planned course of therapy using radiation constitutes a special treatment procedure. Special care is required in the management of this patient for the following reasons. This treatment constitutes a Special Treatment Procedure for the following reason: [ Concurrent chemotherapy requiring careful monitoring for increased toxicities of treatment including weekly laboratory values..  The special nature of the planned course of radiotherapy will require increased physician supervision and oversight to ensure patient's safety with optimal treatment outcomes.  PLAN:  The glioblastoma site will be treated to a total dose of 60 Gy in 30 fractions using the following plan:  1.  The initial tumor volume plus edema plus a 1 cm margin was treated to 44 Gy in 22 fractions of 2 Gy  2.  The initial tumor volume plus a 1 cm margin was boosted to 60 Gy with 8 additional fractions of 2 Gy  ________________________________  Sheral Apley Tammi Klippel, M.D.

## 2016-07-22 NOTE — Telephone Encounter (Signed)
Oral Chemotherapy Pharmacist Encounter  Received prescription for temozolomide for patient to take in conjunction with radiation for GBM. Cmet from 12/12 reviewed, CBC from 12/19 reviewed, labs OK for treatment Current medication list in Epic assessed, no DDIs with temozolomide identified.  Prescription will be sent to [facility] for benefits analysis.  Oral Oncology Clinic will continue to follow.  Johny Drilling, PharmD, BCPS, BCOP 07/22/2016  3:55 PM Oral Oncology Clinic [phone]

## 2016-07-22 NOTE — Progress Notes (Signed)
Social Work  Discharge Note  The overall goal for the admission was met for:   Discharge location: Yes - home with husband who can provide 24/7 supervision  Length of Stay: Yes - 11 days  Discharge activity level: Yes - supervision  Home/community participation: Yes  Services provided included: MD, RD, PT, OT, SLP, RN, TR, Pharmacy, Neuropsych and SW  Financial Services: Medicare and Private Insurance: [place]  Follow-up services arranged: Outpatient: PT, OT, ST via [facility], DME: 3n1 commode via [facility] and Patient/Family has no preference for HH/DME agencies  Comments (or additional information):  Patient/Family verbalized understanding of follow-up arrangements: Yes  Individual responsible for coordination of the follow-up plan: pt  Confirmed correct DME delivered: Dawson Hollman 07/22/2016    Georgena Weisheit

## 2016-07-22 NOTE — Telephone Encounter (Signed)
Appointments scheduled per 12/22 LOS. Patient given AVS report and calendars with future scheduled appointments. °

## 2016-07-22 NOTE — Progress Notes (Signed)
Late entry: Pt discharged to home with family. Discharge instructions provided by Algis Liming, PA. All questions answered. Pt verbalized understanding. Pt escorted off unit in w/c by Tammi Klippel, NT.

## 2016-07-22 NOTE — Progress Notes (Signed)
[facility]     PROGRESS NOTE  Subjective/Complaints:  Pt seen sitting up in her chair this AM.  She slept well overnight.  She is looking forward to going home today and spend the holidays at home.    ROS: Denies CP, SOB, N/V/D.  Objective: Vital Signs: Blood pressure 138/62, pulse 78, temperature 97.9 F (36.6 C), temperature source Oral, resp. rate 18, height 5\' 3"  (1.6 m), weight 81.2 kg (179 lb 0.2 oz), SpO2 99 %. No results found. No results for input(s): WBC, HGB, HCT, PLT in the last 72 hours. No results for input(s): NA, K, CL, GLUCOSE, BUN, CREATININE, CALCIUM in the last 72 hours.  Invalid input(s): CO CBG (last 3)   Recent Labs  07/21/16 1646 07/21/16 2058 07/22/16 0635  GLUCAP 137* 148* 115*    Wt Readings from Last 3 Encounters:  07/22/16 81.2 kg (179 lb 0.2 oz)  07/11/16 85 kg (187 lb 6.3 oz)  06/10/13 79.4 kg (175 lb)    Physical Exam:  BP 138/62 (BP Location: Right Arm)   Pulse 78   Temp 97.9 F (36.6 C) (Oral)   Resp 18   Ht 5\' 3"  (1.6 m)   Wt 81.2 kg (179 lb 0.2 oz)   SpO2 99%   BMI 31.71 kg/m  Constitutional: She appears well-developed. NAD. HENT: Normocephalic.  Eyes: EOM are normal. No discharge.  Cardiovascular: RRR.  No JVD. Respiratory: Effort normal and breath sounds normal.  GI: Soft. Bowel sounds are normal.  Musculoskeletal: She exhibits edema. She exhibits no tenderness.  Neurological: She is alert.  Motor: Motor: 4+/5 throughout (R>L, stable) Skin: Skin is warm and dry.  Right curvilinear scalp incision clean,dry, intact removed.   Psychiatric: Her speech is delayed. She is slowed. She expresses inappropriate judgment (all continue to improve).   Assessment/Plan: 1. 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 Physiatrist is providing close team supervision and 24 hour management of active medical  problems listed below. Physiatrist and rehab team continue to assess barriers to discharge/monitor patient progress toward functional and medical goals.  Function:  Bathing Bathing position   Position: Shower  Bathing parts Body parts bathed by patient: Right arm, Left arm, Chest, Abdomen, Front perineal area, Right upper leg, Left upper leg, Buttocks, Right lower leg, Left lower leg Body parts bathed by helper: Back  Bathing assist Assist Level: Supervision or verbal cues      Upper Body Dressing/Undressing Upper body dressing Upper body dressing/undressing activity did not occur:  (per pt report) What is the patient wearing?: Pull over shirt/dress     Pull over shirt/dress - Perfomed by patient: Thread/unthread right sleeve, Thread/unthread left sleeve, Put head through opening, Pull shirt over trunk Pull over shirt/dress - Perfomed by helper: Put head through opening, Thread/unthread left sleeve        Upper body assist Assist Level: Supervision or verbal cues      Lower Body Dressing/Undressing Lower body dressing   What is the patient wearing?: Underwear, Pants, Socks, Shoes Underwear - Performed by patient: Thread/unthread right underwear leg, Thread/unthread left underwear leg, Pull underwear up/down   Pants- Performed by patient: Thread/unthread right pants leg, Thread/unthread left pants leg, Pull pants up/down Pants- Performed by helper: Pull pants up/down   Non-skid slipper socks- Performed by helper: Don/doff right sock, Don/doff left sock Socks - Performed by patient: Don/doff right sock, Don/doff left sock  Shoes - Performed by patient: Don/doff left shoe, Don/doff right shoe, Fasten right, Fasten left Shoes - Performed by helper: Don/doff right shoe, Don/doff left shoe          Lower body assist Assist for lower body dressing: Supervision or verbal cues      Toileting Toileting Toileting activity did not occur: No continent bowel/bladder event Toileting  steps completed by patient: Adjust clothing prior to toileting, Performs perineal hygiene, Adjust clothing after toileting Toileting steps completed by helper: Adjust clothing prior to toileting Toileting Assistive Devices: Grab bar or rail  Toileting assist Assist level: Supervision or verbal cues   Transfers Chair/bed transfer Chair/bed transfer activity did not occur: Safety/medical concerns Chair/bed transfer method: Ambulatory Chair/bed transfer assist level: Supervision or verbal cues Chair/bed transfer assistive device: Armrests     Locomotion Ambulation     Max distance: 350 Assist level: Supervision or verbal cues   Wheelchair Wheelchair activity did not occur: N/A Type: Manual Max wheelchair distance: 150 Assist Level: Total assistance (Pt < 25%)  Cognition Comprehension Comprehension assist level: Follows basic conversation/direction with extra time/assistive device  Expression Expression assist level: Expresses basic needs/ideas: With extra time/assistive device  Social Interaction Social Interaction assist level: Interacts appropriately 75 - 89% of the time - Needs redirection for appropriate language or to initiate interaction.  Problem Solving Problem solving assist level: Solves basic 75 - 89% of the time/requires cueing 10 - 24% of the time  Memory Memory assist level: Recognizes or recalls 75 - 89% of the time/requires cueing 10 - 24% of the time    Medical Problem List and Plan: 1.  Apraxia, left sided weakness, cognitive deficits, decreased attention as well as deficits in mobility and self care tasks secondary to GBM.   D/c today  Will see pt for transitional care management in 1-2 weeks.   Pt seen by Onc, Rad/Onc plan to follow up as outpt- appreciate recs 2.  DVT Prophylaxis/Anticoagulation: Mechanical:  Antiembolism stockings, knee (TED hose) Bilateral lower extremities Sequential compression devices, below knee Bilateral lower extremities 3. Pain  Management: tylenol effective.  4. Mood: LCSW to follow for evaluation and support.  5. Neuropsych: This patient is not fully capable of making decisions on her own behalf. 6. Skin/Wound Care: Monitor incision for healing. Maintain adequate nutritional and hydration status.  7. Fluids/Electrolytes/Nutrition: Monitor I/Os.  BMP within acceptable range on 12/18  Encouraged fluid intake 8. New onset seizures: Managed by keppra.  9. Leucocytosis:   Likely reactive due to steroids.   WBCs 15.4 on 12/19  Monitor for signs of infection.  10. HTN: Monitor tid. Continue Apresoline, Cozaar and HCTZ.   Controlled 12/22  Monitor with increased activity 11. Steroid induced hyperglycemia: Hgb A1c- 5.5. Will monitor BS ac/hs and use SSI for elevated BS.   Monitor with increased activity  Relatively controlled 12/22 12. H/o gout: - Controlled on allopurinol and colchicine. 13. Acute lower UTI:   Completed macrobid 12/18  LOS (Days) 11 A FACE TO FACE EVALUATION WAS PERFORMED  Federick Levene Lorie Phenix 07/22/2016 10:44 AM

## 2016-07-26 ENCOUNTER — Encounter: Payer: Self-pay | Admitting: Hematology and Oncology

## 2016-07-26 ENCOUNTER — Encounter: Payer: Self-pay | Admitting: *Deleted

## 2016-07-26 ENCOUNTER — Encounter (HOSPITAL_COMMUNITY): Payer: Self-pay

## 2016-07-26 ENCOUNTER — Other Ambulatory Visit: Payer: Medicare Other

## 2016-07-26 MED FILL — TEMOZOLOMIDE 140 MG CAPSULE: 140 | 28 days supply | Qty: 28 | Fill #0

## 2016-07-26 NOTE — Progress Notes (Signed)
[facility] OFFICE PROGRESS NOTE  Patient Care Team: Lavone Orn, MD as PCP - General (Internal Medicine)  SUMMARY OF ONCOLOGIC HISTORY:   Glioblastoma multiforme of brain [facility])   06/30/2016 - 07/10/2016 Hospital Admission    The patient was admitted to the hospital after presentation with seizure. She was subsequently found to have brain tumor and underwent primary resection. She was subsequently discharged to rehabilitation facility      07/01/2016 Imaging    CT head showed mass lesion within the right frontal lobe measuring up to 4.8 cm. 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 However, a cerebral abscess may have a similar appearance. MRI with and without contrast is recommended for further characterization. 2. No significant mass effect or midline shift.  No hydrocephalus.      07/01/2016 Imaging    Ct chest showed right parahilar density appears to be atelectasis or possible infiltrate but no mass is identified. There is also streaky bibasilar subsegmental atelectasis and very small pleural effusions. 2. No mediastinal or hilar mass or adenopathy. Scattered lymph nodes are noted.      07/01/2016 Imaging    MRI brain showed Motion degraded examination. 3 x 4.3 x 4.5 cm complex RIGHT frontal lobe mass with imaging characteristics of primary brain tumor. 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 Local edema versus nonenhancing infiltrative tumor results in 2 mm RIGHT to LEFT midline shift. No ventricular entrapment.      07/07/2016 Imaging    Interval RIGHT craniotomy for resection of dominant RIGHT frontal lobe tumor without convincing evidence of residual local disease. Intraventricular extension of blood products without hydrocephalus. 2 mm RIGHT to LEFT midline shift without ventricular entrapment. Residual subcentimeter RIGHT frontal lobe satellite nodule consistent  with tumor.      07/07/2016 Surgery    Dr. Kathyrn Sheriff performed stereotactic right frontal craniotomy for resection of tumor       07/07/2016 Pathology Results    Accession: [id number] Brain, for tumor resection, Right Frontal Lobe - GLIOBLASTOMA MULTIFORME WHO GRADE IV/IV. - SEE ONCOLOGY TABLE BELOW. Microscopic Comment ONCOLOGY TABLE - BRAIN AND SPINAL CORD 1. Procedure: Resection 2. Tumor site, including laterality: Right frontal lobe 3. Maximum tumor size (cm): At least 4.8 cm (gross measurement) 4. Histologic type: Glioblastoma multiforme 5. Grade: WHO grade IV/IV 6. Margins (if applicable): Can not be assessed 7. Ancillary studies: Per protocol, a block will be sent for MGMT and IDH1/2 testing and the results reported       INTERVAL HISTORY: Please see below for problem oriented charting. She returns with family members for discussion of adjuvant treatment. She is recovering nicely from surgery. No recent headache, seizures or progressive neurological deficit  REVIEW OF SYSTEMS:   Constitutional: Denies fevers, chills or abnormal weight loss Eyes: Denies blurriness of vision Ears, nose, mouth, throat, and face: Denies mucositis or sore throat Respiratory: Denies cough, dyspnea or wheezes Cardiovascular: Denies palpitation, chest discomfort or lower extremity swelling Gastrointestinal:  Denies nausea, heartburn or change in bowel habits Skin: Denies abnormal skin rashes Lymphatics: Denies new lymphadenopathy or easy bruising Neurological:Denies numbness, tingling or new weaknesses Behavioral/Psych: Mood is stable, no new changes  All other systems were reviewed with the patient and are negative.  I have reviewed the past medical history, past surgical history, social history and family history with the patient and they are unchanged from previous note.  ALLERGIES:  is  allergic to accupril [quinapril hcl]; ace inhibitors; gluten meal; naproxen; novocain [procaine];  reglan [metoclopramide]; and zoloft [sertraline hcl].  MEDICATIONS:  Current Outpatient Prescriptions  Medication Sig Dispense Refill  . Cholecalciferol (VITAMIN D) 2000 UNITS tablet Take 2,000 Units by mouth daily.    Marland Kitchen dexamethasone (DECADRON) 4 MG tablet Take 1 tablet (4 mg total) by mouth every 8 (eight) hours. 90 tablet 0  . famotidine (PEPCID) 20 MG tablet Take 1 tablet (20 mg total) by mouth 2 (two) times daily. 60 tablet 0  . hydrALAZINE (APRESOLINE) 25 MG tablet Take 1 tablet (25 mg total) by mouth every 8 (eight) hours. 90 tablet 0  . levETIRAcetam (KEPPRA) 1000 MG tablet Take 1 tablet (1,000 mg total) by mouth 2 (two) times daily. 60 tablet 0  . losartan-hydrochlorothiazide (HYZAAR) 50-12.5 MG tablet Take 1 tablet by mouth daily. 30 tablet 0  . Multiple Vitamin (MULTIVITAMIN) tablet Take 1 tablet by mouth daily.    . ondansetron (ZOFRAN) 8 MG tablet Take 1 tablet (8 mg total) by mouth every 8 (eight) hours as needed for nausea. 60 tablet 3  . senna-docusate (SENOKOT-S) 8.6-50 MG tablet Take 2 tablets by mouth at bedtime. 100 tablet 0  . sulfamethoxazole-trimethoprim (BACTRIM DS,SEPTRA DS) 800-160 MG tablet Take 1 tablet by mouth 3 (three) times a week. 12 tablet 11  . temozolomide (TEMODAR) 140 MG capsule Take 1 capsule (140 mg total) by mouth daily. May take on an empty stomach or at bedtime to decrease nausea & vomiting. 42 capsule 0   No current facility-administered medications for this visit.     PHYSICAL EXAMINATION: ECOG PERFORMANCE STATUS: 1 - Symptomatic but completely ambulatory  Vitals:   07/22/16 1400  BP: 138/62  Pulse: 78  Resp: 18  Temp: 97.9 F (36.6 C)   Filed Weights   07/22/16 1400  Weight: 179 lb 0.2 oz (81.2 kg)    GENERAL:alert, no distress and comfortable SKIN: skin color, texture, turgor are normal, no rashes or significant lesions EYES: normal, Conjunctiva are pink and non-injected, sclera clear OROPHARYNX:no exudate, no erythema and lips,  buccal mucosa, and tongue normal  NECK: supple, thyroid normal size, non-tender, without nodularity LYMPH:  no palpable lymphadenopathy in the cervical, axillary or inguinal LUNGS: clear to auscultation and percussion with normal breathing effort HEART: regular rate & rhythm and no murmurs and no lower extremity edema ABDOMEN:abdomen soft, non-tender and normal bowel sounds Musculoskeletal:no cyanosis of digits and no clubbing  NEURO: alert & oriented x 3 with fluent speech, with mild weakness on the left side  LABORATORY DATA:  I have reviewed the data as listed    Component Value Date/Time   NA 137 07/18/2016 0439   K 3.7 07/18/2016 0439   CL 103 07/18/2016 0439   CO2 23 07/18/2016 0439   GLUCOSE 118 (H) 07/18/2016 0439   BUN 27 (H) 07/18/2016 0439   CREATININE 0.74 07/18/2016 0439   CALCIUM 9.4 07/18/2016 0439   PROT 7.3 07/12/2016 0635   ALBUMIN 3.5 07/12/2016 0635   AST 37 07/12/2016 0635   ALT 66 (H) 07/12/2016 0635   ALKPHOS 69 07/12/2016 0635   BILITOT 0.9 07/12/2016 0635   GFRNONAA >60 07/18/2016 0439   GFRAA >60 07/18/2016 0439    No results found for: SPEP, UPEP  Lab Results  Component Value Date   WBC 15.4 (H) 07/19/2016   NEUTROABS 12.7 (H) 07/19/2016   HGB 12.0 07/19/2016   HCT 35.7 (L) 07/19/2016   MCV 88.8 07/19/2016  PLT 396 07/19/2016      Chemistry      Component Value Date/Time   NA 137 07/18/2016 0439   K 3.7 07/18/2016 0439   CL 103 07/18/2016 0439   CO2 23 07/18/2016 0439   BUN 27 (H) 07/18/2016 0439   CREATININE 0.74 07/18/2016 0439      Component Value Date/Time   CALCIUM 9.4 07/18/2016 0439   ALKPHOS 69 07/12/2016 0635   AST 37 07/12/2016 0635   ALT 66 (H) 07/12/2016 0635   BILITOT 0.9 07/12/2016 3818       RADIOGRAPHIC STUDIES: I have personally reviewed the radiological images as listed and agreed with the findings in the report. Ct Head Wo Contrast  Result Date: 07/01/2016 CLINICAL DATA:  New onset seizures EXAM: CT HEAD  WITHOUT CONTRAST TECHNIQUE: Contiguous axial images were obtained from the base of the skull through the vertex without intravenous contrast. COMPARISON:  Brain MRI 08/09/2011 FINDINGS: Brain: There is a focal region of hypoattenuation within the right frontal lobe measuring 4.8 x 2.3 cm with multiple internal septations. There is minimal adjacent edema. There is a focal region of hyperattenuation more posteriorly within the right frontal white matter. There is minimal midline shift, measuring approximately 2 mm. No frank herniation. Basal cisterns are clearly patent. No hydrocephalus. No extra-axial collection. Vascular: Atherosclerotic calcification of the vertebral arteries at the skullbase. Skull: Normal. Negative for fracture or focal lesion. Sinuses/Orbits: No acute finding. Other: None. IMPRESSION: 1. Mass lesion within the right frontal lobe measuring up to 4.8 cm. 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 However, a cerebral abscess may have a similar appearance. MRI with and without contrast is recommended for further characterization. 2. No significant mass effect or midline shift.  No hydrocephalus. Electronically Signed   By: Ulyses Jarred M.D.   On: 07/01/2016 00:23   Ct Chest Wo Contrast  Result Date: 07/01/2016 CLINICAL DATA:  Evaluate right perihilar airspace opacity. EXAM: CT CHEST WITHOUT CONTRAST TECHNIQUE: Multidetector CT imaging of the chest was performed following the standard protocol without IV contrast. COMPARISON:  Chest x-ray 07/01/2016 FINDINGS: Chest wall: No breast masses, supraclavicular or axillary lymphadenopathy. Small scattered lymph nodes are noted. The thyroid gland is grossly normal. Endotracheal tube and NG tubes are in place. Cardiovascular: The heart is borderline enlarged. No pericardial effusion. The aorta is normal in caliber. Mild tortuosity. No significant atherosclerotic calcifications. Coronary artery calcifications  are noted. Mediastinum/Nodes: Small scattered mediastinal and hilar lymph nodes but no mass or overt adenopathy. The esophagus is grossly normal. Lungs/Pleura: Right upper lobe atelectasis adjacent to the major fissure and also adjacent to the suprahilar region. No masses identified. Very small pleural effusions and streaky areas of subsegmental atelectasis in both lower lung zones. No definite pneumonia. No pneumothorax. Upper Abdomen: No significant upper abdominal findings. Musculoskeletal: No significant bony findings. IMPRESSION: 1. Right parahilar density appears to be atelectasis or possible infiltrate but no mass is identified. There is also streaky bibasilar subsegmental atelectasis and very small pleural effusions. 2. No mediastinal or hilar mass or adenopathy. Scattered lymph nodes are noted. Electronically Signed   By: Marijo Sanes M.D.   On: 07/01/2016 16:29   Mr Jeri Cos EX Contrast  Result Date: 07/08/2016 CLINICAL DATA:  Status post craniotomy. 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 EXAM: MRI HEAD WITHOUT AND WITH CONTRAST TECHNIQUE: Multiplanar, multiecho pulse sequences of the brain and surrounding structures were obtained without and with intravenous  contrast. CONTRAST:  28m MULTIHANCE GADOBENATE DIMEGLUMINE 529 MG/ML IV SOLN COMPARISON:  MRI of the head July 01, 2016 FINDINGS: INTRACRANIAL CONTENTS: Interval RIGHT frontal resection cavity, no residual solid enhancing component within the primary frontal convexity tumor. Intrinsic T1 shortening along the surgical approach with susceptibility artifact extending to RIGHT basal ganglia. Small amount of layering blood products bilateral occipital horns and within the third ventricle. Local mass effect with surrounding vasogenic edema, no convincing evidence of marginal cytotoxic edema. 2 mm RIGHT to LEFT midline shift with partially effaced RIGHT frontal horn of lateral ventricle, no entrapment. The second  subcentimeter peripherally enhancing nodule within the RIGHT posterior frontal centrum semiovale remains. Patchy white matter FLAIR T2 hyperintensities exclusive of the aforementioned abnormality compatible with mild chronic small vessel ischemic disease. Small amount of RIGHT frontal extra-axial blood products. RIGHT dural enhancement consistent with recent surgery. VASCULAR: Normal major intracranial vascular flow voids present at skull base. SKULL AND UPPER CERVICAL SPINE: Status post RIGHT frontal craniotomy. No abnormal sellar expansion. No suspicious calvarial bone marrow signal. Craniocervical junction maintained. RIGHT scalp soft tissue swelling with skin staples. SINUSES/ORBITS: Small LEFT sphenoid sinus air-fluid level. The included ocular globes and orbital contents are non-suspicious. OTHER: None. IMPRESSION: Interval RIGHT craniotomy for resection of dominant RIGHT frontal lobe tumor without convincing evidence of residual local disease. Intraventricular extension of blood products without hydrocephalus. 2 mm RIGHT to LEFT midline shift without ventricular entrapment. Residual subcentimeter RIGHT frontal lobe satellite nodule consistent with tumor. Electronically Signed   By: CElon AlasM.D.   On: 07/08/2016 02:04   Mr Brain W And Wo Contrast  Result Date: 07/01/2016 CLINICAL DATA:  Seizure, follow-up RIGHT frontal lobe mass. History of hypertension and diabetes. EXAM: MRI HEAD WITHOUT AND WITH CONTRAST TECHNIQUE: Multiplanar, multiecho pulse sequences of the brain and surrounding structures were obtained without and with intravenous contrast. CONTRAST:  141mMULTIHANCE GADOBENATE DIMEGLUMINE 529 MG/ML IV SOLN COMPARISON:  CT HEAD June 30, 2016 and MRI head August 09, 2011 FINDINGS: Multiple sequences are moderately or severely motion degraded. INTRACRANIAL CONTENTS: RIGHT frontal lobe cystic and solid 3 x 4.3 x 4.5 cm (AP by transverse by CC) mass with thickened trabecula, enhancing  multinodular solid components and sub cm focus of associated reduced diffusion consistent with hypercellularity. No definite susceptibility artifact to suggest hemorrhage. Mild T2 bright surrounding signal extends through the genu of the corpus callosum. 2.2 cm posterior to the mass is a second enhancing cystic and solid sub cm mass with increased surrounding enhancement suggesting parasitic vessels. Surrounding FLAIR T2 hyperintense signal. RIGHT frontal mass effect resulting in 2 mm RIGHT to LEFT subfalcine herniation. No atrophy for age. Patchy supratentorial and pontine white matter T2 hyperintensities exclusive of the aforementioned abnormality consistent with chronic small vessel ischemic disease. No abnormal extra-axial fluid collections, extra-axial enhancement or masses. VASCULAR: Normal major intracranial vascular flow voids present at skull base. SKULL AND UPPER CERVICAL SPINE: No abnormal sellar expansion. No suspicious calvarial bone marrow signal. Craniocervical junction maintained. SINUSES/ORBITS: The mastoid air-cells and included paranasal sinuses are well-aerated.The included ocular globes and orbital contents are non-suspicious. OTHER: Secretions in the nasopharynx, life-support lines in place. IMPRESSION: Motion degraded examination. 3 x 4.3 x 4.5 cm complex RIGHT frontal lobe mass with imaging characteristics of primary brain tumor. 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 Local edema versus nonenhancing infiltrative tumor results in 2 mm RIGHT to LEFT midline shift. No ventricular entrapment. These results will be  called to the ordering clinician or representative by the Radiologist Assistant, and communication documented in the [employer] Dashboard Electronically Signed   By: Elon Alas M.D.   On: 07/01/2016 16:53   Dg Chest Port 1 View  Result Date: 07/07/2016 CLINICAL DATA:  Central line placement EXAM: PORTABLE CHEST 1 VIEW  COMPARISON:  July 04, 2016 FINDINGS: The new right central line terminates in the central SVC. No pneumothorax. No other interval change. IMPRESSION: New right central line in good position.  No pneumothorax. Electronically Signed   By: Dorise Bullion III M.D   On: 07/07/2016 16:25   Dg Chest Port 1 View  Result Date: 07/04/2016 CLINICAL DATA:  70 year old female fell and hit head 4 days ago. Cough. Subsequent encounter. EXAM: PORTABLE CHEST 1 VIEW COMPARISON:  07/03/2016 chest x-ray.  07/01/2016 chest CT. FINDINGS: Cardiomegaly. Mild central pulmonary vascular prominence without pulmonary edema, infiltrate or pneumothorax. Minimally tortuous aorta. IMPRESSION: Mild central pulmonary vascular prominence without pulmonary edema. Cardiomegaly. Electronically Signed   By: Genia Del M.D.   On: 07/04/2016 07:37   Dg Chest Port 1 View  Result Date: 07/03/2016 CLINICAL DATA:  Respiratory failure EXAM: PORTABLE CHEST 1 VIEW COMPARISON:  July 02, 2016 FINDINGS: ET and NG tubes have been removed. Mild increased interstitial markings in the lungs. No other changes. IMPRESSION: Removal of support apparatus. Suggested mild pulmonary venous congestion. Electronically Signed   By: Dorise Bullion III M.D   On: 07/03/2016 07:29   Dg Chest Port 1 View  Result Date: 07/02/2016 CLINICAL DATA:  Acute respiratory acidosis EXAM: PORTABLE CHEST 1 VIEW COMPARISON:  July 01, 2016 FINDINGS: Stable support apparatus. No pneumothorax. The cardiomediastinal silhouette is stable. No focal infiltrate. IMPRESSION: No active disease. Electronically Signed   By: Dorise Bullion III M.D   On: 07/02/2016 09:26   Dg Chest Port 1 View  Result Date: 07/01/2016 CLINICAL DATA:  Acute onset of shortness of breath. Initial encounter. EXAM: PORTABLE CHEST 1 VIEW COMPARISON:  Chest radiograph performed 10/15/2004 FINDINGS: The patient's endotracheal tube is seen ending 2-3 cm above the carina. The patient's enteric tube is noted  extending below the diaphragm. Right perihilar airspace opacity raises concern for pneumonia. No pleural effusion or pneumothorax is seen. The heart is borderline normal in size. No acute osseous abnormalities are identified. IMPRESSION: 1. Endotracheal tube seen ending 2-3 cm above the carina. 2. Right perihilar airspace opacity raises concern for pneumonia. Followup PA and lateral chest X-ray is recommended in 3-4 weeks following trial of antibiotic therapy to ensure resolution and exclude underlying malignancy. Electronically Signed   By: Garald Balding M.D.   On: 07/01/2016 03:24   Dg Abd Portable 1v  Result Date: 07/01/2016 CLINICAL DATA:  OG tube placement EXAM: PORTABLE ABDOMEN - 1 VIEW COMPARISON:  None. FINDINGS: Esophageal tube tip projects over the proximal stomach. Upper bowel gas pattern is nonobstructed. IMPRESSION: Esophageal tube tip overlies the proximal stomach Electronically Signed   By: Donavan Foil M.D.   On: 07/01/2016 02:35   Dg Abd Portable 1v  Result Date: 07/01/2016 CLINICAL DATA:  Orogastric tube placement.  Initial encounter. EXAM: PORTABLE ABDOMEN - 1 VIEW COMPARISON:  None. FINDINGS: The patient's enteric tube is noted ending overlying the body of the stomach. The visualized bowel gas pattern is unremarkable. Scattered air and stool filled loops of colon are seen; no abnormal dilatation of small bowel loops is seen to suggest small bowel obstruction. No free intra-abdominal air is identified, though evaluation for free air  is limited on a single supine view. The visualized osseous structures are within normal limits; the sacroiliac joints are unremarkable in appearance. The visualized lung bases are essentially clear. IMPRESSION: Enteric tube noted ending overlying the body of the stomach. Electronically Signed   By: Garald Balding M.D.   On: 07/01/2016 02:19    ASSESSMENT & PLAN:  Glioblastoma multiforme of brain ([place]) We discussed the role of concurrent chemotherapy and  radiation treatment for management of GBM  The decision was made based on publication at the [facility]. It is a category 1 recommendation from NCCN. Role of treatment is palliative  Radiotherapy plus Concomitant and Adjuvant Temozolomide for Glioblastoma Judson Roch, M.D., Aurther Loft. Cornelia Copa, M.D., Lovenia Shuck, M.D., Gwyneth Revels, M.D., Jori Moll, M.D., Zebedee Iba. Taphoorn, M.D., Lurlean Nanny, M.D., Toma Aran, M.D., Payton Doughty, M.D., Ed Blalock, M.D., Tillie Rung, M.D., Kenney Houseman, M.D., Ace Gins. Karl Pock, M.D., Luiz Blare, M.[place]., Adele Schilder Allgeier, Ph.D., Oswaldo Done, M.D., J. Candelaria Stagers, M.D., Lona Kettle, M.D., and Martin Majestic, M.D., for the [employer] for Research and Treatment of Cancer Brain Tumor and Radiotherapy Groups and the [facility] of [place] Clinical Trials GroupAlta Cor[employer] Med 2005; 332:951-884ZYSAY 10, [place]: 30.1601/[id number]  Methods Patients with newly diagnosed, histologically confirmed glioblastoma were randomly assigned to receive radiotherapy alone (fractionated focal irradiation in daily fractions of 2 Gy given 5 days per week for 6 weeks, for a total of 60 Gy) or radiotherapy plus continuous daily temozolomide (75 mg per square meter of body-surface area per day, 7 days per week from the first to the last day of radiotherapy), followed by six cycles of adjuvant temozolomide (150 to 200 mg per square meter for 5 days during each 28-day cycle). The primary end point was overall survival.  Results A total of 573 patients from 85 centers underwent randomization. The median age was 30 years, and 26 percent of patients had undergone debulking surgery. At a median follow-up of 28 months, the median survival was 14.6 months with radiotherapy plus temozolomide and 12.1 months with radiotherapy alone. The unadjusted hazard ratio for death in the radiotherapy-plus-temozolomide group was 0.63 (95  percent confidence interval, 0.52 to 0.75; P<0.001 by the log-rank test). The two-year survival rate was 26.5 percent with radiotherapy plus temozolomide and 10.4 percent with radiotherapy alone. Concomitant treatment with radiotherapy plus temozolomide resulted in grade 3 or 4 hematologic toxic effects in 7 percent of patients.  Conclusions The addition of temozolomide to radiotherapy for newly diagnosed glioblastoma resulted in a clinically meaningful and statistically significant survival benefit with minimal additional toxicity  Some of the short term side-effects included, though not limited to, risk of fatigue, weight loss, pancytopenia, life-threatening infections, need for transfusions of blood products, nausea, vomiting, change in bowel habits, admission to hospital for various reasons, and risks of death.   The patient is aware that the response rates discussed earlier is not guaranteed.    After a long discussion, patient made an informed decision to proceed with the prescribed plan of care.  We discussed steroid taper. She will reduce steroids to 4 mg twice a day. I will start her on Bactrim for PCP prophylaxis She was started Zofran 30 minutes to an hour before daily radiation treatment I will see her back on the weekly basis for supportive care    Left-sided weakness I recommend aggressive outpatient physical therapy and rehabilitation efforts while on treatment  Benign essential HTN I recommend discontinuation  of hydralazine   No orders of the defined types were placed in this encounter.  All questions were answered. The patient knows to call the clinic with any problems, questions or concerns. No barriers to learning was detected. I spent 40 minutes counseling the patient face to face. The total time spent in the appointment was 55 minutes and more than 50% was on counseling and review of test results     Heath Lark, MD 07/26/2016 8:22 AM

## 2016-07-26 NOTE — Assessment & Plan Note (Signed)
I recommend aggressive outpatient physical therapy and rehabilitation efforts while on treatment

## 2016-07-26 NOTE — Assessment & Plan Note (Signed)
I recommend discontinuation of hydralazine

## 2016-07-26 NOTE — Assessment & Plan Note (Signed)
We discussed the role of concurrent chemotherapy and radiation treatment for management of GBM  The decision was made based on publication at the [facility]. It is a category 1 recommendation from NCCN. Role of treatment is palliative  Radiotherapy plus Concomitant and Adjuvant Temozolomide for Glioblastoma Judson Roch, M.D., Aurther Loft. Cornelia Copa, M.D., Lovenia Shuck, M.D., Gwyneth Revels, M.D., Jori Moll, M.D., Zebedee Iba. Taphoorn, M.D., Lurlean Nanny, M.D., Toma Aran, M.D., Payton Doughty, M.D., Ed Blalock, M.D., Tillie Rung, M.D., Kenney Houseman, M.D., Ace Gins. Karl Pock, M.D., Luiz Blare, M.[place]., Adele Schilder Allgeier, Ph.D., Oswaldo Done, M.D., J. Candelaria Stagers, M.D., Lona Kettle, M.D., and Martin Majestic, M.D., for the [employer] for Research and Treatment of Cancer Brain Tumor and Radiotherapy Groups and the [facility] [place] Clinical Trials GroupAlta Corning Med 2005; [id number], [place]: [id number]  Methods Patients with newly diagnosed, histologically confirmed glioblastoma were randomly assigned to receive radiotherapy alone (fractionated focal irradiation in daily fractions of 2 Gy given 5 days per week for 6 weeks, for a total of 60 Gy) or radiotherapy plus continuous daily temozolomide (75 mg per square meter of body-surface area per day, 7 days per week from the first to the last day of radiotherapy), followed by six cycles of adjuvant temozolomide (150 to 200 mg per square meter for 5 days during each 28-day cycle). The primary end point was overall survival.  Results A total of 573 patients from 85 centers underwent randomization. The median age was 30 years, and 29 percent of patients had undergone debulking surgery. At a median follow-up of 28 months, the median survival was 14.6 months with radiotherapy plus temozolomide and 12.1 months with radiotherapy alone. The unadjusted hazard ratio for death in  the radiotherapy-plus-temozolomide group was 0.63 (95 percent confidence interval, 0.52 to 0.75; P<0.001 by the log-rank test). The two-year survival rate was 26.5 percent with radiotherapy plus temozolomide and 10.4 percent with radiotherapy alone. Concomitant treatment with radiotherapy plus temozolomide resulted in grade 3 or 4 hematologic toxic effects in 7 percent of patients.  Conclusions The addition of temozolomide to radiotherapy for newly diagnosed glioblastoma resulted in a clinically meaningful and statistically significant survival benefit with minimal additional toxicity  Some of the short term side-effects included, though not limited to, risk of fatigue, weight loss, pancytopenia, life-threatening infections, need for transfusions of blood products, nausea, vomiting, change in bowel habits, admission to hospital for various reasons, and risks of death.   The patient is aware that the response rates discussed earlier is not guaranteed.    After a long discussion, patient made an informed decision to proceed with the prescribed plan of care.  We discussed steroid taper. She will reduce steroids to 4 mg twice a day. I will start her on Bactrim for PCP prophylaxis She was started Zofran 30 minutes to an hour before daily radiation treatment I will see her back on the weekly basis for supportive care

## 2016-07-27 ENCOUNTER — Ambulatory Visit: Payer: Medicare Other | Attending: Physical Medicine & Rehabilitation | Admitting: Speech Pathology

## 2016-07-27 ENCOUNTER — Telehealth: Payer: Self-pay | Admitting: *Deleted

## 2016-07-27 DIAGNOSIS — R41841 Cognitive communication deficit: Secondary | ICD-10-CM | POA: Diagnosis not present

## 2016-07-27 NOTE — Telephone Encounter (Addendum)
First attempt to reach for transitional care call. No answer. Left message to call office so that we may discuss follow up appt with Dr Posey Pronto 08/03/16 @9  am arrive by 8:30.  Completed by Glennis Brink.

## 2016-07-27 NOTE — Patient Instructions (Addendum)
   Cognitive Activities you can do at home:   - [place] (easy level)  - [place]  On your computer, tablet or phone: BrainHQ [email or web address] Neuronation App Memory Match Game App [employer]   Use timer to recall day time meds  Keep calendar and look at it in the morning and before bed

## 2016-07-27 NOTE — Therapy (Signed)
[facility] [address] [place], [place], [address] Phone: [phone]   Fax:  [phone]  Speech Language Pathology Evaluation  Patient Details  Name: Crystal Parsons MRN: [id number] Date of Birth: [birth year] Referring Provider: Dr. Delice Lesch  Encounter Date: 07/27/2016      End of Session - 07/27/16 1513    Visit Number 1   Number of Visits 17   Date for SLP Re-Evaluation 09/21/16   Authorization Type Pt also receiving chemo/rad tx - may interfere with ability to schedule tx consistently   SLP Start Time 1408   SLP Stop Time  1457   SLP Time Calculation (min) 49 min      Past Medical History:  Diagnosis Date  . Anxiety   . Bradycardia    At times with pulse in the 40s  . Brain cancer ([place])    Glioblastoma  . Diarrhea    With blating, improved with gluten-free diet  . Diastolic dysfunction   . Essential hypertension, benign    Always has HTN when at the doctor's office.  . Glioblastoma multiforme of brain ([place]) 07/11/2016  . Gout   . Hyperlipidemia   . Lynch syndrome   . Mild aortic sclerosis ([place])   . Mitral valve problem    Mildly thickened mitral valve  . Mitral valve prolapse    Mild, anterior  . MR (mitral regurgitation)    ECHO 08/10/09 shows mild MR again and normal EF. No significant changes from prior ECHO.  Marland Kitchen Palpitations    Occasional, but are not significant. ECHO 08/20/08 - Normal EF (60%), mildly thickened mitral valve with mild anterior mitral valve prolapse, mild MR, mild aortic sclerosis, grade 1 diastolic dysfunction.  . Proteinuria    Likely secondary to Diabetes    Past Surgical History:  Procedure Laterality Date  . ABDOMINAL HYSTERECTOMY    . APPLICATION OF CRANIAL NAVIGATION Right 07/07/2016   Procedure: APPLICATION OF CRANIAL NAVIGATION;  Surgeon: Consuella Lose, MD;  Location: [facility];  Service: Neurosurgery;  Laterality: Right;  . BREAST LUMPECTOMY Left   .  CRANIOTOMY Right 07/07/2016   Procedure: CRANIOTOMY TUMOR EXCISION w/BrainLab;  Surgeon: Consuella Lose, MD;  Location: [facility];  Service: Neurosurgery;  Laterality: Right;  . HEMORROIDECTOMY  2009  . TUBAL LIGATION      There were no vitals filed for this visit.      Subjective Assessment - 07/27/16 1414    Subjective "You look familiar to me"   Patient is accompained by: Family member   Special Tests daughter   Currently in Pain? No/denies            SLP Evaluation [facility] - 07/27/16 1414      SLP Visit Information   SLP Received On 07/27/16   Referring Provider Dr. Delice Lesch   Onset Date Jun 30, 2016   Medical Diagnosis Right frontal lobe neoplasm     Subjective   Subjective "I'm alright"   Patient/Family Stated Goal "I don't know"     General Information   HPI   Crystal Parsons is a 70 y.o. female with history of HTN, Lynch syndrome, palpitations, recent gout flare who was admitted on 07/01/16 with multiple episodes of seizures and requires intubation in ED for airway protection. 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 She was extubated  without difficulty and was noted to have mild LUE weakness, cognitive deficits affecting processing, attention and memory. Surgical intervention recommended by Dr. Kathyrn Sheriff and she underwent stereotactic right frontal crani for tumor resection on 12/7.  Pt received ST on CIR for cognition, swallowing and speech.   Mobility Status walks independently     Prior Functional Status   Cognitive/Linguistic Baseline Within functional limits   Type of Home House    Lives With Spouse   Available Support Family   Vocation Retired     Associate Professor   Overall Cognitive Status Impaired/Different from baseline   Area of Impairment Attention;Memory;Safety/judgement;Awareness;Problem solving   Current  Attention Level Selective   Memory Decreased recall of precautions   Safety/Judgement Decreased awareness of safety;Decreased awareness of deficits   Awareness Intellectual   Problem Solving Difficulty sequencing;Requires verbal cues;Slow processing   Problem Solving Impaired   Problem Solving Impairment Verbal complex;Functional basic   Corporate treasurer;Sequencing;Organizing;Self Correcting   Behaviors Impulsive     Reading Comprehension   Reading Status Within funtional limits     Verbal Expression   Overall Verbal Expression Appears within functional limits for tasks assessed     Written Expression   Dominant Hand Right   Written Expression Within Functional Limits     Oral Motor/Sensory Function   Overall Oral Motor/Sensory Function Appears within functional limits for tasks assessed     Motor Speech   Overall Motor Speech Appears within functional limits for tasks assessed     Standardized Assessments   Standardized Assessments  Montreal Cognitive Assessment [facility])                      ADULT SLP TREATMENT - 07/27/16 1415      General Information   Behavior/Cognition Alert;Cooperative;Pleasant mood           SLP Education - 07/27/16 1513    Education provided Yes   Education Details goals for ST, cognitive activities to do at home   Person(s) Educated Patient;Child(ren)   Methods Explanation;Demonstration;Handout   Comprehension Verbalized understanding;Need further instruction          SLP Short Term Goals - 07/27/16 1526      SLP SHORT TERM GOAL #1   Title Pt will utilize external aids (notebook, timer) to manage schedule, lists, meds with rare min A over 2 sessions   Time 4   Period Weeks   Status New     SLP SHORT TERM GOAL #2   Title Pt will alternate attention between 2 simple cognitive lingusitic tasks with 90% on each and occasional min A   Time 4   Period Weeks   Status New     SLP SHORT TERM GOAL #3   Title Pt  will verbalize 3 cognitive impairments over 2 sessions with rare min A   Time 4   Period Weeks   Status New     SLP SHORT TERM GOAL #4   Title Pt will perform mildly complex reasoning, functional math, attention to detail problems with 85% accuracy and occasional min A   Time 4   Period Weeks   Status New          SLP Long Term Goals - 07/27/16 1528      SLP LONG TERM GOAL #1   Title Pt will demonstrate anticiaptory awareness self correcting errors on cognitive tasks with occasional min A   Time 8   Period Weeks   Status New  SLP LONG TERM GOAL #2   Title Pt will divide attention between 2 simple cognitive lingusitic tasks with 85% on each and occasional min A   Time 8   Period Weeks   Status New     SLP LONG TERM GOAL #3   Title Pt will solve moderately complex reasoning, organizing and attention to detail problems with 90% accuracy and rare min A   Time 8   Period Weeks   Status New          Plan - 07/27/16 1514    Clinical Impression Statement Mrs. Wilk, 70 y.o. female is s/p right frontal tumor rescection/craniotomy. She is referred for ST by rehab physician due to cognitive  impairments. Today she presents with moderate higher level cognitive deficits. The Montreal Cognitive Assessment (MoCA) was administered. Mrs. Coomer scored a 20/30 with 26/30 being [facility]. She demonstrated executive function impairments of organization, analyzing, problem solving on trail making task, figure copyings and abstract reasoning. Mrs. Robinette with reduced short term memory, recalling 4/5 words immediately and 2/5 words with a delay. Her daughter reports that Mrs. Beaulac requires reminders to take her mid day meds. Reduced selective attention noted on series 7 subtraction and informal check writing task. Mrs. Leyda required verbal cues to return to check task after simple conversation distraction. She left date blank on check and did not attend to details in instructions. Mrs.  Dye is oriented with mod I. She demonstrates reduced awareness of her cognitive impairments and states if left alone, she would try to cook. Her daughter also reports safety concerns if pt were to be left alone.  Prior to hospitalization, she kept the family  finances in order, enjoyed cooking and needle point.  She was responsible for shopping for her household. I recommend skilled ST to maximize pt's cognition for improved safety, independence and to reduce spouse's burden.  She is starting chemo and rad tx next week. This may interfere with therapy scheduling/progress. We will work with the family and monitor pt's progress as needed.    Speech Therapy Frequency 2x / week   Duration --  8 weeks or 16 total visits   Treatment/Interventions Compensatory strategies;Functional tasks;Cueing hierarchy;Patient/family education;Multimodal communcation approach;Internal/external aids;SLP instruction and feedback;Cognitive reorganization;Environmental controls   Potential to Achieve Goals Fair   Potential Considerations Co-morbidities   Consulted and Agree with Plan of Care Patient;Family member/caregiver   Family Member Consulted daugher      Patient will benefit from skilled therapeutic intervention in order to improve the following deficits and impairments:   Cognitive communication deficit - Plan: SLP plan of care cert/re-cert      G-Codes - [id number]    Functional Assessment Tool Used NOMS   Functional Limitations Attention   Attention Current Status [id number]) At least 40 percent but less than 60 percent impaired, limited or restricted   Attention Goal Status [id number]) At least 20 percent but less than 40 percent impaired, limited or restricted      Problem List Patient Active Problem List   Diagnosis Date Noted  . Malignant frontal lobe tumor ([place])   . Polyposis syndrome, familial 07/20/2016  . H/O: hysterectomy 07/20/2016  . Leukocytosis   . Monocytosis   . Acute lower UTI   .  GBM (glioblastoma multiforme) ([place])   . Glioblastoma multiforme of brain ([place]) 07/11/2016  . MR (mitral regurgitation)   . Brain tumor ([place])   . Apraxia   . Left-sided weakness   . Steroid-induced hyperglycemia   .  Leucocytosis   . Benign essential HTN   . History of gout   . Dysuria   . Acute respiratory failure with hypoxia ([place])   . Brain mass   . Seizure ([place]) 06/30/2016  . Status epilepticus ([place]) 06/30/2016    Makilah Dowda, Annye Rusk MS, CCC-SLP 07/27/2016, 3:32 PM  [facility] [address] [place], [place], [address] Phone: [phone]   Fax:  [phone]  Name: TEANA KOCA MRN: [id number] Date of Birth: 1945/11/19

## 2016-07-28 ENCOUNTER — Encounter (HOSPITAL_COMMUNITY): Payer: Self-pay

## 2016-07-28 ENCOUNTER — Telehealth: Payer: Self-pay

## 2016-07-28 NOTE — Telephone Encounter (Signed)
Oral Chemotherapy Pharmacist Encounter  I called [facility] for update on patient's temozolomide prescription. Patient has Medicare and a Medicare supplement and since temozolomide is a MediGap medication for GBM, this has been run under patient's Medicare Part B benefits and copayment is $0. [facility] stated they called the patient on 07/27/16 to alert her Rx is ready for pick-up. MD has been notified.  Oral oncology Clinic will continue to follow for toxicity and adherence management.  Johny Drilling, PharmD, BCPS, BCOP 07/28/2016  1:10 PM Oral Oncology Clinic [phone]

## 2016-07-28 NOTE — Telephone Encounter (Signed)
Appointment time 3:20pm , arrive time 3:00pm  and who it is with here [place] call- Magda Paganini (daughter)   1. Are you/is patient experiencing any problems since coming home? No Are there any questions regarding any aspect of care? No 2. Are there any questions regarding medications administration/dosing? No Are meds being taken as prescribed? Yes  3. Have there been any falls? No 4. Has Home Health been to the house and/or have they contacted you? No If not, have you tried to contact them? No Can we help you contact them? No 5. Are bowels and bladder emptying properly?Yes, daughter had to buy OTC stool softener to make bowels move, other than that no problems.  Are there any unexpected incontinence issues? No If applicable, is patient following bowel/bladder programs? 6. Any fevers, problems with breathing, unexpected pain? No 7. Are there any skin problems or new areas of breakdown? No 8. Has the patient/family member arranged specialty MD follow up (ie cardiology/neurology/renal/surgical/etc)?Yes  Can we help arrange? No 9. Does the patient need any other services or support that we can help arrange? No 10. Are caregivers following through as expected in assisting the patient? Yes 11. Has the patient quit smoking, drinking alcohol, or using drugs as recommended? Patient does not use tobacco products or alcohol.

## 2016-07-29 ENCOUNTER — Encounter: Payer: Medicare Other | Attending: Physical Medicine & Rehabilitation | Admitting: Physical Medicine & Rehabilitation

## 2016-07-29 ENCOUNTER — Encounter: Payer: Self-pay | Admitting: Physical Medicine & Rehabilitation

## 2016-07-29 VITALS — BP 119/67 | HR 83

## 2016-07-29 DIAGNOSIS — Z85841 Personal history of malignant neoplasm of brain: Secondary | ICD-10-CM | POA: Insufficient documentation

## 2016-07-29 DIAGNOSIS — E785 Hyperlipidemia, unspecified: Secondary | ICD-10-CM | POA: Insufficient documentation

## 2016-07-29 DIAGNOSIS — C719 Malignant neoplasm of brain, unspecified: Secondary | ICD-10-CM | POA: Diagnosis not present

## 2016-07-29 DIAGNOSIS — R002 Palpitations: Secondary | ICD-10-CM | POA: Diagnosis not present

## 2016-07-29 DIAGNOSIS — I341 Nonrheumatic mitral (valve) prolapse: Secondary | ICD-10-CM | POA: Insufficient documentation

## 2016-07-29 DIAGNOSIS — R32 Unspecified urinary incontinence: Secondary | ICD-10-CM | POA: Insufficient documentation

## 2016-07-29 DIAGNOSIS — I10 Essential (primary) hypertension: Secondary | ICD-10-CM | POA: Insufficient documentation

## 2016-07-29 DIAGNOSIS — I340 Nonrheumatic mitral (valve) insufficiency: Secondary | ICD-10-CM | POA: Diagnosis not present

## 2016-07-29 DIAGNOSIS — Z833 Family history of diabetes mellitus: Secondary | ICD-10-CM | POA: Diagnosis not present

## 2016-07-29 DIAGNOSIS — Z90710 Acquired absence of both cervix and uterus: Secondary | ICD-10-CM | POA: Insufficient documentation

## 2016-07-29 DIAGNOSIS — R569 Unspecified convulsions: Secondary | ICD-10-CM | POA: Diagnosis not present

## 2016-07-29 DIAGNOSIS — R482 Apraxia: Secondary | ICD-10-CM | POA: Insufficient documentation

## 2016-07-29 DIAGNOSIS — C711 Malignant neoplasm of frontal lobe: Secondary | ICD-10-CM | POA: Diagnosis not present

## 2016-07-29 DIAGNOSIS — D72829 Elevated white blood cell count, unspecified: Secondary | ICD-10-CM | POA: Diagnosis not present

## 2016-07-29 DIAGNOSIS — Z1509 Genetic susceptibility to other malignant neoplasm: Secondary | ICD-10-CM | POA: Insufficient documentation

## 2016-07-29 DIAGNOSIS — M109 Gout, unspecified: Secondary | ICD-10-CM | POA: Insufficient documentation

## 2016-07-29 DIAGNOSIS — Z801 Family history of malignant neoplasm of trachea, bronchus and lung: Secondary | ICD-10-CM | POA: Insufficient documentation

## 2016-07-29 DIAGNOSIS — E119 Type 2 diabetes mellitus without complications: Secondary | ICD-10-CM | POA: Diagnosis not present

## 2016-07-29 DIAGNOSIS — R4189 Other symptoms and signs involving cognitive functions and awareness: Secondary | ICD-10-CM

## 2016-07-29 DIAGNOSIS — Z800 Family history of malignant neoplasm of digestive organs: Secondary | ICD-10-CM | POA: Diagnosis not present

## 2016-07-29 DIAGNOSIS — Z510 Encounter for antineoplastic radiation therapy: Secondary | ICD-10-CM | POA: Diagnosis not present

## 2016-07-29 NOTE — Progress Notes (Signed)
Subjective:    Patient ID: Crystal Parsons, female    DOB: Oct 17, 1945, 70 y.o.   MRN: [id number]  HPI 70 y.o. female with history of HTN, Lynch syndrome, palpitations, recent gout flare presents for transitional care management after being discharged from CIR for GBM.  Admit date: 07/11/2016 Discharge date: 07/22/2016  Presents with husband and daughter, who provide 62 of history. At discharge, she was encouraged to follow up with Rad/Onc and she has an appointment next Tues.  She saw Oncology as well.  She continues to take Keppra.  BP has been controlled. Her gout is under control.  She has not scheduled an appointment with Neurosurg yet. She has an appointment to see PCP.  She is not driving at present.  Her headaches have resolved.   Therapies: 2/week with PT/OT/SLP.   DME: Bedside commode, purchased shower bench.  Mobility: No assistive device.   Pain Inventory Average Pain 2 Pain Right Now 0 My pain is intermittent and aching  In the last 24 hours, has pain interfered with the following? General activity 0 Relation with others 0 Enjoyment of life 0 What TIME of day is your pain at its worst? morning Sleep (in general) Good  Pain is worse with: inactivity Pain improves with: medication Relief from Meds: 9  Mobility walk without assistance ability to climb steps?  yes do you drive?  no transfers alone  Function retired I need assistance with the following:  meal prep, household duties and shopping  Neuro/Psych bladder control problems confusion depression anxiety  Prior Studies Any changes since last visit?  no  Physicians involved in your care Any changes since last visit?  no   Family History  Problem Relation Age of Onset  . Cancer Mother     lung cancer and colon ca  . Colon cancer Mother   . Cancer Father   . Colon cancer Father 48  . Diabetes Father    Social History   Social History  . Marital status: Married    Spouse name: Hailee Keitt  . Number of children: N/A  . Years of education: N/A   Social History Main Topics  . Smoking status: Never Smoker  . Smokeless tobacco: Never Used  . Alcohol use Yes     Comment: socially  . Drug use: No  . Sexual activity: Not on file   Other Topics Concern  . Not on file   Social History Narrative  . No narrative on file   Past Surgical History:  Procedure Laterality Date  . ABDOMINAL HYSTERECTOMY    . APPLICATION OF CRANIAL NAVIGATION Right 07/07/2016   Procedure: APPLICATION OF CRANIAL NAVIGATION;  Surgeon: Consuella Lose, MD;  Location: [facility];  Service: Neurosurgery;  Laterality: Right;  . BREAST LUMPECTOMY Left   . CRANIOTOMY Right 07/07/2016   Procedure: CRANIOTOMY TUMOR EXCISION w/BrainLab;  Surgeon: Consuella Lose, MD;  Location: [facility];  Service: Neurosurgery;  Laterality: Right;  . HEMORROIDECTOMY  2009  . TUBAL LIGATION     Past Medical History:  Diagnosis Date  . Anxiety   . Bradycardia    At times with pulse in the 40s  . Brain cancer ([place])    Glioblastoma  . Diarrhea    With blating, improved with gluten-free diet  . Diastolic dysfunction   . Essential hypertension, benign    Always has HTN when at the doctor's office.  . Glioblastoma multiforme of brain ([place]) 07/11/2016  . Gout   . Hyperlipidemia   .  Lynch syndrome   . Mild aortic sclerosis ([place])   . Mitral valve problem    Mildly thickened mitral valve  . Mitral valve prolapse    Mild, anterior  . MR (mitral regurgitation)    ECHO 08/10/09 shows mild MR again and normal EF. No significant changes from prior ECHO.  Marland Kitchen Palpitations    Occasional, but are not significant. ECHO 08/20/08 - Normal EF (60%), mildly thickened mitral valve with mild anterior mitral valve prolapse, mild MR, mild aortic sclerosis, grade 1 diastolic dysfunction.  . Proteinuria    Likely secondary to Diabetes   There were no vitals taken for this visit.  Opioid Risk Score:   Fall Risk Score:  `1  Depression  screen PHQ 2/9  No flowsheet data found. Review of Systems  Constitutional: Negative.   HENT: Negative.   Eyes: Negative.   Respiratory: Negative.   Cardiovascular: Negative.   Gastrointestinal: Negative.   Endocrine: Negative.   Genitourinary: Positive for difficulty urinating.  Musculoskeletal: Negative.   Skin: Positive for rash.  Allergic/Immunologic: Negative.   Neurological: Negative.   Hematological: Negative.   Psychiatric/Behavioral: Positive for confusion and dysphoric mood. The patient is nervous/anxious.   All other systems reviewed and are negative.     Objective:   Physical Exam Constitutional: She appears well-developed. NAD. HENT: Normocephalic.  Eyes: EOM are normal. No discharge.  Cardiovascular: RRR. No JVD. Respiratory: Effort normal and breath sounds normal.  GI: Soft. Bowel sounds are normal.  Musculoskeletal: She exhibits no edema or tenderness. B/l knee valgus Gait: Slightly wide based Neurological: She is alert and oriented x3.  Motor: Motor: 4+-5/5 throughout  Skin: Skin is warm and dry.  Right curvilinear scalp incision clean,dry, intact.  Psychiatric: Her speech is normal, behavior and affect are normal.    Assessment & Plan:  70 y.o. female with history of HTN, Lynch syndrome, palpitations, recent gout flare presents for transitional care management after being discharged from CIR for GBM.  1.  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   Improving  Follow up with Neurosurg  Cont therapies  Follow up with Rad/Onc, Heme/Onc  2.  Seizures  Cont keppra.   3. Leukocytosis  Likely secondary to steroids, no other signs/symptoms of infections  Being followed by Heme/Onc  4. HTN:   Controlled at present  Follow up with PCP  5. Urinary incontinence  Timed voiding  Consider medications in future  Will order UA/Ucx

## 2016-08-02 ENCOUNTER — Ambulatory Visit: Payer: Medicare Other | Admitting: Occupational Therapy

## 2016-08-02 ENCOUNTER — Ambulatory Visit: Payer: Medicare Other | Attending: Physical Medicine & Rehabilitation

## 2016-08-02 ENCOUNTER — Ambulatory Visit
Admission: RE | Admit: 2016-08-02 | Discharge: 2016-08-02 | Disposition: A | Discharge status: Home / Self Care | Payer: Medicare Other | Source: Ambulatory Visit | Attending: Radiation Oncology | Admitting: Radiation Oncology

## 2016-08-02 ENCOUNTER — Encounter: Payer: Self-pay | Admitting: Occupational Therapy

## 2016-08-02 DIAGNOSIS — Z510 Encounter for antineoplastic radiation therapy: Secondary | ICD-10-CM | POA: Diagnosis not present

## 2016-08-02 DIAGNOSIS — R278 Other lack of coordination: Secondary | ICD-10-CM

## 2016-08-02 DIAGNOSIS — R41842 Visuospatial deficit: Secondary | ICD-10-CM | POA: Diagnosis not present

## 2016-08-02 DIAGNOSIS — R41841 Cognitive communication deficit: Secondary | ICD-10-CM | POA: Diagnosis not present

## 2016-08-02 DIAGNOSIS — M6281 Muscle weakness (generalized): Secondary | ICD-10-CM

## 2016-08-02 DIAGNOSIS — R2689 Other abnormalities of gait and mobility: Secondary | ICD-10-CM | POA: Diagnosis not present

## 2016-08-02 DIAGNOSIS — C711 Malignant neoplasm of frontal lobe: Secondary | ICD-10-CM | POA: Diagnosis not present

## 2016-08-02 DIAGNOSIS — R41844 Frontal lobe and executive function deficit: Secondary | ICD-10-CM | POA: Diagnosis not present

## 2016-08-02 DIAGNOSIS — G8194 Hemiplegia, unspecified affecting left nondominant side: Secondary | ICD-10-CM | POA: Insufficient documentation

## 2016-08-02 DIAGNOSIS — C719 Malignant neoplasm of brain, unspecified: Secondary | ICD-10-CM | POA: Diagnosis not present

## 2016-08-02 NOTE — Therapy (Signed)
[facility] [address] [place], [place], [address] Phone: [phone]   Fax:  [phone]  Occupational Therapy Evaluation  Patient Details  Name: Crystal Parsons MRN: [id number] Date of Birth: [birth year] Referring Provider: Dr. Delice Lesch  Encounter Date: 08/02/2016      OT End of Session - 08/02/16 1241    Visit Number 1   Number of Visits 16   Date for OT Re-Evaluation 09/27/16   Authorization Type Medicare - will need G code and PN every 10th visit   Authorization Time Period 60 days   Authorization - Visit Number 1   Authorization - Number of Visits 10   OT Start Time 1104   OT Stop Time 1146   OT Time Calculation (min) 42 min   Activity Tolerance Patient tolerated treatment well      Past Medical History:  Diagnosis Date  . Anxiety   . Bradycardia    At times with pulse in the 40s  . Brain cancer ([place])    Glioblastoma  . Diarrhea    With blating, improved with gluten-free diet  . Diastolic dysfunction   . Essential hypertension, benign    Always has HTN when at the doctor's office.  . Glioblastoma multiforme of brain ([place]) 07/11/2016  . Gout   . Hyperlipidemia   . Lynch syndrome   . Mild aortic sclerosis ([place])   . Mitral valve problem    Mildly thickened mitral valve  . Mitral valve prolapse    Mild, anterior  . MR (mitral regurgitation)    ECHO 08/10/09 shows mild MR again and normal EF. No significant changes from prior ECHO.  Marland Kitchen Palpitations    Occasional, but are not significant. ECHO 08/20/08 - Normal EF (60%), mildly thickened mitral valve with mild anterior mitral valve prolapse, mild MR, mild aortic sclerosis, grade 1 diastolic dysfunction.  . Proteinuria    Likely secondary to Diabetes    Past Surgical History:  Procedure Laterality Date  . ABDOMINAL HYSTERECTOMY    . APPLICATION OF CRANIAL NAVIGATION Right 07/07/2016   Procedure: APPLICATION OF CRANIAL NAVIGATION;  Surgeon:  Consuella Lose, MD;  Location: [facility];  Service: Neurosurgery;  Laterality: Right;  . BREAST LUMPECTOMY Left   . CRANIOTOMY Right 07/07/2016   Procedure: CRANIOTOMY TUMOR EXCISION w/BrainLab;  Surgeon: Consuella Lose, MD;  Location: [facility];  Service: Neurosurgery;  Laterality: Right;  . HEMORROIDECTOMY  2009  . TUBAL LIGATION      There were no vitals filed for this visit.      Subjective Assessment - 08/02/16 1110    Subjective  I don't have any pain just weakness in my leg   Pertinent History see epic pt with R frontal mass x2 s/p crani starts radiation and chemo today;    Patient Stated Goals I want to feel like myself again   Currently in Pain? No/denies           [facility] OT Assessment - 08/02/16 0001      Assessment   Diagnosis R frontal brain tumor   Referring Provider Dr. Delice Lesch   Onset Date 06/30/16   Assessment inpt PT, OT and ST     Precautions   Precautions Fall   Precaution Comments No strenous activity, no driving,      Restrictions   Weight Bearing Restrictions No     Balance Screen   Has the patient fallen in the past 6 months Yes  slid off bed and  lowered herself to the floor no injury   How many times? [place] expects to be discharged to: Private residence   Living Arrangements Spouse/significant other   Available Help at Discharge Available 24 hours/day   Type of [place] One level   Bathroom Shower/Tub Tub/Shower unit   [employer] Standard   Additional Comments Pt has grab bars in shower and seat. Uses vanity to assist in getting on and off toilet     Prior Function   Level of Independence Independent   Vocation Retired   Clinical research associate, Materials engineer, cooking     ADL   Eating/Feeding Independent   Grooming Independent   Upper Body Bathing Modified independent   Lower Body Bathing Modified independent   Upper Body Dressing Supervision/safety  cues to intiate sometimes and to  choose appropriate clothes   Lower Body [place] Modified independent     IADL   Shopping Needs to be accompanied on any shopping trip   Light Housekeeping Needs help with all home maintenance tasks   Meal Prep Needs to have meals prepared and served  heated food in microwave only   [place] on family or friends for transportation   Medication Management Is not capable of dispensing or managing own medication  needs assist organizing and remembering to take   Financial Management Requires assistance     Mobility   Mobility Status Needs assist   Mobility Status Comments supervision in the community     Written Expression   Dominant Hand Right     Vision - History   Baseline Vision Wears contact  and readers     Vision Assessment   Comment Pt reports she has always had a 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  Had mild cataracts but MD only monitoring.  Notices slighlty increased blurriness when reading.      Activity Tolerance   Activity Tolerance Tolerate 30+ min activity without fatigue   Activity Tolerance Comments Reports she can do for about an hour and then has to rest (I am trying not to give into it)     Cognition   Overall Cognitive Status Impaired/Different from baseline   Mini Mental State Exam  Pt reports that her thinking is slowed.    Area of Impairment Attention;Memory;Awareness;Problem solving;Safety/judgement   Current Attention Level Alternating;Selective;Sustained   Memory Decreased recall of precautions   Safety/Judgement Decreased awareness of deficits   Awareness Intellectual   Problem Solving Difficulty sequencing   Problem Solving Impaired   Problem Solving Impairment Verbal complex;Functional complex   Oceanographer;Sequencing     Sensation   Light Touch Appears Intact   Hot/Cold Appears Intact   Proprioception Appears Intact     Coordination   Gross Motor Movements are Fluid and Coordinated Yes   Fine Motor Movements are Fluid and Coordinated No   Finger Nose Finger Test mildly decreased   9 Hole Peg Test Right;Left   Right 9 Hole Peg Test 27.02  pt delayed due to slow processing   Left 9 Hole Peg Test 38.25     Perception   Perception Impaired     ROM / Strength   AROM / PROM / Strength AROM;Strength  AROM   Overall AROM  Within functional limits for tasks performed   Overall AROM Comments BUE's     Strength   Overall Strength Deficits   Overall Strength Comments LUE= horizontal abduction, shoulder flexion 4+/5     Hand Function   Right Hand Gross Grasp Functional   Right Hand Grip (lbs) 40   Left Hand Gross Grasp Impaired   Left Hand Grip (lbs) 30                           OT Short Term Goals - 08/02/16 1222      OT SHORT TERM GOAL #1   Title Pt and husband will be mod I with HEP - 08/30/2106   Status New     OT SHORT TERM GOAL #2   Title Pt will decrease time on 9 hold peg with L hand by at least 4 seconds (baseline = 38.25) to assist with fine motor tasks   Status New     OT SHORT TERM GOAL #3   Title Pt will require supervision for simple, familiar hot meal prep   Status New     OT SHORT TERM GOAL #4   Title Pt will demonstate ability to follow 2-3 step instruction within context of functional, familar task.    Status New     OT SHORT TERM GOAL #5   Title Pt will identify at least 2 strategies for ST memory    Status New     OT SHORT TERM GOAL #6   Title Pt will require no more than 2 vc's for selective attention during functional familar tasks in a busy environment.            OT Long Term Goals - 08/02/16 1227      OT LONG TERM GOAL #1   Title Pt and husband will be mod I with upgraded HEP prn - 09/27/2016   Status New      OT LONG TERM GOAL #2   Title Pt will decrease time on 9 hole peg test with L hand by at least 8 seconds (baseline=38.25) to assist with functional tasks.   Status New     OT LONG TERM GOAL #3   Title Pt will increase grip strength by 5 pounds to assist with functional use of L, non dominant hand (baseline= 30 pounds)   Status New     OT LONG TERM GOAL #4   Title Pt will be mod I with cooking 2 items simultaneously for altenating attention and safety with familiar hot meal prep.    Status New     OT LONG TERM GOAL #5   Title Pt will be mod I for laundry and simple home mgmt tasks.   Status New     OT LONG TERM GOAL #6   Title Pt will be able to set up med pill box with supervision only   Status New     OT LONG TERM GOAL #7   Title Pt will be mod I to attend to L during functional tasks.                Plan - 08/02/16 1234    Clinical Impression Statement Pt is a 71 year old female s/p R frontal craniotomy for removal of glioblastoma.  Pt was hospitalized from 06/30/2016-07/22/2016 including inpt rehab stay.  PMH: seizures, acute hypoxic respiratory failure, HTN, lynch syndrome, gout.  Pt presents today with  the following impairments that impact her ability to complete ADL's, IADL's and leisure activities:  L non dominant hemiplegia, muscle weakness, decreased coordination, decreased activity tolerance, impaired cognition including decreased attentin, ST memory, organization, problem solving, insight, judgement, following multi step instructions, L neglect.  Pt will benefit from skilled OT to addres these deficits and maximize independence.     Rehab Potential Good   OT Frequency 2x / week   OT Duration 8 weeks   OT Treatment/Interventions Self-care/ADL training;DME and/or AE instruction;Neuromuscular education;Therapeutic exercise;Therapeutic activities;Patient/family education;Visual/perceptual remediation/compensation;Cognitive remediation/compensation   Plan initite HEP  for coordination and grip strength for LUE; husband will need to supervise given cognitive deficits   Consulted and Agree with Plan of Care Patient;Family member/caregiver   Family Member Consulted husband John      Patient will benefit from skilled therapeutic intervention in order to improve the following deficits and impairments:  Decreased activity tolerance, Decreased coordination, Decreased cognition, Decreased safety awareness, Decreased strength, Impaired UE functional use, Impaired vision/preception  Visit Diagnosis: 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, unspecified hemiplegia type ([place]) - Plan: Ot plan of care cert/re-cert  Muscle weakness (generalized) - Plan: Ot plan of care cert/re-cert  Other lack of coordination - Plan: Ot plan of care cert/re-cert  Frontal lobe and executive function deficit - Plan: Ot plan of care cert/re-cert  Visuospatial deficit - Plan: Ot plan of care cert/re-cert      G-Codes - [id number]    Functional Assessment Tool Used skilled clinical observation, 9 hole peg, dynamometer   Functional Limitation Self care  IADL's   Self Care Current Status [id number]) At least 80 percent but less than 100 percent impaired, limited or restricted   Self Care Goal Status [id number]) At least 20 percent but less than 40 percent impaired, limited or restricted      Problem List Patient Active Problem List   Diagnosis Date Noted  . Malignant frontal lobe tumor ([place])   . Polyposis syndrome, familial 07/20/2016  . H/O: hysterectomy 07/20/2016  . Leukocytosis   . Monocytosis   . Acute lower UTI   . GBM (glioblastoma multiforme) ([place])   . Glioblastoma multiforme of brain ([place]) 07/11/2016  . MR (mitral regurgitation)   . Brain tumor ([place])   . Apraxia   . Left-sided weakness   . Steroid-induced hyperglycemia   . Leucocytosis   . Benign essential HTN   . History of gout   . Dysuria   . Acute respiratory failure with hypoxia  ([place])   . Brain mass   . Seizure ([place]) 06/30/2016  . Status epilepticus ([place]) 06/30/2016    Quay Burow, OTR/L 08/02/2016, 12:47 PM  [facility] [address] [place], [place], [address] Phone: [phone]   Fax:  [phone]  Name: LELER DRAPER MRN: [id number] Date of Birth: 1945-09-03

## 2016-08-02 NOTE — Therapy (Signed)
[facility] [address] [place], [place], [address] Phone: [phone]   Fax:  [phone]  Physical Therapy Evaluation  Patient Details  Name: Crystal Parsons MRN: [id number] Date of Birth: 1946/04/27 Referring Provider: Dr. Posey Pronto  Encounter Date: 08/02/2016      PT End of Session - 08/02/16 1316    Visit Number 1   Number of Visits 9   Date for PT Re-Evaluation 09/01/16   Authorization Type Medicare primary and BCBS secondary. G-CODE AND PROGRESS NOTE EVERY 10TH VISIT.    PT Start Time 1147   PT Stop Time 1222   PT Time Calculation (min) 35 min   Equipment Utilized During Treatment Gait belt   Activity Tolerance Patient tolerated treatment well   Behavior During Therapy WFL for tasks assessed/performed      Past Medical History:  Diagnosis Date  . Anxiety   . Bradycardia    At times with pulse in the 40s  . Brain cancer ([place])    Glioblastoma  . Diarrhea    With blating, improved with gluten-free diet  . Diastolic dysfunction   . Essential hypertension, benign    Always has HTN when at the doctor's office.  . Glioblastoma multiforme of brain ([facility]) 07/11/2016  . Gout   . Hyperlipidemia   . Lynch syndrome   . Mild aortic sclerosis ([facility])   . Mitral valve problem    Mildly thickened mitral valve  . Mitral valve prolapse    Mild, anterior  . MR (mitral regurgitation)    ECHO 08/10/09 shows mild MR again and normal EF. No significant changes from prior ECHO.  Marland Kitchen Palpitations    Occasional, but are not significant. ECHO 08/20/08 - Normal EF (60%), mildly thickened mitral valve with mild anterior mitral valve prolapse, mild MR, mild aortic sclerosis, grade 1 diastolic dysfunction.  . Proteinuria    Likely secondary to Diabetes    Past Surgical History:  Procedure Laterality Date  . ABDOMINAL HYSTERECTOMY    . APPLICATION OF CRANIAL NAVIGATION Right 07/07/2016   Procedure: APPLICATION OF CRANIAL  NAVIGATION;  Surgeon: Consuella Lose, MD;  Location: [facility];  Service: Neurosurgery;  Laterality: Right;  . BREAST LUMPECTOMY Left   . CRANIOTOMY Right 07/07/2016   Procedure: CRANIOTOMY TUMOR EXCISION w/BrainLab;  Surgeon: Consuella Lose, MD;  Location: [facility];  Service: Neurosurgery;  Laterality: Right;  . HEMORROIDECTOMY  2009  . TUBAL LIGATION      There were no vitals filed for this visit.       Subjective Assessment - 08/02/16 1154    Subjective Pt recently d/c on 07/22/16 from [facility] rehab s/p craniotomy to resect glioblatoma multiforme (GBM). Pt reports she feels good walking but husband states he feels a need to be with pt in the community for safety. Pt reports no difficulty traversing steps. Pt reports some weakness in B knees. Pt feels she's back to baseline regarding balance and walking.  Pt has had an injection in R knee for pain (approx. 2 years ago).  Pt begins chemo today. Pt also reports incr. fatigue after amb. longer distances/performing activities.  Pt denied dizziness.    Patient is accompained by: Family member  John-husband   Pertinent History HTN, Lynch syndrome, gout, urinary incontinence, seizures, palpitations    Currently in Pain? No/denies            [facility] PT Assessment - 08/02/16 1157      Assessment   Medical Diagnosis Brain  tumor   Referring Provider Dr. Posey Pronto   Onset Date/Surgical Date 07/07/16   Hand Dominance Right   Prior Therapy Inpt rehab 07/12/16-07/22/16     Precautions   Precautions Fall  based on FGA score.    Precaution Comments No strenous activity, no driving,      Restrictions   Weight Bearing Restrictions No     Balance Screen   Has the patient fallen in the past 6 months No   Has the patient had a decrease in activity level because of a fear of falling?  No   Is the patient reluctant to leave their home because of a fear of falling?  No     Home Ecologist residence   Living  Arrangements Spouse/significant other   Available Help at Discharge Family   Type of [place] to enter   Entrance Stairs-Number of Steps 1   Entrance Stairs-Rails --  [place] One level   Walker seat;Grab bars - tub/shower     Prior Function   Level of [place] Retired   Clinical research associate, Materials engineer, cooking     Cognition   Overall Cognitive Status Impaired/Different from baseline   Area of Impairment Attention;Memory;Awareness;Problem solving;Safety/judgement  see OT and speech notes.   Current Attention Level Sustained     Sensation   Light Touch Appears Intact     Posture/Postural Control   Posture/Postural Control No significant limitations     ROM / Strength   AROM / PROM / Strength AROM;Strength     AROM   Overall AROM  Within functional limits for tasks performed   Overall AROM Comments BLE's     Strength   Overall Strength Deficits   Overall Strength Comments LLE: hip flex and knee flex: 4-/5, otherwise 4+/5. B hip ext weakness suspected 2/2 gait deviations. B hip abd/add in seated position: 4/5.     Transfers   Transfers Sit to Stand;Stand to Sit   Sit to Stand 7: Independent;Without upper extremity assist;From chair/3-in-1   Stand to Sit 7: Independent;Without upper extremity assist;To chair/3-in-1     Ambulation/Gait   Ambulation/Gait Yes   Ambulation/Gait Assistance 5: Supervision   Ambulation/Gait Assistance Details Decr. speed noted during dynamic gait activities.   Ambulation Distance (Feet) 300 Feet   Assistive device None   Gait Pattern Step-through pattern;Decreased stride length  intermittent L foot ER   Ambulation Surface Level;Indoor   Gait velocity 2.67ft/sec.     Functional Gait  Assessment   Gait assessed  Yes   Gait Level Surface Walks 20 ft in less than 7 sec but greater than 5.5 sec, uses assistive device, slower speed, mild gait deviations, or deviates  6-10 in outside of the 12 in walkway width.  8.8sec.   Change in Gait Speed Able to change speed, demonstrates mild gait deviations, deviates 6-10 in outside of the 12 in walkway width, or no gait deviations, unable to achieve a major change in velocity, or uses a change in velocity, or uses an assistive device.   Gait with Horizontal Head Turns Performs head turns smoothly with slight change in gait velocity (eg, minor disruption to smooth gait path), deviates 6-10 in outside 12 in walkway width, or uses an assistive device.   Gait with Vertical Head Turns Performs task with slight change in gait velocity (eg, minor disruption to smooth gait path), deviates 6 - 10 in  outside 12 in walkway width or uses assistive device   Gait and Pivot Turn Pivot turns safely within 3 sec and stops quickly with no loss of balance.   Step Over Obstacle Is able to step over 2 stacked shoe boxes taped together (9 in total height) without changing gait speed. No evidence of imbalance.   Gait with Narrow Base of Support Ambulates 4-7 steps.   Gait with Eyes Closed Walks 20 ft, uses assistive device, slower speed, mild gait deviations, deviates 6-10 in outside 12 in walkway width. Ambulates 20 ft in less than 9 sec but greater than 7 sec.   Ambulating Backwards Walks 20 ft, slow speed, abnormal gait pattern, evidence for imbalance, deviates 10-15 in outside 12 in walkway width.   Steps Alternating feet, must use rail.   Total Score 20                           PT Education - 08/02/16 1315    Education provided Yes   Education Details PT discussed outcome measures and POC (frequency/duration).    Person(s) Educated Patient;Spouse   Methods Explanation   Comprehension Verbalized understanding          PT Short Term Goals - 08/02/16 1334      PT SHORT TERM GOAL #1   Title same as LTGs            PT Long Term Goals - 08/02/16 1334      PT LONG TERM GOAL #1   Title Pt will be IND in  HEP to improve endurance, strength, and balance. TARGET DATE FOR ALL LTGS: 08/30/16   Status New     PT LONG TERM GOAL #2   Title Pt will improve FGA score to >/=25/30 to decr. falls risk.    Status New     PT LONG TERM GOAL #3   Title Pt will improve gait speed to >/=3.73ft/sec., no AD, to amb. safely in the community.    Status New     PT LONG TERM GOAL #4   Title Pt will amb. 1000' over even/uneven terrain, IND, in order to improve functional mobility.    Status New     PT LONG TERM GOAL #5   Title Perform 6MWT and write goal as indicated.    Status New               Plan - 08/02/16 1322    Clinical Impression Statement Pt is a pleasant 70y/p female presenting to OPPT neuro s/p craniotomy to resect R sided GBM (glioblastoma multiforme) on 07/07/16. Pt's PMH significant for the following: HTN, Lynch syndrome, gout, urinary incontinence, seizures, palpitations. Pt is also scheduled to begin chemo/radiation therapy today to treat GBM. Pt presented with the following impairments during PT exam: gait deviations, impaired balance, decr. strength, decr. endurance, and cognitive impairments. Pt denied difficulty traversing steps during hx intake, however, she required one handrail to ascend steps and two handrails to safely descend steps. Pt's FGA score indicates pt is at a medium risk for falls. Pt's gait speed indicates she is not able to safely amb. in the community. Pt would benefit from skilled PT to improve safety during functional mobility.    Rehab Potential Good   Clinical Impairments Affecting Rehab Potential GBM   PT Frequency 2x / week   PT Duration 4 weeks   PT Treatment/Interventions ADLs/Self Care Home Management;Biofeedback;DME Instruction;Gait training;Stair training;Functional mobility training;Therapeutic activities;Therapeutic exercise;Balance  training;Neuromuscular re-education;Manual techniques;Orthotic Fit/Training;Patient/family education;Vestibular   PT Next Visit  Plan Perform 6 MWT and provide pt with LLE strengthening, high level balance HEP.    Consulted and Agree with Plan of Care Patient      Patient will benefit from skilled therapeutic intervention in order to improve the following deficits and impairments:  Abnormal gait, Decreased endurance, Decreased knowledge of use of DME, Decreased balance, Decreased mobility, Decreased cognition, Impaired flexibility  Visit Diagnosis: Other abnormalities of gait and mobility - Plan: PT plan of care cert/re-cert  Muscle weakness (generalized) - Plan: PT plan of care cert/re-cert      G-Codes - [id number]    Functional Assessment Tool Used FGA: 20/30 and gait speed no AD: 2.10ft/sec.   Functional Limitation Mobility: Walking and moving around   Mobility: Walking and Moving Around Current Status [id number]) At least 40 percent but less than 60 percent impaired, limited or restricted   Mobility: Walking and Moving Around Goal Status [phone]) At least 1 percent but less than 20 percent impaired, limited or restricted       Problem List Patient Active Problem List   Diagnosis Date Noted  . Malignant frontal lobe tumor ([place])   . Polyposis syndrome, familial 07/20/2016  . H/O: hysterectomy 07/20/2016  . Leukocytosis   . Monocytosis   . Acute lower UTI   . GBM (glioblastoma multiforme) ([place])   . Glioblastoma multiforme of brain ([place]) 07/11/2016  . MR (mitral regurgitation)   . Brain tumor ([place])   . Apraxia   . Left-sided weakness   . Steroid-induced hyperglycemia   . Leucocytosis   . Benign essential HTN   . History of gout   . Dysuria   . Acute respiratory failure with hypoxia ([place])   . Brain mass   . Seizure ([place]) 06/30/2016  . Status epilepticus ([place]) 06/30/2016    Miller,Jennifer L 08/02/2016, 1:40 PM  [facility] [address] [place], [place], [address] Phone: [phone]   Fax:  [phone]  Name: KIREN CODAY MRN: [id number] Date of Birth: 1946-03-21  Geoffry Paradise, PT,DPT 08/02/16 1:41 PM Phone: [phone] Fax: [phone]

## 2016-08-03 ENCOUNTER — Encounter: Payer: Medicare Other | Admitting: Physical Medicine & Rehabilitation

## 2016-08-03 ENCOUNTER — Ambulatory Visit: Payer: Medicare Other | Admitting: Radiation Oncology

## 2016-08-03 ENCOUNTER — Ambulatory Visit: Payer: Medicare Other

## 2016-08-03 ENCOUNTER — Ambulatory Visit
Admission: RE | Admit: 2016-08-03 | Discharge: 2016-08-03 | Disposition: A | Discharge status: Home / Self Care | Payer: Medicare Other | Source: Ambulatory Visit | Attending: Radiation Oncology | Admitting: Radiation Oncology

## 2016-08-03 DIAGNOSIS — C711 Malignant neoplasm of frontal lobe: Secondary | ICD-10-CM | POA: Diagnosis not present

## 2016-08-03 DIAGNOSIS — R32 Unspecified urinary incontinence: Secondary | ICD-10-CM | POA: Diagnosis not present

## 2016-08-03 DIAGNOSIS — C719 Malignant neoplasm of brain, unspecified: Secondary | ICD-10-CM | POA: Diagnosis not present

## 2016-08-03 DIAGNOSIS — Z510 Encounter for antineoplastic radiation therapy: Secondary | ICD-10-CM | POA: Diagnosis not present

## 2016-08-04 ENCOUNTER — Ambulatory Visit
Admission: RE | Admit: 2016-08-04 | Discharge: 2016-08-04 | Disposition: A | Discharge status: Home / Self Care | Payer: Medicare Other | Source: Ambulatory Visit | Attending: Radiation Oncology | Admitting: Radiation Oncology

## 2016-08-04 ENCOUNTER — Ambulatory Visit: Payer: Medicare Other

## 2016-08-04 DIAGNOSIS — Z510 Encounter for antineoplastic radiation therapy: Secondary | ICD-10-CM | POA: Diagnosis not present

## 2016-08-04 DIAGNOSIS — C711 Malignant neoplasm of frontal lobe: Secondary | ICD-10-CM | POA: Diagnosis not present

## 2016-08-04 DIAGNOSIS — R2689 Other abnormalities of gait and mobility: Secondary | ICD-10-CM | POA: Diagnosis not present

## 2016-08-04 DIAGNOSIS — R41841 Cognitive communication deficit: Secondary | ICD-10-CM

## 2016-08-04 DIAGNOSIS — G8194 Hemiplegia, unspecified affecting left nondominant side: Secondary | ICD-10-CM | POA: Diagnosis not present

## 2016-08-04 DIAGNOSIS — R278 Other lack of coordination: Secondary | ICD-10-CM | POA: Diagnosis not present

## 2016-08-04 DIAGNOSIS — R41844 Frontal lobe and executive function deficit: Secondary | ICD-10-CM | POA: Diagnosis not present

## 2016-08-04 DIAGNOSIS — M6281 Muscle weakness (generalized): Secondary | ICD-10-CM | POA: Diagnosis not present

## 2016-08-04 DIAGNOSIS — C719 Malignant neoplasm of brain, unspecified: Secondary | ICD-10-CM | POA: Diagnosis not present

## 2016-08-04 DIAGNOSIS — R41842 Visuospatial deficit: Secondary | ICD-10-CM | POA: Diagnosis not present

## 2016-08-04 LAB — URINALYSIS, ROUTINE W REFLEX MICROSCOPIC
Bilirubin, UA: NEGATIVE
GLUCOSE, UA: NEGATIVE
KETONES UA: NEGATIVE
NITRITE UA: NEGATIVE
Protein, UA: NEGATIVE
RBC UA: NEGATIVE
SPEC GRAV UA: 1.015 (ref 1.005–1.030)
Urobilinogen, Ur: 0.2 mg/dL (ref 0.2–1.0)
pH, UA: 5.5 (ref 5.0–7.5)

## 2016-08-04 LAB — MICROSCOPIC EXAMINATION: Casts: NONE SEEN /lpf

## 2016-08-04 NOTE — Patient Instructions (Addendum)
  FOR THE MEDICATIONS: You and your husband make a list (you write it) of when you are supposed to take your meds;   Put the list on the kitchen table.  Set a recurring alarm for the 4 pm meds.  At the times of your medication administration, look at your list and tell your husband what medications you need at that time.  He will either correct you and guide you to look at the meds corresponding to the time of day, or tell you you're correct and he's bringing them over to you.

## 2016-08-05 ENCOUNTER — Other Ambulatory Visit: Payer: Self-pay | Admitting: Hematology and Oncology

## 2016-08-05 ENCOUNTER — Ambulatory Visit
Admission: RE | Admit: 2016-08-05 | Discharge: 2016-08-05 | Disposition: A | Discharge status: Home / Self Care | Payer: Medicare Other | Source: Ambulatory Visit | Attending: Radiation Oncology | Admitting: Radiation Oncology

## 2016-08-05 VITALS — BP 142/61 | HR 87 | Resp 18 | Wt 193.8 lb

## 2016-08-05 DIAGNOSIS — C711 Malignant neoplasm of frontal lobe: Secondary | ICD-10-CM | POA: Diagnosis not present

## 2016-08-05 DIAGNOSIS — C719 Malignant neoplasm of brain, unspecified: Secondary | ICD-10-CM

## 2016-08-05 DIAGNOSIS — Z510 Encounter for antineoplastic radiation therapy: Secondary | ICD-10-CM | POA: Diagnosis not present

## 2016-08-05 NOTE — Therapy (Signed)
[facility] [address] [place], [place], [address] Phone: [phone]   Fax:  [phone]  Speech Language Pathology Treatment  Patient Details  Name: Crystal Parsons MRN: [id number] Date of Birth: 11/02/1945 Referring Provider: Dr. Delice Lesch  Encounter Date: 08/04/2016      End of Session - 08/04/16 1524    Visit Number 2   Number of Visits 17   Date for SLP Re-Evaluation 09/21/16   SLP Start Time 1447   SLP Stop Time  1530   SLP Time Calculation (min) 43 min   Activity Tolerance Patient tolerated treatment well      Past Medical History:  Diagnosis Date  . Anxiety   . Bradycardia    At times with pulse in the 40s  . Brain cancer ([place])    Glioblastoma  . Diarrhea    With blating, improved with gluten-free diet  . Diastolic dysfunction   . Essential hypertension, benign    Always has HTN when at the doctor's office.  . 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 Gout   . Hyperlipidemia   . Lynch syndrome   . Mild aortic sclerosis ([place])   . Mitral valve problem    Mildly thickened mitral valve  . Mitral valve prolapse    Mild, anterior  . MR (mitral regurgitation)    ECHO 08/10/09 shows mild MR again and normal EF. No significant changes from prior ECHO.  Marland Kitchen Palpitations    Occasional, but are not significant. ECHO 08/20/08 - Normal EF (60%), mildly thickened mitral valve with mild anterior mitral valve prolapse, mild MR, mild aortic sclerosis, grade 1 diastolic dysfunction.  . Proteinuria    Likely secondary to Diabetes    Past Surgical History:  Procedure Laterality Date  . ABDOMINAL HYSTERECTOMY    . APPLICATION OF CRANIAL NAVIGATION Right 07/07/2016   Procedure: APPLICATION OF CRANIAL NAVIGATION;  Surgeon: Consuella Lose, MD;  Location: [facility];  Service: Neurosurgery;  Laterality: Right;  . BREAST LUMPECTOMY Left   . CRANIOTOMY Right 07/07/2016   Procedure: CRANIOTOMY TUMOR EXCISION  w/BrainLab;  Surgeon: Consuella Lose, MD;  Location: [facility];  Service: Neurosurgery;  Laterality: Right;  . HEMORROIDECTOMY  2009  . TUBAL LIGATION      There were no vitals filed for this visit.      Subjective Assessment - 08/04/16 1501    Subjective Pt took all her meds for one day at one time last week - "I got scolded".   Currently in Pain? No/denies               ADULT SLP TREATMENT - 08/05/16 0001      General Information   Behavior/Cognition Alert;Cooperative;Pleasant mood     Treatment Provided   Treatment provided Cognitive-Linquistic     Cognitive-Linquistic Treatment   Treatment focused on Cognition   Skilled Treatment Given pt's "S" SLP talked about a schedule for pt's meds which she can monitor, as husband provides pt her meds. Pt req'd total A to generate a system to allow her maximum independence possible over med administration without handling meds. Pt agreed pt will make list of her meds and med times and will tell husband what meds to bring to her at the appropriate times (breakfast, lunch, 4pm-alarm will be set). Pt is not to tell husband what to bring her at bedtime due to husband sometimes waking pt to administer meds. SLP then spoke to husband and confimed this plan was feasible,  which he agreed it was.     Assessment / Recommendations / Plan   Plan Continue with current plan of care     Progression Toward Goals   Progression toward goals Progressing toward goals          SLP Education - 08/04/16 1523    Education provided Yes   Education Details Medication administration list for pt to tell husband what meds she needs to have him bring to her   Person(s) Educated Patient;Spouse   Methods Explanation;Handout   Comprehension Verbalized understanding          SLP Short Term Goals - 08/04/16 1527      SLP SHORT TERM GOAL #1   Title Pt will utilize external aids (notebook, timer) to manage schedule, lists, meds with rare min A over 2  sessions   Time 4   Period Weeks   Status On-going     SLP SHORT TERM GOAL #2   Title Pt will alternate attention between 2 simple cognitive lingusitic tasks with 90% on each and occasional min A   Time 4   Period Weeks   Status On-going     SLP SHORT TERM GOAL #3   Title Pt will verbalize 3 cognitive impairments over 2 sessions with rare min A   Time 4   Period Weeks   Status On-going     SLP SHORT TERM GOAL #4   Title Pt will perform mildly complex reasoning, functional math, attention to detail problems with 85% accuracy and occasional min A   Time 4   Period Weeks   Status On-going          SLP Long Term Goals - 08/04/16 1527      SLP LONG TERM GOAL #1   Title Pt will demonstrate anticiaptory awareness self correcting errors on cognitive tasks with occasional min A   Time 8   Period Weeks   Status On-going     SLP LONG TERM GOAL #2   Title Pt will divide attention between 2 simple cognitive lingusitic tasks with 85% on each and occasional min A   Time 8   Period Weeks   Status On-going     SLP LONG TERM GOAL #3   Title Pt will solve moderately complex reasoning, organizing and attention to detail problems with 90% accuracy and rare min A   Time 8   Period Weeks   Status On-going          Plan - 08/04/16 1525    Clinical Impression Statement Pt presents today with cont'd cognitive linguistic deficits. SLP assisted pt today with developing a plan for her to tell husband what meds she needs at med times during the day. Skilled ST remains necessary to improve pt independence.    Speech Therapy Frequency 2x / week   Duration --  8 weeks or 16 visis   Treatment/Interventions Compensatory strategies;Functional tasks;Cueing hierarchy;Patient/family education;Multimodal communcation approach;Internal/external aids;SLP instruction and feedback;Cognitive reorganization;Environmental controls   Potential to Achieve Goals Fair   Potential Considerations Co-morbidities    Consulted and Agree with Plan of Care Patient      Patient will benefit from skilled therapeutic intervention in order to improve the following deficits and impairments:   Cognitive communication deficit    Problem List Patient Active Problem List   Diagnosis Date Noted  . 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 Polyposis syndrome, familial 07/20/2016  . H/O: hysterectomy 07/20/2016  . Leukocytosis   . Monocytosis   .  Acute lower UTI   . GBM (glioblastoma multiforme) ([facility])   . Glioblastoma multiforme of brain ([facility]) 07/11/2016  . MR (mitral regurgitation)   . Brain tumor ([facility])   . Apraxia   . Left-sided weakness   . Steroid-induced hyperglycemia   . Leucocytosis   . Benign essential HTN   . History of gout   . Dysuria   . Acute respiratory failure with hypoxia ([facility])   . Brain mass   . Seizure ([facility]) 06/30/2016  . Status epilepticus ([facility]) 06/30/2016    [place] ,[place], [place]  08/05/2016, 4:02 PM  [facility] [address] [place], [place], [address] Phone: [phone]   Fax:  [phone]   Name: Crystal Parsons MRN: [id number] Date of Birth: 16-Jul-1946

## 2016-08-05 NOTE — Progress Notes (Signed)
  Radiation Oncology         [phone]   Name: Crystal Parsons MRN: [id number]   Date: 08/05/2016  DOB: Jun 11, 1946   Weekly Radiation Therapy Management    ICD-9-CM ICD-10-CM   1. Glioblastoma multiforme of brain ([facility]) 191.9 C71.9     Current Dose: 8 Gy  Planned Dose:  60 Gy  Narrative The patient presents for routine under treatment assessment.  Weight and vitals stable. The patient denies pain. She reports that most mornings she awakens with a frontal headache that resolves 1-2 hours later without intervention. She reports slight dizziness on occasion. Reports fatigue on occasions felt in her knees and lower legs.. Reports diplopia only while wearing her contacts. Participates in PT, OT and speech therapy twice per week. Taking decadron 4 mg bid as directed by Dr. Alvy Bimler. Denies nausea or vomiting. Reports taking Zofran prior to radiation each day. Denies difficulty with word findings. Reports occasional confusion with times and days of the week. Reports normal long term memory. Reports irritability. Reports fatigue.  The patient is without complaint. Set-up films were reviewed. The chart was checked.  Physical Findings  weight is 193 lb 12.8 oz (87.9 kg). Her blood pressure is 142/61 (abnormal) and her pulse is 87. Her respiration is 18 and oxygen saturation is 100%. . Weight essentially stable.  No significant changes.  Impression The patient is tolerating radiation.  Plan Continue treatment as planned. I discussed the patient's steroid and encouraged her to continue taking the prescribed dose; we will taper her dose at a later date.         Sheral Apley Tammi Klippel, M.D.  This document serves as a record of services personally performed by Tyler Pita, MD. It was created on his behalf by Maryla Morrow, a trained medical scribe. The creation of this record is based on the scribe's personal observations and the provider's statements to them. This document has been checked and  approved by the attending provider.

## 2016-08-05 NOTE — Progress Notes (Signed)
Weight and vitals stable. Denies pain. Frontal scalp incision well healed without redness, drainage or edema. Reports most mornings she awakes with a frontal headache that resolves 1-2 hours later without intervention. Reports slight dizziness on occasion. Reports fatigue on occasions felt in her knees and lower legs.. Reports diplopia only while wearing her contacts. Participates in PT, OT and speech therapy twice per week. Taking decadron 4 mg bid as directed by Dr. Alvy Bimler. No evidence of thrush noted. Denies nausea or vomiting. Reports taking Zofran prior to radiation each day. Denies difficulty with word findings. Reports occasional confusion with times and days of the week. Reports normal long term memory. Reports irritability. Reports fatigue.   BP (!) 142/61 (BP Location: Right Arm, Patient Position: Sitting, Cuff Size: Normal)   Pulse 87   Resp 18   Wt 193 lb 12.8 oz (87.9 kg)   SpO2 100%   BMI 34.33 kg/m  Wt Readings from Last 3 Encounters:  08/05/16 193 lb 12.8 oz (87.9 kg)  07/22/16 179 lb 0.2 oz (81.2 kg)  07/22/16 179 lb 0.2 oz (81.2 kg)

## 2016-08-08 ENCOUNTER — Ambulatory Visit
Admission: RE | Admit: 2016-08-08 | Discharge: 2016-08-08 | Disposition: A | Discharge status: Home / Self Care | Payer: Medicare Other | Source: Ambulatory Visit | Attending: Radiation Oncology | Admitting: Radiation Oncology

## 2016-08-08 ENCOUNTER — Other Ambulatory Visit (HOSPITAL_BASED_OUTPATIENT_CLINIC_OR_DEPARTMENT_OTHER): Payer: Medicare Other

## 2016-08-08 ENCOUNTER — Ambulatory Visit: Payer: Medicare Other | Admitting: Speech Pathology

## 2016-08-08 ENCOUNTER — Encounter: Payer: Self-pay | Admitting: Speech Pathology

## 2016-08-08 DIAGNOSIS — R41841 Cognitive communication deficit: Secondary | ICD-10-CM

## 2016-08-08 DIAGNOSIS — C719 Malignant neoplasm of brain, unspecified: Secondary | ICD-10-CM

## 2016-08-08 DIAGNOSIS — C711 Malignant neoplasm of frontal lobe: Secondary | ICD-10-CM

## 2016-08-08 DIAGNOSIS — R278 Other lack of coordination: Secondary | ICD-10-CM | POA: Diagnosis not present

## 2016-08-08 DIAGNOSIS — R41842 Visuospatial deficit: Secondary | ICD-10-CM | POA: Diagnosis not present

## 2016-08-08 DIAGNOSIS — G8194 Hemiplegia, unspecified affecting left nondominant side: Secondary | ICD-10-CM | POA: Diagnosis not present

## 2016-08-08 DIAGNOSIS — M6281 Muscle weakness (generalized): Secondary | ICD-10-CM | POA: Diagnosis not present

## 2016-08-08 DIAGNOSIS — R41844 Frontal lobe and executive function deficit: Secondary | ICD-10-CM | POA: Diagnosis not present

## 2016-08-08 DIAGNOSIS — Z510 Encounter for antineoplastic radiation therapy: Secondary | ICD-10-CM | POA: Diagnosis not present

## 2016-08-08 DIAGNOSIS — R2689 Other abnormalities of gait and mobility: Secondary | ICD-10-CM | POA: Diagnosis not present

## 2016-08-08 LAB — CBC WITH DIFFERENTIAL/PLATELET
BASO%: 0.1 % (ref 0.0–2.0)
Basophils Absolute: 0 10*3/uL (ref 0.0–0.1)
EOS ABS: 0 10*3/uL (ref 0.0–0.5)
EOS%: 0 % (ref 0.0–7.0)
HEMATOCRIT: 37.3 % (ref 34.8–46.6)
HEMOGLOBIN: 12.5 g/dL (ref 11.6–15.9)
LYMPH#: 1.1 10*3/uL (ref 0.9–3.3)
LYMPH%: 7.1 % — ABNORMAL LOW (ref 14.0–49.7)
MCH: 30.4 pg (ref 25.1–34.0)
MCHC: 33.5 g/dL (ref 31.5–36.0)
MCV: 90.8 fL (ref 79.5–101.0)
MONO#: 1 10*3/uL — ABNORMAL HIGH (ref 0.1–0.9)
MONO%: 6.4 % (ref 0.0–14.0)
NEUT%: 86.4 % — ABNORMAL HIGH (ref 38.4–76.8)
NEUTROS ABS: 12.9 10*3/uL — AB (ref 1.5–6.5)
Platelets: 271 10*3/uL (ref 145–400)
RBC: 4.11 10*6/uL (ref 3.70–5.45)
RDW: 14.5 % (ref 11.2–14.5)
WBC: 14.9 10*3/uL — AB (ref 3.9–10.3)

## 2016-08-08 LAB — COMPREHENSIVE METABOLIC PANEL
ALBUMIN: 3.6 g/dL (ref 3.5–5.0)
ALK PHOS: 78 U/L (ref 40–150)
ALT: 59 U/L — ABNORMAL HIGH (ref 0–55)
AST: 17 U/L (ref 5–34)
Anion Gap: 12 mEq/L — ABNORMAL HIGH (ref 3–11)
BILIRUBIN TOTAL: 0.43 mg/dL (ref 0.20–1.20)
BUN: 29.1 mg/dL — AB (ref 7.0–26.0)
CO2: 21 mEq/L — ABNORMAL LOW (ref 22–29)
Calcium: 9.3 mg/dL (ref 8.4–10.4)
Chloride: 106 mEq/L (ref 98–109)
Creatinine: 0.8 mg/dL (ref 0.6–1.1)
EGFR: 73 mL/min/{1.73_m2} — ABNORMAL LOW (ref >90)
GLUCOSE: 151 mg/dL — AB (ref 70–140)
Potassium: 4 mEq/L (ref 3.5–5.1)
Sodium: 138 mEq/L (ref 136–145)
TOTAL PROTEIN: 6.7 g/dL (ref 6.4–8.3)

## 2016-08-08 MED ORDER — BIAFINE EX EMUL
Freq: Every day | CUTANEOUS | Status: DC
  Administered 2016-08-08: 11:00:00 via TOPICAL

## 2016-08-08 NOTE — Therapy (Signed)
[facility] [address] [place], [place], [address] Phone: [phone]   Fax:  [phone]  Speech Language Pathology Treatment  Patient Details  Name: Crystal Parsons MRN: [id number] Date of Birth: Oct 16, 1945 Referring Provider: Dr. Delice Lesch  Encounter Date: 08/08/2016      End of Session - 08/08/16 1538    Visit Number 3   Number of Visits 17   Date for SLP Re-Evaluation 09/21/16   SLP Start Time 1450   SLP Stop Time  1536   SLP Time Calculation (min) 46 min   Activity Tolerance Patient tolerated treatment well      Past Medical History:  Diagnosis Date  . Anxiety   . Bradycardia    At times with pulse in the 40s  . Brain cancer ([facility])    Glioblastoma  . Diarrhea    With blating, improved with gluten-free diet  . Diastolic dysfunction   . Essential hypertension, benign    Always has HTN when at the doctor's office.  . Glioblastoma multiforme of brain ([facility]) 07/11/2016  . Gout   . Hyperlipidemia   . Lynch syndrome   . Mild aortic sclerosis ([facility])   . Mitral valve problem    Mildly thickened mitral valve  . Mitral valve prolapse    Mild, anterior  . MR (mitral regurgitation)    ECHO 08/10/09 shows mild MR again and normal EF. No significant changes from prior ECHO.  Mar[place] Kitchen Palpitations    Occasional, but are not significant. ECHO 08/20/08 - Normal EF (60%), mildly thickened mitral valve with mild anterior mitral valve prolapse, mild MR, mild aortic sclerosis, grade 1 diastolic dysfunction.  . Proteinuria    Likely secondary to Diabetes    Past Surgical History:  Procedure Laterality Date  . ABDOMINAL HYSTERECTOMY    . APPLICATION OF CRANIAL NAVIGATION Right 07/07/2016   Procedure: APPLICATION OF CRANIAL NAVIGATION;  Surgeon: Consuella Lose, MD;  Location: [facility];  Service: Neurosurgery;  Laterality: Right;  . BREAST LUMPECTOMY Left   . CRANIOTOMY Right 07/07/2016   Procedure: CRANIOTOMY TUMOR EXCISION  w/BrainLab;  Surgeon: Consuella Lose, MD;  Location: [facility];  Service: Neurosurgery;  Laterality: Right;  . HEMORROIDECTOMY  2009  . TUBAL LIGATION      There were no vitals filed for this visit.      Subjective Assessment - 08/08/16 1459    Subjective Pt states she has been using new system for medications, that it is working well and she feels she has more control.   Currently in Pain? No/denies               ADULT SLP TREATMENT - 08/08/16 0001      General Information   Behavior/Cognition Alert;Cooperative;Pleasant mood   Patient Positioning Upright in chair   Oral care provided N/A     Treatment Provided   Treatment provided Cognitive-Linquistic     Cognitive-Linquistic Treatment   Treatment focused on Cognition   Skilled Treatment Targeted alternating attention via cognitive-linguistic tasks. Patient observed to use inconsistent methods in visual scanning when executing a categorization task. She is noted to have difficulty tracking progress through tasks, leading to incomplete work. Patient demonstrated reduced awareness of errors and need for rechecking for accuracy. With min A, she identified solutions and strategies to improve accuracy. She benefitted from reducing visual distractions, and required mod-max A (visual and verbal cues) to correctly implement strategies.     Assessment / Recommendations / Plan   Plan  Continue with current plan of care     Progression Toward Goals   Progression toward goals Progressing toward goals          SLP Education - 08/08/16 1535    Education provided Yes   Education Details using visuals to track progress, making lists at home for completion of daily tasks   Person(s) Educated Patient;Spouse   Methods Explanation   Comprehension Verbalized understanding;Need further instruction  patient requires further instruction for carryover          SLP Short Term Goals - 08/08/16 1540      SLP SHORT TERM GOAL #1   Title  Pt will utilize external aids (notebook, timer) to manage schedule, lists, meds with rare min A over 2 sessions   Time 3   Period Weeks   Status On-going     SLP SHORT TERM GOAL #2   Title Pt will alternate attention between 2 simple cognitive lingusitic tasks with 90% on each and occasional min A   Time 3   Period Weeks   Status On-going     SLP SHORT TERM GOAL #3   Title Pt will verbalize 3 cognitive impairments over 2 sessions with rare min A   Time 3   Period Weeks   Status On-going     SLP SHORT TERM GOAL #4   Title Pt will perform mildly complex reasoning, functional math, attention to detail problems with 85% accuracy and occasional min A   Time 3   Period Weeks   Status On-going          SLP Long Term Goals - 08/08/16 1541      SLP LONG TERM GOAL #1   Title Pt will demonstrate anticiaptory awareness self correcting errors on cognitive tasks with occasional min A   Time 7   Period Weeks   Status On-going     SLP LONG TERM GOAL #2   Title Pt will divide attention between 2 simple cognitive lingusitic tasks with 85% on each and occasional min A   Time 7   Period Weeks   Status On-going     SLP LONG TERM GOAL #3   Title Pt will solve moderately complex reasoning, organizing and attention to detail problems with 90% accuracy and rare min A   Time 7   Period Weeks   Status On-going          Plan - 08/08/16 1539    Clinical Impression Statement Pt presents today with cont'd cognitive linguistic deficits. SLP assisted pt today with developing a plan for creating a checklist at home for daily tasks. Skilled ST remains necessary to improve pt independence.    Speech Therapy Frequency 2x / week   Duration --  8 weeks or 16 visis   Treatment/Interventions Compensatory strategies;Functional tasks;Cueing hierarchy;Patient/family education;Multimodal communcation approach;Internal/external aids;SLP instruction and feedback;Cognitive reorganization;Environmental  controls   Potential to Achieve Goals Fair   Potential Considerations Co-morbidities   Consulted and Agree with Plan of Care Patient      Patient will benefit from skilled therapeutic intervention in order to improve the following deficits and impairments:   Cognitive communication deficit    Problem List Patient Active Problem List   Diagnosis Date Noted  . Malignant frontal lobe tumor ([facility])   . Polyposis syndrome, familial 07/20/2016  . H/O: hysterectomy 07/20/2016  . Leukocytosis   . Monocytosis   . Acute lower UTI   . GBM (glioblastoma multiforme) ([facility])   . Glioblastoma multiforme  of brain ([facility]) 07/11/2016  . MR (mitral regurgitation)   . Brain tumor ([facility])   . Apraxia   . Left-sided weakness   . Steroid-induced hyperglycemia   . Leucocytosis   . Benign essential HTN   . History of gout   . Dysuria   . Acute respiratory failure with hypoxia ([facility])   . Brain mass   . Seizure ([facility]) 06/30/2016  . Status epilepticus ([facility]) 06/30/2016   Deneise Lever, MS CF-SLP Speech-Language Pathologist  Aliene Altes 08/08/2016, 3:42 PM  [facility] [address] [place], [place], [address] Phone: [phone]   Fax:  [phone]   Name: Crystal Parsons MRN: [id number] Date of Birth: 07/14/1946

## 2016-08-08 NOTE — Patient Instructions (Signed)
Create a checklist of daily tasks you must complete

## 2016-08-08 NOTE — Addendum Note (Signed)
Encounter addended by: Heywood Footman, RN on: 08/08/2016 11:18 AM<BR>    Actions taken: Patient Education assessment filed

## 2016-08-09 ENCOUNTER — Ambulatory Visit (HOSPITAL_BASED_OUTPATIENT_CLINIC_OR_DEPARTMENT_OTHER): Payer: Medicare Other | Admitting: Hematology and Oncology

## 2016-08-09 ENCOUNTER — Ambulatory Visit
Admission: RE | Admit: 2016-08-09 | Discharge: 2016-08-09 | Disposition: A | Discharge status: Home / Self Care | Payer: Medicare Other | Source: Ambulatory Visit | Attending: Radiation Oncology | Admitting: Radiation Oncology

## 2016-08-09 ENCOUNTER — Encounter: Payer: Self-pay | Admitting: Hematology and Oncology

## 2016-08-09 DIAGNOSIS — K5909 Other constipation: Secondary | ICD-10-CM | POA: Insufficient documentation

## 2016-08-09 DIAGNOSIS — C711 Malignant neoplasm of frontal lobe: Secondary | ICD-10-CM

## 2016-08-09 DIAGNOSIS — C719 Malignant neoplasm of brain, unspecified: Secondary | ICD-10-CM | POA: Diagnosis not present

## 2016-08-09 DIAGNOSIS — R531 Weakness: Secondary | ICD-10-CM

## 2016-08-09 DIAGNOSIS — Z510 Encounter for antineoplastic radiation therapy: Secondary | ICD-10-CM | POA: Diagnosis not present

## 2016-08-09 NOTE — Assessment & Plan Note (Signed)
This is improving on treatment. Continue therapy

## 2016-08-09 NOTE — Progress Notes (Signed)
[facility] OFFICE PROGRESS NOTE  Patient Care Team: Lavone Orn, MD as PCP - General (Internal Medicine)  SUMMARY OF ONCOLOGIC HISTORY:   Glioblastoma multiforme of brain [facility])   06/30/2016 - 07/10/2016 Hospital Admission    The patient was admitted to the hospital after presentation with seizure. She was subsequently found to have brain tumor and underwent primary resection. She was subsequently discharged to rehabilitation facility      07/01/2016 Imaging    CT head showed mass lesion within the right frontal lobe measuring up to 4.8 cm. 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 However, a cerebral abscess may have a similar appearance. MRI with and without contrast is recommended for further characterization. 2. No significant mass effect or midline shift.  No hydrocephalus.      07/01/2016 Imaging    Ct chest showed right parahilar density appears to be atelectasis or possible infiltrate but no mass is identified. There is also streaky bibasilar subsegmental atelectasis and very small pleural effusions. 2. No mediastinal or hilar mass or adenopathy. Scattered lymph nodes are noted.      07/01/2016 Imaging    MRI brain showed Motion degraded examination. 3 x 4.3 x 4.5 cm complex RIGHT frontal lobe mass with imaging characteristics of primary brain tumor. 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 Local edema versus nonenhancing infiltrative tumor results in 2 mm RIGHT to LEFT midline shift. No ventricular entrapment.      07/07/2016 Imaging    Interval RIGHT craniotomy for resection of dominant RIGHT frontal lobe tumor without convincing evidence of residual local disease. Intraventricular extension of blood products without hydrocephalus. 2 mm RIGHT to LEFT midline shift without ventricular entrapment. Residual subcentimeter RIGHT frontal lobe satellite nodule consistent  with tumor.      07/07/2016 Surgery    Dr. Kathyrn Sheriff performed stereotactic right frontal craniotomy for resection of tumor       07/07/2016 Pathology Results    Accession: [id number] Brain, for tumor resection, Right Frontal Lobe - GLIOBLASTOMA MULTIFORME WHO GRADE IV/IV. - SEE ONCOLOGY TABLE BELOW. Microscopic Comment ONCOLOGY TABLE - BRAIN AND SPINAL CORD 1. Procedure: Resection 2. Tumor site, including laterality: Right frontal lobe 3. Maximum tumor size (cm): At least 4.8 cm (gross measurement) 4. Histologic type: Glioblastoma multiforme 5. Grade: WHO grade IV/IV 6. Margins (if applicable): Can not be assessed 7. Ancillary studies: Per protocol, a block will be sent for MGMT and IDH1/2 testing and the results reported      08/02/2016 -  Radiation Therapy    She started concurrent chemo/RT      08/02/2016 -  Chemotherapy    She started concurrent chemo/RT       INTERVAL HISTORY: Please see below for problem oriented charting. She returns with her husband She is doing well. Her only side effects is recent constipation She had very mild headaches occasionally She is undergoing speech therapy, occupational therapy and physical therapy twice a week Denies recurrent seizures or progressive neurological deficit  REVIEW OF SYSTEMS:   Constitutional: Denies fevers, chills or abnormal weight loss Eyes: Denies blurriness of vision Ears, nose, mouth, throat, and face: Denies mucositis or sore throat Respiratory: Denies cough, dyspnea or wheezes Cardiovascular: Denies palpitation, chest discomfort or lower extremity swelling Gastrointestinal:  Denies nausea, heartburn or change in bowel habits Skin: Denies abnormal skin rashes Lymphatics: Denies new lymphadenopathy or easy bruising Neurological:Denies numbness, tingling or  new weaknesses Behavioral/Psych: Mood is stable, no new changes  All other systems were reviewed with the patient and are negative.  I have reviewed the  past medical history, past surgical history, social history and family history with the patient and they are unchanged from previous note.  ALLERGIES:  is allergic to accupril [quinapril hcl]; latex; vitamin e; naproxen; and zoloft [sertraline hcl].  MEDICATIONS:  Current Outpatient Prescriptions  Medication Sig Dispense Refill  . Cholecalciferol (VITAMIN D) 2000 UNITS tablet Take 2,000 Units by mouth daily.    Marland Kitchen dexamethasone (DECADRON) 4 MG tablet Take 1 tablet (4 mg total) by mouth every 8 (eight) hours. 90 tablet 0  . famotidine (PEPCID) 20 MG tablet Take 1 tablet (20 mg total) by mouth 2 (two) times daily. 60 tablet 0  . levETIRAcetam (KEPPRA) 1000 MG tablet Take 1 tablet (1,000 mg total) by mouth 2 (two) times daily. 60 tablet 0  . losartan-hydrochlorothiazide (HYZAAR) 50-12.5 MG tablet Take 1 tablet by mouth daily. 30 tablet 0  . Multiple Vitamin (MULTIVITAMIN) tablet Take 1 tablet by mouth daily.    . ondansetron (ZOFRAN) 8 MG tablet Take 1 tablet (8 mg total) by mouth every 8 (eight) hours as needed for nausea. 60 tablet 3  . senna-docusate (SENOKOT-S) 8.6-50 MG tablet Take 2 tablets by mouth at bedtime. 100 tablet 0  . sulfamethoxazole-trimethoprim (BACTRIM DS,SEPTRA DS) 800-160 MG tablet Take 1 tablet by mouth 3 (three) times a week. 12 tablet 11  . temozolomide (TEMODAR) 140 MG capsule Take 1 capsule (140 mg total) by mouth daily. May take on an empty stomach or at bedtime to decrease nausea & vomiting. 42 capsule 0   No current facility-administered medications for this visit.    Facility-Administered Medications Ordered in Other Visits  Medication Dose Route Frequency Provider Last Rate Last Dose  . topical emolient (BIAFINE) emulsion   Topical Daily Tyler Pita, MD        PHYSICAL EXAMINATION: ECOG PERFORMANCE STATUS: 1 - Symptomatic but completely ambulatory  Vitals:   08/09/16 1343  BP: (!) 123/53  Pulse: 95  Resp: 18  Temp: 97.9 F (36.6 C)   Filed Weights    08/09/16 1343  Weight: 190 lb 6.4 oz (86.4 kg)    GENERAL:alert, no distress and comfortable SKIN: skin color, texture, turgor are normal, no rashes or significant lesions EYES: normal, Conjunctiva are pink and non-injected, sclera clear OROPHARYNX:no exudate, no erythema and lips, buccal mucosa, and tongue normal  NECK: supple, thyroid normal size, non-tender, without nodularity LYMPH:  no palpable lymphadenopathy in the cervical, axillary or inguinal LUNGS: clear to auscultation and percussion with normal breathing effort HEART: regular rate & rhythm and no murmurs and no lower extremity edema ABDOMEN:abdomen soft, non-tender and normal bowel sounds Musculoskeletal:no cyanosis of digits and no clubbing  NEURO: alert & oriented x 3 with fluent speech, no focal motor/sensory deficits  LABORATORY DATA:  I have reviewed the data as listed    Component Value Date/Time   NA 138 08/08/2016 1237   K 4.0 08/08/2016 1237   CL 103 07/18/2016 0439   CO2 21 (L) 08/08/2016 1237   GLUCOSE 151 (H) 08/08/2016 1237   BUN 29.1 (H) 08/08/2016 1237   CREATININE 0.8 08/08/2016 1237   CALCIUM 9.3 08/08/2016 1237   PROT 6.7 08/08/2016 1237   ALBUMIN 3.6 08/08/2016 1237   AST 17 08/08/2016 1237   ALT 59 (H) 08/08/2016 1237   ALKPHOS 78 08/08/2016 1237   BILITOT 0.43 08/08/2016 1237  GFRNONAA >60 07/18/2016 0439   GFRAA >60 07/18/2016 0439    No results found for: SPEP, UPEP  Lab Results  Component Value Date   WBC 14.9 (H) 08/08/2016   NEUTROABS 12.9 (H) 08/08/2016   HGB 12.5 08/08/2016   HCT 37.3 08/08/2016   MCV 90.8 08/08/2016   PLT 271 08/08/2016      Chemistry      Component Value Date/Time   NA 138 08/08/2016 1237   K 4.0 08/08/2016 1237   CL 103 07/18/2016 0439   CO2 21 (L) 08/08/2016 1237   BUN 29.1 (H) 08/08/2016 1237   CREATININE 0.8 08/08/2016 1237      Component Value Date/Time   CALCIUM 9.3 08/08/2016 1237   ALKPHOS 78 08/08/2016 1237   AST 17 08/08/2016 1237    ALT 59 (H) 08/08/2016 1237   BILITOT 0.43 08/08/2016 1237       ASSESSMENT & PLAN:  Glioblastoma multiforme of brain ([place]) So far, she tolerated treatment well I would keep at current dose of dexamethasone with plan for future taper She will continue to take Zofran before treatment to prevent nausea She will continue antimicrobial prophylaxis with Bactrim She will continue antiseizure medications I reinforced the importance of physical therapy and rehabilitation Her case was recently presented at the CNS oncology tumor board She will qualify from genetic counseling plus minus testing I will refer her to see our genetic counselor   Left-sided weakness This is improving on treatment. Continue therapy  Other constipation This is likely triggered by medication side effects I recommend conservative management with stool softener and laxatives as needed   Orders Placed This Encounter  Procedures  . Ambulatory referral to Genetics    Referral Priority:   Routine    Referral Type:   Consultation    Referral Reason:   Specialty Services Required    Number of Visits Requested:   1   All questions were answered. The patient knows to call the clinic with any problems, questions or concerns. No barriers to learning was detected. I spent 15 minutes counseling the patient face to face. The total time spent in the appointment was 20 minutes and more than 50% was on counseling and review of test results     Heath Lark, MD 08/09/2016 6:37 PM

## 2016-08-09 NOTE — Assessment & Plan Note (Signed)
This is likely triggered by medication side effects I recommend conservative management with stool softener and laxatives as needed

## 2016-08-09 NOTE — Assessment & Plan Note (Signed)
So far, she tolerated treatment well I would keep at current dose of dexamethasone with plan for future taper She will continue to take Zofran before treatment to prevent nausea She will continue antimicrobial prophylaxis with Bactrim She will continue antiseizure medications I reinforced the importance of physical therapy and rehabilitation Her case was recently presented at the CNS oncology tumor board She will qualify from genetic counseling plus minus testing I will refer her to see our genetic counselor

## 2016-08-10 ENCOUNTER — Telehealth: Payer: Self-pay | Admitting: Hematology and Oncology

## 2016-08-10 ENCOUNTER — Other Ambulatory Visit: Payer: Self-pay | Admitting: *Deleted

## 2016-08-10 ENCOUNTER — Ambulatory Visit: Payer: Medicare Other | Admitting: Physical Therapy

## 2016-08-10 ENCOUNTER — Ambulatory Visit
Admission: RE | Admit: 2016-08-10 | Discharge: 2016-08-10 | Disposition: A | Discharge status: Home / Self Care | Payer: Medicare Other | Source: Ambulatory Visit | Attending: Radiation Oncology | Admitting: Radiation Oncology

## 2016-08-10 ENCOUNTER — Ambulatory Visit: Payer: Medicare Other | Admitting: Occupational Therapy

## 2016-08-10 ENCOUNTER — Encounter: Payer: Self-pay | Admitting: Physical Therapy

## 2016-08-10 DIAGNOSIS — M6281 Muscle weakness (generalized): Secondary | ICD-10-CM

## 2016-08-10 DIAGNOSIS — G8194 Hemiplegia, unspecified affecting left nondominant side: Secondary | ICD-10-CM

## 2016-08-10 DIAGNOSIS — R2689 Other abnormalities of gait and mobility: Secondary | ICD-10-CM | POA: Diagnosis not present

## 2016-08-10 DIAGNOSIS — R278 Other lack of coordination: Secondary | ICD-10-CM | POA: Diagnosis not present

## 2016-08-10 DIAGNOSIS — R41842 Visuospatial deficit: Secondary | ICD-10-CM | POA: Diagnosis not present

## 2016-08-10 DIAGNOSIS — C719 Malignant neoplasm of brain, unspecified: Secondary | ICD-10-CM | POA: Diagnosis not present

## 2016-08-10 DIAGNOSIS — Z510 Encounter for antineoplastic radiation therapy: Secondary | ICD-10-CM | POA: Diagnosis not present

## 2016-08-10 DIAGNOSIS — R41844 Frontal lobe and executive function deficit: Secondary | ICD-10-CM

## 2016-08-10 DIAGNOSIS — C711 Malignant neoplasm of frontal lobe: Secondary | ICD-10-CM | POA: Diagnosis not present

## 2016-08-10 DIAGNOSIS — I10 Essential (primary) hypertension: Secondary | ICD-10-CM | POA: Diagnosis not present

## 2016-08-10 DIAGNOSIS — R569 Unspecified convulsions: Secondary | ICD-10-CM | POA: Diagnosis not present

## 2016-08-10 NOTE — Telephone Encounter (Signed)
left voicemail message to confirm patient appointment on 08/15/16 at 12:45pm

## 2016-08-10 NOTE — Patient Instructions (Signed)
1. Grip Strengthening (Resistive Putty)   Squeeze putty using thumb and all fingers. Repeat _20___ times. Do __2__ sessions per day.   2. Roll putty into tube on table and pinch between each finger and thumb x 10 reps each. (can do ring and small finger together)     Copyright  VHI. All rights reserved.      Coordination Activities  Perform the following activities for 20  minutes 1  times per day with left hand(s).   Rotate ball in fingertips (clockwise and counter-clockwise).  Toss ball between hands.  Toss ball in air and catch with the same hand.  Flip cards 1 at a time as fast as you can.  Deal cards with your thumb (Hold deck in hand and push card off top with thumb).  Pick up coins and place in container or coin bank.  Pick up coins and stack.  Pick up coins one at a time until you get 5-10 in your hand, then move coins from palm to fingertips to stack one at a time.

## 2016-08-10 NOTE — Therapy (Signed)
[facility] [address] [place], [place], [address] Phone: [phone]   Fax:  [phone]  Physical Therapy Treatment  Patient Details  Name: Crystal Parsons MRN: [id number] Date of Birth: [birth year] Referring Provider: Dr. Posey Pronto  Encounter Date: 08/10/2016      PT End of Session - 08/10/16 1013    Visit Number 2   Number of Visits 9   Date for PT Re-Evaluation 09/01/16   Authorization Type Medicare primary and BCBS secondary. G-CODE AND PROGRESS NOTE EVERY 10TH VISIT.    PT Start Time [phone]   PT Stop Time 1013   PT Time Calculation (min) 40 min   Activity Tolerance Patient tolerated treatment well   Behavior During Therapy WFL for tasks assessed/performed      Past Medical History:  Diagnosis Date  . Anxiety   . Bradycardia    At times with pulse in the 40s  . Brain cancer ([place])    Glioblastoma  . Diarrhea    With blating, improved with gluten-free diet  . Diastolic dysfunction   . Essential hypertension, benign    Always has HTN when at the doctor's office.  . Glioblastoma multiforme of brain ([place]) 07/11/2016  . Gout   . Hyperlipidemia   . Lynch syndrome   . Mild aortic sclerosis ([place])   . Mitral valve problem    Mildly thickened mitral valve  . Mitral valve prolapse    Mild, anterior  . MR (mitral regurgitation)    ECHO 08/10/09 shows mild MR again and normal EF. No significant changes from prior ECHO.  Marland Kitchen Palpitations    Occasional, but are not significant. ECHO 08/20/08 - Normal EF (60%), mildly thickened mitral valve with mild anterior mitral valve prolapse, mild MR, mild aortic sclerosis, grade 1 diastolic dysfunction.  . Proteinuria    Likely secondary to Diabetes    Past Surgical History:  Procedure Laterality Date  . ABDOMINAL HYSTERECTOMY    . APPLICATION OF CRANIAL NAVIGATION Right 07/07/2016   Procedure: APPLICATION OF CRANIAL NAVIGATION;  Surgeon: Consuella Lose, MD;  Location: [facility];  Service: Neurosurgery;  Laterality: Right;  . BREAST LUMPECTOMY Left   . CRANIOTOMY Right 07/07/2016   Procedure: CRANIOTOMY TUMOR EXCISION w/BrainLab;  Surgeon: Consuella Lose, MD;  Location: [facility];  Service: Neurosurgery;  Laterality: Right;  . HEMORROIDECTOMY  2009  . TUBAL LIGATION      There were no vitals filed for this visit.      Subjective Assessment - 08/10/16 0936    Subjective Pt has gotten out into the community a couple times since last visit, got extremely tired walking at the grocery store (late afternoon).   Currently in Pain? No/denies                         [facility] Adult PT Treatment/Exercise - 08/10/16 0001      Ambulation/Gait   Ambulation/Gait Yes   Ambulation/Gait Assistance 5: Supervision   Ambulation/Gait Assistance Details 6 MWT no rest breaks   Ambulation Distance (Feet) 1142 Feet   Assistive device None   Gait Pattern Step-through pattern;Decreased stride length   Ambulation Surface Level;Indoor             Balance Exercises - 08/10/16 1235      Balance Exercises: Standing   Tandem Stance Eyes open;Intermittent upper extremity support;2 reps;30 secs  switching feet in front.   SLS Eyes open;3 reps;15 secs;Intermittent upper extremity support  x3 each LE   Sit to Stand Time worked on sit<>stands without UE support 10x2 cues to increase forward trunk lean for greater control of movement; no LOB.           PT Education - 08/10/16 1237    Education provided Yes   Education Details Initiated HEP for standing balance, LE strength, and walking program.   Person(s) Educated Patient;Spouse   Methods Explanation;Demonstration;Verbal cues;Handout   Comprehension Verbalized understanding;Returned demonstration;Need further instruction          PT Short Term Goals - 08/02/16 1334      PT SHORT TERM GOAL #1   Title same as LTGs            PT Long Term Goals - 08/02/16 1334      PT LONG TERM GOAL #1   Title  Pt will be IND in HEP to improve endurance, strength, and balance. TARGET DATE FOR ALL LTGS: 08/30/16   Status New     PT LONG TERM GOAL #2   Title Pt will improve FGA score to >/=25/30 to decr. falls risk.    Status New     PT LONG TERM GOAL #3   Title Pt will improve gait speed to >/=3.64ft/sec., no AD, to amb. safely in the community.    Status New     PT LONG TERM GOAL #4   Title Pt will amb. 1000' over even/uneven terrain, IND, in order to improve functional mobility.    Status New     PT LONG TERM GOAL #5   Title Perform 6MWT and write goal as indicated.    Status New               Plan - 08/10/16 1239    Clinical Impression Statement Ambulated [id number] ft in 6min walk test without rest breaks or with AD. Pt reported being challenged with 6 min walk with some fatigue.  Initiated HEP for standing balance, LE strength, and walking program.  Pt requires intermittent UE support for standing balance with narrow BOS.                                              Rehab Potential Good   Clinical Impairments Affecting Rehab Potential GBM   PT Frequency 2x / week   PT Duration 4 weeks   PT Treatment/Interventions ADLs/Self Care Home Management;Biofeedback;DME Instruction;Gait training;Stair training;Functional mobility training;Therapeutic activities;Therapeutic exercise;Balance training;Neuromuscular re-education;Manual techniques;Orthotic Fit/Training;Patient/family education;Vestibular   PT Next Visit Plan review HEP, dynamic standing balance/stepping strategies.   Consulted and Agree with Plan of Care Patient      Patient will benefit from skilled therapeutic intervention in order to improve the following deficits and impairments:  Abnormal gait, Decreased endurance, Decreased knowledge of use of DME, Decreased balance, Decreased mobility, Decreased cognition, Impaired flexibility  Visit Diagnosis: Muscle weakness (generalized)  Other abnormalities of gait and  mobility     Problem List Patient Active Problem List   Diagnosis Date Noted  . Other constipation 08/09/2016  . Malignant frontal lobe tumor ([place])   . Polyposis syndrome, familial 07/20/2016  . H/O: hysterectomy 07/20/2016  . Leukocytosis   . Monocytosis   . Acute lower UTI   . GBM (glioblastoma multiforme) ([place])   . Glioblastoma multiforme of brain ([place]) 07/11/2016  . MR (mitral regurgitation)   . Brain tumor ([place])   .  Apraxia   . Left-sided weakness   . Steroid-induced hyperglycemia   . Leucocytosis   . Benign essential HTN   . History of gout   . Dysuria   . Acute respiratory failure with hypoxia ([place])   . Brain mass   . Seizure ([place]) 06/30/2016  . Status epilepticus ([place]) 06/30/2016    Bjorn Loser, PTA  08/10/16, 12:47 PM [facility] [address] [place], [place], [address] Phone: [phone]   Fax:  [phone]  Name: JAUNITA SZCZEPANIAK MRN: [id number] Date of Birth: [birth year]

## 2016-08-10 NOTE — Patient Instructions (Addendum)
Functional Quadriceps: Sit to Stand    Sit on edge of chair, feet flat on floor. Stand upright, extending knees fully. Repeat __10__ times per set. Do _2___ sets per session. Do _1-2___ sessions per day.   Tandem Stance    Right foot in front of left, heel touching toe both feet "straight ahead". Stand on Foot Triangle of Support with both feet. Balance in this position _3-__ seconds. Do with left foot in front of right. x2  Copyright  VHI. All rights reserved.     Single Leg - Eyes Open    Holding support, lift right leg while maintaining balance over other leg. Progress to removing hands from support surface for longer periods of time. Hold_15___ seconds. Repeat __3__ times each leg per session. Do __1-2__ sessions per day.  Copyright  VHI. All rights reserved.      WALKING  Walking is a great form of exercise to increase your strength, endurance and overall fitness.  A walking program can help you start slowly and gradually build endurance as you go.  Everyone's ability is different, so each person's starting point will be different.  You do not have to follow them exactly.  The are just samples. You should simply find out what's right for you and stick to that program.   In the beginning, you'll start off walking 2-3 times a day for short distances.  As you get stronger, you'll be walking further at just 1-2 times per day.  A. You Can Walk For A Certain Length Of Time Each Day    Walk 6 minutes 3 times per day.  Increase 1-2 minutes every 1-2 weeks (3 times per day).  Work up to 25-30 minutes (1-2 times per day).

## 2016-08-11 ENCOUNTER — Ambulatory Visit
Admission: RE | Admit: 2016-08-11 | Discharge: 2016-08-11 | Disposition: A | Discharge status: Home / Self Care | Payer: Medicare Other | Source: Ambulatory Visit | Attending: Radiation Oncology | Admitting: Radiation Oncology

## 2016-08-11 DIAGNOSIS — C711 Malignant neoplasm of frontal lobe: Secondary | ICD-10-CM | POA: Diagnosis not present

## 2016-08-11 DIAGNOSIS — Z510 Encounter for antineoplastic radiation therapy: Secondary | ICD-10-CM | POA: Diagnosis not present

## 2016-08-11 DIAGNOSIS — C719 Malignant neoplasm of brain, unspecified: Secondary | ICD-10-CM | POA: Diagnosis not present

## 2016-08-11 NOTE — Therapy (Signed)
[facility] [address] [place], [place], [address] Phone: [phone]   Fax:  [phone]  Occupational Therapy Treatment  Patient Details  Name: Crystal Parsons MRN: [id number] Date of Birth: [birth year] Referring Provider: Dr. Delice Lesch  Encounter Date: 08/10/2016      OT End of Session - 08/10/16 0854    Visit Number 2   Number of Visits 16   Date for OT Re-Evaluation 09/27/16   Authorization Type Medicare - will need G code and PN every 10th visit   Authorization Time Period 60 days   Authorization - Visit Number 2   Authorization - Number of Visits 10   OT Start Time 0850   OT Stop Time 0930   OT Time Calculation (min) 40 min   Activity Tolerance Patient tolerated treatment well   Behavior During Therapy [facility] for tasks assessed/performed      Past Medical History:  Diagnosis Date  . Anxiety   . Bradycardia    At times with pulse in the 40s  . Brain cancer ([place])    Glioblastoma  . Diarrhea    With blating, improved with gluten-free diet  . Diastolic dysfunction   . Essential hypertension, benign    Always has HTN when at the doctor's office.  . 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 Gout   . Hyperlipidemia   . Lynch syndrome   . Mild aortic sclerosis ([place])   . Mitral valve problem    Mildly thickened mitral valve  . Mitral valve prolapse    Mild, anterior  . MR (mitral regurgitation)    ECHO 08/10/09 shows mild MR again and normal EF. No significant changes from prior ECHO.  Marland Kitchen Palpitations    Occasional, but are not significant. ECHO 08/20/08 - Normal EF (60%), mildly thickened mitral valve with mild anterior mitral valve prolapse, mild MR, mild aortic sclerosis, grade 1 diastolic dysfunction.  . Proteinuria    Likely secondary to Diabetes    Past Surgical History:  Procedure Laterality Date  . ABDOMINAL HYSTERECTOMY    . APPLICATION OF CRANIAL NAVIGATION Right 07/07/2016   Procedure: APPLICATION OF CRANIAL NAVIGATION;  Surgeon: Consuella Lose, MD;  Location: [facility];  Service: Neurosurgery;  Laterality: Right;  . BREAST LUMPECTOMY Left   . CRANIOTOMY Right 07/07/2016   Procedure: CRANIOTOMY TUMOR EXCISION w/BrainLab;  Surgeon: Consuella Lose, MD;  Location: [facility];  Service: Neurosurgery;  Laterality: Right;  . HEMORROIDECTOMY  2009  . TUBAL LIGATION      There were no vitals filed for this visit.      Subjective Assessment - 08/11/16 1244    Subjective  Denies pain   Pertinent History see epic pt with R frontal mass x2 s/p crani starts radiation and chemo today;    Patient Stated Goals I want to feel like myself again   Currently in Pain? No/denies                Completing small peg design with LUE mod/ max v.c./ difficulty for design, and for LUE fine motor coordination. Pt was easily distracted and demo decreased  Organization.              OT Education - 08/11/16 1247    Education provided Yes   Education Details putty and coordination HEP   Person(s) Educated Patient;Spouse   Methods Explanation;Demonstration;Verbal cues;Handout   Comprehension Verbalized understanding;Returned demonstration;Verbal cues required, mod v.c./ demonstration  OT Short Term Goals - 08/02/16 1222      OT SHORT TERM GOAL #1   Title Pt and husband will be mod I with HEP - 08/30/2106   Status New     OT SHORT TERM GOAL #2   Title Pt will decrease time on 9 hold peg with L hand by at least 4 seconds (baseline = 38.25) to assist with fine motor tasks   Status New     OT SHORT TERM GOAL #3   Title Pt will require supervision for simple, familiar hot meal prep   Status New     OT SHORT TERM GOAL #4   Title Pt will demonstate ability to follow 2-3 step instruction within context of functional, familar task.    Status New     OT SHORT TERM GOAL #5   Title Pt will identify at least 2 strategies for ST memory    Status New     OT  SHORT TERM GOAL #6   Title Pt will require no more than 2 vc's for selective attention during functional familar tasks in a busy environment.            OT Long Term Goals - 08/02/16 1227      OT LONG TERM GOAL #1   Title Pt and husband will be mod I with upgraded HEP prn - 09/27/2016   Status New     OT LONG TERM GOAL #2   Title Pt will decrease time on 9 hole peg test with L hand by at least 8 seconds (baseline=38.25) to assist with functional tasks.   Status New     OT LONG TERM GOAL #3   Title Pt will increase grip strength by 5 pounds to assist with functional use of L, non dominant hand (baseline= 30 pounds)   Status New     OT LONG TERM GOAL #4   Title Pt will be mod I with cooking 2 items simultaneously for altenating attention and safety with familiar hot meal prep.    Status New     OT LONG TERM GOAL #5   Title Pt will be mod I for laundry and simple home mgmt tasks.   Status New     OT LONG TERM GOAL #6   Title Pt will be able to set up med pill box with supervision only   Status New     OT LONG TERM GOAL #7   Title Pt will be mod I to attend to L during functional tasks.                Plan - 08/10/16 0924    Clinical Impression Statement Pt is progressing towards goals for left hand coordination and strength. Pt demonstrates impulsivity and decreased selective attention.   Rehab Potential Good   OT Frequency 2x / week   OT Duration 8 weeks   OT Treatment/Interventions Self-care/ADL training;DME and/or AE instruction;Neuromuscular education;Therapeutic exercise;Therapeutic activities;Patient/family education;Visual/perceptual remediation/compensation;Cognitive remediation/compensation      Patient will benefit from skilled therapeutic intervention in order to improve the following deficits and impairments:  Decreased activity tolerance, Decreased coordination, Decreased cognition, Decreased safety awareness, Decreased strength, Impaired UE functional  use, Impaired vision/preception  Visit Diagnosis: 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 Other constipation 08/09/2016  . 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 Polyposis syndrome, familial 07/20/2016  . H/O: hysterectomy 07/20/2016  . Leukocytosis   . Monocytosis   . Acute lower UTI   . GBM (glioblastoma multiforme) ([place])   . Glioblastoma multiforme of brain ([place]) 07/11/2016  . MR (mitral regurgitation)   . Brain tumor ([place])   . Apraxia   . Left-sided weakness   . Steroid-induced hyperglycemia   . Leucocytosis   . Benign essential HTN   . History of gout   . Dysuria   . Acute respiratory failure with hypoxia ([place])   . Brain mass   . Seizure ([place]) 06/30/2016  . Status epilepticus ([place]) 06/30/2016    Agness Sibrian 08/11/2016, 12:48 PM  [facility] [address] [place], [place], [address] Phone: [phone]   Fax:  [phone]  Name: Crystal Parsons MRN: [id number] Date of Birth: 1946/01/26

## 2016-08-12 ENCOUNTER — Encounter: Payer: Self-pay | Admitting: Physical Therapy

## 2016-08-12 ENCOUNTER — Ambulatory Visit: Payer: Medicare Other | Admitting: Occupational Therapy

## 2016-08-12 ENCOUNTER — Ambulatory Visit
Admission: RE | Admit: 2016-08-12 | Discharge: 2016-08-12 | Disposition: A | Discharge status: Home / Self Care | Payer: Medicare Other | Source: Ambulatory Visit | Attending: Radiation Oncology | Admitting: Radiation Oncology

## 2016-08-12 ENCOUNTER — Ambulatory Visit: Payer: Medicare Other | Admitting: Physical Therapy

## 2016-08-12 VITALS — BP 121/60 | HR 83 | Resp 18 | Wt 192.8 lb

## 2016-08-12 DIAGNOSIS — M6281 Muscle weakness (generalized): Secondary | ICD-10-CM

## 2016-08-12 DIAGNOSIS — R41844 Frontal lobe and executive function deficit: Secondary | ICD-10-CM

## 2016-08-12 DIAGNOSIS — R2689 Other abnormalities of gait and mobility: Secondary | ICD-10-CM | POA: Diagnosis not present

## 2016-08-12 DIAGNOSIS — R278 Other lack of coordination: Secondary | ICD-10-CM

## 2016-08-12 DIAGNOSIS — R41842 Visuospatial deficit: Secondary | ICD-10-CM

## 2016-08-12 DIAGNOSIS — G8194 Hemiplegia, unspecified affecting left nondominant side: Secondary | ICD-10-CM

## 2016-08-12 DIAGNOSIS — C719 Malignant neoplasm of brain, unspecified: Secondary | ICD-10-CM | POA: Diagnosis not present

## 2016-08-12 DIAGNOSIS — Z510 Encounter for antineoplastic radiation therapy: Secondary | ICD-10-CM | POA: Diagnosis not present

## 2016-08-12 DIAGNOSIS — C711 Malignant neoplasm of frontal lobe: Secondary | ICD-10-CM | POA: Diagnosis not present

## 2016-08-12 NOTE — Therapy (Signed)
[facility] [address] [place], [place], [address] Phone: [phone]   Fax:  [phone]  Occupational Therapy Treatment  Patient Details  Name: Crystal Parsons MRN: [id number] Date of Birth: [birth year] Referring Provider: Dr. Delice Lesch  Encounter Date: 08/12/2016      OT End of Session - 08/12/16 0952    Visit Number 3   Number of Visits 16   Authorization Type Medicare - will need G code and PN every 10th visit   Authorization Time Period 60 days   Authorization - Visit Number 3   Authorization - Number of Visits 10   OT Start Time 0851   OT Stop Time 0930   OT Time Calculation (min) 39 min      Past Medical History:  Diagnosis Date  . Anxiety   . Bradycardia    At times with pulse in the 40s  . Brain cancer ([place])    Glioblastoma  . Diarrhea    With blating, improved with gluten-free diet  . Diastolic dysfunction   . Essential hypertension, benign    Always has HTN when at the doctor's office.  . 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 Gout   . Hyperlipidemia   . Lynch syndrome   . Mild aortic sclerosis ([place])   . Mitral valve problem    Mildly thickened mitral valve  . Mitral valve prolapse    Mild, anterior  . MR (mitral regurgitation)    ECHO 08/10/09 shows mild MR again and normal EF. No significant changes from prior ECHO.  Marland Kitchen Palpitations    Occasional, but are not significant. ECHO 08/20/08 - Normal EF (60%), mildly thickened mitral valve with mild anterior mitral valve prolapse, mild MR, mild aortic sclerosis, grade 1 diastolic dysfunction.  . Proteinuria    Likely secondary to Diabetes    Past Surgical History:  Procedure Laterality Date  . ABDOMINAL HYSTERECTOMY    . APPLICATION OF CRANIAL NAVIGATION Right 07/07/2016   Procedure: APPLICATION OF CRANIAL NAVIGATION;  Surgeon: Consuella Lose, MD;  Location: [facility];  Service: Neurosurgery;  Laterality: Right;  . BREAST  LUMPECTOMY Left   . CRANIOTOMY Right 07/07/2016   Procedure: CRANIOTOMY TUMOR EXCISION w/BrainLab;  Surgeon: Consuella Lose, MD;  Location: [facility];  Service: Neurosurgery;  Laterality: Right;  . HEMORROIDECTOMY  2009  . TUBAL LIGATION      There were no vitals filed for this visit.      Subjective Assessment - 08/12/16 0926    Subjective  Denies pain   Pertinent History see epic pt with R frontal mass x2 s/p crani starts radiation and chemo today;    Patient Stated Goals I want to feel like myself again   Currently in Pain? No/denies        Treatment: Pt completed 2 basic puzzles for visual perceptual and coginitve skills using bilateral UE's, mod v.c. initially for orgranization- increased time required Seated closed chain shoulder flexion chest press and diagonals with medium ball,  min v.c. For LUE positioning 10 reps each Mid to high reaching to place large pegs in pegboard for increased coordination and LUE endurance Arm bike x 6 mins level 3 for conditioning.                       OT Short Term Goals - 08/11/16 1249      OT SHORT TERM GOAL #1   Title Pt and husband will be mod I  with HEP - 08/30/2106   Time 4   Period Weeks   Status New     OT SHORT TERM GOAL #2   Title Pt will decrease time on 9 hold peg with L hand by at least 4 seconds (baseline = 38.25) to assist with fine motor tasks   Time 4   Period Weeks   Status New     OT SHORT TERM GOAL #3   Title Pt will require supervision for simple, familiar hot meal prep   Time 4   Period Weeks   Status New     OT SHORT TERM GOAL #4   Title Pt will demonstate ability to follow 2-3 step instruction within context of functional, familar task.    Time 4   Period Weeks   Status New     OT SHORT TERM GOAL #5   Title Pt will identify at least 2 strategies for ST memory    Time 4   Period Weeks   Status New     OT SHORT TERM GOAL #6   Title Pt will require no more than 2 vc's for selective  attention during functional familar tasks in a busy environment.    Time 4   Period Weeks   Status New           OT Long Term Goals - 08/11/16 1250      OT LONG TERM GOAL #1   Title Pt and husband will be mod I with upgraded HEP prn - 09/27/2016   Time 8   Period Weeks   Status New     OT LONG TERM GOAL #2   Title Pt will decrease time on 9 hole peg test with L hand by at least 8 seconds (baseline=38.25) to assist with functional tasks.   Time 8   Period Weeks   Status New     OT LONG TERM GOAL #3   Title Pt will increase grip strength by 5 pounds to assist with functional use of L, non dominant hand (baseline= 30 pounds)   Time 8   Period Weeks   Status New     OT LONG TERM GOAL #4   Title Pt will be mod I with cooking 2 items simultaneously for altenating attention and safety with familiar hot meal prep.    Time 8   Period Weeks   Status New     OT LONG TERM GOAL #5   Title Pt will be mod I for laundry and simple home mgmt tasks.   Time 8   Period Weeks   Status New     OT LONG TERM GOAL #6   Title Pt will be able to set up med pill box with supervision only   Time 8   Period Weeks   Status New     OT LONG TERM GOAL #7   Title Pt will be mod I to attend to L during functional tasks.    Time 8   Period Weeks   Status New               Plan - 08/12/16 [id number]    Clinical Impression Statement Pt is progressing towards goals with improving strength and endurance.   Rehab Potential Good   OT Frequency 2x / week   OT Duration 8 weeks   OT Treatment/Interventions Self-care/ADL training;DME and/or AE instruction;Neuromuscular education;Therapeutic exercise;Therapeutic activities;Patient/family education;Visual/perceptual remediation/compensation;Cognitive remediation/compensation   Plan continue to address LUE functional use, strength and endurance  Consulted and Agree with Plan of Care Patient      Patient will benefit from skilled therapeutic  intervention in order to improve the following deficits and impairments:  Decreased activity tolerance, Decreased coordination, Decreased cognition, Decreased safety awareness, Decreased strength, Impaired UE functional use, Impaired vision/preception  Visit Diagnosis: 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 Other constipation 08/09/2016  . 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 Polyposis syndrome, familial 07/20/2016  . H/O: hysterectomy 07/20/2016  . Leukocytosis   . Monocytosis   . Acute lower UTI   . GBM (glioblastoma multiforme) ([place])   . Glioblastoma multiforme of brain ([facility]) 07/11/2016  . MR (mitral regurgitation)   . Brain tumor ([place])   . Apraxia   . Left-sided weakness   . Steroid-induced hyperglycemia   . Leucocytosis   . Benign essential HTN   . History of gout   . Dysuria   . Acute respiratory failure with hypoxia ([place])   . Brain mass   . Seizure ([place]) 06/30/2016  . Status epilepticus ([place]) 06/30/2016    Sherriann Szuch 08/12/2016, 9:53 AM  [facility] [address] [place], [place], [address] Phone: [phone]   Fax:  [phone]  Name: MYKELA KOFFORD MRN: [id number] Date of Birth: [birth year]

## 2016-08-12 NOTE — Progress Notes (Addendum)
Weight and vitals stable. Denies pain. Reports frontal headaches most mornings. Questions if she can take Tylenol to manage those. Reports constipation has resolved. Reports taking decadron 4 mg bid. No evidence of thrush noted. However, patient does reports a slight sore throat. Frontal scalp incision well healed without edema. Alopecia in this same are noted. Encouraged use of Biafine with treatment field as directed. Denies difficulty with word findings. Reports occasional confusion with times and days of the week. Reports normal long term memory. Reports fatigue. Biggest complaint is that treatment mask is tight.   BP 121/60 (BP Location: Right Arm, Patient Position: Sitting, Cuff Size: Normal)   Pulse 83   Resp 18   Wt 192 lb 12.8 oz (87.5 kg)   SpO2 100%   BMI 34.15 kg/m  Wt Readings from Last 3 Encounters:  08/12/16 192 lb 12.8 oz (87.5 kg)  08/09/16 190 lb 6.4 oz (86.4 kg)  08/05/16 193 lb 12.8 oz (87.9 kg)

## 2016-08-12 NOTE — Progress Notes (Signed)
  Radiation Oncology         [phone]   Name: Crystal Parsons MRN: [id number]   Date: 08/12/2016  DOB: 08-Nov-1945   Weekly Radiation Therapy Management    ICD-9-CM ICD-10-CM   1. Glioblastoma multiforme of brain (HCC) 191.9 C71.9     Current Dose: 18 Gy  Planned Dose:  60 Gy  Narrative The patient presents for routine under treatment assessment.  Weight and vitals stable. Denies pain. Reports frontal headaches most mornings. Quesions if she can take Tylenol to manage those. Reports occasionally she has trouble falling asleep at night. Reports constipation has resolved. Reports taking decadron 4 mg bid. However, patient does report a slight sore throat. Frontal scalp incision well healed without edema. Alopecia in this same area noted. Nursing encouraged use of Biafine within treatment field as directed. Denies difficulty with word fining. Reports occasional confusion with times and days of the week. Reports normal long term memory. Reports fatigue. She reports only one episode of stomach acid / reflux, she takes Pepcid. Biggest complaint is that treatment mask is tight and she has trouble swallowing while wearing the mask.   Set-up films were reviewed. The chart was checked.  Physical Findings  weight is 192 lb 12.8 oz (87.5 kg). Her blood pressure is 121/60 and her pulse is 83. Her respiration is 18 and oxygen saturation is 100%. . Weight essentially stable.  No significant changes. Alert and in no acute distress. No evidence of thrush noted.   Impression The patient is tolerating radiation.  Plan Continue treatment as planned.       Sheral Apley Tammi Klippel, M.D.  This document serves as a record of services personally performed by Tyler Pita, MD. It was created on his behalf by Arlyce Harman, a trained medical scribe. The creation of this record is based on the scribe's personal observations and the provider's statements to them. This document has been checked and approved by the  attending provider.

## 2016-08-12 NOTE — Therapy (Signed)
[facility] [address] [place], [place], [address] Phone: [phone]   Fax:  [phone]  Physical Therapy Treatment  Patient Details  Name: Crystal Parsons MRN: [id number] Date of Birth: Nov 03, 1945 Referring Provider: Dr. Posey Pronto  Encounter Date: 08/12/2016      PT End of Session - 08/12/16 0936    Visit Number 3   Number of Visits 9   Date for PT Re-Evaluation 09/01/16   Authorization Type Medicare primary and BCBS secondary. G-CODE AND PROGRESS NOTE EVERY 10TH VISIT.    PT Start Time 0930   PT Stop Time 1011   PT Time Calculation (min) 41 min   Equipment Utilized During Treatment Gait belt   Activity Tolerance Patient tolerated treatment well   Behavior During Therapy WFL for tasks assessed/performed      Past Medical History:  Diagnosis Date  . Anxiety   . Bradycardia    At times with pulse in the 40s  . Brain cancer ([facility])    Glioblastoma  . Diarrhea    With blating, improved with gluten-free diet  . Diastolic dysfunction   . Essential hypertension, benign    Always has HTN when at the doctor's office.  . Glioblastoma multiforme of brain ([facility]) 07/11/2016  . Gout   . Hyperlipidemia   . Lynch syndrome   . Mild aortic sclerosis ([facility])   . Mitral valve problem    Mildly thickened mitral valve  . Mitral valve prolapse    Mild, anterior  . MR (mitral regurgitation)    ECHO 08/10/09 shows mild MR again and normal EF. No significant changes from prior ECHO.  Marland Kitchen Palpitations    Occasional, but are not significant. ECHO 08/20/08 - Normal EF (60%), mildly thickened mitral valve with mild anterior mitral valve prolapse, mild MR, mild aortic sclerosis, grade 1 diastolic dysfunction.  . Proteinuria    Likely secondary to Diabetes    Past Surgical History:  Procedure Laterality Date  . ABDOMINAL HYSTERECTOMY    . APPLICATION OF CRANIAL NAVIGATION Right 07/07/2016   Procedure: APPLICATION OF CRANIAL  NAVIGATION;  Surgeon: Consuella Lose, MD;  Location: [facility];  Service: Neurosurgery;  Laterality: Right;  . BREAST LUMPECTOMY Left   . CRANIOTOMY Right 07/07/2016   Procedure: CRANIOTOMY TUMOR EXCISION w/BrainLab;  Surgeon: Consuella Lose, MD;  Location: [facility];  Service: Neurosurgery;  Laterality: Right;  . HEMORROIDECTOMY  2009  . TUBAL LIGATION      There were no vitals filed for this visit.      Subjective Assessment - 08/12/16 0935    Subjective No new complaints. No falls or pain to report. Has not done HEP. Reports going up stairs is difficult.   Patient is accompained by: Family member  spouse, John   Pertinent History HTN, Lynch syndrome, gout, urinary incontinence, seizures, palpitations    Currently in Pain? No/denies   Pain Score 0-No pain            [facility] Adult PT Treatment/Exercise - 08/12/16 0954      High Level Balance   High Level Balance Activities Tandem walking;Marching forwards;Marching backwards  tandem fwd/bwd, toe walk fwd/bwd, heel walk fwd/bwd   High Level Balance Comments next to counter top for safety: performed 3 laps each/each way with min guard to min assist, intermittent to light UE support on counter for balance. cues on ex form/technique provided.                 Knee/Hip Exercises:  Standing   Forward Step Up Both;1 set;10 reps;Step Height: 6";Hand Hold: 2  bil light fingertip support   Forward Step Up Limitations cues on ex form and technique           Balance Exercises - 08/12/16 0956      Balance Exercises: Standing   SLS with Vectors Solid surface;Other reps (comment);Limitations     Balance Exercises: Standing   SLS with Vectors Limitations 2 tall cones on the floor: alternating fwd toe taps, cross toe taps, fwd double toe taps, cross double toe taps x 10 each bil legs with min assist for balance, no UE support. cues on posture, ex form and weight shifting with activity.     Reviewed with pt performing HEP issued at previous  session. Provided cues on correct ex form/technique.         PT Short Term Goals - 08/02/16 1334      PT SHORT TERM GOAL #1   Title same as LTGs            PT Long Term Goals - 08/02/16 1334      PT LONG TERM GOAL #1   Title Pt will be IND in HEP to improve endurance, strength, and balance. TARGET DATE FOR ALL LTGS: 08/30/16   Status New     PT LONG TERM GOAL #2   Title Pt will improve FGA score to >/=25/30 to decr. falls risk.    Status New     PT LONG TERM GOAL #3   Title Pt will improve gait speed to >/=3.78ft/sec., no AD, to amb. safely in the community.    Status New     PT LONG TERM GOAL #4   Title Pt will amb. 1000' over even/uneven terrain, IND, in order to improve functional mobility.    Status New     PT LONG TERM GOAL #5   Title Perform 6MWT and write goal as indicated.    Status New               Plan - 08/12/16 0936    Clinical Impression Statement today's skilled session continued to focus on high level balance and LE strengthening with only fatigue reported. Reviewed pt's current HEP as well due to her not having performed it at home to date. Minimal cues needed on correct form and technique. Pt is making steady progress toward goals. Fatigues quickly and is challenged by single leg stance activities. Pt should benefit from continued PT to progress toward unmet goals.                                    Rehab Potential Good   Clinical Impairments Affecting Rehab Potential GBM   PT Frequency 2x / week   PT Duration 4 weeks   PT Treatment/Interventions ADLs/Self Care Home Management;Biofeedback;DME Instruction;Gait training;Stair training;Functional mobility training;Therapeutic activities;Therapeutic exercise;Balance training;Neuromuscular re-education;Manual techniques;Orthotic Fit/Training;Patient/family education;Vestibular   PT Next Visit Plan dynamic standing balance/stepping strategies, continue with LE strengthening   Consulted and Agree with  Plan of Care Patient      Patient will benefit from skilled therapeutic intervention in order to improve the following deficits and impairments:  Abnormal gait, Decreased endurance, Decreased knowledge of use of DME, Decreased balance, Decreased mobility, Decreased cognition, Impaired flexibility  Visit Diagnosis: 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 Other constipation 08/09/2016  . Malignant frontal lobe tumor ([facility])   . Polyposis syndrome, familial 07/20/2016  . H/O: hysterectomy 07/20/2016  . Leukocytosis   . Monocytosis   . Acute lower UTI   . GBM (glioblastoma multiforme) ([facility])   . Glioblastoma multiforme of brain ([facility]) 07/11/2016  . MR (mitral regurgitation)   . Brain tumor ([facility])   . Apraxia   . Left-sided weakness   . Steroid-induced hyperglycemia   . Leucocytosis   . Benign essential HTN   . History of gout   . Dysuria   . Acute respiratory failure with hypoxia ([facility])   . Brain mass   . Seizure ([facility]) 06/30/2016  . Status epilepticus ([facility]) 06/30/2016    Willow Ora, PTA, [facility] [address] [place], [place] [address] [phone] 08/12/16, 12:59 PM   Name: MARAE BLAN MRN: [id number] Date of Birth: 01/06/1946

## 2016-08-15 ENCOUNTER — Ambulatory Visit: Admission: RE | Admit: 2016-08-15 | Payer: Medicare Other | Source: Ambulatory Visit

## 2016-08-15 ENCOUNTER — Other Ambulatory Visit (HOSPITAL_BASED_OUTPATIENT_CLINIC_OR_DEPARTMENT_OTHER): Payer: Medicare Other

## 2016-08-15 ENCOUNTER — Ambulatory Visit: Payer: Medicare Other

## 2016-08-15 DIAGNOSIS — C711 Malignant neoplasm of frontal lobe: Secondary | ICD-10-CM

## 2016-08-15 DIAGNOSIS — C719 Malignant neoplasm of brain, unspecified: Secondary | ICD-10-CM

## 2016-08-15 LAB — CBC WITH DIFFERENTIAL/PLATELET
BASO%: 0.2 % (ref 0.0–2.0)
BASOS ABS: 0 10*3/uL (ref 0.0–0.1)
EOS ABS: 0 10*3/uL (ref 0.0–0.5)
EOS%: 0.1 % (ref 0.0–7.0)
HEMATOCRIT: 36.1 % (ref 34.8–46.6)
HGB: 11.9 g/dL (ref 11.6–15.9)
LYMPH#: 1 10*3/uL (ref 0.9–3.3)
LYMPH%: 7.6 % — AB (ref 14.0–49.7)
MCH: 30.2 pg (ref 25.1–34.0)
MCHC: 33 g/dL (ref 31.5–36.0)
MCV: 91.7 fL (ref 79.5–101.0)
MONO#: 0.8 10*3/uL (ref 0.1–0.9)
MONO%: 5.8 % (ref 0.0–14.0)
NEUT#: 11.4 10*3/uL — ABNORMAL HIGH (ref 1.5–6.5)
NEUT%: 86.3 % — AB (ref 38.4–76.8)
PLATELETS: 330 10*3/uL (ref 145–400)
RBC: 3.93 10*6/uL (ref 3.70–5.45)
RDW: 15.5 % — ABNORMAL HIGH (ref 11.2–14.5)
WBC: 13.2 10*3/uL — ABNORMAL HIGH (ref 3.9–10.3)

## 2016-08-15 LAB — COMPREHENSIVE METABOLIC PANEL
ALT: 76 U/L — ABNORMAL HIGH (ref 0–55)
ANION GAP: 12 meq/L — AB (ref 3–11)
AST: 23 U/L (ref 5–34)
Albumin: 3.4 g/dL — ABNORMAL LOW (ref 3.5–5.0)
Alkaline Phosphatase: 66 U/L (ref 40–150)
BILIRUBIN TOTAL: 0.41 mg/dL (ref 0.20–1.20)
BUN: 28.5 mg/dL — ABNORMAL HIGH (ref 7.0–26.0)
CALCIUM: 9.4 mg/dL (ref 8.4–10.4)
CHLORIDE: 107 meq/L (ref 98–109)
CO2: 20 mEq/L — ABNORMAL LOW (ref 22–29)
CREATININE: 0.8 mg/dL (ref 0.6–1.1)
EGFR: 76 mL/min/{1.73_m2} — AB (ref >90)
Glucose: 141 mg/dl — ABNORMAL HIGH (ref 70–140)
Potassium: 4.4 mEq/L (ref 3.5–5.1)
Sodium: 140 mEq/L (ref 136–145)
Total Protein: 6.5 g/dL (ref 6.4–8.3)

## 2016-08-15 LAB — TECHNOLOGIST REVIEW

## 2016-08-16 ENCOUNTER — Telehealth: Payer: Self-pay | Admitting: *Deleted

## 2016-08-16 ENCOUNTER — Ambulatory Visit (HOSPITAL_BASED_OUTPATIENT_CLINIC_OR_DEPARTMENT_OTHER): Payer: Medicare Other | Admitting: Hematology and Oncology

## 2016-08-16 ENCOUNTER — Ambulatory Visit
Admission: RE | Admit: 2016-08-16 | Discharge: 2016-08-16 | Disposition: A | Discharge status: Home / Self Care | Payer: Medicare Other | Source: Ambulatory Visit | Attending: Radiation Oncology | Admitting: Radiation Oncology

## 2016-08-16 ENCOUNTER — Encounter: Payer: Self-pay | Admitting: Hematology and Oncology

## 2016-08-16 VITALS — BP 133/59 | HR 92 | Temp 98.3°F | Resp 18 | Ht 63.0 in | Wt 195.5 lb

## 2016-08-16 DIAGNOSIS — C711 Malignant neoplasm of frontal lobe: Secondary | ICD-10-CM | POA: Diagnosis not present

## 2016-08-16 DIAGNOSIS — C719 Malignant neoplasm of brain, unspecified: Secondary | ICD-10-CM | POA: Diagnosis not present

## 2016-08-16 DIAGNOSIS — F410 Panic disorder [episodic paroxysmal anxiety] without agoraphobia: Secondary | ICD-10-CM

## 2016-08-16 DIAGNOSIS — R739 Hyperglycemia, unspecified: Secondary | ICD-10-CM | POA: Diagnosis not present

## 2016-08-16 DIAGNOSIS — T380X5A Adverse effect of glucocorticoids and synthetic analogues, initial encounter: Secondary | ICD-10-CM

## 2016-08-16 DIAGNOSIS — Z510 Encounter for antineoplastic radiation therapy: Secondary | ICD-10-CM | POA: Diagnosis not present

## 2016-08-16 MED ORDER — TEMOZOLOMIDE 140 MG PO CAPS: 75.0000 mg/m2/d | ORAL_CAPSULE | Freq: Every day | ORAL | 0 refills | Status: DC

## 2016-08-16 MED ORDER — LORAZEPAM 0.5 MG PO TABS: 0.5000 mg | ORAL_TABLET | Freq: Two times a day (BID) | ORAL | 0 refills | Status: AC | PRN

## 2016-08-16 MED ORDER — FAMOTIDINE 20 MG PO TABS: 20.0000 mg | ORAL_TABLET | Freq: Two times a day (BID) | ORAL | 11 refills | Status: AC

## 2016-08-16 MED ORDER — SULFAMETHOXAZOLE-TRIMETHOPRIM 800-160 MG PO TABS
1.0000 | ORAL_TABLET | ORAL | 11 refills | Status: AC
Start: 2016-08-17 — End: ?

## 2016-08-16 NOTE — Assessment & Plan Note (Signed)
She has mild anxiety attack today due to mild claustrophobia with her radiation mask I recommend a trial of lorazepam before radiation treatment I warned her about risk of sedation

## 2016-08-16 NOTE — Assessment & Plan Note (Signed)
She is noted to have mild hyperglycemia and cushingoid appearance related to long-term stent dexamethasone She just started radiation therapy recently I do not feel comfortable with further taper yet I will continue to assess weekly with plan to taper dexamethasone the near future The patient is advised dietary modification and she appears motivated to watch her diet for now

## 2016-08-16 NOTE — Telephone Encounter (Signed)
Pt's husband reports 23 Temodar pills left and pt's last Radiation Tx is on 2/13.   She will need 15 more pills to continue through last day of Radiation Treatment.   Refill of Temodar 140 mg daily E-scribed to [facility].

## 2016-08-16 NOTE — Assessment & Plan Note (Signed)
So far, she tolerated treatment well I would keep at current dose of dexamethasone with plan for future taper She will continue to take Zofran before treatment to prevent nausea She will continue antimicrobial prophylaxis with Bactrim She will continue antiseizure medications I reinforced the importance of physical therapy and rehabilitation I refilled multiple prescriptions for the patient today

## 2016-08-16 NOTE — Progress Notes (Signed)
[facility] OFFICE PROGRESS NOTE  Patient Care Team: Lavone Orn, MD as PCP - General (Internal Medicine)  SUMMARY OF ONCOLOGIC HISTORY:   Glioblastoma multiforme of brain [facility])   06/30/2016 - 07/10/2016 Hospital Admission    The patient was admitted to the hospital after presentation with seizure. She was subsequently found to have brain tumor and underwent primary resection. She was subsequently discharged to rehabilitation facility      07/01/2016 Imaging    CT head showed mass lesion within the right frontal lobe measuring up to 4.8 cm. 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 However, a cerebral abscess may have a similar appearance. MRI with and without contrast is recommended for further characterization. 2. No significant mass effect or midline shift.  No hydrocephalus.      07/01/2016 Imaging    Ct chest showed right parahilar density appears to be atelectasis or possible infiltrate but no mass is identified. There is also streaky bibasilar subsegmental atelectasis and very small pleural effusions. 2. No mediastinal or hilar mass or adenopathy. Scattered lymph nodes are noted.      07/01/2016 Imaging    MRI brain showed Motion degraded examination. 3 x 4.3 x 4.5 cm complex RIGHT frontal lobe mass with imaging characteristics of primary brain tumor. 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 Local edema versus nonenhancing infiltrative tumor results in 2 mm RIGHT to LEFT midline shift. No ventricular entrapment.      07/07/2016 Imaging    Interval RIGHT craniotomy for resection of dominant RIGHT frontal lobe tumor without convincing evidence of residual local disease. Intraventricular extension of blood products without hydrocephalus. 2 mm RIGHT to LEFT midline shift without ventricular entrapment. Residual subcentimeter RIGHT frontal lobe satellite nodule consistent  with tumor.      07/07/2016 Surgery    Dr. Kathyrn Sheriff performed stereotactic right frontal craniotomy for resection of tumor       07/07/2016 Pathology Results    Accession: [id number] Brain, for tumor resection, Right Frontal Lobe - GLIOBLASTOMA MULTIFORME WHO GRADE IV/IV. - SEE ONCOLOGY TABLE BELOW. Microscopic Comment ONCOLOGY TABLE - BRAIN AND SPINAL CORD 1. Procedure: Resection 2. Tumor site, including laterality: Right frontal lobe 3. Maximum tumor size (cm): At least 4.8 cm (gross measurement) 4. Histologic type: Glioblastoma multiforme 5. Grade: WHO grade IV/IV 6. Margins (if applicable): Can not be assessed 7. Ancillary studies: Per protocol, a block will be sent for MGMT and IDH1/2 testing and MGMT was positive      08/02/2016 -  Radiation Therapy    She started concurrent chemo/RT      08/02/2016 -  Chemotherapy    She started concurrent chemo/RT       INTERVAL HISTORY: Please see below for problem oriented charting. She returns today with her husband She denies recent headache, seizure or new weakness She complained of recent mild and anxiety attack during radiation treatment and she felt the mass around her face was too tight Otherwise, she had no other new complaints  REVIEW OF SYSTEMS:   Constitutional: Denies fevers, chills or abnormal weight loss Eyes: Denies blurriness of vision Ears, nose, mouth, throat, and face: Denies mucositis or sore throat Respiratory: Denies cough, dyspnea or wheezes Cardiovascular: Denies palpitation, chest discomfort or lower extremity swelling Gastrointestinal:  Denies nausea, heartburn or change in bowel habits Skin: Denies abnormal skin rashes Lymphatics: Denies new lymphadenopathy or easy bruising Neurological:Denies numbness, tingling  or new weaknesses Behavioral/Psych: Mood is stable, no new changes  All other systems were reviewed with the patient and are negative.  I have reviewed the past medical history, past surgical  history, social history and family history with the patient and they are unchanged from previous note.  ALLERGIES:  is allergic to accupril [quinapril hcl]; latex; vitamin e; naproxen; and zoloft [sertraline hcl].  MEDICATIONS:  Current Outpatient Prescriptions  Medication Sig Dispense Refill  . Cholecalciferol (VITAMIN D) 2000 UNITS tablet Take 2,000 Units by mouth daily.    Marland Kitchen dexamethasone (DECADRON) 4 MG tablet Take 1 tablet (4 mg total) by mouth every 8 (eight) hours. (Patient taking differently: Take 4 mg by mouth 2 (two) times daily. ) 90 tablet 0  . famotidine (PEPCID) 20 MG tablet Take 1 tablet (20 mg total) by mouth 2 (two) times daily. 60 tablet 11  . levETIRAcetam (KEPPRA) 1000 MG tablet Take 1 tablet (1,000 mg total) by mouth 2 (two) times daily. 60 tablet 0  . losartan-hydrochlorothiazide (HYZAAR) 50-12.5 MG tablet Take 1 tablet by mouth daily. 30 tablet 0  . Multiple Vitamin (MULTIVITAMIN) tablet Take 1 tablet by mouth daily.    . ondansetron (ZOFRAN) 8 MG tablet Take 1 tablet (8 mg total) by mouth every 8 (eight) hours as needed for nausea. 60 tablet 3  . senna-docusate (SENOKOT-S) 8.6-50 MG tablet Take 2 tablets by mouth at bedtime. 100 tablet 0  . [START ON 08/17/2016] sulfamethoxazole-trimethoprim (BACTRIM DS,SEPTRA DS) 800-160 MG tablet Take 1 tablet by mouth 3 (three) times a week. 60 tablet 11  . LORazepam (ATIVAN) 0.5 MG tablet Take 1 tablet (0.5 mg total) by mouth 2 (two) times daily as needed for anxiety. 60 tablet 0  . temozolomide (TEMODAR) 140 MG capsule Take 1 capsule (140 mg total) by mouth daily. May take on an empty stomach or at bedtime to decrease nausea & vomiting. 15 capsule 0   No current facility-administered medications for this visit.     PHYSICAL EXAMINATION: ECOG PERFORMANCE STATUS: 1 - Symptomatic but completely ambulatory  Vitals:   08/16/16 1410  BP: (!) 133/59  Pulse: 92  Resp: 18  Temp: 98.3 F (36.8 C)   Filed Weights   08/16/16 1410   Weight: 195 lb 8 oz (88.7 kg)    GENERAL:alert, no distress and comfortable. She looks mildly cushingoid SKIN: skin color, texture, turgor are normal, no rashes or significant lesions EYES: normal, Conjunctiva are pink and non-injected, sclera clear OROPHARYNX:no exudate, no erythema and lips, buccal mucosa, and tongue normal  NECK: supple, thyroid normal size, non-tender, without nodularity LYMPH:  no palpable lymphadenopathy in the cervical, axillary or inguinal LUNGS: clear to auscultation and percussion with normal breathing effort HEART: regular rate & rhythm and no murmurs and no lower extremity edema ABDOMEN:abdomen soft, non-tender and normal bowel sounds Musculoskeletal:no cyanosis of digits and no clubbing  NEURO: alert & oriented x 3 with fluent speech, no focal motor/sensory deficits  LABORATORY DATA:  I have reviewed the data as listed    Component Value Date/Time   NA 140 08/15/2016 1307   K 4.4 08/15/2016 1307   CL 103 07/18/2016 0439   CO2 20 (L) 08/15/2016 1307   GLUCOSE 141 (H) 08/15/2016 1307   BUN 28.5 (H) 08/15/2016 1307   CREATININE 0.8 08/15/2016 1307   CALCIUM 9.4 08/15/2016 1307   PROT 6.5 08/15/2016 1307   ALBUMIN 3.4 (L) 08/15/2016 1307   AST 23 08/15/2016 1307   ALT 76 (H) 08/15/2016  9357   SVXBLTJ 03 08/15/2016 1307   BILITOT 0.41 08/15/2016 1307   GFRNONAA >60 07/18/2016 0439   GFRAA >60 07/18/2016 0439    No results found for: SPEP, UPEP  Lab Results  Component Value Date   WBC 13.2 (H) 08/15/2016   NEUTROABS 11.4 (H) 08/15/2016   HGB 11.9 08/15/2016   HCT 36.1 08/15/2016   MCV 91.7 08/15/2016   PLT 330 08/15/2016      Chemistry      Component Value Date/Time   NA 140 08/15/2016 1307   K 4.4 08/15/2016 1307   CL 103 07/18/2016 0439   CO2 20 (L) 08/15/2016 1307   BUN 28.5 (H) 08/15/2016 1307   CREATININE 0.8 08/15/2016 1307      Component Value Date/Time   CALCIUM 9.4 08/15/2016 1307   ALKPHOS 66 08/15/2016 1307   AST 23  08/15/2016 1307   ALT 76 (H) 08/15/2016 1307   BILITOT 0.41 08/15/2016 1307      ASSESSMENT & PLAN:  Glioblastoma multiforme of brain ([facility]) So far, she tolerated treatment well I would keep at current dose of dexamethasone with plan for future taper She will continue to take Zofran before treatment to prevent nausea She will continue antimicrobial prophylaxis with Bactrim She will continue antiseizure medications I reinforced the importance of physical therapy and rehabilitation I refilled multiple prescriptions for the patient today  Anxiety attack She has mild anxiety attack today due to mild claustrophobia with her radiation mask I recommend a trial of lorazepam before radiation treatment I warned her about risk of sedation  Steroid-induced hyperglycemia She is noted to have mild hyperglycemia and cushingoid appearance related to long-term stent dexamethasone She just started radiation therapy recently I do not feel comfortable with further taper yet I will continue to assess weekly with plan to taper dexamethasone the near future The patient is advised dietary modification and she appears motivated to watch her diet for now   No orders of the defined types were placed in this encounter.  All questions were answered. The patient knows to call the clinic with any problems, questions or concerns. No barriers to learning was detected. I spent 15 minutes counseling the patient face to face. The total time spent in the appointment was 20 minutes and more than 50% was on counseling and review of test results     Heath Lark, MD 08/16/2016 5:35 PM

## 2016-08-17 ENCOUNTER — Ambulatory Visit: Payer: Medicare Other | Admitting: Physical Therapy

## 2016-08-17 ENCOUNTER — Ambulatory Visit: Payer: Medicare Other

## 2016-08-17 ENCOUNTER — Ambulatory Visit: Payer: Medicare Other | Admitting: Occupational Therapy

## 2016-08-18 ENCOUNTER — Ambulatory Visit: Payer: Medicare Other

## 2016-08-19 ENCOUNTER — Other Ambulatory Visit: Payer: Self-pay

## 2016-08-19 ENCOUNTER — Ambulatory Visit: Admission: RE | Admit: 2016-08-19 | Payer: Medicare Other | Source: Ambulatory Visit | Admitting: Radiation Oncology

## 2016-08-19 ENCOUNTER — Encounter: Payer: Medicare Other | Admitting: Occupational Therapy

## 2016-08-19 ENCOUNTER — Ambulatory Visit
Admission: RE | Admit: 2016-08-19 | Discharge: 2016-08-19 | Disposition: A | Discharge status: Home / Self Care | Payer: Medicare Other | Source: Ambulatory Visit | Attending: Radiation Oncology | Admitting: Radiation Oncology

## 2016-08-19 ENCOUNTER — Ambulatory Visit: Payer: Medicare Other

## 2016-08-19 ENCOUNTER — Ambulatory Visit: Payer: Medicare Other | Admitting: Physical Therapy

## 2016-08-19 VITALS — BP 138/77 | HR 95 | Resp 18 | Wt 193.4 lb

## 2016-08-19 DIAGNOSIS — C711 Malignant neoplasm of frontal lobe: Secondary | ICD-10-CM | POA: Diagnosis not present

## 2016-08-19 DIAGNOSIS — H35371 Puckering of macula, right eye: Secondary | ICD-10-CM | POA: Diagnosis not present

## 2016-08-19 DIAGNOSIS — C719 Malignant neoplasm of brain, unspecified: Secondary | ICD-10-CM

## 2016-08-19 DIAGNOSIS — H25013 Cortical age-related cataract, bilateral: Secondary | ICD-10-CM | POA: Diagnosis not present

## 2016-08-19 DIAGNOSIS — Z510 Encounter for antineoplastic radiation therapy: Secondary | ICD-10-CM | POA: Diagnosis not present

## 2016-08-19 MED ORDER — BIAFINE EX EMUL
CUTANEOUS | Status: DC | PRN
  Administered 2016-08-19: 15:00:00 via TOPICAL

## 2016-08-19 MED ORDER — LEVETIRACETAM 1000 MG PO TABS: 1000.0000 mg | ORAL_TABLET | Freq: Two times a day (BID) | ORAL | 0 refills | Status: DC

## 2016-08-19 MED FILL — levETIRAcetam 1000 MG TABS: 1000 | 30 days supply | Qty: 60 | Fill #0

## 2016-08-19 NOTE — Progress Notes (Signed)
  Radiation Oncology         [phone]   Name: Crystal Parsons MRN: [id number]   Date: 08/19/2016  DOB: 1946-05-05   Weekly Radiation Therapy Management    ICD-9-CM ICD-10-CM   1. GBM (glioblastoma multiforme) (HCC) 191.9 C71.9 topical emolient (BIAFINE) emulsion  2. Glioblastoma multiforme of brain (HCC) 191.9 C71.9     Current Dose: 22 Gy  Planned Dose:  60 Gy  Narrative The patient presents for routine under treatment assessment.  Weight and vitals stable. Denies pain. Reports frontal headaches continue in the morning. Reports taking Tylenol for relief. She was seen by her opthalmologist earlier today and may have to have cataracts removed by end of year. Continues decadron 4 mg bid. No evidence of thrush noted by the nurse. Reports taking two tablets of Senokot for constipation. Reports on average she has one bowel movement per day. Alopecia noted by the nurse. Hyperpigmentation at scalp incision. Reports scalp itches. The nurse encouraged the patient to use Biafine more often and switch shampoo to Head and Shoulders. Reports she felt more comfortable in her treatment mask today. Reports taking Ativan just prior to treatment today.   Set-up films were reviewed. The chart was checked.  Physical Findings  weight is 193 lb 6.4 oz (87.7 kg). Her blood pressure is 138/77 and her pulse is 95. Her respiration is 18 and oxygen saturation is 97%. . Weight essentially stable.  No significant changes. Alert and in no acute distress. Minimal erythema and no hair loss in the treatment area.  Impression The patient is tolerating radiation.  Plan Continue treatment as planned.       Sheral Apley Tammi Klippel, M.D.  This document serves as a record of services personally performed by Tyler Pita, MD. It was created on his behalf by Darcus Austin, a trained medical scribe. The creation of this record is based on the scribe's personal observations and the provider's statements to them. This document has  been checked and approved by the attending provider.

## 2016-08-19 NOTE — Progress Notes (Signed)
Weight and vitals stable. Denies pain. Reports frontal headaches continue in the morning. Reports taking Tylenol for relief. See by eye doctor earlier today may have to have cataracts removed by end of year. Continues decadron 4 mg bid. No evidence of thrush noted. Reports taking Senokot two tablets each night thus, constipation has improved. Reports on average she has one bowel movement per day. Alopecia noted. Hyperpigmentation at scalp incision. Reports scalp itches. Encouraged patient to use Biafine more often and switch shampoo to head and shoulders. Reports she felt more comfortable in her treatment mask today. Reports taking Ativan just prior to treatment today.   BP 138/77 (BP Location: Left Arm, Patient Position: Sitting, Cuff Size: Normal)   Pulse 95   Resp 18   Wt 193 lb 6.4 oz (87.7 kg)   SpO2 97%   BMI 34.26 kg/m  Wt Readings from Last 3 Encounters:  08/19/16 193 lb 6.4 oz (87.7 kg)  08/16/16 195 lb 8 oz (88.7 kg)  08/12/16 192 lb 12.8 oz (87.5 kg)

## 2016-08-22 ENCOUNTER — Inpatient Hospital Stay: Admission: RE | Admit: 2016-08-22 | Payer: Medicare Other | Source: Ambulatory Visit | Admitting: Radiation Oncology

## 2016-08-22 ENCOUNTER — Ambulatory Visit
Admission: RE | Admit: 2016-08-22 | Discharge: 2016-08-22 | Disposition: A | Discharge status: Home / Self Care | Payer: Medicare Other | Source: Ambulatory Visit | Attending: Radiation Oncology | Admitting: Radiation Oncology

## 2016-08-22 ENCOUNTER — Ambulatory Visit: Payer: Medicare Other

## 2016-08-22 ENCOUNTER — Other Ambulatory Visit: Payer: Self-pay | Admitting: Physical Medicine & Rehabilitation

## 2016-08-22 ENCOUNTER — Other Ambulatory Visit (HOSPITAL_BASED_OUTPATIENT_CLINIC_OR_DEPARTMENT_OTHER): Payer: Medicare Other

## 2016-08-22 DIAGNOSIS — Z510 Encounter for antineoplastic radiation therapy: Secondary | ICD-10-CM | POA: Diagnosis not present

## 2016-08-22 DIAGNOSIS — C719 Malignant neoplasm of brain, unspecified: Secondary | ICD-10-CM | POA: Diagnosis not present

## 2016-08-22 DIAGNOSIS — C711 Malignant neoplasm of frontal lobe: Secondary | ICD-10-CM | POA: Diagnosis present

## 2016-08-22 LAB — CBC WITH DIFFERENTIAL/PLATELET
BASO%: 0.1 % (ref 0.0–2.0)
BASOS ABS: 0 10*3/uL (ref 0.0–0.1)
EOS ABS: 0 10*3/uL (ref 0.0–0.5)
EOS%: 0 % (ref 0.0–7.0)
HCT: 35.6 % (ref 34.8–46.6)
HEMOGLOBIN: 11.6 g/dL (ref 11.6–15.9)
LYMPH%: 6 % — AB (ref 14.0–49.7)
MCH: 30.1 pg (ref 25.1–34.0)
MCHC: 32.6 g/dL (ref 31.5–36.0)
MCV: 92.2 fL (ref 79.5–101.0)
MONO#: 0.8 10*3/uL (ref 0.1–0.9)
MONO%: 5.3 % (ref 0.0–14.0)
NEUT%: 88.6 % — ABNORMAL HIGH (ref 38.4–76.8)
NEUTROS ABS: 13.4 10*3/uL — AB (ref 1.5–6.5)
PLATELETS: 254 10*3/uL (ref 145–400)
RBC: 3.86 10*6/uL (ref 3.70–5.45)
RDW: 15.3 % — AB (ref 11.2–14.5)
WBC: 15.2 10*3/uL — AB (ref 3.9–10.3)
lymph#: 0.9 10*3/uL (ref 0.9–3.3)

## 2016-08-22 LAB — COMPREHENSIVE METABOLIC PANEL
ALBUMIN: 3.4 g/dL — AB (ref 3.5–5.0)
ALK PHOS: 59 U/L (ref 40–150)
ALT: 57 U/L — ABNORMAL HIGH (ref 0–55)
ANION GAP: 11 meq/L (ref 3–11)
AST: 15 U/L (ref 5–34)
BILIRUBIN TOTAL: 0.59 mg/dL (ref 0.20–1.20)
BUN: 30.2 mg/dL — ABNORMAL HIGH (ref 7.0–26.0)
CO2: 21 mEq/L — ABNORMAL LOW (ref 22–29)
Calcium: 9.5 mg/dL (ref 8.4–10.4)
Chloride: 107 mEq/L (ref 98–109)
Creatinine: 0.8 mg/dL (ref 0.6–1.1)
EGFR: 76 mL/min/{1.73_m2} — AB (ref >90)
Glucose: 161 mg/dl — ABNORMAL HIGH (ref 70–140)
POTASSIUM: 3.9 meq/L (ref 3.5–5.1)
Sodium: 140 mEq/L (ref 136–145)
TOTAL PROTEIN: 6.5 g/dL (ref 6.4–8.3)

## 2016-08-22 LAB — TECHNOLOGIST REVIEW

## 2016-08-23 ENCOUNTER — Encounter: Payer: Self-pay | Admitting: Hematology and Oncology

## 2016-08-23 ENCOUNTER — Ambulatory Visit (HOSPITAL_BASED_OUTPATIENT_CLINIC_OR_DEPARTMENT_OTHER): Payer: Medicare Other | Admitting: Hematology and Oncology

## 2016-08-23 ENCOUNTER — Telehealth: Payer: Self-pay | Admitting: *Deleted

## 2016-08-23 ENCOUNTER — Ambulatory Visit
Admission: RE | Admit: 2016-08-23 | Discharge: 2016-08-23 | Disposition: A | Discharge status: Home / Self Care | Payer: Medicare Other | Source: Ambulatory Visit | Attending: Radiation Oncology | Admitting: Radiation Oncology

## 2016-08-23 DIAGNOSIS — F410 Panic disorder [episodic paroxysmal anxiety] without agoraphobia: Secondary | ICD-10-CM | POA: Diagnosis not present

## 2016-08-23 DIAGNOSIS — C711 Malignant neoplasm of frontal lobe: Secondary | ICD-10-CM | POA: Diagnosis not present

## 2016-08-23 DIAGNOSIS — T380X5A Adverse effect of glucocorticoids and synthetic analogues, initial encounter: Secondary | ICD-10-CM

## 2016-08-23 DIAGNOSIS — C719 Malignant neoplasm of brain, unspecified: Secondary | ICD-10-CM | POA: Diagnosis not present

## 2016-08-23 DIAGNOSIS — R748 Abnormal levels of other serum enzymes: Secondary | ICD-10-CM

## 2016-08-23 DIAGNOSIS — Z510 Encounter for antineoplastic radiation therapy: Secondary | ICD-10-CM | POA: Diagnosis not present

## 2016-08-23 DIAGNOSIS — R739 Hyperglycemia, unspecified: Secondary | ICD-10-CM | POA: Diagnosis not present

## 2016-08-23 MED ORDER — TEMOZOLOMIDE 140 MG PO CAPS: 75.0000 mg/m2/d | ORAL_CAPSULE | Freq: Every day | ORAL | 0 refills | Status: DC

## 2016-08-23 NOTE — Progress Notes (Signed)
[place] OFFICE PROGRESS NOTE  Patient Care Team: Lavone Orn, MD as PCP - General (Internal Medicine)  SUMMARY OF ONCOLOGIC HISTORY:   Glioblastoma multiforme of brain [facility])   06/30/2016 - 07/10/2016 Hospital Admission    The patient was admitted to the hospital after presentation with seizure. She was subsequently found to have brain tumor and underwent primary resection. She was subsequently discharged to rehabilitation facility      07/01/2016 Imaging    CT head showed mass lesion within the right frontal lobe measuring up to 4.8 cm. 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 However, a cerebral abscess may have a similar appearance. MRI with and without contrast is recommended for further characterization. 2. No significant mass effect or midline shift.  No hydrocephalus.      07/01/2016 Imaging    Ct chest showed right parahilar density appears to be atelectasis or possible infiltrate but no mass is identified. There is also streaky bibasilar subsegmental atelectasis and very small pleural effusions. 2. No mediastinal or hilar mass or adenopathy. Scattered lymph nodes are noted.      07/01/2016 Imaging    MRI brain showed Motion degraded examination. 3 x 4.3 x 4.5 cm complex RIGHT frontal lobe mass with imaging characteristics of primary brain tumor. 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 Local edema versus nonenhancing infiltrative tumor results in 2 mm RIGHT to LEFT midline shift. No ventricular entrapment.      07/07/2016 Imaging    Interval RIGHT craniotomy for resection of dominant RIGHT frontal lobe tumor without convincing evidence of residual local disease. Intraventricular extension of blood products without hydrocephalus. 2 mm RIGHT to LEFT midline shift without ventricular entrapment. Residual subcentimeter RIGHT frontal lobe satellite nodule consistent  with tumor.      07/07/2016 Surgery    Dr. Kathyrn Sheriff performed stereotactic right frontal craniotomy for resection of tumor       07/07/2016 Pathology Results    Accession: [id number] Brain, for tumor resection, Right Frontal Lobe - GLIOBLASTOMA MULTIFORME WHO GRADE IV/IV. - SEE ONCOLOGY TABLE BELOW. Microscopic Comment ONCOLOGY TABLE - BRAIN AND SPINAL CORD 1. Procedure: Resection 2. Tumor site, including laterality: Right frontal lobe 3. Maximum tumor size (cm): At least 4.8 cm (gross measurement) 4. Histologic type: Glioblastoma multiforme 5. Grade: WHO grade IV/IV 6. Margins (if applicable): Can not be assessed 7. Ancillary studies: Per protocol, a block will be sent for MGMT and IDH1/2 testing and MGMT was positive      08/02/2016 -  Radiation Therapy    She started concurrent chemo/RT      08/02/2016 -  Chemotherapy    She started concurrent chemo/RT       INTERVAL HISTORY: Please see below for problem oriented charting. She returns for follow-up She missed several treatment last week due to inclement weather She was not able to do rehabilitation either She denies focal neurological deficits of seizure With lorazepam before radiation, that has helped but the mask no longer fit her due to excessive weight gain She complained of lower extremity weakness since missing her therapy She denies recent infection  REVIEW OF SYSTEMS:   Constitutional: Denies fevers, chills or abnormal weight loss Eyes: Denies blurriness of vision Ears, nose, mouth, throat, and face: Denies mucositis or sore throat Respiratory: Denies cough, dyspnea or wheezes Cardiovascular: Denies palpitation, chest discomfort or lower extremity swelling Gastrointestinal:  Denies nausea, heartburn or  change in bowel habits Skin: Denies abnormal skin rashes Lymphatics: Denies new lymphadenopathy or easy bruising Neurological:Denies numbness, tingling  Behavioral/Psych: Mood is stable, no new changes  All  other systems were reviewed with the patient and are negative.  I have reviewed the past medical history, past surgical history, social history and family history with the patient and they are unchanged from previous note.  ALLERGIES:  is allergic to accupril [quinapril hcl]; latex; vitamin e; naproxen; and zoloft [sertraline hcl].  MEDICATIONS:  Current Outpatient Prescriptions  Medication Sig Dispense Refill  . Cholecalciferol (VITAMIN D) 2000 UNITS tablet Take 2,000 Units by mouth daily.    Marland Kitchen dexamethasone (DECADRON) 4 MG tablet Take 4 mg by mouth 2 (two) times daily. Once in am and again afternoon before 3 pm.    . famotidine (PEPCID) 20 MG tablet Take 1 tablet (20 mg total) by mouth 2 (two) times daily. 60 tablet 11  . levETIRAcetam (KEPPRA) 1000 MG tablet Take 1 tablet (1,000 mg total) by mouth 2 (two) times daily. 60 tablet 0  . LORazepam (ATIVAN) 0.5 MG tablet Take 1 tablet (0.5 mg total) by mouth 2 (two) times daily as needed for anxiety. 60 tablet 0  . losartan-hydrochlorothiazide (HYZAAR) 50-12.5 MG tablet Take 1 tablet by mouth daily. 30 tablet 0  . Multiple Vitamin (MULTIVITAMIN) tablet Take 1 tablet by mouth daily.    . ondansetron (ZOFRAN) 8 MG tablet Take 1 tablet (8 mg total) by mouth every 8 (eight) hours as needed for nausea. 60 tablet 3  . senna-docusate (SENOKOT-S) 8.6-50 MG tablet Take 2 tablets by mouth at bedtime. 100 tablet 0  . sulfamethoxazole-trimethoprim (BACTRIM DS,SEPTRA DS) 800-160 MG tablet Take 1 tablet by mouth 3 (three) times a week. 60 tablet 11  . temozolomide (TEMODAR) 140 MG capsule Take 1 capsule (140 mg total) by mouth daily. May take on an empty stomach or at bedtime to decrease nausea & vomiting. 17 capsule 0   No current facility-administered medications for this visit.     PHYSICAL EXAMINATION: ECOG PERFORMANCE STATUS: 1 - Symptomatic but completely ambulatory  Vitals:   08/23/16 1420  BP: 110/72  Pulse: 87  Resp: 18  Temp: 98.2 F (36.8  C)   Filed Weights   08/23/16 1420  Weight: 195 lb 9.6 oz (88.7 kg)    GENERAL:alert, no distress and comfortable. She looks cushingoid and appears to have gained weight since the last time I saw her SKIN: skin color, texture, turgor are normal, no rashes or significant lesions EYES: normal, Conjunctiva are pink and non-injected, sclera clear OROPHARYNX:no exudate, no erythema and lips, buccal mucosa, and tongue normal  NECK: supple, thyroid normal size, non-tender, without nodularity LYMPH:  no palpable lymphadenopathy in the cervical, axillary or inguinal LUNGS: clear to auscultation and percussion with normal breathing effort HEART: regular rate & rhythm and no murmurs and no lower extremity edema ABDOMEN:abdomen soft, non-tender and normal bowel sounds Musculoskeletal:no cyanosis of digits and no clubbing  NEURO: alert & oriented x 3 with fluent speech, no focal motor/sensory deficits  LABORATORY DATA:  I have reviewed the data as listed    Component Value Date/Time   NA 140 08/22/2016 1304   K 3.9 08/22/2016 1304   CL 103 07/18/2016 0439   CO2 21 (L) 08/22/2016 1304   GLUCOSE 161 (H) 08/22/2016 1304   BUN 30.2 (H) 08/22/2016 1304   CREATININE 0.8 08/22/2016 1304   CALCIUM 9.5 08/22/2016 1304   PROT 6.5 08/22/2016 1304  ALBUMIN 3.4 (L) 08/22/2016 1304   AST 15 08/22/2016 1304   ALT 57 (H) 08/22/2016 1304   ALKPHOS 59 08/22/2016 1304   BILITOT 0.59 08/22/2016 1304   GFRNONAA >60 07/18/2016 0439   GFRAA >60 07/18/2016 0439    No results found for: SPEP, UPEP  Lab Results  Component Value Date   WBC 15.2 (H) 08/22/2016   NEUTROABS 13.4 (H) 08/22/2016   HGB 11.6 08/22/2016   HCT 35.6 08/22/2016   MCV 92.2 08/22/2016   PLT 254 08/22/2016      Chemistry      Component Value Date/Time   NA 140 08/22/2016 1304   K 3.9 08/22/2016 1304   CL 103 07/18/2016 0439   CO2 21 (L) 08/22/2016 1304   BUN 30.2 (H) 08/22/2016 1304   CREATININE 0.8 08/22/2016 1304       Component Value Date/Time   CALCIUM 9.5 08/22/2016 1304   ALKPHOS 59 08/22/2016 1304   AST 15 08/22/2016 1304   ALT 57 (H) 08/22/2016 1304   BILITOT 0.59 08/22/2016 1304      ASSESSMENT & PLAN:  Glioblastoma multiforme of brain ([place]) So far, she tolerated treatment well but has gained a tremendous amount of weight to the point at the radiation mask is no longer fitting her well At the end of the week, recommend she reduce afternoon dexamethasone to 2 mg but keep at 4 mg in the morning She will continue to take Zofran before treatment to prevent nausea She will continue antimicrobial prophylaxis with Bactrim She will continue antiseizure medications I reinforced the importance of physical therapy and rehabilitation  Steroid-induced hyperglycemia She is noted to have progressive hyperglycemia, weight gain and cushingoid appearance related to long-term side-effects of dexamethasone The patient is advised dietary modification  I recommend reducing afternoon dose of dexamethasone to 2 mg starting this weekend  Anxiety attack She has mild anxiety attack today due to mild claustrophobia with her radiation mask I recommend a trial of lorazepam before radiation treatment This has helped her quite a bit We will continue the same  Elevated liver enzymes She has mildly elevated liver enzymes, likely due to medication side effects and possible fatty liver congestion from the excessive weight gain This is not significant I recommend we continue same treatment with Temodar without dosage adjustment   No orders of the defined types were placed in this encounter.  All questions were answered. The patient knows to call the clinic with any problems, questions or concerns. No barriers to learning was detected. I spent 15 minutes counseling the patient face to face. The total time spent in the appointment was 20 minutes and more than 50% was on counseling and review of test results     Heath Lark, MD 08/23/2016 3:02 PM

## 2016-08-23 NOTE — Assessment & Plan Note (Signed)
She has mild anxiety attack today due to mild claustrophobia with her radiation mask I recommend a trial of lorazepam before radiation treatment This has helped her quite a bit We will continue the same

## 2016-08-23 NOTE — Assessment & Plan Note (Signed)
She has mildly elevated liver enzymes, likely due to medication side effects and possible fatty liver congestion from the excessive weight gain This is not significant I recommend we continue same treatment with Temodar without dosage adjustment

## 2016-08-23 NOTE — Assessment & Plan Note (Signed)
She is noted to have progressive hyperglycemia, weight gain and cushingoid appearance related to long-term side-effects of dexamethasone The patient is advised dietary modification  I recommend reducing afternoon dose of dexamethasone to 2 mg starting this weekend

## 2016-08-23 NOTE — Telephone Encounter (Signed)
Pt's husband says he has not heard from [facility] regarding Temodar refill.  I called Pharmacy and s/w [facility] who said they have not filled Rx yet because they are waiting for ICD-10 code from our office.  Re -sent Rx to [facility] electronically and ordered #17 pill instead of #15 since pt's XRT has been extended by 2 days.   Informed pt and her husband refill sent and to check w/ pharmacy if they do not hear from them by the end of the week.  They verbalized understanding.

## 2016-08-23 NOTE — Assessment & Plan Note (Signed)
So far, she tolerated treatment well but has gained a tremendous amount of weight to the point at the radiation mask is no longer fitting her well At the end of the week, recommend she reduce afternoon dexamethasone to 2 mg but keep at 4 mg in the morning She will continue to take Zofran before treatment to prevent nausea She will continue antimicrobial prophylaxis with Bactrim She will continue antiseizure medications I reinforced the importance of physical therapy and rehabilitation

## 2016-08-24 ENCOUNTER — Ambulatory Visit: Payer: Medicare Other | Admitting: Radiation Oncology

## 2016-08-24 ENCOUNTER — Ambulatory Visit: Payer: Medicare Other | Admitting: Occupational Therapy

## 2016-08-24 ENCOUNTER — Ambulatory Visit: Payer: Medicare Other | Admitting: Physical Therapy

## 2016-08-24 ENCOUNTER — Ambulatory Visit
Admission: RE | Admit: 2016-08-24 | Discharge: 2016-08-24 | Disposition: A | Discharge status: Home / Self Care | Payer: Medicare Other | Source: Ambulatory Visit | Attending: Radiation Oncology | Admitting: Radiation Oncology

## 2016-08-24 ENCOUNTER — Encounter: Payer: Self-pay | Admitting: Occupational Therapy

## 2016-08-24 ENCOUNTER — Ambulatory Visit: Payer: Medicare Other

## 2016-08-24 DIAGNOSIS — G8194 Hemiplegia, unspecified affecting left nondominant side: Secondary | ICD-10-CM | POA: Diagnosis not present

## 2016-08-24 DIAGNOSIS — M6281 Muscle weakness (generalized): Secondary | ICD-10-CM | POA: Diagnosis not present

## 2016-08-24 DIAGNOSIS — C719 Malignant neoplasm of brain, unspecified: Secondary | ICD-10-CM | POA: Diagnosis not present

## 2016-08-24 DIAGNOSIS — Z510 Encounter for antineoplastic radiation therapy: Secondary | ICD-10-CM | POA: Diagnosis not present

## 2016-08-24 DIAGNOSIS — R41844 Frontal lobe and executive function deficit: Secondary | ICD-10-CM

## 2016-08-24 DIAGNOSIS — R2689 Other abnormalities of gait and mobility: Secondary | ICD-10-CM | POA: Diagnosis not present

## 2016-08-24 DIAGNOSIS — R41841 Cognitive communication deficit: Secondary | ICD-10-CM

## 2016-08-24 DIAGNOSIS — R41842 Visuospatial deficit: Secondary | ICD-10-CM

## 2016-08-24 DIAGNOSIS — R278 Other lack of coordination: Secondary | ICD-10-CM | POA: Diagnosis not present

## 2016-08-24 DIAGNOSIS — C711 Malignant neoplasm of frontal lobe: Secondary | ICD-10-CM | POA: Diagnosis not present

## 2016-08-24 MED FILL — TEMOZOLOMIDE 140 MG CAPSULE: 140 | 17 days supply | Qty: 17 | Fill #0

## 2016-08-24 NOTE — Therapy (Signed)
[facility] [address] [place], [place], [address] Phone: [phone]   Fax:  [phone]  Speech Language Pathology Treatment  Patient Details  Name: Crystal Parsons MRN: [id number] Date of Birth: 23-Jan-1946 Referring Provider: Dr. Delice Lesch  Encounter Date: 08/24/2016      End of Session - 08/24/16 1224    Visit Number 4   Number of Visits 17   Date for SLP Re-Evaluation 09/21/16   SLP Start Time 1107   SLP Stop Time  1151   SLP Time Calculation (min) 44 min   Activity Tolerance Patient tolerated treatment well      Past Medical History:  Diagnosis Date  . Anxiety   . Bradycardia    At times with pulse in the 40s  . Brain cancer ([place])    Glioblastoma  . Diarrhea    With blating, improved with gluten-free diet  . Diastolic dysfunction   . Essential hypertension, benign    Always has HTN when at the doctor's office.  . 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 Gout   . Hyperlipidemia   . Lynch syndrome   . Mild aortic sclerosis ([place])   . Mitral valve problem    Mildly thickened mitral valve  . Mitral valve prolapse    Mild, anterior  . MR (mitral regurgitation)    ECHO 08/10/09 shows mild MR again and normal EF. No significant changes from prior ECHO.  Marland Kitchen Palpitations    Occasional, but are not significant. ECHO 08/20/08 - Normal EF (60%), mildly thickened mitral valve with mild anterior mitral valve prolapse, mild MR, mild aortic sclerosis, grade 1 diastolic dysfunction.  . Proteinuria    Likely secondary to Diabetes    Past Surgical History:  Procedure Laterality Date  . ABDOMINAL HYSTERECTOMY    . APPLICATION OF CRANIAL NAVIGATION Right 07/07/2016   Procedure: APPLICATION OF CRANIAL NAVIGATION;  Surgeon: Consuella Lose, MD;  Location: [facility];  Service: Neurosurgery;  Laterality: Right;  . BREAST LUMPECTOMY Left   . CRANIOTOMY Right 07/07/2016   Procedure: CRANIOTOMY TUMOR  EXCISION w/BrainLab;  Surgeon: Consuella Lose, MD;  Location: [facility];  Service: Neurosurgery;  Laterality: Right;  . HEMORROIDECTOMY  2009  . TUBAL LIGATION      There were no vitals filed for this visit.      Subjective Assessment - 08/24/16 1116    Subjective Pt took the day's meds during one morning last week when there were distractions in the home (pt's brother).   Patient is accompained by: --  husband               ADULT SLP TREATMENT - 08/24/16 1133      General Information   Behavior/Cognition Alert;Cooperative;Pleasant mood     Treatment Provided   Treatment provided Cognitive-Linquistic     Cognitive-Linquistic Treatment   Treatment focused on Cognition   Skilled Treatment Pt recalled a few specifics of PT session, and a few specifics from OT, given extra time. Pt took all day's meds on Monday in teh morning due to her brother visiting and out of regular routine. Pt was unable to problem solve without SLP max-total A for how to failsafe her medication box (put it in a [place] folder with "Ask Jenny Reichmann" written on it.. Pt's decr'd attention to detail and selective attention were targeted today with written task re: shopping at a deli. Pt demo'd decr'd emergent awareness and decr'd initiation which req'd SLP mod cues occasionally.  Assessment / Recommendations / Plan   Plan Continue with current plan of care     Progression Toward Goals   Progression toward goals Progressing toward goals          SLP Education - 08/24/16 1224    Education provided Yes   Education Details problem solving for med administration   Person(s) Educated Patient;Spouse   Methods Explanation;Verbal cues   Comprehension Verbalized understanding          SLP Short Term Goals - 08/24/16 1226      SLP SHORT TERM GOAL #1   Title Pt will utilize external aids (notebook, timer) to manage schedule, lists, meds with rare min A over 2 sessions   Time 2   Period Weeks   Status  On-going     SLP SHORT TERM GOAL #2   Title Pt will alternate attention between 2 simple cognitive lingusitic tasks with 90% on each and occasional min A   Time 2   Period Weeks   Status On-going     SLP SHORT TERM GOAL #3   Title Pt will verbalize 3 cognitive impairments over 2 sessions with rare min A   Time 2   Period Weeks   Status On-going     SLP SHORT TERM GOAL #4   Title Pt will perform mildly complex reasoning, functional math, attention to detail problems with 85% accuracy and occasional min A   Time 2   Period Weeks   Status On-going          SLP Long Term Goals - 08/24/16 1226      SLP LONG TERM GOAL #1   Title Pt will demonstrate anticiaptory awareness self correcting errors on cognitive tasks with occasional min A   Time 6   Period Weeks   Status On-going     SLP LONG TERM GOAL #2   Title Pt will divide attention between 2 simple cognitive lingusitic tasks with 85% on each and occasional min A   Time 6   Period Weeks   Status On-going     SLP LONG TERM GOAL #3   Title Pt will solve moderately complex reasoning, organizing and attention to detail problems with 90% accuracy and rare min A   Time 6   Period Weeks   Status On-going          Plan - 08/24/16 1225    Clinical Impression Statement Pt presents today with cont'd cognitive linguistic deficits. SLP assisted pt today with problem solving in developing a plan for troubleshooting difficulty wiht med administration, as well skilled ST for attention and awareness. Skilled ST remains necessary to improve pt independence.    Speech Therapy Frequency 2x / week   Duration --  8 weeks or 16 visis   Treatment/Interventions Compensatory strategies;Functional tasks;Cueing hierarchy;Patient/family education;Multimodal communcation approach;Internal/external aids;SLP instruction and feedback;Cognitive reorganization;Environmental controls   Potential to Achieve Goals Fair   Potential Considerations  Co-morbidities   Consulted and Agree with Plan of Care Patient      Patient will benefit from skilled therapeutic intervention in order to improve the following deficits and impairments:   Cognitive communication deficit    Problem List Patient Active Problem List   Diagnosis Date Noted  . Elevated liver enzymes 08/23/2016  . Anxiety attack 08/16/2016  . Other constipation 08/09/2016  . 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 Polyposis syndrome, familial 07/20/2016  . H/O: hysterectomy 07/20/2016  . Leukocytosis   . Monocytosis   .  Acute lower UTI   . GBM (glioblastoma multiforme) ([place])   . Glioblastoma multiforme of brain ([place]) 07/11/2016  . MR (mitral regurgitation)   . Brain tumor ([place])   . Apraxia   . Left-sided weakness   . Steroid-induced hyperglycemia   . Leucocytosis   . Benign essential HTN   . History of gout   . Dysuria   . Acute respiratory failure with hypoxia ([place])   . Brain mass   . Seizure ([place]) 06/30/2016  . Status epilepticus ([place]) 06/30/2016    Sullivan ,[place], Ignacio  08/24/2016, 12:27 PM  [facility] [address] [place], [place], [address] Phone: [phone]   Fax:  [phone]   Name: JATIA GIACOMINI MRN: [id number] Date of Birth: March 07, 1946

## 2016-08-24 NOTE — Therapy (Signed)
[facility] [address] [place], [place], [address] Phone: [phone]   Fax:  [phone]  Occupational Therapy Treatment  Patient Details  Name: Crystal Parsons MRN: [id number] Date of Birth: [birth year] Referring Provider: Dr. Delice Lesch  Encounter Date: 08/24/2016      OT End of Session - 08/24/16 1128    Visit Number 4   Number of Visits 16   Date for OT Re-Evaluation 09/27/16   Authorization Type Medicare - will need G code and PN every 10th visit   Authorization Time Period 60 days   Authorization - Visit Number 4   Authorization - Number of Visits 10   OT Start Time 0932   OT Stop Time [id number]   OT Time Calculation (min) 42 min   Activity Tolerance Patient tolerated treatment well      Past Medical History:  Diagnosis Date  . Anxiety   . Bradycardia    At times with pulse in the 40s  . Brain cancer ([place])    Glioblastoma  . Diarrhea    With blating, improved with gluten-free diet  . Diastolic dysfunction   . Essential hypertension, benign    Always has HTN when at the doctor's office.  . Glioblastoma multiforme of brain ([facility]) 07/11/2016  . Gout   . Hyperlipidemia   . Lynch syndrome   . Mild aortic sclerosis ([place])   . Mitral valve problem    Mildly thickened mitral valve  . Mitral valve prolapse    Mild, anterior  . MR (mitral regurgitation)    ECHO 08/10/09 shows mild MR again and normal EF. No significant changes from prior ECHO.  Marland Kitchen Palpitations    Occasional, but are not significant. ECHO 08/20/08 - Normal EF (60%), mildly thickened mitral valve with mild anterior mitral valve prolapse, mild MR, mild aortic sclerosis, grade 1 diastolic dysfunction.  . Proteinuria    Likely secondary to Diabetes    Past Surgical History:  Procedure Laterality Date  . ABDOMINAL HYSTERECTOMY    . APPLICATION OF CRANIAL NAVIGATION Right 07/07/2016   Procedure: APPLICATION OF CRANIAL NAVIGATION;  Surgeon:  Consuella Lose, MD;  Location: [facility];  Service: Neurosurgery;  Laterality: Right;  . BREAST LUMPECTOMY Left   . CRANIOTOMY Right 07/07/2016   Procedure: CRANIOTOMY TUMOR EXCISION w/BrainLab;  Surgeon: Consuella Lose, MD;  Location: [facility];  Service: Neurosurgery;  Laterality: Right;  . HEMORROIDECTOMY  2009  . TUBAL LIGATION      There were no vitals filed for this visit.      Subjective Assessment - 08/24/16 0938    Subjective  My legs feel weak when I go from sitting to standing and my hair is falling out.    Pertinent History see epic pt with R frontal mass x2 s/p crani starts radiation and chemo today;    Patient Stated Goals I want to feel like myself again   Currently in Pain? No/denies                      OT Treatments/Exercises (OP) - 08/24/16 0001      ADLs   Cooking Addressed simple famiilar hot meal prep (scranmbled eggs) and clean up with emphasis on functional mobility, selective and alterntating attention, functional use of LUE, recall of info, safety and activity tolerance.  Pt demonstrated good safety awareness. Pt did leave dishwasher and cabinets open however when pt and husband were questioned they both stated she has always done  this before this event (pt' s husband stated "I am always bumping my head into the cabinets she has left open").  Pt with activity tolerance for this activity.  Enouraged pt to resume simple every day cooking at home with husband available for distant supervision as pt is only using microwave at home at this time. Pt and husband in agreement.  Discussed beginning to turn some activities back over to pt - husband reluctant but stated "I know I need to start doing that."              Balance Exercises - 08/24/16 1028      Balance Exercises: Standing   Rockerboard Anterior/posterior;Lateral;Head turns;10 reps   Balance Beam blue foam beam: side stepping and tandem walking with intermittent UE support              OT Short Term Goals - 08/24/16 1126      OT SHORT TERM GOAL #1   Title Pt and husband will be mod I with HEP - 08/30/2106   Time 4   Period Weeks   Status On-going     OT SHORT TERM GOAL #2   Title Pt will decrease time on 9 hold peg with L hand by at least 4 seconds (baseline = 38.25) to assist with fine motor tasks   Time 4   Period Weeks   Status On-going     OT SHORT TERM GOAL #3   Title Pt will require supervision for simple, familiar hot meal prep   Time 4   Period Weeks   Status On-going     OT SHORT TERM GOAL #4   Title Pt will demonstate ability to follow 2-3 step instruction within context of functional, familar task.    Time 4   Period Weeks   Status On-going     OT SHORT TERM GOAL #5   Title Pt will identify at least 2 strategies for ST memory    Time 4   Period Weeks   Status On-going     OT SHORT TERM GOAL #6   Title Pt will require no more than 2 vc's for selective attention during functional familar tasks in a busy environment.    Time 4   Period Weeks   Status On-going           OT Long Term Goals - 08/24/16 1126      OT LONG TERM GOAL #1   Title Pt and husband will be mod I with upgraded HEP prn - 09/27/2016   Time 8   Period Weeks   Status On-going     OT LONG TERM GOAL #2   Title Pt will decrease time on 9 hole peg test with L hand by at least 8 seconds (baseline=38.25) to assist with functional tasks.   Time 8   Period Weeks   Status On-going     OT LONG TERM GOAL #3   Title Pt will increase grip strength by 5 pounds to assist with functional use of L, non dominant hand (baseline= 30 pounds)   Time 8   Period Weeks   Status On-going     OT LONG TERM GOAL #4   Title Pt will be mod I with cooking 2 items simultaneously for altenating attention and safety with familiar hot meal prep.    Time 8   Period Weeks   Status On-going     OT LONG TERM GOAL #5   Title Pt will be mod  I for laundry and simple home mgmt tasks.   Time 8    Period Weeks   Status On-going     OT LONG TERM GOAL #6   Title Pt will be able to set up med pill box with supervision only   Time 8   Period Weeks   Status On-going     OT LONG TERM GOAL #7   Title Pt will be mod I to attend to L during functional tasks.    Time 8   Period Weeks   Status On-going               Plan - 08/24/16 1126    Clinical Impression Statement Pt progressing toward goals.  Pt states she is begining to feel some returned interest in cooking.   Rehab Potential Good   OT Frequency 2x / week   OT Duration 8 weeks   OT Treatment/Interventions Self-care/ADL training;DME and/or AE instruction;Neuromuscular education;Therapeutic exercise;Therapeutic activities;Patient/family education;Visual/perceptual remediation/compensation;Cognitive remediation/compensation   Plan address following directions, selective and alternating attention, memory, problem solving, LUE functional use, strength and activity tolerance.    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 Elevated liver enzymes 08/23/2016  . Anxiety attack 08/16/2016  . Other constipation 08/09/2016  . Malignant frontal lobe tumor ([facility])   . Polyposis syndrome, familial 07/20/2016  . H/O: hysterectomy 07/20/2016  .  Leukocytosis   . Monocytosis   . Acute lower UTI   . GBM (glioblastoma multiforme) ([facility])   . Glioblastoma multiforme of brain ([facility]) 07/11/2016  . MR (mitral regurgitation)   . Brain tumor ([facility])   . Apraxia   . Left-sided weakness   . Steroid-induced hyperglycemia   . Leucocytosis   . Benign essential HTN   . History of gout   . Dysuria   . Acute respiratory failure with hypoxia ([facility])   . Brain mass   . Seizure ([facility]) 06/30/2016  . Status epilepticus ([facility]) 06/30/2016    Quay Burow, OTR/L 08/24/2016, 11:29 AM  [facility] [address] [place], [place], [address] Phone: [phone]   Fax:  [phone]  Name: DOLLICIA ROHMAN MRN: [id number] Date of Birth: 03/05/1946

## 2016-08-24 NOTE — Therapy (Signed)
[facility] [address] [place], [place], [address] Phone: [phone]   Fax:  [phone]  Physical Therapy Treatment  Patient Details  Name: Crystal Parsons MRN: [id number] Date of Birth: 1946/01/01 Referring Provider: Dr. Posey Pronto  Encounter Date: 08/24/2016      PT End of Session - 08/24/16 1054    Visit Number 4   Number of Visits 9   Date for PT Re-Evaluation 09/01/16   Authorization Type Medicare primary and BCBS secondary. G-CODE AND PROGRESS NOTE EVERY 10TH VISIT.    PT Start Time 1013   PT Stop Time 1055   PT Time Calculation (min) 42 min   Equipment Utilized During Treatment Gait belt   Activity Tolerance Patient tolerated treatment well   Behavior During Therapy WFL for tasks assessed/performed      Past Medical History:  Diagnosis Date  . Anxiety   . Bradycardia    At times with pulse in the 40s  . Brain cancer ([facility])    Glioblastoma  . Diarrhea    With blating, improved with gluten-free diet  . Diastolic dysfunction   . Essential hypertension, benign    Always has HTN when at the doctor's office.  . Glioblastoma multiforme of brain ([facility]) 07/11/2016  . Gout   . Hyperlipidemia   . Lynch syndrome   . Mild aortic sclerosis ([facility])   . Mitral valve problem    Mildly thickened mitral valve  . Mitral valve prolapse    Mild, anterior  . MR (mitral regurgitation)    ECHO 08/10/09 shows mild MR again and normal EF. No significant changes from prior ECHO.  Marland Kitchen Palpitations    Occasional, but are not significant. ECHO 08/20/08 - Normal EF (60%), mildly thickened mitral valve with mild anterior mitral valve prolapse, mild MR, mild aortic sclerosis, grade 1 diastolic dysfunction.  . Proteinuria    Likely secondary to Diabetes    Past Surgical History:  Procedure Laterality Date  . ABDOMINAL HYSTERECTOMY    . APPLICATION OF CRANIAL NAVIGATION Right 07/07/2016   Procedure: APPLICATION OF CRANIAL  NAVIGATION;  Surgeon: Consuella Lose, MD;  Location: [facility];  Service: Neurosurgery;  Laterality: Right;  . BREAST LUMPECTOMY Left   . CRANIOTOMY Right 07/07/2016   Procedure: CRANIOTOMY TUMOR EXCISION w/BrainLab;  Surgeon: Consuella Lose, MD;  Location: [facility];  Service: Neurosurgery;  Laterality: Right;  . HEMORROIDECTOMY  2009  . TUBAL LIGATION      There were no vitals filed for this visit.      Subjective Assessment - 08/24/16 1015    Subjective doing well, no complaints   Pertinent History HTN, Lynch syndrome, gout, urinary incontinence, seizures, palpitations    Currently in Pain? No/denies                         [facility] Adult PT Treatment/Exercise - 08/24/16 1040      Knee/Hip Exercises: Standing   Forward Step Up Both;10 reps;Hand Hold: 0;Hand Hold: 1;Step Height: 4"   Forward Step Up Limitations min cues to decrease UE support     Knee/Hip Exercises: Seated   Long Arc Quad Both;2 sets;10 reps;Weights   Long Arc Quad Weight 3 lbs.   Hamstring Curl Both;2 sets;10 reps   Hamstring Limitations green theraband   Sit to Sand 10 reps;without UE support     Knee/Hip Exercises: Supine   Bridges Both;2 sets;10 reps  Balance Exercises - 08/24/16 1028      Balance Exercises: Standing   Rockerboard Anterior/posterior;Lateral;Head turns;10 reps   Balance Beam blue foam beam: side stepping and tandem walking with intermittent UE support             PT Short Term Goals - 08/02/16 1334      PT SHORT TERM GOAL #1   Title same as LTGs            PT Long Term Goals - 08/24/16 1057      PT LONG TERM GOAL #1   Title Pt will be IND in HEP to improve endurance, strength, and balance. TARGET DATE FOR ALL LTGS: 08/30/16   Status On-going     PT LONG TERM GOAL #2   Title Pt will improve FGA score to >/=25/30 to decr. falls risk.    Status On-going     PT LONG TERM GOAL #3   Title Pt will improve gait speed to >/=3.33ft/sec., no  AD, to amb. safely in the community.    Status On-going     PT LONG TERM GOAL #4   Title Pt will amb. 1000' over even/uneven terrain, IND, in order to improve functional mobility.    Status On-going     PT LONG TERM GOAL #5   Title Perform 6MWT and write goal as indicated.    Status Achieved     PT LONG TERM GOAL #6   Title improve 6MWT to > 1300' for improved activity and endurance.   Status New               Plan - 08/24/16 1054    Clinical Impression Statement Session today focused on balance and strengthening of lower extremities (pt stating "my knees are weak").  Tolerated session well, just needing frequent cues for attention to task.  Will conitnue to benefit from PT to maximize function.   PT Treatment/Interventions ADLs/Self Care Home Management;Biofeedback;DME Instruction;Gait training;Stair training;Functional mobility training;Therapeutic activities;Therapeutic exercise;Balance training;Neuromuscular re-education;Manual techniques;Orthotic Fit/Training;Patient/family education;Vestibular   PT Next Visit Plan dynamic standing balance/stepping strategies, continue with LE strengthening      Patient will benefit from skilled therapeutic intervention in order to improve the following deficits and impairments:  Abnormal gait, Decreased endurance, Decreased knowledge of use of DME, Decreased balance, Decreased mobility, Decreased cognition, Impaired flexibility  Visit Diagnosis: Muscle weakness (generalized)  Other abnormalities of gait and mobility     Problem List Patient Active Problem List   Diagnosis Date Noted  . Elevated liver enzymes 08/23/2016  . Anxiety attack 08/16/2016  . Other constipation 08/09/2016  . Malignant frontal lobe tumor ([facility])   . Polyposis syndrome, familial 07/20/2016  . H/O: hysterectomy 07/20/2016  . Leukocytosis   . Monocytosis   . Acute lower UTI   . GBM (glioblastoma multiforme) ([facility])   . Glioblastoma multiforme of brain ([facility] [facility])  07/11/2016  . MR (mitral regurgitation)   . Brain tumor ([place] [facility])   . Apraxia   . Left-sided weakness   . Steroid-induced hyperglycemia   . Leucocytosis   . Benign essential HTN   . History of gout   . Dysuria   . Acute respiratory failure with hypoxia ([facility])   . Brain mass   . Seizure ([facility]) 06/30/2016  . Status epilepticus ([facility]) 06/30/2016       Laureen Abrahams, PT, DPT 08/24/16 10:59 AM    [facility] [address] [place], [place], [address] Phone: [phone]  Fax:  [phone]  Name: Crystal Parsons MRN: [id number] Date of Birth: 07/28/1946

## 2016-08-24 NOTE — Patient Instructions (Signed)
  Please complete the assigned speech therapy homework and return it to your next session.  

## 2016-08-25 ENCOUNTER — Ambulatory Visit
Admission: RE | Admit: 2016-08-25 | Discharge: 2016-08-25 | Disposition: A | Discharge status: Home / Self Care | Payer: Medicare Other | Source: Ambulatory Visit | Attending: Radiation Oncology | Admitting: Radiation Oncology

## 2016-08-25 ENCOUNTER — Inpatient Hospital Stay
Admission: RE | Admit: 2016-08-25 | Discharge: 2016-08-25 | Disposition: A | Discharge status: Home / Self Care | Payer: Self-pay | Source: Ambulatory Visit | Attending: Radiation Oncology | Admitting: Radiation Oncology

## 2016-08-25 DIAGNOSIS — Z510 Encounter for antineoplastic radiation therapy: Secondary | ICD-10-CM | POA: Diagnosis not present

## 2016-08-25 DIAGNOSIS — C711 Malignant neoplasm of frontal lobe: Secondary | ICD-10-CM | POA: Diagnosis not present

## 2016-08-25 DIAGNOSIS — C719 Malignant neoplasm of brain, unspecified: Secondary | ICD-10-CM | POA: Diagnosis not present

## 2016-08-26 ENCOUNTER — Ambulatory Visit: Payer: Medicare Other | Admitting: Occupational Therapy

## 2016-08-26 ENCOUNTER — Ambulatory Visit
Admission: RE | Admit: 2016-08-26 | Discharge: 2016-08-26 | Disposition: A | Discharge status: Home / Self Care | Payer: Medicare Other | Source: Ambulatory Visit | Attending: Radiation Oncology | Admitting: Radiation Oncology

## 2016-08-26 ENCOUNTER — Ambulatory Visit: Payer: Medicare Other

## 2016-08-26 ENCOUNTER — Ambulatory Visit: Payer: Medicare Other | Admitting: Physical Therapy

## 2016-08-26 VITALS — BP 118/60 | HR 87 | Resp 18 | Wt 194.4 lb

## 2016-08-26 DIAGNOSIS — C719 Malignant neoplasm of brain, unspecified: Secondary | ICD-10-CM | POA: Diagnosis not present

## 2016-08-26 DIAGNOSIS — R2689 Other abnormalities of gait and mobility: Secondary | ICD-10-CM | POA: Diagnosis not present

## 2016-08-26 DIAGNOSIS — G8194 Hemiplegia, unspecified affecting left nondominant side: Secondary | ICD-10-CM

## 2016-08-26 DIAGNOSIS — R278 Other lack of coordination: Secondary | ICD-10-CM | POA: Diagnosis not present

## 2016-08-26 DIAGNOSIS — R41844 Frontal lobe and executive function deficit: Secondary | ICD-10-CM

## 2016-08-26 DIAGNOSIS — M6281 Muscle weakness (generalized): Secondary | ICD-10-CM | POA: Diagnosis not present

## 2016-08-26 DIAGNOSIS — R41841 Cognitive communication deficit: Secondary | ICD-10-CM

## 2016-08-26 DIAGNOSIS — R41842 Visuospatial deficit: Secondary | ICD-10-CM | POA: Diagnosis not present

## 2016-08-26 DIAGNOSIS — C711 Malignant neoplasm of frontal lobe: Secondary | ICD-10-CM | POA: Diagnosis not present

## 2016-08-26 DIAGNOSIS — Z510 Encounter for antineoplastic radiation therapy: Secondary | ICD-10-CM | POA: Diagnosis not present

## 2016-08-26 NOTE — Therapy (Signed)
[facility] [address] [place], [place], [address] Phone: [phone]   Fax:  [phone]  Physical Therapy Treatment  Patient Details  Name: Crystal Parsons MRN: [id number] Date of Birth: October 02, 1945 Referring Provider: Dr. Posey Pronto  Encounter Date: 08/26/2016      PT End of Session - 08/26/16 1948    Visit Number 5   Number of Visits 9   Date for PT Re-Evaluation 09/01/16   Authorization Type Medicare primary and BCBS secondary. G-CODE AND PROGRESS NOTE EVERY 10TH VISIT.    PT Start Time 1017   PT Stop Time 1102   PT Time Calculation (min) 45 min   Activity Tolerance Patient tolerated treatment well   Behavior During Therapy WFL for tasks assessed/performed      Past Medical History:  Diagnosis Date  . Anxiety   . Bradycardia    At times with pulse in the 40s  . Brain cancer ([place])    Glioblastoma  . Diarrhea    With blating, improved with gluten-free diet  . Diastolic dysfunction   . Essential hypertension, benign    Always has HTN when at the doctor's office.  . Glioblastoma multiforme of brain ([facility]) 07/11/2016  . Gout   . Hyperlipidemia   . Lynch syndrome   . Mild aortic sclerosis ([facility])   . Mitral valve problem    Mildly thickened mitral valve  . Mitral valve prolapse    Mild, anterior  . MR (mitral regurgitation)    ECHO 08/10/09 shows mild MR again and normal EF. No significant changes from prior ECHO.  Marland Kitchen Palpitations    Occasional, but are not significant. ECHO 08/20/08 - Normal EF (60%), mildly thickened mitral valve with mild anterior mitral valve prolapse, mild MR, mild aortic sclerosis, grade 1 diastolic dysfunction.  . Proteinuria    Likely secondary to Diabetes    Past Surgical History:  Procedure Laterality Date  . ABDOMINAL HYSTERECTOMY    . APPLICATION OF CRANIAL NAVIGATION Right 07/07/2016   Procedure: APPLICATION OF CRANIAL NAVIGATION;  Surgeon: Consuella Lose, MD;  Location: [facility];  Service: Neurosurgery;  Laterality: Right;  . BREAST LUMPECTOMY Left   . CRANIOTOMY Right 07/07/2016   Procedure: CRANIOTOMY TUMOR EXCISION w/BrainLab;  Surgeon: Consuella Lose, MD;  Location: [facility];  Service: Neurosurgery;  Laterality: Right;  . HEMORROIDECTOMY  2009  . TUBAL LIGATION      There were no vitals filed for this visit.      Subjective Assessment - 08/26/16 1019    Subjective Reports low back pain when she gets out of her walk-in shower. She sits for just a few minutes and it goes away. She states she has not done HEP often due to frequent appointments (she has 5 medical appointments today).   Patient is accompained by: Family member  spouse, John   Pertinent History HTN, Lynch syndrome, gout, urinary incontinence, seizures, palpitations    Currently in Pain? No/denies                         [facility] Adult PT Treatment/Exercise - 08/26/16 0001      Bed Mobility   Bed Mobility Supine to Sit;Sit to Supine   Supine to Sit 6: Modified independent (Device/Increase time)   Sit to Supine 6: Modified independent (Device/Increase time)     Transfers   Transfers Sit to Stand;Stand to Sit   Sit to Stand 7: Independent;Without upper extremity assist;From chair/3-in-1  Stand to Sit 7: Independent;Without upper extremity assist;To chair/3-in-1   Number of Reps 10 reps   Transfer Cueing vc to scoot to edge of chair to allow feet to come back under her knees     Ambulation/Gait   Ambulation/Gait Assistance 7: Independent   Ambulation/Gait Assistance Details for safety over unlevel outdoor terrain   Ambulation Distance (Feet) 1100 Feet   Assistive device None   Gait Pattern Step-through pattern;Decreased stride length   Ambulation Surface Unlevel;Outdoor;Paved;Gravel;Grass   Ramp 7: Independent   Gait Comments negotiated up/down multiple curb/cuts (ramp-like) without difficulty     Exercises   Exercises Lumbar     Lumbar Exercises: Stretches   Single  Knee to Chest Stretch 2 reps;20 seconds   Single Knee to Chest Stretch Limitations did not stretch the middle low back where she feels the pain after 5 min static standing (shower, cooking)     Lumbar Exercises: Seated   Other Seated Lumbar Exercises seated posterior pelvic tiltfor Low back stretch   Other Seated Lumbar Exercises seated forward bend/reach to floor to stretch low back--this position did stretch the area where she has pain                PT Education - 08/26/16 1945    Education provided Yes   Education Details Low back pain appears to occur when she has been standing (and sometimes when walking) for >5 minutes. Educated on a variety of stretches for the low back.    Person(s) Educated Patient;Spouse   Methods Explanation;Demonstration;Handout   Comprehension Verbalized understanding;Returned demonstration          PT Short Term Goals - 08/02/16 1334      PT SHORT TERM GOAL #1   Title same as LTGs            PT Long Term Goals - 08/26/16 1958      PT LONG TERM GOAL #4   Title Pt will amb. 1000' over even/uneven terrain, IND, in order to improve functional mobility.    Status Achieved               Plan - 08/26/16 1949    Clinical Impression Statement Skilled PT focused on gait training on unlevel outdoor surfaces. Patient reporting repeated low back pain when takes her shower and problem-solved likely cause (essentially static standing x 5+ minutes) and educated on several stretches for the low back.    Rehab Potential Good   Clinical Impairments Affecting Rehab Potential GBM   PT Frequency 2x / week   PT Duration 4 weeks   PT Treatment/Interventions ADLs/Self Care Home Management;Biofeedback;DME Instruction;Gait training;Stair training;Functional mobility training;Therapeutic activities;Therapeutic exercise;Balance training;Neuromuscular re-education;Manual techniques;Orthotic Fit/Training;Patient/family education;Vestibular   PT Next Visit  Plan check goals; Discuss if feels ready to d/c PT on 2/01 or if need to extend;   PT Home Exercise Plan sit to stand; walking program    Consulted and Agree with Plan of Care Family member/caregiver;Patient   Family Member Consulted spouse      Patient will benefit from skilled therapeutic intervention in order to improve the following deficits and impairments:  Abnormal gait, Decreased endurance, Decreased knowledge of use of DME, Decreased balance, Decreased mobility, Decreased cognition, Impaired flexibility  Visit Diagnosis: Muscle weakness (generalized)  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 Elevated liver enzymes 08/23/2016  . Anxiety attack  08/16/2016  . Other constipation 08/09/2016  . 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 Polyposis syndrome, familial 07/20/2016  . H/O: hysterectomy 07/20/2016  . Leukocytosis   . Monocytosis   . Acute lower UTI   . GBM (glioblastoma multiforme) ([place])   . Glioblastoma multiforme of brain ([place]) 07/11/2016  . MR (mitral regurgitation)   . Brain tumor ([place])   . Apraxia   . Left-sided weakness   . Steroid-induced hyperglycemia   . Leucocytosis   . Benign essential HTN   . History of gout   . Dysuria   . Acute respiratory failure with hypoxia ([place])   . Brain mass   . Seizure ([place]) 06/30/2016  . Status epilepticus ([place]) 06/30/2016    Rexanne Mano, PT 08/26/2016, 8:02 PM  [facility] [address] [place], [place], [address] Phone: [phone]   Fax:  [phone]  Name: Crystal Parsons MRN: [id number] Date of Birth: [birth year]

## 2016-08-26 NOTE — Progress Notes (Signed)
Weight and vitals stable. Reports chronic knee pain continues. Working with PT to resolve knee pain through exercise. Starting tomorrow patient to taper down to dex 4 mg in the AM and 2 mg in the PM. No evidence of thrush noted. Alopecia noted without desquamation. Reports Biafine is causing her forehead to itch. Instructed patient to begin using pure aloe instead of biafine. Rash noted on patient's chest. Reports she had a similar rash years ago which her dermatologist managed with steroid cream. Explains chest rash is not related to effects of radiation and advised patient to follow up with dermatologist. Reports an occasional headache in the morning which tylenol cures. Denies dizziness, nausea or vomiting. Denies diplopia or tinnitus.   BP 118/60 (BP Location: Left Arm, Patient Position: Sitting, Cuff Size: Normal)   Pulse 87   Resp 18   Wt 194 lb 6.4 oz (88.2 kg)   SpO2 98%   BMI 34.44 kg/m  Wt Readings from Last 3 Encounters:  08/26/16 194 lb 6.4 oz (88.2 kg)  08/23/16 195 lb 9.6 oz (88.7 kg)  08/19/16 193 lb 6.4 oz (87.7 kg)

## 2016-08-26 NOTE — Therapy (Signed)
[facility] [address] [place], [place], [address] Phone: [phone]   Fax:  [phone]  Occupational Therapy Treatment  Patient Details  Name: Crystal Parsons MRN: [id number] Date of Birth: Jul 28, 1946 Referring Provider: Dr. Delice Lesch  Encounter Date: 08/26/2016      OT End of Session - 08/26/16 1256    Visit Number 5   Number of Visits 16   Date for OT Re-Evaluation 09/27/16   Authorization Type Medicare - will need G code and PN every 10th visit   Authorization Time Period 60 days   Authorization - Visit Number 5   Authorization - Number of Visits 10   OT Start Time 1103   OT Stop Time 1144   OT Time Calculation (min) 41 min   Activity Tolerance Patient tolerated treatment well   Behavior During Therapy [facility] [facility] for tasks assessed/performed      Past Medical History:  Diagnosis Date  . Anxiety   . Bradycardia    At times with pulse in the 40s  . Brain cancer ([place])    Glioblastoma  . Diarrhea    With blating, improved with gluten-free diet  . Diastolic dysfunction   . Essential hypertension, benign    Always has HTN when at the doctor's office.  . 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 Gout   . Hyperlipidemia   . Lynch syndrome   . 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 Mitral valve problem    Mildly thickened mitral valve  . Mitral valve prolapse    Mild, anterior  . MR (mitral regurgitation)    ECHO 08/10/09 shows mild MR again and normal EF. No significant changes from prior ECHO.  Marland Kitchen Palpitations    Occasional, but are not significant. ECHO 08/20/08 - Normal EF (60%), mildly thickened mitral valve with mild anterior mitral valve prolapse, mild MR, mild aortic sclerosis, grade 1 diastolic dysfunction.  . Proteinuria    Likely secondary to Diabetes    Past Surgical History:  Procedure Laterality Date  . ABDOMINAL HYSTERECTOMY    . APPLICATION OF CRANIAL NAVIGATION Right 07/07/2016    Procedure: APPLICATION OF CRANIAL NAVIGATION;  Surgeon: Consuella Lose, MD;  Location: [facility];  Service: Neurosurgery;  Laterality: Right;  . BREAST LUMPECTOMY Left   . CRANIOTOMY Right 07/07/2016   Procedure: CRANIOTOMY TUMOR EXCISION w/BrainLab;  Surgeon: Consuella Lose, MD;  Location: [facility];  Service: Neurosurgery;  Laterality: Right;  . HEMORROIDECTOMY  2009  . TUBAL LIGATION      There were no vitals filed for this visit.      Subjective Assessment - 08/26/16 1106    Patient Stated Goals I want to feel like myself again   Currently in Pain? Yes   Pain Location Back   Pain Descriptors / Indicators Aching   Pain Type Acute pain   Pain Onset More than a month ago   Pain Frequency Intermittent   Aggravating Factors  standing in 1 position   Pain Relieving Factors sitting   Multiple Pain Sites Yes           Pipe tree design in mod distracting environment, for attention, problem solving, and LUE functional use, 3 trials, with increasing difficulty. Pt required mod v.c. For trial 1, then min v.c for trial 2, mod/ max assist with trial 3 as it was increasingly difficult. Pt required v.c to resume task when she became distracted making conversation. Red putty exercises for sustained grip, pt was  encouraged to perform at home while watching TV.                      OT Short Term Goals - 08/24/16 1126      OT SHORT TERM GOAL #1   Title Pt and husband will be mod I with HEP - 08/30/2106   Time 4   Period Weeks   Status On-going     OT SHORT TERM GOAL #2   Title Pt will decrease time on 9 hold peg with L hand by at least 4 seconds (baseline = 38.25) to assist with fine motor tasks   Time 4   Period Weeks   Status On-going     OT SHORT TERM GOAL #3   Title Pt will require supervision for simple, familiar hot meal prep   Time 4   Period Weeks   Status On-going     OT SHORT TERM GOAL #4   Title Pt will demonstate ability to follow 2-3 step instruction  within context of functional, familar task.    Time 4   Period Weeks   Status On-going     OT SHORT TERM GOAL #5   Title Pt will identify at least 2 strategies for ST memory    Time 4   Period Weeks   Status On-going     OT SHORT TERM GOAL #6   Title Pt will require no more than 2 vc's for selective attention during functional familar tasks in a busy environment.    Time 4   Period Weeks   Status On-going           OT Long Term Goals - 08/24/16 1126      OT LONG TERM GOAL #1   Title Pt and husband will be mod I with upgraded HEP prn - 09/27/2016   Time 8   Period Weeks   Status On-going     OT LONG TERM GOAL #2   Title Pt will decrease time on 9 hole peg test with L hand by at least 8 seconds (baseline=38.25) to assist with functional tasks.   Time 8   Period Weeks   Status On-going     OT LONG TERM GOAL #3   Title Pt will increase grip strength by 5 pounds to assist with functional use of L, non dominant hand (baseline= 30 pounds)   Time 8   Period Weeks   Status On-going     OT LONG TERM GOAL #4   Title Pt will be mod I with cooking 2 items simultaneously for altenating attention and safety with familiar hot meal prep.    Time 8   Period Weeks   Status On-going     OT LONG TERM GOAL #5   Title Pt will be mod I for laundry and simple home mgmt tasks.   Time 8   Period Weeks   Status On-going     OT LONG TERM GOAL #6   Title Pt will be able to set up med pill box with supervision only   Time 8   Period Weeks   Status On-going     OT LONG TERM GOAL #7   Title Pt will be mod I to attend to L during functional tasks.    Time 8   Period Weeks   Status On-going               Plan - 08/26/16 1254    Clinical Impression  Statement Pt reports she has attmpted cooking egss at home and it went well.   Rehab Potential Good   OT Frequency 2x / week   OT Duration 8 weeks   OT Treatment/Interventions Self-care/ADL training;DME and/or AE  instruction;Neuromuscular education;Therapeutic exercise;Therapeutic activities;Patient/family education;Visual/perceptual remediation/compensation;Cognitive remediation/compensation   Plan address home management, atttention, problem solving and LUE functional use   Consulted and Agree with Plan of Care Patient;Family member/caregiver   Family Member Consulted husband John      Patient will benefit from skilled therapeutic intervention in order to improve the following deficits and impairments:  Decreased activity tolerance, Decreased coordination, Decreased cognition, Decreased safety awareness, Decreased strength, Impaired UE functional use, Impaired vision/preception  Visit Diagnosis: Muscle weakness (generalized)  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 Elevated liver enzymes 08/23/2016  . Anxiety attack 08/16/2016  . Other constipation 08/09/2016  . Malignant frontal lobe tumor ([facility])   . Polyposis syndrome, familial 07/20/2016  . H/O: hysterectomy 07/20/2016  . Leukocytosis   . Monocytosis   . Acute lower UTI   . GBM (glioblastoma multiforme) ([facility])   . 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 MR (mitral regurgitation)   . Brain tumor ([facility])   . Apraxia   . Left-sided weakness   . Steroid-induced hyperglycemia   . Leucocytosis   . Benign essential HTN   . History of gout   . Dysuria   . Acute respiratory failure with hypoxia ([place])   . Brain mass   . Seizure ([facility]) 06/30/2016  . Status epilepticus ([facility]) 06/30/2016    Crystal Parsons 08/26/2016, 12:58 PM  [facility] [address] [place], [place], [address] Phone: [phone]   Fax:  [phone]  Name: Crystal Parsons MRN:  [id number] Date of Birth: 01/15/1946

## 2016-08-26 NOTE — Patient Instructions (Signed)
  Please complete the assigned speech therapy homework and return it to your next session.  

## 2016-08-26 NOTE — Therapy (Signed)
[facility] [address] [place], [place], [address] Phone: [phone]   Fax:  [phone]  Speech Language Pathology Treatment  Patient Details  Name: Crystal Parsons MRN: [id number] Date of Birth: [birth year] Referring Provider: Dr. Delice Lesch  Encounter Date: 08/26/2016      End of Session - 08/26/16 1350    Visit Number 5   Number of Visits 17   Date for SLP Re-Evaluation 09/21/16   SLP Start Time 1147   SLP Stop Time  1230   SLP Time Calculation (min) 43 min   Activity Tolerance Patient tolerated treatment well      Past Medical History:  Diagnosis Date  . Anxiety   . Bradycardia    At times with pulse in the 40s  . Brain cancer ([facility])    Glioblastoma  . Diarrhea    With blating, improved with gluten-free diet  . Diastolic dysfunction   . Essential hypertension, benign    Always has HTN when at the doctor's office.  . 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 Gout   . Hyperlipidemia   . Lynch syndrome   . Mild aortic sclerosis ([place])   . Mitral valve problem    Mildly thickened mitral valve  . Mitral valve prolapse    Mild, anterior  . MR (mitral regurgitation)    ECHO 08/10/09 shows mild MR again and normal EF. No significant changes from prior ECHO.  Marland Kitchen Palpitations    Occasional, but are not significant. ECHO 08/20/08 - Normal EF (60%), mildly thickened mitral valve with mild anterior mitral valve prolapse, mild MR, mild aortic sclerosis, grade 1 diastolic dysfunction.  . Proteinuria    Likely secondary to Diabetes    Past Surgical History:  Procedure Laterality Date  . ABDOMINAL HYSTERECTOMY    . APPLICATION OF CRANIAL NAVIGATION Right 07/07/2016   Procedure: APPLICATION OF CRANIAL NAVIGATION;  Surgeon: Consuella Lose, MD;  Location: [facility];  Service: Neurosurgery;  Laterality: Right;  . BREAST LUMPECTOMY Left   . CRANIOTOMY Right 07/07/2016   Procedure: CRANIOTOMY TUMOR  EXCISION w/BrainLab;  Surgeon: Consuella Lose, MD;  Location: [facility];  Service: Neurosurgery;  Laterality: Right;  . HEMORROIDECTOMY  2009  . TUBAL LIGATION      There were no vitals filed for this visit.             ADULT SLP TREATMENT - 08/26/16 1215      General Information   Behavior/Cognition Alert;Cooperative;Pleasant mood     Treatment Provided   Treatment provided Cognitive-Linquistic     Cognitive-Linquistic Treatment   Treatment focused on Cognition   Skilled Treatment Pt answered, "Sort of" when asked if she had corrected her homework prior to arriving today, as suggested yesterday. Pt was average 60% accurate on her homework targeting detailed language (attention to detail) and emergent awareness. SLP provided consistent mod cues faded to occasional min cues over the first 15 minutes. SLP then targeted attention and awareness with card sorting task in which pt was not double-checked even after SLP reminding pt of this just prior to task. Pt's accuracy 87%.      Assessment / Recommendations / Plan   Plan Continue with current plan of care     Progression Toward Goals   Progression toward goals Not progressing toward goals (comment)  attention and awareness appeared to hinder progress          SLP Education - 08/26/16 1350    Education provided  Yes   Education Details deficit areas, need to double check all answers   Person(s) Educated Patient;Spouse   Methods Explanation;Verbal cues   Comprehension Verbalized understanding;Verbal cues required          SLP Short Term Goals - 08/26/16 1425      SLP SHORT TERM GOAL #1   Title Pt will utilize external aids (notebook, timer) to manage schedule, lists, meds with rare min A over 2 sessions   Time 2   Period Weeks   Status On-going     SLP SHORT TERM GOAL #2   Title Pt will alternate attention between 2 simple cognitive lingusitic tasks with 90% on each and occasional min A   Time 2   Period Weeks    Status On-going     SLP SHORT TERM GOAL #3   Title Pt will verbalize 3 cognitive impairments over 2 sessions with rare min A   Time 2   Period Weeks   Status On-going     SLP SHORT TERM GOAL #4   Title Pt will perform mildly complex reasoning, functional math, attention to detail problems with 85% accuracy and occasional min A   Time 2   Period Weeks   Status On-going          SLP Long Term Goals - 08/24/16 1226      SLP LONG TERM GOAL #1   Title Pt will demonstrate anticiaptory awareness self correcting errors on cognitive tasks with occasional min A   Time 6   Period Weeks   Status On-going     SLP LONG TERM GOAL #2   Title Pt will divide attention between 2 simple cognitive lingusitic tasks with 85% on each and occasional min A   Time 6   Period Weeks   Status On-going     SLP LONG TERM GOAL #3   Title Pt will solve moderately complex reasoning, organizing and attention to detail problems with 90% accuracy and rare min A   Time 6   Period Weeks   Status On-going          Plan - 08/26/16 1426    Clinical Impression Statement Pt cont to present today with cognitive linguistic deficits. SLP assisted pt today with problem solving in developing a plan for troubleshooting difficulty wiht med administration, as well skilled ST for attention and awareness. Skilled ST remains necessary to improve pt independence.    Speech Therapy Frequency 2x / week   Duration --  8 weeks or 16 visis   Treatment/Interventions Compensatory strategies;Functional tasks;Cueing hierarchy;Patient/family education;Multimodal communcation approach;Internal/external aids;SLP instruction and feedback;Cognitive reorganization;Environmental controls   Potential to Achieve Goals Fair   Potential Considerations Co-morbidities   Consulted and Agree with Plan of Care Patient      Patient will benefit from skilled therapeutic intervention in order to improve the following deficits and impairments:    Cognitive communication deficit    Problem List Patient Active Problem List   Diagnosis Date Noted  . Elevated liver enzymes 08/23/2016  . Anxiety attack 08/16/2016  . Other constipation 08/09/2016  . Malignant frontal lobe tumor ([facility])   . Polyposis syndrome, familial 07/20/2016  . H/O: hysterectomy 07/20/2016  . Leukocytosis   . Monocytosis   . Acute lower UTI   . GBM (glioblastoma multiforme) ([facility])   . Glioblastoma multiforme of brain ([facility]) 07/11/2016  . MR (mitral regurgitation)   . Brain tumor ([facility])   . Apraxia   . Left-sided weakness   .  Steroid-induced hyperglycemia   . Leucocytosis   . Benign essential HTN   . History of gout   . Dysuria   . Acute respiratory failure with hypoxia ([place])   . Brain mass   . Seizure ([facility]) 06/30/2016  . Status epilepticus ([facility]) 06/30/2016    [place] [facility]  08/26/2016, 2:27 PM  [facility] [address] [place], [place], [address] Phone: [phone]   Fax:  [phone]   Name: Crystal Parsons MRN: [id number] Date of Birth: [birth year]

## 2016-08-26 NOTE — Progress Notes (Signed)
  Radiation Oncology         [phone]   Name: Crystal Parsons MRN: [id number]   Date: 08/26/2016  DOB: [birth year]   Weekly Radiation Therapy Management    ICD-9-CM ICD-10-CM   1. 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  Reports chronic knee pain continues. She wis working with PT to resolve her knee pain through exercise. Starting tomorrow the patient to taper down to dexamethasone 4 mg in the AM and 2 mg in the PM. No evidence of thrush noted by the nurse. Alopecia noted without desquamation. The patient reports Biafine is causing her forehead to itch. The nurse instructed the patient to begin using pure aloe instead of biafine. The nurse noted a rash on the patient's chest. Reports she had a similar rash years ago which her dermatologist managed with a steroid cream. The nurse explained that the chest rash is not related to the effects of radiation and she advised patient to follow up with her dermatologist. Reports an occasional headache in the morning which tylenol cures. Denies dizziness, nausea, emesis, diplopia, or tinnitus.   Set-up films were reviewed. The chart was checked.  Physical Findings  weight is 194 lb 6.4 oz (88.2 kg). Her blood pressure is 118/60 and her pulse is 87. Her respiration is 18 and oxygen saturation is 98%. . Weight essentially stable. Alert and in no acute distress. Minimal erythema and epilation in the radiation area.  Impression The patient is tolerating radiation.  Plan Continue treatment as planned. We discussed Optune as a treatment option post-radiation.      Sheral Apley Tammi Klippel, M.D.  This document serves as a record of services personally performed by Tyler Pita, MD. It was created on his behalf by Darcus Austin, a trained medical scribe. The creation of this record is based on the scribe's personal observations and the  provider's statements to them. This document has been checked and approved by the attending provider.

## 2016-08-26 NOTE — Patient Instructions (Signed)
Chair Sitting    Sit at edge of seat, spine straight, one leg extended. Put a hand on each thigh and bend forward from the hip, keeping spine straight. Allow hand on extended leg to reach toward toes. Support upper body with other arm. Hold __15_ seconds. Repeat __3_ times per session. Do _(as needed)__ sessions per day.  Copyright  VHI. All rights reserved.   Sit to Stand Transfers:  1. Scoot out to the edge of the chair 2. Place your feet flat on the floor, shoulder width apart.  Make sure your feet are tucked just under your knees. 3. Lean forward (nose over toes) with momentum, and stand up tall with your best posture.  If you need to use your arms, use them as a quick boost up to stand. 4. If you are in a low or soft chair, you can lean back and then forward up to stand, in order to get more momentum. 5. Once you are standing, make sure you are looking ahead and standing tall.  To sit down:  1. Back up until you feel the chair behind your legs. 2. Bend at you hips, reaching  Back for you chair, if needed, then slowly squat to sit down on your chair   Functional Quadriceps: Sit to Stand    Sit on edge of chair, feet flat on floor. Stand upright, extending knees fully. Repeat __10__ times per set. Do __2__ sets per session. Do _1___ sessions per day.  http://orth.exer.us/735   Copyright  VHI. All rights reserved.

## 2016-08-29 ENCOUNTER — Other Ambulatory Visit (HOSPITAL_BASED_OUTPATIENT_CLINIC_OR_DEPARTMENT_OTHER): Payer: Medicare Other

## 2016-08-29 ENCOUNTER — Ambulatory Visit
Admission: RE | Admit: 2016-08-29 | Discharge: 2016-08-29 | Disposition: A | Discharge status: Home / Self Care | Payer: Medicare Other | Source: Ambulatory Visit | Attending: Radiation Oncology | Admitting: Radiation Oncology

## 2016-08-29 DIAGNOSIS — C719 Malignant neoplasm of brain, unspecified: Secondary | ICD-10-CM | POA: Diagnosis not present

## 2016-08-29 DIAGNOSIS — C711 Malignant neoplasm of frontal lobe: Secondary | ICD-10-CM

## 2016-08-29 DIAGNOSIS — Z510 Encounter for antineoplastic radiation therapy: Secondary | ICD-10-CM | POA: Diagnosis not present

## 2016-08-29 LAB — COMPREHENSIVE METABOLIC PANEL
ALBUMIN: 3.6 g/dL (ref 3.5–5.0)
ALK PHOS: 69 U/L (ref 40–150)
ALT: 65 U/L — AB (ref 0–55)
ANION GAP: 15 meq/L — AB (ref 3–11)
AST: 23 U/L (ref 5–34)
BUN: 29.1 mg/dL — AB (ref 7.0–26.0)
CALCIUM: 9.4 mg/dL (ref 8.4–10.4)
CO2: 19 mEq/L — ABNORMAL LOW (ref 22–29)
CREATININE: 0.8 mg/dL (ref 0.6–1.1)
Chloride: 106 mEq/L (ref 98–109)
EGFR: 70 mL/min/{1.73_m2} — ABNORMAL LOW (ref >90)
Glucose: 139 mg/dl (ref 70–140)
POTASSIUM: 3.9 meq/L (ref 3.5–5.1)
Sodium: 140 mEq/L (ref 136–145)
Total Bilirubin: 0.47 mg/dL (ref 0.20–1.20)
Total Protein: 6.6 g/dL (ref 6.4–8.3)

## 2016-08-29 LAB — CBC WITH DIFFERENTIAL/PLATELET
BASO%: 0.3 % (ref 0.0–2.0)
BASOS ABS: 0 10*3/uL (ref 0.0–0.1)
EOS%: 0.1 % (ref 0.0–7.0)
Eosinophils Absolute: 0 10*3/uL (ref 0.0–0.5)
HCT: 38.1 % (ref 34.8–46.6)
HEMOGLOBIN: 12.6 g/dL (ref 11.6–15.9)
LYMPH#: 0.6 10*3/uL — AB (ref 0.9–3.3)
LYMPH%: 4.7 % — ABNORMAL LOW (ref 14.0–49.7)
MCH: 30.8 pg (ref 25.1–34.0)
MCHC: 33.1 g/dL (ref 31.5–36.0)
MCV: 93.1 fL (ref 79.5–101.0)
MONO#: 0.4 10*3/uL (ref 0.1–0.9)
MONO%: 3.2 % (ref 0.0–14.0)
NEUT#: 11.9 10*3/uL — ABNORMAL HIGH (ref 1.5–6.5)
NEUT%: 91.7 % — AB (ref 38.4–76.8)
PLATELETS: 241 10*3/uL (ref 145–400)
RBC: 4.09 10*6/uL (ref 3.70–5.45)
RDW: 15.5 % — AB (ref 11.2–14.5)
WBC: 12.9 10*3/uL — ABNORMAL HIGH (ref 3.9–10.3)

## 2016-08-29 LAB — TECHNOLOGIST REVIEW

## 2016-08-30 ENCOUNTER — Ambulatory Visit: Payer: Medicare Other | Admitting: Physical Therapy

## 2016-08-30 ENCOUNTER — Ambulatory Visit (HOSPITAL_BASED_OUTPATIENT_CLINIC_OR_DEPARTMENT_OTHER): Payer: Medicare Other | Admitting: Hematology and Oncology

## 2016-08-30 ENCOUNTER — Encounter: Payer: Self-pay | Admitting: Hematology and Oncology

## 2016-08-30 ENCOUNTER — Ambulatory Visit
Admission: RE | Admit: 2016-08-30 | Discharge: 2016-08-30 | Disposition: A | Discharge status: Home / Self Care | Payer: Medicare Other | Source: Ambulatory Visit | Attending: Radiation Oncology | Admitting: Radiation Oncology

## 2016-08-30 ENCOUNTER — Ambulatory Visit: Payer: Medicare Other

## 2016-08-30 ENCOUNTER — Ambulatory Visit: Payer: Medicare Other | Admitting: Occupational Therapy

## 2016-08-30 DIAGNOSIS — R41842 Visuospatial deficit: Secondary | ICD-10-CM | POA: Diagnosis not present

## 2016-08-30 DIAGNOSIS — C711 Malignant neoplasm of frontal lobe: Secondary | ICD-10-CM

## 2016-08-30 DIAGNOSIS — C719 Malignant neoplasm of brain, unspecified: Secondary | ICD-10-CM

## 2016-08-30 DIAGNOSIS — M6281 Muscle weakness (generalized): Secondary | ICD-10-CM

## 2016-08-30 DIAGNOSIS — R278 Other lack of coordination: Secondary | ICD-10-CM

## 2016-08-30 DIAGNOSIS — R21 Rash and other nonspecific skin eruption: Secondary | ICD-10-CM | POA: Diagnosis not present

## 2016-08-30 DIAGNOSIS — R748 Abnormal levels of other serum enzymes: Secondary | ICD-10-CM | POA: Diagnosis not present

## 2016-08-30 DIAGNOSIS — Z510 Encounter for antineoplastic radiation therapy: Secondary | ICD-10-CM | POA: Diagnosis not present

## 2016-08-30 DIAGNOSIS — G8194 Hemiplegia, unspecified affecting left nondominant side: Secondary | ICD-10-CM

## 2016-08-30 DIAGNOSIS — R41844 Frontal lobe and executive function deficit: Secondary | ICD-10-CM

## 2016-08-30 DIAGNOSIS — R2689 Other abnormalities of gait and mobility: Secondary | ICD-10-CM | POA: Diagnosis not present

## 2016-08-30 DIAGNOSIS — R41841 Cognitive communication deficit: Secondary | ICD-10-CM

## 2016-08-30 MED ORDER — DEXAMETHASONE 2 MG PO TABS: 2.0000 mg | ORAL_TABLET | Freq: Two times a day (BID) | ORAL | 1 refills | Status: DC

## 2016-08-30 NOTE — Patient Instructions (Signed)
Coordination Activities   Perform the following activities for 20  minutes 1  times per day with left hand(s).    Rotate ball in fingertips (clockwise and counter-clockwise).  Toss ball between hands.  Toss ball in air and catch with the same hand.  Flip cards 1 at a time as fast as you can.  Deal cards with your thumb (Hold deck in hand and push card off top with thumb).  Pick up coins and place in container or coin bank.  Pick up coins and stack.  Pick up coins one at a time until you get 5-10 in your hand, then move coins from palm to fingertips to stack one at a time.    Instructions

## 2016-08-30 NOTE — Assessment & Plan Note (Signed)
She has mildly elevated liver enzymes, likely due to medication side effects and possible fatty liver congestion from the excessive weight gain This is not significant I recommend we continue same treatment with Temodar without dosage adjustment

## 2016-08-30 NOTE — Progress Notes (Signed)
Crystal Parsons OFFICE PROGRESS NOTE  Patient Care Team: Lavone Orn, MD as PCP - General (Internal Medicine)  SUMMARY OF ONCOLOGIC HISTORY:   Glioblastoma multiforme of brain [facility]   06/30/2016 - 07/10/2016 Hospital Admission    The patient was admitted to the hospital after presentation with seizure. She was subsequently found to have brain tumor and underwent primary resection. She was subsequently discharged to rehabilitation facility      07/01/2016 Imaging    CT head showed mass lesion within the right frontal lobe measuring up to 4.8 cm. 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 However, a cerebral abscess may have a similar appearance. MRI with and without contrast is recommended for further characterization. 2. No significant mass effect or midline shift.  No hydrocephalus.      07/01/2016 Imaging    Ct chest showed right parahilar density appears to be atelectasis or possible infiltrate but no mass is identified. There is also streaky bibasilar subsegmental atelectasis and very small pleural effusions. 2. No mediastinal or hilar mass or adenopathy. Scattered lymph nodes are noted.      07/01/2016 Imaging    MRI brain showed Motion degraded examination. 3 x 4.3 x 4.5 cm complex RIGHT frontal lobe mass with imaging characteristics of primary brain tumor. 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 Local edema versus nonenhancing infiltrative tumor results in 2 mm RIGHT to LEFT midline shift. No ventricular entrapment.      07/07/2016 Imaging    Interval RIGHT craniotomy for resection of dominant RIGHT frontal lobe tumor without convincing evidence of residual local disease. Intraventricular extension of blood products without hydrocephalus. 2 mm RIGHT to LEFT midline shift without ventricular entrapment. Residual subcentimeter RIGHT frontal lobe satellite nodule consistent  with tumor.      07/07/2016 Surgery    Dr. Kathyrn Sheriff performed stereotactic right frontal craniotomy for resection of tumor       07/07/2016 Pathology Results    Accession: [id number] Brain, for tumor resection, Right Frontal Lobe - GLIOBLASTOMA MULTIFORME WHO GRADE IV/IV. - SEE ONCOLOGY TABLE BELOW. Microscopic Comment ONCOLOGY TABLE - BRAIN AND SPINAL CORD 1. Procedure: Resection 2. Tumor site, including laterality: Right frontal lobe 3. Maximum tumor size (cm): At least 4.8 cm (gross measurement) 4. Histologic type: Glioblastoma multiforme 5. Grade: WHO grade IV/IV 6. Margins (if applicable): Can not be assessed 7. Ancillary studies: Per protocol, a block will be sent for MGMT and IDH1/2 testing and MGMT was positive      08/02/2016 -  Radiation Therapy    She started concurrent chemo/RT      08/02/2016 -  Chemotherapy    She started concurrent chemo/RT       INTERVAL HISTORY: Please see below for problem oriented charting. She returns with her husband for further evaluation. She denies recent neurological deficit. No seizures or headache. She is doing well with physical therapy. Her mask has fit her better since recent steroid dose reduction  REVIEW OF SYSTEMS:   Constitutional: Denies fevers, chills or abnormal weight loss Eyes: Denies blurriness of vision Ears, nose, mouth, throat, and face: Denies mucositis or sore throat Respiratory: Denies cough, dyspnea or wheezes Cardiovascular: Denies palpitation, chest discomfort or lower extremity swelling Gastrointestinal:  Denies nausea, heartburn or change in bowel habits Skin: Denies abnormal skin rashes Lymphatics: Denies new lymphadenopathy or easy bruising Neurological:Denies numbness, tingling or new weaknesses Behavioral/Psych: Mood is stable, no  new changes  All other systems were reviewed with the patient and are negative.  I have reviewed the past medical history, past surgical history, social history and family  history with the patient and they are unchanged from previous note.  ALLERGIES:  is allergic to accupril [quinapril hcl]; latex; vitamin e; naproxen; and zoloft [sertraline hcl].  MEDICATIONS:  Current Outpatient Prescriptions  Medication Sig Dispense Refill  . Cholecalciferol (VITAMIN D) 2000 UNITS tablet Take 2,000 Units by mouth daily.    Marland Kitchen dexamethasone (DECADRON) 2 MG tablet Take 1 tablet (2 mg total) by mouth 2 (two) times daily. 60 tablet 1  . famotidine (PEPCID) 20 MG tablet Take 1 tablet (20 mg total) by mouth 2 (two) times daily. 60 tablet 11  . levETIRAcetam (KEPPRA) 1000 MG tablet Take 1 tablet (1,000 mg total) by mouth 2 (two) times daily. 60 tablet 0  . LORazepam (ATIVAN) 0.5 MG tablet Take 1 tablet (0.5 mg total) by mouth 2 (two) times daily as needed for anxiety. 60 tablet 0  . losartan-hydrochlorothiazide (HYZAAR) 50-12.5 MG tablet Take 1 tablet by mouth daily. 30 tablet 0  . Multiple Vitamin (MULTIVITAMIN) tablet Take 1 tablet by mouth daily.    . ondansetron (ZOFRAN) 8 MG tablet Take 1 tablet (8 mg total) by mouth every 8 (eight) hours as needed for nausea. 60 tablet 3  . senna-docusate (SENOKOT-S) 8.6-50 MG tablet Take 2 tablets by mouth at bedtime. 100 tablet 0  . sulfamethoxazole-trimethoprim (BACTRIM DS,SEPTRA DS) 800-160 MG tablet Take 1 tablet by mouth 3 (three) times a week. 60 tablet 11  . temozolomide (TEMODAR) 140 MG capsule Take 1 capsule (140 mg total) by mouth daily. May take on an empty stomach or at bedtime to decrease nausea & vomiting. 17 capsule 0   No current facility-administered medications for this visit.     PHYSICAL EXAMINATION: ECOG PERFORMANCE STATUS: 1 - Symptomatic but completely ambulatory  Vitals:   08/30/16 1400  BP: (!) 119/55  Pulse: 96  Resp: 17  Temp: 98.1 F (36.7 C)   Filed Weights   08/30/16 1400  Weight: 195 lb 6.4 oz (88.6 kg)    GENERAL:alert, no distress and comfortable. She looks mildly cushingoid SKIN: She has very  mild pain rash on her chest wall EYES: normal, Conjunctiva are pink and non-injected, sclera clear OROPHARYNX:no exudate, no erythema and lips, buccal mucosa, and tongue normal  Musculoskeletal:no cyanosis of digits and no clubbing  NEURO: alert & oriented x 3 with fluent speech, no focal motor/sensory deficits  LABORATORY DATA:  I have reviewed the data as listed    Component Value Date/Time   NA 140 08/29/2016 1228   K 3.9 08/29/2016 1228   CL 103 07/18/2016 0439   CO2 19 (L) 08/29/2016 1228   GLUCOSE 139 08/29/2016 1228   BUN 29.1 (H) 08/29/2016 1228   CREATININE 0.8 08/29/2016 1228   CALCIUM 9.4 08/29/2016 1228   PROT 6.6 08/29/2016 1228   ALBUMIN 3.6 08/29/2016 1228   AST 23 08/29/2016 1228   ALT 65 (H) 08/29/2016 1228   ALKPHOS 69 08/29/2016 1228   BILITOT 0.47 08/29/2016 1228   GFRNONAA >60 07/18/2016 0439   GFRAA >60 07/18/2016 0439    No results found for: SPEP, UPEP  Lab Results  Component Value Date   WBC 12.9 (H) 08/29/2016   NEUTROABS 11.9 (H) 08/29/2016   HGB 12.6 08/29/2016   HCT 38.1 08/29/2016   MCV 93.1 08/29/2016   PLT 241 08/29/2016  Chemistry      Component Value Date/Time   NA 140 08/29/2016 1228   K 3.9 08/29/2016 1228   CL 103 07/18/2016 0439   CO2 19 (L) 08/29/2016 1228   BUN 29.1 (H) 08/29/2016 1228   CREATININE 0.8 08/29/2016 1228      Component Value Date/Time   CALCIUM 9.4 08/29/2016 1228   ALKPHOS 69 08/29/2016 1228   AST 23 08/29/2016 1228   ALT 65 (H) 08/29/2016 1228   BILITOT 0.47 08/29/2016 1228       ASSESSMENT & PLAN:  Glioblastoma multiforme of brain ([place]) Since recent dexamethasone taper, she felt better. Her blood sugar is lower and she is able to fit in the mask. She had no signs and symptoms of steroid withdrawal I recommend we continue steroid taper to 2 mg twice a day starting Saturday. She will continue to take Zofran before treatment to prevent nausea She will continue antimicrobial prophylaxis with  Bactrim She will continue antiseizure medications I reinforced the importance of physical therapy and rehabilitation  Skin rash She has mild skin rash on her torso likely induced by steroids. Recommend close monitoring for now.  Elevated liver enzymes She has mildly elevated liver enzymes, likely due to medication side effects and possible fatty liver congestion from the excessive weight gain This is not significant I recommend we continue same treatment with Temodar without dosage adjustment   No orders of the defined types were placed in this encounter.  All questions were answered. The patient knows to call the clinic with any problems, questions or concerns. No barriers to learning was detected. I spent 15 minutes counseling the patient face to face. The total time spent in the appointment was 20 minutes and more than 50% was on counseling and review of test results     Heath Lark, MD 08/30/2016 2:51 PM

## 2016-08-30 NOTE — Patient Instructions (Signed)
In order to complete homework most efficiently (and avoid frustration):  Start the homework before lunch  Spread the homework out between therapy visits   HOMEWORK: Get a calendar Transfer your appointments to the new calendar

## 2016-08-30 NOTE — Therapy (Signed)
[facility] [address] [place], [place], [address] Phone: [phone]   Fax:  [phone]  Physical Therapy Treatment  Patient Details  Name: Crystal Parsons MRN: [id number] Date of Birth: Sep 28, 1945 Referring Provider: Dr. Posey Pronto  Encounter Date: 08/30/2016      PT End of Session - 08/30/16 0939    Visit Number 6   Date for PT Re-Evaluation 09/01/16   Authorization Type Medicare primary and BCBS secondary. G-CODE AND PROGRESS NOTE EVERY 10TH VISIT.    PT Start Time 0848   PT Stop Time 0928   PT Time Calculation (min) 40 min   Equipment Utilized During Treatment Gait belt   Activity Tolerance Patient tolerated treatment well   Behavior During Therapy WFL for tasks assessed/performed      Past Medical History:  Diagnosis Date  . Anxiety   . Bradycardia    At times with pulse in the 40s  . Brain cancer ([place])    Glioblastoma  . Diarrhea    With blating, improved with gluten-free diet  . Diastolic dysfunction   . Essential hypertension, benign    Always has HTN when at the doctor's office.  . Glioblastoma multiforme of brain ([place]) 07/11/2016  . Gout   . Hyperlipidemia   . Lynch syndrome   . Mild aortic sclerosis ([place])   . Mitral valve problem    Mildly thickened mitral valve  . Mitral valve prolapse    Mild, anterior  . MR (mitral regurgitation)    ECHO 08/10/09 shows mild MR again and normal EF. No significant changes from prior ECHO.  Marland Kitchen Palpitations    Occasional, but are not significant. ECHO 08/20/08 - Normal EF (60%), mildly thickened mitral valve with mild anterior mitral valve prolapse, mild MR, mild aortic sclerosis, grade 1 diastolic dysfunction.  . Proteinuria    Likely secondary to Diabetes    Past Surgical History:  Procedure Laterality Date  . ABDOMINAL HYSTERECTOMY    . APPLICATION OF CRANIAL NAVIGATION Right 07/07/2016   Procedure: APPLICATION OF CRANIAL NAVIGATION;  Surgeon: Consuella Lose, MD;  Location: [facility];  Service: Neurosurgery;  Laterality: Right;  . BREAST LUMPECTOMY Left   . CRANIOTOMY Right 07/07/2016   Procedure: CRANIOTOMY TUMOR EXCISION w/BrainLab;  Surgeon: Consuella Lose, MD;  Location: [facility];  Service: Neurosurgery;  Laterality: Right;  . HEMORROIDECTOMY  2009  . TUBAL LIGATION      There were no vitals filed for this visit.      Subjective Assessment - 08/30/16 0851    Subjective was very tired after going to radiation then walking through the grocery store.  doesn't like radiation mask   Pertinent History HTN, Lynch syndrome, gout, urinary incontinence, seizures, palpitations    Currently in Pain? No/denies            [facility] PT Assessment - 08/30/16 0858      Ambulation/Gait   Ambulation/Gait Assistance 7: Independent   Assistive device None   Gait Pattern Step-through pattern;Decreased stride length   Ambulation Surface Level;Indoor   Gait velocity 3.2 ft/sec  10.25 sec   Stairs Yes   Stairs Assistance 6: Modified independent (Device/Increase time)   Stair Management Technique One rail Right;Alternating pattern     6 Minute Walk- Baseline   6 Minute Walk- Baseline yes   BP (mmHg) 125/64   HR (bpm) 110   02 Sat (%RA) 98 %   Modified Borg Scale for Dyspnea 0- Nothing at all   Perceived  Rate of Exertion (Borg) 7- Very, very light     6 Minute walk- Post Test   6 Minute Walk Post Test yes   BP (mmHg) 118/78   HR (bpm) 119   02 Sat (%RA) 98 %   Modified Borg Scale for Dyspnea 2- Mild shortness of breath   Perceived Rate of Exertion (Borg) 11- Fairly light     6 minute walk test results    Aerobic Endurance Distance Walked 957   Endurance additional comments needed one rest break "I don't think it's because I can't, I think it's because I don't want to."     Functional Gait  Assessment   Gait assessed  Yes   Gait Level Surface Walks 20 ft in less than 7 sec but greater than 5.5 sec, uses assistive device, slower speed,  mild gait deviations, or deviates 6-10 in outside of the 12 in walkway width.   Change in Gait Speed Able to smoothly change walking speed without loss of balance or gait deviation. Deviate no more than 6 in outside of the 12 in walkway width.   Gait with Horizontal Head Turns Performs head turns smoothly with no change in gait. Deviates no more than 6 in outside 12 in walkway width   Gait with Vertical Head Turns Performs head turns with no change in gait. Deviates no more than 6 in outside 12 in walkway width.   Gait and Pivot Turn Pivot turns safely within 3 sec and stops quickly with no loss of balance.   Step Over Obstacle Is able to step over 2 stacked shoe boxes taped together (9 in total height) without changing gait speed. No evidence of imbalance.   Gait with Narrow Base of Support Ambulates 4-7 steps.   Gait with Eyes Closed Walks 20 ft, uses assistive device, slower speed, mild gait deviations, deviates 6-10 in outside 12 in walkway width. Ambulates 20 ft in less than 9 sec but greater than 7 sec.   Ambulating Backwards Walks 20 ft, uses assistive device, slower speed, mild gait deviations, deviates 6-10 in outside 12 in walkway width.   Steps Alternating feet, must use rail.   Total Score 24                             PT Education - 08/30/16 0937    Education provided Yes   Education Details initiated discussion on walking program and need to perform some exercise daily to at least maintain current function.  Pt with decr motivation (likely due to meds) so hard to exercise on her own   Person(s) Educated Patient;Spouse   Methods Explanation   Comprehension Verbalized understanding          PT Short Term Goals - 08/02/16 1334      PT SHORT TERM GOAL #1   Title same as LTGs            PT Long Term Goals - 08/30/16 0939      PT LONG TERM GOAL #1   Title Pt will be IND in HEP to improve endurance, strength, and balance. TARGET DATE FOR ALL LTGS:  08/30/16   Status On-going     PT LONG TERM GOAL #2   Title Pt will improve FGA score to >/=25/30 to decr. falls risk.    Baseline 08/30/16: 24/30 (improved from 20/30)   Status Not Met     PT LONG TERM GOAL #3  Title Pt will improve gait speed to >/=3.63f/sec., no AD, to amb. safely in the community.    Status Achieved     PT LONG TERM GOAL #4   Title Pt will amb. 1000' over even/uneven terrain, IND, in order to improve functional mobility.    Status Achieved     PT LONG TERM GOAL #5   Title Perform 6MWT and write goal as indicated.    Status Achieved     PT LONG TERM GOAL #6   Title improve 6MWT to > 1300' for improved activity and endurance.   Baseline 08/30/16: 957' likely due to fatigue from radiation   Status Not Met               Plan - 08/30/16 0940    Clinical Impression Statement Pt tolerated session well today as we began checking LTGs.  Discussed current POC and progress and in agreement with PT to d/c this week.  Pt and husband understand importance on continued exercise but at this time pt lacks motivation (likely due to medications) and fatigues quickly.  Will plan to address walking program next session to help with fatigue.     PT Treatment/Interventions ADLs/Self Care Home Management;Biofeedback;DME Instruction;Gait training;Stair training;Functional mobility training;Therapeutic activities;Therapeutic exercise;Balance training;Neuromuscular re-education;Manual techniques;Orthotic Fit/Training;Patient/family education;Vestibular   PT Next Visit Plan finish checking goals, d/c, g code; establish/review walking program   PT Home Exercise Plan sit to stand; walking program    Consulted and Agree with Plan of Care Family member/caregiver;Patient   Family Member Consulted spouse      Patient will benefit from skilled therapeutic intervention in order to improve the following deficits and impairments:  Abnormal gait, Decreased endurance, Decreased knowledge of  use of DME, Decreased balance, Decreased mobility, Decreased cognition, Impaired flexibility  Visit Diagnosis: Muscle weakness (generalized)  Other abnormalities of gait and mobility     Problem List Patient Active Problem List   Diagnosis Date Noted  . Elevated liver enzymes 08/23/2016  . Anxiety attack 08/16/2016  . Other constipation 08/09/2016  . Malignant frontal lobe tumor ([place]   . Polyposis syndrome, familial 07/20/2016  . H/O: hysterectomy 07/20/2016  . Leukocytosis   . Monocytosis   . Acute lower UTI   . GBM (glioblastoma multiforme) ([place]   . Glioblastoma multiforme of brain ([place] 07/11/2016  . MR (mitral regurgitation)   . Brain tumor ([place]   . Apraxia   . Left-sided weakness   . Steroid-induced hyperglycemia   . Leucocytosis   . Benign essential HTN   . History of gout   . Dysuria   . Acute respiratory failure with hypoxia ([place]   . Brain mass   . Seizure ([place] 06/30/2016  . Status epilepticus ([place] 06/30/2016       SLaureen Abrahams PT, DPT 08/30/16 9:44 AM    [facility] [address]Phone: [phone]  Fax:  [phone] Name: Crystal ESCALANTEMRN: [id number] of Birth: 811/16/1947

## 2016-08-30 NOTE — Therapy (Signed)
[facility] [address] [place], [place], [address] Phone: [phone]   Fax:  [phone]  Speech Language Pathology Treatment  Patient Details  Name: Crystal Parsons MRN: [id number] Date of Birth: Mar 29, 1946 Referring Provider: Dr. Delice Lesch  Encounter Date: 08/30/2016      End of Session - 08/30/16 1733    Visit Number 6   Number of Visits 17   Date for SLP Re-Evaluation 09/21/16   SLP Start Time 0936   SLP Stop Time  1019   SLP Time Calculation (min) 43 min   Activity Tolerance Patient tolerated treatment well      Past Medical History:  Diagnosis Date  . Anxiety   . Bradycardia    At times with pulse in the 40s  . Brain cancer ([place])    Glioblastoma  . Diarrhea    With blating, improved with gluten-free diet  . Diastolic dysfunction   . Essential hypertension, benign    Always has HTN when at the doctor's office.  . 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 Gout   . Hyperlipidemia   . Lynch syndrome   . Mild aortic sclerosis ([place])   . Mitral valve problem    Mildly thickened mitral valve  . Mitral valve prolapse    Mild, anterior  . MR (mitral regurgitation)    ECHO 08/10/09 shows mild MR again and normal EF. No significant changes from prior ECHO.  Marland Kitchen Palpitations    Occasional, but are not significant. ECHO 08/20/08 - Normal EF (60%), mildly thickened mitral valve with mild anterior mitral valve prolapse, mild MR, mild aortic sclerosis, grade 1 diastolic dysfunction.  . Proteinuria    Likely secondary to Diabetes    Past Surgical History:  Procedure Laterality Date  . ABDOMINAL HYSTERECTOMY    . APPLICATION OF CRANIAL NAVIGATION Right 07/07/2016   Procedure: APPLICATION OF CRANIAL NAVIGATION;  Surgeon: Consuella Lose, MD;  Location: [facility];  Service: Neurosurgery;  Laterality: Right;  . BREAST LUMPECTOMY Left   . CRANIOTOMY Right 07/07/2016   Procedure: CRANIOTOMY TUMOR  EXCISION w/BrainLab;  Surgeon: Consuella Lose, MD;  Location: [facility];  Service: Neurosurgery;  Laterality: Right;  . HEMORROIDECTOMY  2009  . TUBAL LIGATION      There were no vitals filed for this visit.      Subjective Assessment - 08/30/16 0942    Subjective Pt arrived with homework 80% completed.   Patient is accompained by: Family member  Jenny Reichmann (husband) and Tawny Asal (friend)   Currently in Pain? No/denies               ADULT SLP TREATMENT - 08/30/16 0946      General Information   Behavior/Cognition Alert;Cooperative;Pleasant mood     Treatment Provided   Treatment provided Cognitive-Linquistic     Cognitive-Linquistic Treatment   Treatment focused on Cognition   Skilled Treatment SLP faciliatated pt's cognitive improvement in the areas of memory, awareness, organization, and concentration by working with her how to problem solve her difficulty of not finishing her homework due to fatigue and frustration. Pt req'd mod-max A from SLP to problem solve and reason what steps she could take to reduce or eliminate frustration with homework. She expressed frustration also with not knowing when her appointments are. She problem solved with usual mod verbal cues from SLP. Husband was present and assisted in this process by answering what would be most beneficial and what pt's tendencies were prior to her chemo/surgery.  SLP instructed pt/husband that using and keeping a calendar may assist pt in less question asking re: schedules.     Assessment / Recommendations / Plan   Plan Continue with current plan of care     Progression Toward Goals   Progression toward goals Progressing toward goals          SLP Education - 08/30/16 1732    Education provided Yes   Education Details how to minimize frustration with homework and unable to recall schedule   Person(s) Educated Patient;Spouse   Methods Explanation;Demonstration;Verbal cues   Comprehension Verbalized  understanding;Verbal cues required;Need further instruction          SLP Short Term Goals - 08/30/16 1734      SLP SHORT TERM GOAL #1   Title Pt will utilize external aids (notebook, timer) to manage schedule, lists, meds with rare min A over 2 sessions   Time 1   Period Weeks   Status On-going     SLP SHORT TERM GOAL #2   Title Pt will selectively attend for 10 minutes in min noisy environment with a simple cognitive lingusitic task with 90% accuracy   Time 1   Period Weeks   Status Revised     SLP SHORT TERM GOAL #3   Title Pt will verbalize 3 cognitive impairments over 2 sessions with rare min A   Time 1   Period Weeks   Status On-going     SLP SHORT TERM GOAL #4   Title Pt will perform mildly complex reasoning, functional math, attention to detail problems with 85% accuracy and occasional min A   Time 1   Period Weeks   Status On-going          SLP Long Term Goals - 08/30/16 1735      SLP LONG TERM GOAL #1   Title Pt will demonstrate anticiaptory awareness self correcting errors on cognitive tasks with occasional min A   Time 5   Period Weeks   Status On-going     SLP LONG TERM GOAL #2   Title Pt will alternate attention between 2 simple cognitive lingusitic tasks with 85% on each and occasional min A   Time 5   Period Weeks   Status Revised     SLP LONG TERM GOAL #3   Title Pt will solve moderately complex reasoning, organizing and attention to detail problems with 90% accuracy and rare min A   Time 5   Period Weeks   Status On-going          Plan - 08/30/16 1733    Clinical Impression Statement Pt cont to present today with cognitive linguistic deficits. SLP assisted pt today with problem solving in developing a plan for troubleshooting difficulty with keeping up with appointments, and in completing homework. Skilled ST remains necessary to improve pt independence.    Speech Therapy Frequency 2x / week   Duration --  8 weeks or 16 visis    Treatment/Interventions Compensatory strategies;Functional tasks;Cueing hierarchy;Patient/family education;Multimodal communcation approach;Internal/external aids;SLP instruction and feedback;Cognitive reorganization;Environmental controls   Potential to Achieve Goals Fair   Potential Considerations Co-morbidities   Consulted and Agree with Plan of Care Patient      Patient will benefit from skilled therapeutic intervention in order to improve the following deficits and impairments:   Cognitive communication deficit    Problem List Patient Active Problem List   Diagnosis Date Noted  . Skin rash 08/30/2016  . Elevated liver enzymes 08/23/2016  .  Anxiety attack 08/16/2016  . Other constipation 08/09/2016  . 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 Polyposis syndrome, familial 07/20/2016  . H/O: hysterectomy 07/20/2016  . Leukocytosis   . Monocytosis   . Acute lower UTI   . GBM (glioblastoma multiforme) ([facility])   . Glioblastoma multiforme of brain ([facility]) 07/11/2016  . MR (mitral regurgitation)   . Brain tumor ([facility])   . Apraxia   . Left-sided weakness   . Steroid-induced hyperglycemia   . Leucocytosis   . Benign essential HTN   . History of gout   . Dysuria   . Acute respiratory failure with hypoxia ([facility])   . Brain mass   . Seizure ([facility]) 06/30/2016  . Status epilepticus ([place]) 06/30/2016    [facility] ,[place], [place]  08/30/2016, 5:36 PM  [facility] [address] [place], [place], [address] Phone: [phone]   Fax:  [phone]   Name: Crystal Parsons MRN: [id number] Date of Birth: 1946-05-07

## 2016-08-30 NOTE — Therapy (Signed)
[facility] [address] [place], [place], [address] Phone: [phone]   Fax:  [phone]  Occupational Therapy Treatment  Patient Details  Name: Crystal Parsons MRN: [id number] Date of Birth: 06-18-1946 Referring Provider: Dr. Delice Lesch  Encounter Date: 08/30/2016      OT End of Session - 08/30/16 1345    Visit Number 6   Number of Visits 16   Date for OT Re-Evaluation 09/27/16   Authorization Type Medicare - will need G code and PN every 10th visit   Authorization Time Period 60 days   Authorization - Visit Number 6   Authorization - Number of Visits 10   OT Start Time 1022   OT Stop Time 1104   OT Time Calculation (min) 42 min   Activity Tolerance Patient tolerated treatment well   Behavior During Therapy [facility] for tasks assessed/performed      Past Medical History:  Diagnosis Date  . Anxiety   . Bradycardia    At times with pulse in the 40s  . Brain cancer ([facility])    Glioblastoma  . Diarrhea    With blating, improved with gluten-free diet  . Diastolic dysfunction   . Essential hypertension, benign    Always has HTN when at the doctor's office.  . 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 Gout   . Hyperlipidemia   . Lynch syndrome   . Mild aortic sclerosis ([facility])   . Mitral valve problem    Mildly thickened mitral valve  . Mitral valve prolapse    Mild, anterior  . MR (mitral regurgitation)    ECHO 08/10/09 shows mild MR again and normal EF. No significant changes from prior ECHO.  Marland Kitchen Palpitations    Occasional, but are not significant. ECHO 08/20/08 - Normal EF (60%), mildly thickened mitral valve with mild anterior mitral valve prolapse, mild MR, mild aortic sclerosis, grade 1 diastolic dysfunction.  . Proteinuria    Likely secondary to Diabetes    Past Surgical History:  Procedure Laterality Date  . ABDOMINAL HYSTERECTOMY    . APPLICATION OF CRANIAL NAVIGATION Right 07/07/2016   Procedure: APPLICATION OF CRANIAL NAVIGATION;  Surgeon: Consuella Lose, MD;  Location: [facility];  Service: Neurosurgery;  Laterality: Right;  . BREAST LUMPECTOMY Left   . CRANIOTOMY Right 07/07/2016   Procedure: CRANIOTOMY TUMOR EXCISION w/BrainLab;  Surgeon: Consuella Lose, MD;  Location: [facility];  Service: Neurosurgery;  Laterality: Right;  . HEMORROIDECTOMY  2009  . TUBAL LIGATION      There were no vitals filed for this visit.      Subjective Assessment - 08/30/16 1025    Subjective  weak knees   Pertinent History see epic pt with R frontal mass x2 s/p crani starts radiation and chemo today;    Patient Stated Goals I want to feel like myself again   Currently in Pain? No/denies              Pt stood to unload dryer and fold clothes in standing, no LOB. Pt fatigued quickly. Therapist reviewed fine motor coordination HEP with pt/ husband, pt required mod v.c., husband verbalized understanding. See pt instructions. Copying small peg design with LUE, for cognitive component, min v.c.                  OT Short Term Goals - 08/24/16 1126      OT SHORT TERM GOAL #1   Title Pt and husband will be mod I  with HEP - 08/30/2106   Time 4   Period Weeks   Status On-going     OT SHORT TERM GOAL #2   Title Pt will decrease time on 9 hold peg with L hand by at least 4 seconds (baseline = 38.25) to assist with fine motor tasks   Time 4   Period Weeks   Status On-going     OT SHORT TERM GOAL #3   Title Pt will require supervision for simple, familiar hot meal prep   Time 4   Period Weeks   Status On-going     OT SHORT TERM GOAL #4   Title Pt will demonstate ability to follow 2-3 step instruction within context of functional, familar task.    Time 4   Period Weeks   Status On-going     OT SHORT TERM GOAL #5   Title Pt will identify at least 2 strategies for ST memory    Time 4   Period Weeks   Status On-going     OT SHORT TERM GOAL #6   Title Pt will  require no more than 2 vc's for selective attention during functional familar tasks in a busy environment.    Time 4   Period Weeks   Status On-going           OT Long Term Goals - 08/24/16 1126      OT LONG TERM GOAL #1   Title Pt and husband will be mod I with upgraded HEP prn - 09/27/2016   Time 8   Period Weeks   Status On-going     OT LONG TERM GOAL #2   Title Pt will decrease time on 9 hole peg test with L hand by at least 8 seconds (baseline=38.25) to assist with functional tasks.   Time 8   Period Weeks   Status On-going     OT LONG TERM GOAL #3   Title Pt will increase grip strength by 5 pounds to assist with functional use of L, non dominant hand (baseline= 30 pounds)   Time 8   Period Weeks   Status On-going     OT LONG TERM GOAL #4   Title Pt will be mod I with cooking 2 items simultaneously for altenating attention and safety with familiar hot meal prep.    Time 8   Period Weeks   Status On-going     OT LONG TERM GOAL #5   Title Pt will be mod I for laundry and simple home mgmt tasks.   Time 8   Period Weeks   Status On-going     OT LONG TERM GOAL #6   Title Pt will be able to set up med pill box with supervision only   Time 8   Period Weeks   Status On-going     OT LONG TERM GOAL #7   Title Pt will be mod I to attend to L during functional tasks.    Time 8   Period Weeks   Status On-going               Plan - 08/30/16 1343    Clinical Impression Statement Pt is progressing towards goals for IADLs.   Rehab Potential Good   OT Frequency 2x / week   OT Duration 8 weeks   OT Treatment/Interventions Self-care/ADL training;DME and/or AE instruction;Neuromuscular education;Therapeutic exercise;Therapeutic activities;Patient/family education;Visual/perceptual remediation/compensation;Cognitive remediation/compensation   Plan check short term goals   Consulted and Agree with Plan of  Care Patient;Family member/caregiver      Patient will  benefit from skilled therapeutic intervention in order to improve the following deficits and impairments:  Decreased activity tolerance, Decreased coordination, Decreased cognition, Decreased safety awareness, Decreased strength, Impaired UE functional use, Impaired vision/preception  Visit Diagnosis: Muscle weakness (generalized)  Other abnormalities of gait and mobility  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 Elevated liver enzymes 08/23/2016  . Anxiety attack 08/16/2016  . Other constipation 08/09/2016  . 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 Polyposis syndrome, familial 07/20/2016  . H/O: hysterectomy 07/20/2016  . Leukocytosis   . Monocytosis   . Acute lower UTI   . GBM (glioblastoma multiforme) ([facility])   . 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 MR (mitral regurgitation)   . Brain tumor ([facility])   . Apraxia   . Left-sided weakness   . Steroid-induced hyperglycemia   . Leucocytosis   . Benign essential HTN   . History of gout   . Dysuria   . Acute respiratory failure with hypoxia ([facility])   . Brain mass   . Seizure ([facility]) 06/30/2016  . Status epilepticus ([facility]) 06/30/2016    RINE,KATHRYN 08/30/2016, 1:46 PM  [facility] [address] [place], [place], [address] Phone: [phone]   Fax:  [phone]  Name: Crystal Parsons MRN: [id number] Date of Birth: Jul 03, 1946

## 2016-08-30 NOTE — Assessment & Plan Note (Signed)
Since recent dexamethasone taper, she felt better. Her blood sugar is lower and she is able to fit in the mask. She had no signs and symptoms of steroid withdrawal I recommend we continue steroid taper to 2 mg twice a day starting Saturday. She will continue to take Zofran before treatment to prevent nausea She will continue antimicrobial prophylaxis with Bactrim She will continue antiseizure medications I reinforced the importance of physical therapy and rehabilitation

## 2016-08-30 NOTE — Assessment & Plan Note (Signed)
She has mild skin rash on her torso likely induced by steroids. Recommend close monitoring for now.

## 2016-08-31 ENCOUNTER — Ambulatory Visit: Payer: Medicare Other | Admitting: Physical Therapy

## 2016-08-31 ENCOUNTER — Ambulatory Visit: Payer: Medicare Other

## 2016-08-31 ENCOUNTER — Ambulatory Visit
Admission: RE | Admit: 2016-08-31 | Discharge: 2016-08-31 | Disposition: A | Discharge status: Home / Self Care | Payer: Medicare Other | Source: Ambulatory Visit | Attending: Radiation Oncology | Admitting: Radiation Oncology

## 2016-08-31 ENCOUNTER — Encounter: Payer: Self-pay | Admitting: Physical Therapy

## 2016-08-31 ENCOUNTER — Ambulatory Visit: Payer: Medicare Other | Admitting: Occupational Therapy

## 2016-08-31 DIAGNOSIS — G8194 Hemiplegia, unspecified affecting left nondominant side: Secondary | ICD-10-CM | POA: Diagnosis not present

## 2016-08-31 DIAGNOSIS — R41844 Frontal lobe and executive function deficit: Secondary | ICD-10-CM | POA: Diagnosis not present

## 2016-08-31 DIAGNOSIS — M6281 Muscle weakness (generalized): Secondary | ICD-10-CM

## 2016-08-31 DIAGNOSIS — R2689 Other abnormalities of gait and mobility: Secondary | ICD-10-CM

## 2016-08-31 DIAGNOSIS — R41842 Visuospatial deficit: Secondary | ICD-10-CM

## 2016-08-31 DIAGNOSIS — R278 Other lack of coordination: Secondary | ICD-10-CM

## 2016-08-31 DIAGNOSIS — Z510 Encounter for antineoplastic radiation therapy: Secondary | ICD-10-CM | POA: Diagnosis not present

## 2016-08-31 DIAGNOSIS — R41841 Cognitive communication deficit: Secondary | ICD-10-CM

## 2016-08-31 DIAGNOSIS — C719 Malignant neoplasm of brain, unspecified: Secondary | ICD-10-CM | POA: Diagnosis not present

## 2016-08-31 DIAGNOSIS — C711 Malignant neoplasm of frontal lobe: Secondary | ICD-10-CM | POA: Diagnosis not present

## 2016-08-31 NOTE — Patient Instructions (Signed)
  Please complete the assigned speech therapy homework and return it to your next session.  

## 2016-08-31 NOTE — Therapy (Signed)
[facility] [address] [place], [place], [address] Phone: [phone]   Fax:  [phone]  Speech Language Pathology Treatment  Patient Details  Name: Crystal Parsons MRN: [id number] Date of Birth: November 21, 1945 Referring Provider: Dr. Delice Lesch  Encounter Date: 08/31/2016      End of Session - 08/31/16 1047    Visit Number 7   Number of Visits 17   Date for SLP Re-Evaluation 09/21/16   SLP Start Time 0935   SLP Stop Time  1017   SLP Time Calculation (min) 42 min   Activity Tolerance Patient tolerated treatment well      Past Medical History:  Diagnosis Date  . Anxiety   . Bradycardia    At times with pulse in the 40s  . Brain cancer ([place] [facility])    Glioblastoma  . Diarrhea    With blating, improved with gluten-free diet  . Diastolic dysfunction   . Essential hypertension, benign    Always has HTN when at the doctor's office.  . 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 Gout   . Hyperlipidemia   . Lynch syndrome   . Mild aortic sclerosis ([facility])   . Mitral valve problem    Mildly thickened mitral valve  . Mitral valve prolapse    Mild, anterior  . MR (mitral regurgitation)    ECHO 08/10/09 shows mild MR again and normal EF. No significant changes from prior ECHO.  Marland Kitchen Palpitations    Occasional, but are not significant. ECHO 08/20/08 - Normal EF (60%), mildly thickened mitral valve with mild anterior mitral valve prolapse, mild MR, mild aortic sclerosis, grade 1 diastolic dysfunction.  . Proteinuria    Likely secondary to Diabetes    Past Surgical History:  Procedure Laterality Date  . ABDOMINAL HYSTERECTOMY    . APPLICATION OF CRANIAL NAVIGATION Right 07/07/2016   Procedure: APPLICATION OF CRANIAL NAVIGATION;  Surgeon: Consuella Lose, MD;  Location: [facility];  Service: Neurosurgery;  Laterality: Right;  . BREAST LUMPECTOMY Left   . CRANIOTOMY Right 07/07/2016   Procedure: CRANIOTOMY TUMOR  EXCISION w/BrainLab;  Surgeon: Consuella Lose, MD;  Location: [facility];  Service: Neurosurgery;  Laterality: Right;  . HEMORROIDECTOMY  2009  . TUBAL LIGATION      There were no vitals filed for this visit.      Subjective Assessment - 08/31/16 1038    Subjective "I only got about a week done."   Patient is accompained by: --  husband               ADULT SLP TREATMENT - 08/31/16 1038      General Information   Behavior/Cognition Alert;Cooperative;Pleasant mood     Treatment Provided   Treatment provided Cognitive-Linquistic     Pain Assessment   Pain Assessment No/denies pain     Cognitive-Linquistic Treatment   Treatment focused on Cognition   Skilled Treatment Pt's sustained, selective and alternating attention were targeted today in a word transfer task. Extra time necessary, and pt req'd SLP min cues occasionally for sustained/selective attention (internal distraction), and min cues usually for re-initiaiton of task after SLP comment/question (alternating attention). Pt's husband stated he has seen the same attentional deficits at home and in the car.     Assessment / Recommendations / Plan   Plan Continue with current plan of care     Progression Toward Goals   Progression toward goals Progressing toward goals  SLP Education - 08/30/16 1732    Education provided Yes   Education Details how to minimize frustration with homework and unable to recall schedule   Person(s) Educated Patient;Spouse   Methods Explanation;Demonstration;Verbal cues   Comprehension Verbalized understanding;Verbal cues required;Need further instruction          SLP Short Term Goals - 08/31/16 1048      SLP SHORT TERM GOAL #1   Title Pt will utilize external aids (notebook, timer) to manage schedule, lists, meds with rare min A over 2 sessions   Time 1   Period Weeks   Status Not Met     SLP SHORT TERM GOAL #2   Title Pt will selectively attend for 10 minutes in min  noisy environment with a simple cognitive lingusitic task with 90% accuracy   Time 1   Period Weeks   Status Not Met     SLP SHORT TERM GOAL #3   Title Pt will verbalize 3 cognitive impairments over 2 sessions with rare min A   Time 1   Period Weeks   Status Not Met     SLP SHORT TERM GOAL #4   Title Pt will perform mildly complex reasoning, functional math, attention to detail problems with 85% accuracy and occasional min A   Time 1   Period Weeks   Status Not Met          SLP Long Term Goals - 08/31/16 1048      SLP LONG TERM GOAL #1   Title Pt will demonstrate anticiaptory awareness self correcting errors on cognitive tasks with occasional min A   Time 5   Period Weeks   Status On-going     SLP LONG TERM GOAL #2   Title Pt will alternate attention between 2 simple cognitive lingusitic tasks with 85% on each and occasional min A   Time 5   Period Weeks   Status Revised     SLP LONG TERM GOAL #3   Title Pt will solve min-moderately complex reasoning, organizing and attention to detail problems with 90% accuracy and rare min A   Time 5   Period Weeks   Status Revised          Plan - 08/31/16 1047    Clinical Impression Statement Pt cont to present today with cognitive linguistic deficits, cues occasionally-usually needed for attention skills. Skilled ST remains necessary to improve pt independence.    Speech Therapy Frequency 2x / week   Duration --  8 weeks or 16 visis   Treatment/Interventions Compensatory strategies;Functional tasks;Cueing hierarchy;Patient/family education;Multimodal communcation approach;Internal/external aids;SLP instruction and feedback;Cognitive reorganization;Environmental controls   Potential to Achieve Goals Fair   Potential Considerations Co-morbidities   Consulted and Agree with Plan of Care Patient      Patient will benefit from skilled therapeutic intervention in order to improve the following deficits and impairments:    Cognitive communication deficit    Problem List Patient Active Problem List   Diagnosis Date Noted  . Skin rash 08/30/2016  . Elevated liver enzymes 08/23/2016  . Anxiety attack 08/16/2016  . Other constipation 08/09/2016  . Malignant frontal lobe tumor ([place])   . Polyposis syndrome, familial 07/20/2016  . H/O: hysterectomy 07/20/2016  . Leukocytosis   . Monocytosis   . Acute lower UTI   . GBM (glioblastoma multiforme) ([place])   . Glioblastoma multiforme of brain ([place]) 07/11/2016  . MR (mitral regurgitation)   . Brain tumor ([place])   . Apraxia   .  Left-sided weakness   . Steroid-induced hyperglycemia   . Leucocytosis   . Benign essential HTN   . History of gout   . Dysuria   . Acute respiratory failure with hypoxia ([place])   . Brain mass   . Seizure ([facility]) 06/30/2016  . Status epilepticus ([place]) 06/30/2016    SCHINKE,CARL ,[place], [place]  08/31/2016, 10:49 AM  [facility] [address] [place], [place], [address] Phone: [phone]   Fax:  [phone]   Name: Crystal Parsons MRN: [id number] Date of Birth: 1946/06/10

## 2016-08-31 NOTE — Patient Instructions (Signed)

## 2016-08-31 NOTE — Therapy (Signed)
[facility] [address] [place], [place], [address] Phone: [phone]   Fax:  [phone]  Physical Therapy Treatment  Patient Details  Name: TAMURA LASKY MRN: [id number] Date of Birth: [birth year] Referring Provider: Dr. Posey Pronto  Encounter Date: 08/31/2016      PT End of Session - 08/31/16 1244    Visit Number 7   Date for PT Re-Evaluation 09/01/16   Authorization Type Medicare primary and BCBS secondary. G-CODE AND PROGRESS NOTE EVERY 10TH VISIT.    PT Start Time 1016   PT Stop Time 1105   PT Time Calculation (min) 49 min   Equipment Utilized During Treatment Gait belt   Activity Tolerance Patient tolerated treatment well   Behavior During Therapy WFL for tasks assessed/performed      Past Medical History:  Diagnosis Date  . Anxiety   . Bradycardia    At times with pulse in the 40s  . Brain cancer ([place])    Glioblastoma  . Diarrhea    With blating, improved with gluten-free diet  . Diastolic dysfunction   . Essential hypertension, benign    Always has HTN when at the doctor's office.  . 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 Gout   . Hyperlipidemia   . Lynch syndrome   . Mild aortic sclerosis ([place])   . Mitral valve problem    Mildly thickened mitral valve  . Mitral valve prolapse    Mild, anterior  . MR (mitral regurgitation)    ECHO 08/10/09 shows mild MR again and normal EF. No significant changes from prior ECHO.  Marland Kitchen Palpitations    Occasional, but are not significant. ECHO 08/20/08 - Normal EF (60%), mildly thickened mitral valve with mild anterior mitral valve prolapse, mild MR, mild aortic sclerosis, grade 1 diastolic dysfunction.  . Proteinuria    Likely secondary to Diabetes    Past Surgical History:  Procedure Laterality Date  . ABDOMINAL HYSTERECTOMY    . APPLICATION OF CRANIAL NAVIGATION Right 07/07/2016   Procedure: APPLICATION OF CRANIAL NAVIGATION;  Surgeon: Consuella Lose, MD;  Location: [facility];  Service: Neurosurgery;  Laterality: Right;  . BREAST LUMPECTOMY Left   . CRANIOTOMY Right 07/07/2016   Procedure: CRANIOTOMY TUMOR EXCISION w/BrainLab;  Surgeon: Consuella Lose, MD;  Location: [facility];  Service: Neurosurgery;  Laterality: Right;  . HEMORROIDECTOMY  2009  . TUBAL LIGATION      There were no vitals filed for this visit.      Subjective Assessment - 08/31/16 1241    Subjective Patient reports she was surprised last visit that PT was discharging today. She does report improved balance but has fatigue issues. She reports even when she is fatigued walking out of grocery store without cart or support, she had no balance only.    Patient is accompained by: Family member   Pertinent History HTN, Lynch syndrome, gout, urinary incontinence, seizures, palpitations    Currently in Pain? No/denies                  Walking Program: walk 5 min rest 5 min 3 sets, 3-5 x/wk. Once not fatigued with 30 min (15 min work) total of 3 sets, then increase to 62mn walk, 4 min rest. And so on until able to ambulate 25-30 min. If shopping with husband, ambulate pushing shopping cart 4 aisles (while husband uses power cart), then ride in power cart 2 aisles. Switch back & forth while shopping. Increase aisle walking when  2 shopping trips does not result in fatigue.  Pt and husband verbalized understanding.         Balance Exercises - 08/31/16 1015      OTAGO PROGRAM   Head Movements Sitting;5 reps   Neck Movements Sitting;5 reps   Back Extension Standing;5 reps   Trunk Movements Standing;5 reps   Ankle Movements Sitting;10 reps   Knee Extensor 10 reps   Knee Flexor 10 reps  UE support   Hip ABductor 10 reps  UE support   Ankle Plantorflexors 20 reps, support   Ankle Dorsiflexors 20 reps, support   Knee Bends 10 reps, support  over chair for safety   Backwards Walking Support   Walking and Turning Around No assistive device   Sideways  Walking No assistive device  near counter   Tandem Stance 10 seconds, no support  uses counter to get into position   Tandem Walk Support   One Leg Stand 10 seconds, no support  near counter   Heel Walking Support   Toe Walk Support   Heel Toe Walking Backward No support  counter   Sit to Stand 10 reps, no support           PT Education - 08/31/16 1015    Education provided Yes   Education Details OTAGO & WALKING HEP   Person(s) Educated Patient;Spouse   Methods Explanation;Demonstration;Verbal cues;Handout   Comprehension Verbalized understanding          PT Short Term Goals - 08/02/16 1334      PT SHORT TERM GOAL #1   Title same as LTGs            PT Long Term Goals - 08/31/16 1245      PT LONG TERM GOAL #1   Title Pt will be IND in HEP to improve endurance, strength, and balance. TARGET DATE FOR ALL LTGS: 08/30/16   Baseline MET 08/31/2016   Status Achieved     PT LONG TERM GOAL #2   Title Pt will improve FGA score to >/=25/30 to decr. falls risk.    Baseline 08/30/16: 24/30 (improved from 20/30)   Status Not Met     PT LONG TERM GOAL #3   Title Pt will improve gait speed to >/=3.77f/sec., no AD, to amb. safely in the community.    Status Achieved     PT LONG TERM GOAL #4   Title Pt will amb. 1000' over even/uneven terrain, IND, in order to improve functional mobility.    Status Achieved     PT LONG TERM GOAL #5   Title Perform 6MWT and write goal as indicated.    Status Achieved     PT LONG TERM GOAL #6   Title improve 6MWT to > 1300' for improved activity and endurance.   Baseline 08/30/16: 957' likely due to fatigue from radiation   Status Not Met               Plan - 08/31/16 1247    Clinical Impression Statement Patient and husband appear to understand HEP of OTAGO & walking. Patient appears ready for discharge from PT at this time per plan of care.    PT Next Visit Plan discharge PT   Consulted and Agree with Plan of Care  Patient;Family member/caregiver   Family Member Consulted husband      Patient will benefit from skilled therapeutic intervention in order to improve the following deficits and impairments:     Visit Diagnosis: Muscle  weakness (generalized)  Other abnormalities of gait and mobility       G-Codes - 2016/09/26 1249    Functional Assessment Tool Used FGA 24/30 and gait speed no AD: 3.2 ft/sec   Functional Limitation Mobility: Walking and moving around   Mobility: Walking and Moving Around Goal Status [phone]) At least 1 percent but less than 20 percent impaired, limited or restricted   Mobility: Walking and Moving Around Discharge Status [phone]) At least 1 percent but less than 20 percent impaired, limited or restricted      Problem List Patient Active Problem List   Diagnosis Date Noted  . Skin rash 08/30/2016  . Elevated liver enzymes 08/23/2016  . Anxiety attack 08/16/2016  . Other constipation 08/09/2016  . 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 Polyposis syndrome, familial 07/20/2016  . H/O: hysterectomy 07/20/2016  . Leukocytosis   . Monocytosis   . Acute lower UTI   . GBM (glioblastoma multiforme) ([facility])   . Glioblastoma multiforme of brain ([place]) 07/11/2016  . MR (mitral regurgitation)   . Brain tumor ([place])   . Apraxia   . Left-sided weakness   . Steroid-induced hyperglycemia   . Leucocytosis   . Benign essential HTN   . History of gout   . Dysuria   . Acute respiratory failure with hypoxia ([facility])   . Brain mass   . Seizure ([facility]) 06/30/2016  . Status epilepticus ([place]) 06/30/2016    PHYSICAL THERAPY DISCHARGE SUMMARY  Visits from Start of Care: 7  Current functional level related to goals / functional outcomes: See above   Remaining deficits: See above   Education / Equipment: HEP including walking program  Plan: Patient agrees to discharge.  Patient goals were partially met. Patient is being discharged due to meeting the stated rehab goals.  ?????          Arush Gatliff PT, DPT 2016/09/26, 1:00 PM  [facility] [address] [place], [place], [address] Phone: [phone]   Fax:  [phone]  Name: SYA NESTLER MRN: [id number] Date of Birth: 12-05-1945

## 2016-08-31 NOTE — Therapy (Signed)
[facility] [address] [place], [place], [address] Phone: [phone]   Fax:  [phone]  Occupational Therapy Treatment  Patient Details  Name: Crystal Parsons MRN: [id number] Date of Birth: 06/17/1946 Referring Provider: Dr. Delice Lesch  Encounter Date: 08/31/2016      OT End of Session - 08/31/16 1223    Visit Number 7   Number of Visits 16   Date for OT Re-Evaluation 09/27/16   Authorization Type Medicare - will need G code and PN every 10th visit   Authorization Time Period 60 days   Authorization - Visit Number 7   Authorization - Number of Visits 10   OT Start Time 1107   OT Stop Time 1149   OT Time Calculation (min) 42 min   Activity Tolerance Patient tolerated treatment well   Behavior During Therapy [facility] for tasks assessed/performed      Past Medical History:  Diagnosis Date  . Anxiety   . Bradycardia    At times with pulse in the 40s  . Brain cancer ([place])    Glioblastoma  . Diarrhea    With blating, improved with gluten-free diet  . Diastolic dysfunction   . Essential hypertension, benign    Always has HTN when at the doctor's office.  . 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 Gout   . Hyperlipidemia   . Lynch syndrome   . Mild aortic sclerosis ([place])   . Mitral valve problem    Mildly thickened mitral valve  . Mitral valve prolapse    Mild, anterior  . MR (mitral regurgitation)    ECHO 08/10/09 shows mild MR again and normal EF. No significant changes from prior ECHO.  Marland Kitchen Palpitations    Occasional, but are not significant. ECHO 08/20/08 - Normal EF (60%), mildly thickened mitral valve with mild anterior mitral valve prolapse, mild MR, mild aortic sclerosis, grade 1 diastolic dysfunction.  . Proteinuria    Likely secondary to Diabetes    Past Surgical History:  Procedure Laterality Date  . ABDOMINAL HYSTERECTOMY    . APPLICATION OF CRANIAL NAVIGATION Right 07/07/2016    Procedure: APPLICATION OF CRANIAL NAVIGATION;  Surgeon: Consuella Lose, MD;  Location: [facility];  Service: Neurosurgery;  Laterality: Right;  . BREAST LUMPECTOMY Left   . CRANIOTOMY Right 07/07/2016   Procedure: CRANIOTOMY TUMOR EXCISION w/BrainLab;  Surgeon: Consuella Lose, MD;  Location: [facility];  Service: Neurosurgery;  Laterality: Right;  . HEMORROIDECTOMY  2009  . TUBAL LIGATION      There were no vitals filed for this visit.      Subjective Assessment - 08/31/16 1216    Patient Stated Goals I want to feel like myself again   Currently in Pain? No/denies            Therapist started checking progress towards goals. See short term goals. Pipe tree design trial 1 with 2 error, following trial 1 therapist had pt organize pieces by size, pt demonstrated improved ability to locate correct size piece, hoever pt required min v.c. For correct design as she lost he place. Therapist recommended pt organizes functional tasks at home if cooking with supervision. Arm bike x 6 mins level 1 for conditioning, mod v.c. To maintain speed. HR 97 bpm, O2 sats 98%                 OT Education - 08/31/16 1228    Education provided Yes   Education Details Memory compensation strategies,  organization for functional activities.   Person(s) Educated Spouse   Methods Explanation;Demonstration;Verbal cues;Handout   Comprehension Verbalized understanding;Returned demonstration;Verbal cues required          OT Short Term Goals - 08/31/16 1109      OT SHORT TERM GOAL #1   Title Pt and husband will be mod I with HEP - 08/30/2106   Time 4   Period Weeks   Status Achieved     OT SHORT TERM GOAL #2   Title Pt will decrease time on 9 hold peg with L hand by at least 4 seconds (baseline = 38.25) to assist with fine motor tasks   Time 4   Period Weeks   Status Achieved  30.41 secs      OT SHORT TERM GOAL #3   Title Pt will require supervision for simple, familiar hot meal prep    Time 4   Period Weeks   Status Achieved     OT SHORT TERM GOAL #4   Title Pt will demonstate ability to follow 2-3 step instruction within context of functional, familar task.    Time 4   Period Weeks   Status On-going     OT SHORT TERM GOAL #5   Title Pt will identify at least 2 strategies for ST memory    Time 4   Period Weeks   Status On-going  ST memory strategies discussed will reinforce     OT SHORT TERM GOAL #6   Title Pt will require no more than 2 vc's for selective attention during functional familar tasks in a busy environment.    Time 4   Period Weeks   Status Achieved           OT Long Term Goals - 08/24/16 1126      OT LONG TERM GOAL #1   Title Pt and husband will be mod I with upgraded HEP prn - 09/27/2016   Time 8   Period Weeks   Status On-going     OT LONG TERM GOAL #2   Title Pt will decrease time on 9 hole peg test with L hand by at least 8 seconds (baseline=38.25) to assist with functional tasks.   Time 8   Period Weeks   Status On-going     OT LONG TERM GOAL #3   Title Pt will increase grip strength by 5 pounds to assist with functional use of L, non dominant hand (baseline= 30 pounds)   Time 8   Period Weeks   Status On-going     OT LONG TERM GOAL #4   Title Pt will be mod I with cooking 2 items simultaneously for altenating attention and safety with familiar hot meal prep.    Time 8   Period Weeks   Status On-going     OT LONG TERM GOAL #5   Title Pt will be mod I for laundry and simple home mgmt tasks.   Time 8   Period Weeks   Status On-going     OT LONG TERM GOAL #6   Title Pt will be able to set up med pill box with supervision only   Time 8   Period Weeks   Status On-going     OT LONG TERM GOAL #7   Title Pt will be mod I to attend to L during functional tasks.    Time 8   Period Weeks   Status On-going  Plan - 08/31/16 1218    Clinical Impression Statement Pt is progressing towards goals. She  demonstrates improved fine motor coordination.   Rehab Potential Good   OT Frequency 2x / week   OT Duration 8 weeks   OT Treatment/Interventions Self-care/ADL training;DME and/or AE instruction;Neuromuscular education;Therapeutic exercise;Therapeutic activities;Patient/family education;Visual/perceptual remediation/compensation;Cognitive remediation/compensation   Plan continue to work towards unmet goals   Consulted and Agree with Plan of Care Patient;Family member/caregiver   Family Member Consulted husband John      Patient will benefit from skilled therapeutic intervention in order to improve the following deficits and impairments:  Decreased activity tolerance, Decreased coordination, Decreased cognition, Decreased safety awareness, Decreased strength, Impaired UE functional use, Impaired vision/preception  Visit Diagnosis: Muscle weakness (generalized)  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 Skin rash 08/30/2016  . Elevated liver enzymes 08/23/2016  . Anxiety attack 08/16/2016  . Other constipation 08/09/2016  . 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 Polyposis syndrome, familial 07/20/2016  . H/O: hysterectomy 07/20/2016  . Leukocytosis   . Monocytosis   . Acute lower UTI   . GBM (glioblastoma multiforme) ([place])   . Glioblastoma multiforme of brain ([facility] [place]) 07/11/2016  . MR (mitral regurgitation)   . Brain tumor ([place])   . Apraxia   . Left-sided weakness   . Steroid-induced hyperglycemia   . Leucocytosis   . Benign essential HTN   . History of gout   . Dysuria   . Acute respiratory failure with hypoxia ([place])   . Brain mass   . Seizure ([place]) 06/30/2016  . Status epilepticus ([place]) 06/30/2016    RINE,KATHRYN 08/31/2016, 12:29  PM  [facility] [address] [place], [place], [address] Phone: [phone]   Fax:  [phone]  Name: Crystal Parsons MRN: [id number] Date of Birth: June 26, 1946

## 2016-08-31 NOTE — Patient Instructions (Signed)
Walking Program: walk 5 min rest 5 min 3 sets, 3-5 x/wk. Once not fatigued with 30 min (15 min work) total of 3 sets, then increase to 11min walk, 4 min rest. And so on until able to ambulate 25-30 min. If shopping with husband, ambulate pushing shopping cart 4 aisles (while husband uses power cart), then ride in power cart 2 aisles. Switch back & forth while shopping. Increase aisle walking when 2 shopping trips does not result in fatigue.

## 2016-09-01 ENCOUNTER — Encounter: Payer: Medicare Other | Admitting: Speech Pathology

## 2016-09-01 ENCOUNTER — Encounter: Payer: Medicare Other | Admitting: Occupational Therapy

## 2016-09-01 ENCOUNTER — Ambulatory Visit: Payer: Medicare Other

## 2016-09-01 ENCOUNTER — Ambulatory Visit
Admission: RE | Admit: 2016-09-01 | Discharge: 2016-09-01 | Disposition: A | Discharge status: Home / Self Care | Payer: Medicare Other | Source: Ambulatory Visit | Attending: Radiation Oncology | Admitting: Radiation Oncology

## 2016-09-01 DIAGNOSIS — Z510 Encounter for antineoplastic radiation therapy: Secondary | ICD-10-CM | POA: Diagnosis not present

## 2016-09-01 DIAGNOSIS — C711 Malignant neoplasm of frontal lobe: Secondary | ICD-10-CM | POA: Diagnosis not present

## 2016-09-01 DIAGNOSIS — C719 Malignant neoplasm of brain, unspecified: Secondary | ICD-10-CM | POA: Diagnosis not present

## 2016-09-02 ENCOUNTER — Ambulatory Visit
Admission: RE | Admit: 2016-09-02 | Discharge: 2016-09-02 | Disposition: A | Discharge status: Home / Self Care | Payer: Medicare Other | Source: Ambulatory Visit | Attending: Radiation Oncology | Admitting: Radiation Oncology

## 2016-09-02 VITALS — BP 98/54 | HR 97 | Resp 18 | Wt 197.8 lb

## 2016-09-02 DIAGNOSIS — C711 Malignant neoplasm of frontal lobe: Secondary | ICD-10-CM | POA: Diagnosis not present

## 2016-09-02 DIAGNOSIS — C719 Malignant neoplasm of brain, unspecified: Secondary | ICD-10-CM

## 2016-09-02 DIAGNOSIS — Z510 Encounter for antineoplastic radiation therapy: Secondary | ICD-10-CM | POA: Diagnosis not present

## 2016-09-02 NOTE — Progress Notes (Signed)
Weight stable. BP low. Denies feeling lightheaded or dizzy. Reports chronic knee pain continues. Reports a frequent dry cough that began one week ago. Reports she continues dex 4 mg AM and 2 mg PM. No evidence of thrush noted. Alopecia noted. Encourage patient to continue pure aloe twice a day within treatment field. Rash on chest and arms is still present. Encouraged patient to follow up dermatologist reference rash. Reports she continues OT but, has completed PT. Denies nausea, vomiting, diplopia or tinnitus.   BP (!) 98/54 (BP Location: Left Arm, Patient Position: Sitting, Cuff Size: Large)   Pulse 97   Resp 18   Wt 197 lb 12.8 oz (89.7 kg)   SpO2 100%   BMI 35.04 kg/m  Wt Readings from Last 3 Encounters:  09/02/16 197 lb 12.8 oz (89.7 kg)  08/30/16 195 lb 6.4 oz (88.6 kg)  08/26/16 194 lb 6.4 oz (88.2 kg)

## 2016-09-02 NOTE — Progress Notes (Signed)
  Radiation Oncology         [phone]   Name: Crystal Parsons MRN: [id number]   Date: 09/02/2016  DOB: [birth year]   Weekly Radiation Therapy Management    ICD-9-CM ICD-10-CM   1. Glioblastoma multiforme of brain (HCC) 191.9 C71.9     Current Dose: 42 Gy  Planned Dose:  60 Gy  Narrative The patient presents for routine under treatment assessment.  Weight stable. BP low. Denies feeling light headed or dizzy. Reports chronic knee pain continues. Reports a frequent dry cough that began one week ago, this doesn't bother her but she wants to be sure she doesn't have the flu. She received the flu shot this year. She denies fever. She is taking bactrim three times weekly. Reports she continues dex 4 mg AM and 2 mg PM. Nursing notes no evidence of thrush. Alopecia noted. The patient was encouraged to continue pure aloe twice a day within treatment field. Rash on chest and arms is still present. Reports she continues OT but, has completed PT. Denies nausea, vomiting, diplopia, or tinnitus. She reports hair loss.   Set-up films were reviewed. The chart was checked.  Physical Findings  weight is 197 lb 12.8 oz (89.7 kg). Her blood pressure is 98/54 (abnormal) and her pulse is 97. Her respiration is 18 and oxygen saturation is 100%. . Weight essentially stable. Alert and in no acute distress. Incision on scalp is well healed with a small S-scar. No infection or drainage noted.  Impression The patient is tolerating radiation.  Plan Continue treatment as planned. The patient will contact our office if her dry cough worsens. She is scheduled for follow up with Dr. Alvy Bimler on 09/06/2016.      Sheral Apley Tammi Klippel, M.D.  This document serves as a record of services personally performed by Tyler Pita, MD. It was created on his behalf by Arlyce Harman, a trained medical scribe. The creation of this record is based on the scribe's personal observations and the provider's statements to them. This  document has been checked and approved by the attending provider.

## 2016-09-05 ENCOUNTER — Ambulatory Visit
Admission: RE | Admit: 2016-09-05 | Discharge: 2016-09-05 | Disposition: A | Discharge status: Home / Self Care | Payer: Medicare Other | Source: Ambulatory Visit | Attending: Radiation Oncology | Admitting: Radiation Oncology

## 2016-09-05 ENCOUNTER — Other Ambulatory Visit (HOSPITAL_BASED_OUTPATIENT_CLINIC_OR_DEPARTMENT_OTHER): Payer: Medicare Other

## 2016-09-05 DIAGNOSIS — C719 Malignant neoplasm of brain, unspecified: Secondary | ICD-10-CM | POA: Diagnosis not present

## 2016-09-05 DIAGNOSIS — C711 Malignant neoplasm of frontal lobe: Secondary | ICD-10-CM | POA: Diagnosis present

## 2016-09-05 DIAGNOSIS — Z510 Encounter for antineoplastic radiation therapy: Secondary | ICD-10-CM | POA: Diagnosis not present

## 2016-09-05 LAB — CBC WITH DIFFERENTIAL/PLATELET
BASO%: 0.5 % (ref 0.0–2.0)
BASOS ABS: 0.1 10*3/uL (ref 0.0–0.1)
EOS ABS: 0.1 10*3/uL (ref 0.0–0.5)
EOS%: 0.5 % (ref 0.0–7.0)
HEMATOCRIT: 36.9 % (ref 34.8–46.6)
HGB: 12.4 g/dL (ref 11.6–15.9)
LYMPH%: 6.2 % — ABNORMAL LOW (ref 14.0–49.7)
MCH: 31.2 pg (ref 25.1–34.0)
MCHC: 33.6 g/dL (ref 31.5–36.0)
MCV: 92.9 fL (ref 79.5–101.0)
MONO#: 0.6 10*3/uL (ref 0.1–0.9)
MONO%: 5.3 % (ref 0.0–14.0)
NEUT#: 9.6 10*3/uL — ABNORMAL HIGH (ref 1.5–6.5)
NEUT%: 87.5 % — AB (ref 38.4–76.8)
Platelets: 165 10*3/uL (ref 145–400)
RBC: 3.97 10*6/uL (ref 3.70–5.45)
RDW: 16.1 % — ABNORMAL HIGH (ref 11.2–14.5)
WBC: 11 10*3/uL — ABNORMAL HIGH (ref 3.9–10.3)
lymph#: 0.7 10*3/uL — ABNORMAL LOW (ref 0.9–3.3)

## 2016-09-05 LAB — COMPREHENSIVE METABOLIC PANEL
ALT: 66 U/L — ABNORMAL HIGH (ref 0–55)
AST: 22 U/L (ref 5–34)
Albumin: 3.6 g/dL (ref 3.5–5.0)
Alkaline Phosphatase: 68 U/L (ref 40–150)
Anion Gap: 12 mEq/L — ABNORMAL HIGH (ref 3–11)
BILIRUBIN TOTAL: 0.48 mg/dL (ref 0.20–1.20)
BUN: 23.1 mg/dL (ref 7.0–26.0)
CALCIUM: 9.4 mg/dL (ref 8.4–10.4)
CO2: 21 meq/L — AB (ref 22–29)
CREATININE: 0.8 mg/dL (ref 0.6–1.1)
Chloride: 107 mEq/L (ref 98–109)
EGFR: 70 mL/min/{1.73_m2} — AB (ref >90)
GLUCOSE: 177 mg/dL — AB (ref 70–140)
POTASSIUM: 3.8 meq/L (ref 3.5–5.1)
SODIUM: 141 meq/L (ref 136–145)
Total Protein: 6.7 g/dL (ref 6.4–8.3)

## 2016-09-05 LAB — TECHNOLOGIST REVIEW

## 2016-09-06 ENCOUNTER — Ambulatory Visit (HOSPITAL_BASED_OUTPATIENT_CLINIC_OR_DEPARTMENT_OTHER): Payer: Medicare Other | Admitting: Hematology and Oncology

## 2016-09-06 ENCOUNTER — Ambulatory Visit: Payer: Medicare Other | Attending: Physical Medicine & Rehabilitation | Admitting: Speech Pathology

## 2016-09-06 ENCOUNTER — Ambulatory Visit: Payer: Medicare Other | Admitting: Occupational Therapy

## 2016-09-06 ENCOUNTER — Ambulatory Visit
Admission: RE | Admit: 2016-09-06 | Discharge: 2016-09-06 | Disposition: A | Discharge status: Home / Self Care | Payer: Medicare Other | Source: Ambulatory Visit | Attending: Radiation Oncology | Admitting: Radiation Oncology

## 2016-09-06 ENCOUNTER — Telehealth: Payer: Self-pay | Admitting: Hematology and Oncology

## 2016-09-06 DIAGNOSIS — R41844 Frontal lobe and executive function deficit: Secondary | ICD-10-CM | POA: Diagnosis not present

## 2016-09-06 DIAGNOSIS — R41842 Visuospatial deficit: Secondary | ICD-10-CM | POA: Insufficient documentation

## 2016-09-06 DIAGNOSIS — M6281 Muscle weakness (generalized): Secondary | ICD-10-CM | POA: Diagnosis not present

## 2016-09-06 DIAGNOSIS — R748 Abnormal levels of other serum enzymes: Secondary | ICD-10-CM | POA: Diagnosis not present

## 2016-09-06 DIAGNOSIS — R41841 Cognitive communication deficit: Secondary | ICD-10-CM | POA: Diagnosis not present

## 2016-09-06 DIAGNOSIS — G8194 Hemiplegia, unspecified affecting left nondominant side: Secondary | ICD-10-CM | POA: Diagnosis not present

## 2016-09-06 DIAGNOSIS — R278 Other lack of coordination: Secondary | ICD-10-CM

## 2016-09-06 DIAGNOSIS — Z510 Encounter for antineoplastic radiation therapy: Secondary | ICD-10-CM | POA: Diagnosis not present

## 2016-09-06 DIAGNOSIS — C719 Malignant neoplasm of brain, unspecified: Secondary | ICD-10-CM | POA: Diagnosis not present

## 2016-09-06 DIAGNOSIS — C711 Malignant neoplasm of frontal lobe: Secondary | ICD-10-CM

## 2016-09-06 DIAGNOSIS — R531 Weakness: Secondary | ICD-10-CM

## 2016-09-06 NOTE — Therapy (Signed)
[facility] [address] [place], [place], [address] Phone: [phone]   Fax:  [phone]  Speech Language Pathology Treatment  Patient Details  Name: Crystal Parsons MRN: [id number] Date of Birth: 05-Feb-1946 Referring Provider: Dr. Delice Lesch  Encounter Date: 09/06/2016      End of Session - 09/06/16 1203    Visit Number 8   Number of Visits 17   Date for SLP Re-Evaluation 09/21/16   Authorization Type Pt also receiving chemo/rad tx - may interfere with ability to schedule tx consistently   SLP Start Time 1103   SLP Stop Time  1147   SLP Time Calculation (min) 44 min   Activity Tolerance Patient tolerated treatment well      Past Medical History:  Diagnosis Date  . Anxiety   . Bradycardia    At times with pulse in the 40s  . Brain cancer ([facility])    Glioblastoma  . Diarrhea    With blating, improved with gluten-free diet  . Diastolic dysfunction   . Essential hypertension, benign    Always has HTN when at the doctor's office.  . 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 Gout   . Hyperlipidemia   . Lynch syndrome   . Mild aortic sclerosis ([facility])   . Mitral valve problem    Mildly thickened mitral valve  . Mitral valve prolapse    Mild, anterior  . MR (mitral regurgitation)    ECHO 08/10/09 shows mild MR again and normal EF. No significant changes from prior ECHO.  Marland Kitchen Palpitations    Occasional, but are not significant. ECHO 08/20/08 - Normal EF (60%), mildly thickened mitral valve with mild anterior mitral valve prolapse, mild MR, mild aortic sclerosis, grade 1 diastolic dysfunction.  . Proteinuria    Likely secondary to Diabetes    Past Surgical History:  Procedure Laterality Date  . ABDOMINAL HYSTERECTOMY    . APPLICATION OF CRANIAL NAVIGATION Right 07/07/2016   Procedure: APPLICATION OF CRANIAL NAVIGATION;  Surgeon: Consuella Lose, MD;  Location: [facility];  Service: Neurosurgery;   Laterality: Right;  . BREAST LUMPECTOMY Left   . CRANIOTOMY Right 07/07/2016   Procedure: CRANIOTOMY TUMOR EXCISION w/BrainLab;  Surgeon: Consuella Lose, MD;  Location: [facility];  Service: Neurosurgery;  Laterality: Right;  . HEMORROIDECTOMY  2009  . TUBAL LIGATION      There were no vitals filed for this visit.      Subjective Assessment - 09/06/16 1153    Subjective "She didn't sleep well so she is more confused today"   Currently in Pain? No/denies               ADULT SLP TREATMENT - 09/06/16 1156      General Information   Behavior/Cognition Alert;Cooperative;Pleasant mood     Treatment Provided   Treatment provided Cognitive-Linquistic     Pain Assessment   Pain Assessment No/denies pain     Cognitive-Linquistic Treatment   Treatment focused on Cognition   Skilled Treatment Alternating attention and simple reasoning facilitated in 3 pile card sort with usual mod A for reasoning and occasional min A to alternate attention back to all of the cards in her hand, rather than focusing only on one. Simple functional word problems comparing grocery prices with 85% accuracy and occasional min A.  Daughter to get pt set up with solitaire and memory match apps.     Assessment / Recommendations / Plan   Plan Continue with current plan  of care     Progression Toward Goals   Progression toward goals Progressing toward goals          SLP Education - 09/06/16 1159    Education provided Yes   Education Details Cognitive activities to do at home, use calendar for birthdays, anniversaries and social outings also          SLP Short Term Goals - 09/06/16 1203      SLP SHORT TERM GOAL #1   Title Pt will utilize external aids (notebook, timer) to manage schedule, lists, meds with rare min A over 2 sessions   Time 1   Period Weeks   Status Not Met     SLP SHORT TERM GOAL #2   Title Pt will selectively attend for 10 minutes in min noisy environment with a simple  cognitive lingusitic task with 90% accuracy   Time 1   Period Weeks   Status Not Met     SLP SHORT TERM GOAL #3   Title Pt will verbalize 3 cognitive impairments over 2 sessions with rare min A   Time 1   Period Weeks   Status Not Met     SLP SHORT TERM GOAL #4   Title Pt will perform mildly complex reasoning, functional math, attention to detail problems with 85% accuracy and occasional min A   Time 1   Period Weeks   Status Not Met          SLP Long Term Goals - 09/06/16 1203      SLP LONG TERM GOAL #1   Title Pt will demonstrate anticiaptory awareness self correcting errors on cognitive tasks with occasional min A   Time 4   Period Weeks   Status On-going     SLP LONG TERM GOAL #2   Title Pt will alternate attention between 2 simple cognitive lingusitic tasks with 85% on each and occasional min A   Time 4   Period Weeks   Status Revised     SLP LONG TERM GOAL #3   Title Pt will solve min-moderately complex reasoning, organizing and attention to detail problems with 90% accuracy and rare min A   Time 4   Period Weeks   Status Revised          Plan - 09/06/16 1200    Clinical Impression Statement Pt came in with calendar filled out - she utlized calendar to answer questions re: re schedule with rare min A. Encouraged pt to use calendar for birthdays, anniversaries, social plans also. Simple functional math (reasoning) with 85% accuracy and occasional min A. Moderately complex reasoning and altenrating attention with usual mod A. Continue skilled ST to maximize cognition for improved independence.    Speech Therapy Frequency 2x / week   Treatment/Interventions Compensatory strategies;Functional tasks;Cueing hierarchy;Patient/family education;Multimodal communcation approach;Internal/external aids;SLP instruction and feedback;Cognitive reorganization;Environmental controls   Potential to Achieve Goals Fair   Potential Considerations Co-morbidities   Consulted and  Agree with Plan of Care Patient   Family Member Consulted daugher      Patient will benefit from skilled therapeutic intervention in order to improve the following deficits and impairments:   Cognitive communication deficit    Problem List Patient Active Problem List   Diagnosis Date Noted  . Skin rash 08/30/2016  . Elevated liver enzymes 08/23/2016  . Anxiety attack 08/16/2016  . Other constipation 08/09/2016  . 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 Polyposis syndrome, familial 07/20/2016  . H/O: hysterectomy  07/20/2016  . Leukocytosis   . Monocytosis   . Acute lower UTI   . GBM (glioblastoma multiforme) ([facility])   . 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 MR (mitral regurgitation)   . Brain tumor ([facility])   . Apraxia   . Left-sided weakness   . Steroid-induced hyperglycemia   . Leucocytosis   . Benign essential HTN   . History of gout   . Dysuria   . Acute respiratory failure with hypoxia ([facility])   . Brain mass   . Seizure ([facility]) 06/30/2016  . Status epilepticus ([facility]) 06/30/2016    Lovvorn, Annye Rusk MS, CCC-SLP 09/06/2016, 12:04 PM  [facility] [address] [place], [place], [address] Phone: [phone]   Fax:  [phone]   Name: Crystal Parsons MRN: [id number] Date of Birth: April 05, 1946

## 2016-09-06 NOTE — Telephone Encounter (Signed)
Appointments scheduled per 09/06/16 los. Patient was given a copy of the AVS report and appointment schedule,per 09/06/16 los. °

## 2016-09-06 NOTE — Therapy (Signed)
[facility] [address] [place], [place], [address] Phone: [phone]   Fax:  [phone]  Occupational Therapy Treatment  Patient Details  Name: Crystal Parsons MRN: [id number] Date of Birth: [birth year] Referring Provider: Dr. Delice Lesch  Encounter Date: 09/06/2016      OT End of Session - 09/06/16 1221    Visit Number 8   Number of Visits 16   Date for OT Re-Evaluation 09/27/16   Authorization Type Medicare - will need G code and PN every 10th visit   Authorization Time Period 60 days   Authorization - Visit Number 8   Authorization - Number of Visits 10   OT Start Time [id number]  pt arrived late   OT Stop Time 1100   OT Time Calculation (min) 37 min      Past Medical History:  Diagnosis Date  . Anxiety   . Bradycardia    At times with pulse in the 40s  . Brain cancer ([facility])    Glioblastoma  . Diarrhea    With blating, improved with gluten-free diet  . Diastolic dysfunction   . Essential hypertension, benign    Always has HTN when at the doctor's office.  . 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 Gout   . Hyperlipidemia   . Lynch syndrome   . Mild aortic sclerosis ([place])   . Mitral valve problem    Mildly thickened mitral valve  . Mitral valve prolapse    Mild, anterior  . MR (mitral regurgitation)    ECHO 08/10/09 shows mild MR again and normal EF. No significant changes from prior ECHO.  Marland Kitchen Palpitations    Occasional, but are not significant. ECHO 08/20/08 - Normal EF (60%), mildly thickened mitral valve with mild anterior mitral valve prolapse, mild MR, mild aortic sclerosis, grade 1 diastolic dysfunction.  . Proteinuria    Likely secondary to Diabetes    Past Surgical History:  Procedure Laterality Date  . ABDOMINAL HYSTERECTOMY    . APPLICATION OF CRANIAL NAVIGATION Right 07/07/2016   Procedure: APPLICATION OF CRANIAL NAVIGATION;  Surgeon: Consuella Lose, MD;  Location: [facility];   Service: Neurosurgery;  Laterality: Right;  . BREAST LUMPECTOMY Left   . CRANIOTOMY Right 07/07/2016   Procedure: CRANIOTOMY TUMOR EXCISION w/BrainLab;  Surgeon: Consuella Lose, MD;  Location: [facility];  Service: Neurosurgery;  Laterality: Right;  . HEMORROIDECTOMY  2009  . TUBAL LIGATION      There were no vitals filed for this visit.      Subjective Assessment - 09/06/16 1026    Subjective  I feel a little confused today.  Dtr reports she didn't sleep well last  night.    Patient is accompained by: Family member  dtr   Pertinent History see epic pt with R frontal mass x2 s/p crani starts radiation and chemo today;    Patient Stated Goals I want to feel like myself again   Currently in Pain? No/denies                      OT Treatments/Exercises (OP) - 09/06/16 0001      ADLs   Overall ADLs Pt reports that she is having difficulty with initiating activities at home; educated pt and dtr that initiation is actually a high level cognitive skill and that given pt's diagnosis that this would be a normal sequalae to her brain tumor. Given pt's self report of initiation issues as well as memory  deficits, worked with pt and dtr to develop structured routine schedule and discussed with pt/dtr rationale. Both verbalized understanding.  Pt with increased confusion today - dtr stated pt did not sleep well last night.  Also discussed returning to some form of exercise which MD has approved per pt and dtr.  Will work with pt next visit to explore safe strategies for activities in the gym.  Pt in agreement.    Medication Management Discussed pt now beginning to set up pill box with family member supervising - pt and dtr in agreement.  Recommended that pt may want to get easy open bottles with large print to make task easier.     ADL Comments Addressed coordination and grip strength goals for L hand - see goals for update.  Pt also reports that she cooked breakfast the other morning (sausage  and eggs) without assistance and did a load of laundry.                    OT Short Term Goals - 09/06/16 1217      OT SHORT TERM GOAL #1   Title Pt and husband will be mod I with HEP - 08/30/2106   Time 4   Period Weeks   Status Achieved     OT SHORT TERM GOAL #2   Title Pt will decrease time on 9 hold peg with L hand by at least 4 seconds (baseline = 38.25) to assist with fine motor tasks   Time 4   Period Weeks   Status Achieved  30.41 secs      OT SHORT TERM GOAL #3   Title Pt will require supervision for simple, familiar hot meal prep   Time 4   Period Weeks   Status Achieved     OT SHORT TERM GOAL #4   Title Pt will demonstate ability to follow 2-3 step instruction within context of functional, familar task.    Time 4   Period Weeks   Status Achieved     OT SHORT TERM GOAL #5   Title Pt will identify at least 2 strategies for ST memory    Time 4   Period Weeks   Status On-going  ST memory strategies discussed will reinforce     OT SHORT TERM GOAL #6   Title Pt will require no more than 2 vc's for selective attention during functional familar tasks in a busy environment.    Time 4   Period Weeks   Status Achieved           OT Long Term Goals - 09/06/16 1217      OT LONG TERM GOAL #1   Title Pt and husband will be mod I with upgraded HEP prn - 09/27/2016   Time 8   Period Weeks   Status On-going     OT LONG TERM GOAL #2   Title Pt will decrease time on 9 hole peg test with L hand by at least 8 seconds (baseline=38.25) to assist with functional tasks.   Time 8   Period Weeks   Status Achieved  26.25     OT LONG TERM GOAL #3   Title Pt will increase grip strength by 5 pounds to assist with functional use of L, non dominant hand (baseline= 30 pounds)   Time 8   Period Weeks   Status Achieved  35 lbs. - dominant hand 40     OT LONG TERM GOAL #4   Title  Pt will be mod I with cooking 2 items simultaneously for altenating attention and  safety with familiar hot meal prep.    Time 8   Period Weeks   Status On-going     OT LONG TERM GOAL #5   Title Pt will be mod I for laundry and simple home mgmt tasks.   Time 8   Period Weeks   Status On-going     OT LONG TERM GOAL #6   Title Pt will be able to set up med pill box with supervision only   Time 8   Period Weeks   Status On-going     OT LONG TERM GOAL #7   Title Pt will be mod I to attend to L during functional tasks.    Time 8   Period Weeks   Status On-going               Plan - 09/06/16 1219    Clinical Impression Statement Pt is overall progressing toward goals however slightly more confused today and slower to process.   Rehab Potential Good   OT Frequency 2x / week   OT Duration 8 weeks   OT Treatment/Interventions Self-care/ADL training;DME and/or AE instruction;Neuromuscular education;Therapeutic exercise;Therapeutic activities;Patient/family education;Visual/perceptual remediation/compensation;Cognitive remediation/compensation   Plan finsh structured schedule, develop safe gym routine (possibly treadmill, bike and arm bike without resistance)   Consulted and Agree with Plan of Care Patient;Family member/caregiver   Family Member Consulted dtr      Patient will benefit from skilled therapeutic intervention in order to improve the following deficits and impairments:  Decreased activity tolerance, Decreased coordination, Decreased cognition, Decreased safety awareness, Decreased strength, Impaired UE functional use, Impaired vision/preception  Visit Diagnosis: Muscle weakness (generalized)  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 Skin rash 08/30/2016  . Elevated liver enzymes 08/23/2016  . Anxiety attack  08/16/2016  . Other constipation 08/09/2016  . 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 Polyposis syndrome, familial 07/20/2016  . H/O: hysterectomy 07/20/2016  . Leukocytosis   . Monocytosis   . Acute lower UTI   . GBM (glioblastoma multiforme) ([facility])   . 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 MR (mitral regurgitation)   . Brain tumor ([facility])   . Apraxia   . Left-sided weakness   . Steroid-induced hyperglycemia   . Leucocytosis   . Benign essential HTN   . History of gout   . Dysuria   . Acute respiratory failure with hypoxia ([facility])   . Brain mass   . Seizure ([facility]) 06/30/2016  . Status epilepticus ([facility]) 06/30/2016    Quay Burow, OTR/L 09/06/2016, 12:41 PM  [facility] [address] [place], [place], [address] Phone: [phone]   Fax:  [phone]  Name: Crystal Parsons MRN: [id number] Date of Birth: 12/17/1945

## 2016-09-07 ENCOUNTER — Ambulatory Visit
Admission: RE | Admit: 2016-09-07 | Discharge: 2016-09-07 | Disposition: A | Discharge status: Home / Self Care | Payer: Medicare Other | Source: Ambulatory Visit | Attending: Radiation Oncology | Admitting: Radiation Oncology

## 2016-09-07 ENCOUNTER — Encounter: Payer: Self-pay | Admitting: Hematology and Oncology

## 2016-09-07 DIAGNOSIS — C711 Malignant neoplasm of frontal lobe: Secondary | ICD-10-CM | POA: Diagnosis not present

## 2016-09-07 DIAGNOSIS — C719 Malignant neoplasm of brain, unspecified: Secondary | ICD-10-CM | POA: Diagnosis not present

## 2016-09-07 DIAGNOSIS — Z510 Encounter for antineoplastic radiation therapy: Secondary | ICD-10-CM | POA: Diagnosis not present

## 2016-09-07 NOTE — Progress Notes (Signed)
[facility] OFFICE PROGRESS NOTE  Patient Care Team: Lavone Orn, MD as PCP - General (Internal Medicine)  SUMMARY OF ONCOLOGIC HISTORY:   Glioblastoma multiforme of brain [facility])   06/30/2016 - 07/10/2016 Hospital Admission    The patient was admitted to the hospital after presentation with seizure. She was subsequently found to have brain tumor and underwent primary resection. She was subsequently discharged to rehabilitation facility      07/01/2016 Imaging    CT head showed mass lesion within the right frontal lobe measuring up to 4.8 cm. 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 However, a cerebral abscess may have a similar appearance. MRI with and without contrast is recommended for further characterization. 2. No significant mass effect or midline shift.  No hydrocephalus.      07/01/2016 Imaging    Ct chest showed right parahilar density appears to be atelectasis or possible infiltrate but no mass is identified. There is also streaky bibasilar subsegmental atelectasis and very small pleural effusions. 2. No mediastinal or hilar mass or adenopathy. Scattered lymph nodes are noted.      07/01/2016 Imaging    MRI brain showed Motion degraded examination. 3 x 4.3 x 4.5 cm complex RIGHT frontal lobe mass with imaging characteristics of primary brain tumor. 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 Local edema versus nonenhancing infiltrative tumor results in 2 mm RIGHT to LEFT midline shift. No ventricular entrapment.      07/07/2016 Imaging    Interval RIGHT craniotomy for resection of dominant RIGHT frontal lobe tumor without convincing evidence of residual local disease. Intraventricular extension of blood products without hydrocephalus. 2 mm RIGHT to LEFT midline shift without ventricular entrapment. Residual subcentimeter RIGHT frontal lobe satellite nodule consistent  with tumor.      07/07/2016 Surgery    Dr. Kathyrn Sheriff performed stereotactic right frontal craniotomy for resection of tumor       07/07/2016 Pathology Results    Accession: [id number] Brain, for tumor resection, Right Frontal Lobe - GLIOBLASTOMA MULTIFORME WHO GRADE IV/IV. - SEE ONCOLOGY TABLE BELOW. Microscopic Comment ONCOLOGY TABLE - BRAIN AND SPINAL CORD 1. Procedure: Resection 2. Tumor site, including laterality: Right frontal lobe 3. Maximum tumor size (cm): At least 4.8 cm (gross measurement) 4. Histologic type: Glioblastoma multiforme 5. Grade: WHO grade IV/IV 6. Margins (if applicable): Can not be assessed 7. Ancillary studies: Per protocol, a block will be sent for MGMT and IDH1/2 testing and MGMT was positive      08/02/2016 -  Radiation Therapy    She started concurrent chemo/RT      08/02/2016 -  Chemotherapy    She started concurrent chemo/RT       INTERVAL HISTORY: Please see below for problem oriented charting. She is seen as a weekly follow-up. She has reduced her dexamethasone dose drastically and complained of weakness, dizziness and fatigue. During daytime, she appears somewhat sedated. She has not been participating with physical therapy. Denies recent headaches and new neurological deficits  REVIEW OF SYSTEMS:   Constitutional: Denies fevers, chills or abnormal weight loss Eyes: Denies blurriness of vision Ears, nose, mouth, throat, and face: Denies mucositis or sore throat Respiratory: Denies cough, dyspnea or wheezes Cardiovascular: Denies palpitation, chest discomfort or lower extremity swelling Gastrointestinal:  Denies nausea, heartburn or change in bowel habits Skin: Denies abnormal skin rashes Lymphatics: Denies new lymphadenopathy or easy bruising Neurological:Denies numbness, tingling  Behavioral/Psych: Mood is stable, no new changes  All other systems were reviewed with the patient and are negative.  I have reviewed the past medical  history, past surgical history, social history and family history with the patient and they are unchanged from previous note.  ALLERGIES:  is allergic to accupril [quinapril hcl]; latex; vitamin e; naproxen; and zoloft [sertraline hcl].  MEDICATIONS:  Current Outpatient Prescriptions  Medication Sig Dispense Refill  . Cholecalciferol (VITAMIN D) 2000 UNITS tablet Take 2,000 Units by mouth daily.    Marland Kitchen dexamethasone (DECADRON) 2 MG tablet Take 1 tablet (2 mg total) by mouth 2 (two) times daily. (Patient taking differently: Take 1 mg by mouth 2 (two) times daily. ) 60 tablet 1  . famotidine (PEPCID) 20 MG tablet Take 1 tablet (20 mg total) by mouth 2 (two) times daily. 60 tablet 11  . levETIRAcetam (KEPPRA) 1000 MG tablet Take 1 tablet (1,000 mg total) by mouth 2 (two) times daily. 60 tablet 0  . LORazepam (ATIVAN) 0.5 MG tablet Take 1 tablet (0.5 mg total) by mouth 2 (two) times daily as needed for anxiety. 60 tablet 0  . losartan-hydrochlorothiazide (HYZAAR) 50-12.5 MG tablet Take 1 tablet by mouth daily. 30 tablet 0  . Multiple Vitamin (MULTIVITAMIN) tablet Take 1 tablet by mouth daily.    . ondansetron (ZOFRAN) 8 MG tablet Take 1 tablet (8 mg total) by mouth every 8 (eight) hours as needed for nausea. 60 tablet 3  . senna-docusate (SENOKOT-S) 8.6-50 MG tablet Take 2 tablets by mouth at bedtime. 100 tablet 0  . sulfamethoxazole-trimethoprim (BACTRIM DS,SEPTRA DS) 800-160 MG tablet Take 1 tablet by mouth 3 (three) times a week. 60 tablet 11  . temozolomide (TEMODAR) 140 MG capsule Take 1 capsule (140 mg total) by mouth daily. May take on an empty stomach or at bedtime to decrease nausea & vomiting. 17 capsule 0   No current facility-administered medications for this visit.     PHYSICAL EXAMINATION: ECOG PERFORMANCE STATUS: 1 - Symptomatic but completely ambulatory  Vitals:   09/06/16 1400  BP: 117/62  Pulse: (!) 105  Resp: 18  Temp: 97.6 F (36.4 C)   Filed Weights   09/06/16 1400   Weight: 198 lb 1.6 oz (89.9 kg)    GENERAL:alert, no distress and comfortable. She is obese and appear cushingoid SKIN: skin color, texture, turgor are normal, no rashes or significant lesions. Skin rash is less EYES: normal, Conjunctiva are pink and non-injected, sclera clear OROPHARYNX:no exudate, no erythema and lips, buccal mucosa, and tongue normal  NECK: supple, thyroid normal size, non-tender, without nodularity LYMPH:  no palpable lymphadenopathy in the cervical, axillary or inguinal LUNGS: clear to auscultation and percussion with normal breathing effort HEART: regular rate & rhythm and no murmurs and no lower extremity edema ABDOMEN:abdomen soft, non-tender and normal bowel sounds Musculoskeletal:no cyanosis of digits and no clubbing  NEURO: alert & oriented x 3 with fluent speech, no focal motor/sensory deficits  LABORATORY DATA:  I have reviewed the data as listed    Component Value Date/Time   NA 141 09/05/2016 1259   K 3.8 09/05/2016 1259   CL 103 07/18/2016 0439   CO2 21 (L) 09/05/2016 1259   GLUCOSE 177 (H) 09/05/2016 1259   BUN 23.1 09/05/2016 1259   CREATININE 0.8 09/05/2016 1259   CALCIUM 9.4 09/05/2016 1259   PROT 6.7 09/05/2016 1259   ALBUMIN 3.6 09/05/2016 1259   AST 22 09/05/2016 1259   ALT 66 (H) 09/05/2016 1259  ALKPHOS 68 09/05/2016 1259   BILITOT 0.48 09/05/2016 1259   GFRNONAA >60 07/18/2016 0439   GFRAA >60 07/18/2016 0439    No results found for: SPEP, UPEP  Lab Results  Component Value Date   WBC 11.0 (H) 09/05/2016   NEUTROABS 9.6 (H) 09/05/2016   HGB 12.4 09/05/2016   HCT 36.9 09/05/2016   MCV 92.9 09/05/2016   PLT 165 09/05/2016      Chemistry      Component Value Date/Time   NA 141 09/05/2016 1259   K 3.8 09/05/2016 1259   CL 103 07/18/2016 0439   CO2 21 (L) 09/05/2016 1259   BUN 23.1 09/05/2016 1259   CREATININE 0.8 09/05/2016 1259      Component Value Date/Time   CALCIUM 9.4 09/05/2016 1259   ALKPHOS 68 09/05/2016  1259   AST 22 09/05/2016 1259   ALT 66 (H) 09/05/2016 1259   BILITOT 0.48 09/05/2016 1259      ASSESSMENT & PLAN:  Glioblastoma multiforme of brain ([place]) Since recent dexamethasone taper, she felt better. Her blood sugar is lower and she is able to fit in the mask. She has signs and symptoms of steroid withdrawal including weakness, dizziness and evidence of hypotension I recommend we continue she resume steroid therapy at 2 mg in the morning and 1 mg in the afternoon I wonder if her sedation could be related to lorazepam that she takes prior to radiation treatment. I recommend reducing the dose of vomiting it altogether. She will continue to take Zofran before treatment to prevent nausea She will continue antimicrobial prophylaxis with Bactrim She will continue antiseizure medications I reinforced the importance of physical therapy and rehabilitation  Elevated liver enzymes She has mildly elevated liver enzymes, likely due to medication side effects and possible fatty liver congestion from the excessive weight gain This is not significant I recommend we continue same treatment with Temodar without dosage adjustment  Left-sided weakness She admits that she hasn't been doing much rehabilitation due to weakness and sedation. As discussed above, I recommend she resume dexamethasone afternoon dose and reduce the dose lorazepam. I continue to encourage her to increase activity as tolerated   No orders of the defined types were placed in this encounter.  All questions were answered. The patient knows to call the clinic with any problems, questions or concerns. No barriers to learning was detected. I spent 15 minutes counseling the patient face to face. The total time spent in the appointment was 20 minutes and more than 50% was on counseling and review of test results     Heath Lark, MD 09/07/2016 6:24 PM

## 2016-09-07 NOTE — Assessment & Plan Note (Signed)
She admits that she hasn't been doing much rehabilitation due to weakness and sedation. As discussed above, I recommend she resume dexamethasone afternoon dose and reduce the dose lorazepam. I continue to encourage her to increase activity as tolerated

## 2016-09-07 NOTE — Assessment & Plan Note (Signed)
Since recent dexamethasone taper, she felt better. Her blood sugar is lower and she is able to fit in the mask. She has signs and symptoms of steroid withdrawal including weakness, dizziness and evidence of hypotension I recommend we continue she resume steroid therapy at 2 mg in the morning and 1 mg in the afternoon I wonder if her sedation could be related to lorazepam that she takes prior to radiation treatment. I recommend reducing the dose of vomiting it altogether. She will continue to take Zofran before treatment to prevent nausea She will continue antimicrobial prophylaxis with Bactrim She will continue antiseizure medications I reinforced the importance of physical therapy and rehabilitation

## 2016-09-07 NOTE — Assessment & Plan Note (Signed)
She has mildly elevated liver enzymes, likely due to medication side effects and possible fatty liver congestion from the excessive weight gain This is not significant I recommend we continue same treatment with Temodar without dosage adjustment

## 2016-09-08 ENCOUNTER — Ambulatory Visit
Admission: RE | Admit: 2016-09-08 | Discharge: 2016-09-08 | Disposition: A | Discharge status: Home / Self Care | Payer: Medicare Other | Source: Ambulatory Visit | Attending: Radiation Oncology | Admitting: Radiation Oncology

## 2016-09-08 DIAGNOSIS — Z510 Encounter for antineoplastic radiation therapy: Secondary | ICD-10-CM | POA: Diagnosis not present

## 2016-09-08 DIAGNOSIS — C711 Malignant neoplasm of frontal lobe: Secondary | ICD-10-CM | POA: Diagnosis not present

## 2016-09-08 DIAGNOSIS — C719 Malignant neoplasm of brain, unspecified: Secondary | ICD-10-CM | POA: Diagnosis not present

## 2016-09-09 ENCOUNTER — Ambulatory Visit: Payer: Medicare Other

## 2016-09-09 ENCOUNTER — Encounter: Payer: Self-pay | Admitting: Physical Medicine & Rehabilitation

## 2016-09-09 ENCOUNTER — Encounter: Payer: Medicare Other | Attending: Physical Medicine & Rehabilitation | Admitting: Physical Medicine & Rehabilitation

## 2016-09-09 ENCOUNTER — Ambulatory Visit
Admission: RE | Admit: 2016-09-09 | Discharge: 2016-09-09 | Disposition: A | Discharge status: Home / Self Care | Payer: Medicare Other | Source: Ambulatory Visit | Attending: Radiation Oncology | Admitting: Radiation Oncology

## 2016-09-09 VITALS — BP 118/58 | HR 97 | Resp 18 | Wt 199.6 lb

## 2016-09-09 VITALS — BP 93/60 | HR 109

## 2016-09-09 DIAGNOSIS — C719 Malignant neoplasm of brain, unspecified: Secondary | ICD-10-CM

## 2016-09-09 DIAGNOSIS — R482 Apraxia: Secondary | ICD-10-CM

## 2016-09-09 DIAGNOSIS — Z833 Family history of diabetes mellitus: Secondary | ICD-10-CM | POA: Insufficient documentation

## 2016-09-09 DIAGNOSIS — R41841 Cognitive communication deficit: Secondary | ICD-10-CM

## 2016-09-09 DIAGNOSIS — D72829 Elevated white blood cell count, unspecified: Secondary | ICD-10-CM | POA: Insufficient documentation

## 2016-09-09 DIAGNOSIS — Z800 Family history of malignant neoplasm of digestive organs: Secondary | ICD-10-CM | POA: Diagnosis not present

## 2016-09-09 DIAGNOSIS — Z90710 Acquired absence of both cervix and uterus: Secondary | ICD-10-CM | POA: Insufficient documentation

## 2016-09-09 DIAGNOSIS — R4189 Other symptoms and signs involving cognitive functions and awareness: Secondary | ICD-10-CM | POA: Diagnosis not present

## 2016-09-09 DIAGNOSIS — I10 Essential (primary) hypertension: Secondary | ICD-10-CM

## 2016-09-09 DIAGNOSIS — I341 Nonrheumatic mitral (valve) prolapse: Secondary | ICD-10-CM | POA: Diagnosis not present

## 2016-09-09 DIAGNOSIS — R002 Palpitations: Secondary | ICD-10-CM | POA: Diagnosis not present

## 2016-09-09 DIAGNOSIS — R569 Unspecified convulsions: Secondary | ICD-10-CM | POA: Diagnosis not present

## 2016-09-09 DIAGNOSIS — E119 Type 2 diabetes mellitus without complications: Secondary | ICD-10-CM | POA: Diagnosis not present

## 2016-09-09 DIAGNOSIS — Z801 Family history of malignant neoplasm of trachea, bronchus and lung: Secondary | ICD-10-CM | POA: Insufficient documentation

## 2016-09-09 DIAGNOSIS — Z1509 Genetic susceptibility to other malignant neoplasm: Secondary | ICD-10-CM | POA: Diagnosis not present

## 2016-09-09 DIAGNOSIS — C711 Malignant neoplasm of frontal lobe: Secondary | ICD-10-CM | POA: Diagnosis not present

## 2016-09-09 DIAGNOSIS — E785 Hyperlipidemia, unspecified: Secondary | ICD-10-CM | POA: Diagnosis not present

## 2016-09-09 DIAGNOSIS — I340 Nonrheumatic mitral (valve) insufficiency: Secondary | ICD-10-CM | POA: Diagnosis not present

## 2016-09-09 DIAGNOSIS — R41844 Frontal lobe and executive function deficit: Secondary | ICD-10-CM | POA: Diagnosis not present

## 2016-09-09 DIAGNOSIS — Z510 Encounter for antineoplastic radiation therapy: Secondary | ICD-10-CM | POA: Diagnosis not present

## 2016-09-09 DIAGNOSIS — R41842 Visuospatial deficit: Secondary | ICD-10-CM | POA: Diagnosis not present

## 2016-09-09 DIAGNOSIS — R278 Other lack of coordination: Secondary | ICD-10-CM | POA: Diagnosis not present

## 2016-09-09 DIAGNOSIS — M109 Gout, unspecified: Secondary | ICD-10-CM | POA: Diagnosis not present

## 2016-09-09 DIAGNOSIS — R32 Unspecified urinary incontinence: Secondary | ICD-10-CM | POA: Diagnosis not present

## 2016-09-09 DIAGNOSIS — Z85841 Personal history of malignant neoplasm of brain: Secondary | ICD-10-CM | POA: Diagnosis not present

## 2016-09-09 DIAGNOSIS — M6281 Muscle weakness (generalized): Secondary | ICD-10-CM | POA: Diagnosis not present

## 2016-09-09 DIAGNOSIS — G8194 Hemiplegia, unspecified affecting left nondominant side: Secondary | ICD-10-CM | POA: Diagnosis not present

## 2016-09-09 NOTE — Progress Notes (Signed)
Subjective:    Patient ID: Crystal Parsons, female    DOB: 12-13-1945, 71 y.o.   MRN: [id number]  HPI 71 y.o. female with history of HTN, Lynch syndrome, palpitations, recent gout flare presents for follow up for GBM.  Last clinic visit 07/29/16.  Pt presents with husband, who provides majority of history.  Pt states she has been doing "pretty good" since last visit.  Received communication from SLP noting depression.  Pt is seeing Neurosurg to return in 3 months. She is still in Rad/Onc.  Completed PT, continues OT/SLP 2/week, but does not want to do HEP.  She is going to see Heme/Onc on Monday, who spoke with SLP and plan for depression.  Her bladder symptoms have resolved for the most part.   Pain Inventory Average Pain 0 Pain Right Now 0 My pain is intermittent and aching  In the last 24 hours, has pain interfered with the following? General activity 0 Relation with others 0 Enjoyment of life 0 What TIME of day is your pain at its worst? morning Sleep (in general) Good  Pain is worse with: inactivity Pain improves with: medication Relief from Meds: 9  Mobility walk without assistance ability to climb steps?  yes do you drive?  no transfers alone  Function retired I need assistance with the following:  meal prep, household duties and shopping  Neuro/Psych bladder control problems confusion depression anxiety  Prior Studies Any changes since last visit?  no  Physicians involved in your care Any changes since last visit?  no   Family History  Problem Relation Age of Onset  . Cancer Mother     lung cancer and colon ca  . Colon cancer Mother   . Cancer Father   . Colon cancer Father 58  . Diabetes Father    Social History   Social History  . Marital status: Married    Spouse name: Binah Abella  . Number of children: N/A  . Years of education: N/A   Social History Main Topics  . Smoking status: Never Smoker  . Smokeless tobacco: Never Used  .  Alcohol use Yes     Comment: socially  . Drug use: No  . Sexual activity: Not Asked   Other Topics Concern  . None   Social History Narrative  . None   Past Surgical History:  Procedure Laterality Date  . ABDOMINAL HYSTERECTOMY    . APPLICATION OF CRANIAL NAVIGATION Right 07/07/2016   Procedure: APPLICATION OF CRANIAL NAVIGATION;  Surgeon: Consuella Lose, MD;  Location: [facility];  Service: Neurosurgery;  Laterality: Right;  . BREAST LUMPECTOMY Left   . CRANIOTOMY Right 07/07/2016   Procedure: CRANIOTOMY TUMOR EXCISION w/BrainLab;  Surgeon: Consuella Lose, MD;  Location: [facility];  Service: Neurosurgery;  Laterality: Right;  . HEMORROIDECTOMY  2009  . TUBAL LIGATION     Past Medical History:  Diagnosis Date  . Anxiety   . Bradycardia    At times with pulse in the 40s  . Brain cancer ([place])    Glioblastoma  . Diarrhea    With blating, improved with gluten-free diet  . Diastolic dysfunction   . Essential hypertension, benign    Always has HTN when at the doctor's office.  . Glioblastoma multiforme of brain ([facility]) 07/11/2016  . Gout   . Hyperlipidemia   . Lynch syndrome   . Mild aortic sclerosis ([place])   . Mitral valve problem    Mildly thickened mitral valve  . Mitral valve  prolapse    Mild, anterior  . MR (mitral regurgitation)    ECHO 08/10/09 shows mild MR again and normal EF. No significant changes from prior ECHO.  Marland Kitchen Palpitations    Occasional, but are not significant. ECHO 08/20/08 - Normal EF (60%), mildly thickened mitral valve with mild anterior mitral valve prolapse, mild MR, mild aortic sclerosis, grade 1 diastolic dysfunction.  . Proteinuria    Likely secondary to Diabetes   BP 93/60   Pulse (!) 109   SpO2 95%   Opioid Risk Score:   Fall Risk Score:  `1  Depression screen PHQ 2/9  Depression screen PHQ 2/9 07/29/2016  Decreased Interest 3  Down, Depressed, Hopeless 0  PHQ - 2 Score 3  Altered sleeping 3  Tired, decreased energy 3  Change in appetite  3  Feeling bad or failure about yourself  0  Trouble concentrating 0  Moving slowly or fidgety/restless 1  Suicidal thoughts 0  PHQ-9 Score 13  Difficult doing work/chores Somewhat difficult   Review of Systems  Constitutional: Negative.   HENT: Negative.   Eyes: Negative.   Respiratory: Negative.   Cardiovascular: Negative.   Gastrointestinal: Negative.   Endocrine: Negative.   Genitourinary: Negative.   Musculoskeletal: Negative.   Skin: Negative.   Allergic/Immunologic: Negative.   Neurological: Negative.   Hematological: Negative.   Psychiatric/Behavioral: Positive for confusion and dysphoric mood.  All other systems reviewed and are negative.     Objective:   Physical Exam Constitutional: She appears well-developed. NAD. HENT: Normocephalic. Healing scar.   Eyes: EOM are normal. No discharge.  Cardiovascular: RRR. No JVD. Respiratory: Effort normal and breath sounds normal.  GI: Soft. Bowel sounds are normal.  Musculoskeletal: She exhibits no edema or tenderness. B/l knee valgus Gait: Slightly wide based Neurological: She is alert and oriented x3.  Motor: Motor: 4+-5/5 throughout  Skin: Skin is warm and dry.  Scalp healing.  Psychiatric: Her speech is normal, behavior and affect are normal.    Assessment & Plan:  71 y.o. female with history of HTN, Lynch syndrome, palpitations, recent gout flare presents for follow up for GBM.  1.  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   Improving  Cont follow up with Neurosurg  Cont therapies  Cont follow up with Rad/Onc, Heme/Onc  2.  Seizures  Cont keppra per Neurosurg.    4. Hypotension  Asymptomatic at present  Follow up with PCP  5. Urinary incontinence  Significantly improved  Cont timed voiding

## 2016-09-09 NOTE — Therapy (Signed)
[facility] [address] [place], [place], [address] Phone: [phone]   Fax:  [phone]  Speech Language Pathology Treatment  Patient Details  Name: Crystal Parsons MRN: [id number] Date of Birth: Jul 21, 1946 Referring Provider: Dr. Delice Lesch  Encounter Date: 09/09/2016      End of Session - 09/09/16 1223    Visit Number 9   Number of Visits 17   Date for SLP Re-Evaluation 09/21/16   SLP Start Time 1103   SLP Stop Time  1147   SLP Time Calculation (min) 44 min   Activity Tolerance Patient limited by lethargy      Past Medical History:  Diagnosis Date  . Anxiety   . Bradycardia    At times with pulse in the 40s  . Brain cancer ([place])    Glioblastoma  . Diarrhea    With blating, improved with gluten-free diet  . Diastolic dysfunction   . Essential hypertension, benign    Always has HTN when at the doctor's office.  . Glioblastoma multiforme of brain ([facility]) 07/11/2016  . Gout   . Hyperlipidemia   . Lynch syndrome   . Mild aortic sclerosis ([place])   . Mitral valve problem    Mildly thickened mitral valve  . Mitral valve prolapse    Mild, anterior  . MR (mitral regurgitation)    ECHO 08/10/09 shows mild MR again and normal EF. No significant changes from prior ECHO.  Marland Kitchen Palpitations    Occasional, but are not significant. ECHO 08/20/08 - Normal EF (60%), mildly thickened mitral valve with mild anterior mitral valve prolapse, mild MR, mild aortic sclerosis, grade 1 diastolic dysfunction.  . Proteinuria    Likely secondary to Diabetes    Past Surgical History:  Procedure Laterality Date  . ABDOMINAL HYSTERECTOMY    . APPLICATION OF CRANIAL NAVIGATION Right 07/07/2016   Procedure: APPLICATION OF CRANIAL NAVIGATION;  Surgeon: Consuella Lose, MD;  Location: [facility];  Service: Neurosurgery;  Laterality: Right;  . BREAST LUMPECTOMY Left   . CRANIOTOMY Right 07/07/2016   Procedure: CRANIOTOMY TUMOR EXCISION  w/BrainLab;  Surgeon: Consuella Lose, MD;  Location: [facility];  Service: Neurosurgery;  Laterality: Right;  . HEMORROIDECTOMY  2009  . TUBAL LIGATION      There were no vitals filed for this visit.      Subjective Assessment - 09/09/16 1126    Subjective "She has been nasty with me this week when I have asked her about her homework." (husband)   Currently in Pain? No/denies               ADULT SLP TREATMENT - 09/09/16 1130      General Information   Behavior/Cognition Alert;Cooperative;Pleasant mood     Treatment Provided   Treatment provided Cognitive-Linquistic     Cognitive-Linquistic Treatment   Treatment focused on Cognition   Skilled Treatment Pt completed questioning about schedule with calendar in front of her with min cues initially to look at calendar. Pt answered 100% success x5. In alternating attention (simple) pt returned to task in simple return to task activities (after pause for conversation, i.e.). In any task necessitating incr'd attention to detail pt req'd usual max A back to task when alternating between unscrambling sentence and writing the sentence.  Given the "s", SLP addressed pt's feelings in which she said she felt forced to get motivated by husband, adn was not motivated - SLP asked pt if she was depressed and pt affirmed she felt  she was. SLP inbaseketed physicians treating pt Tammi Klippel, [place], [employer]) and let them know this.     Assessment / Recommendations / Plan   Plan Continue with current plan of care     Progression Toward Goals   Progression toward goals Progressing toward goals          SLP Education - 09/09/16 1222    Education provided Yes   Education Details depression corresponding with rad/chemo not unusual   Person(s) Educated Patient;Spouse   Methods Explanation   Comprehension Verbalized understanding          SLP Short Term Goals - 09/06/16 1203      SLP SHORT TERM GOAL #1   Title Pt will utilize external  aids (notebook, timer) to manage schedule, lists, meds with rare min A over 2 sessions   Time 1   Period Weeks   Status Not Met     SLP SHORT TERM GOAL #2   Title Pt will selectively attend for 10 minutes in min noisy environment with a simple cognitive lingusitic task with 90% accuracy   Time 1   Period Weeks   Status Not Met     SLP SHORT TERM GOAL #3   Title Pt will verbalize 3 cognitive impairments over 2 sessions with rare min A   Time 1   Period Weeks   Status Not Met     SLP SHORT TERM GOAL #4   Title Pt will perform mildly complex reasoning, functional math, attention to detail problems with 85% accuracy and occasional min A   Time 1   Period Weeks   Status Not Met          SLP Long Term Goals - 09/09/16 1225      SLP LONG TERM GOAL #1   Title Pt will demonstrate anticiaptory awareness self correcting errors on cognitive tasks with occasional min A   Time 4   Period Weeks   Status On-going     SLP LONG TERM GOAL #2   Title Pt will alternate attention between 2 simple cognitive lingusitic tasks with 85% on each and occasional min A   Time 4   Period Weeks   Status On-going     SLP LONG TERM GOAL #3   Title Pt will solve min-moderately complex reasoning, organizing and attention to detail problems with 90% accuracy and rare min A   Time 4   Period Weeks   Status On-going          Plan - 09/09/16 1223    Clinical Impression Statement Pt cont to present today with cognitive linguistic deficits, cues usually needed for attention skills with multiple steps. Pt alternating attention was WNL for simple interruptions of <10 seconds (conversation). SLP discussed depression briefly with pt/husband, given "S" statement. Skilled ST remains necessary to improve pt independence.    Speech Therapy Frequency 2x / week   Duration --  8 weeks or 16 visis   Treatment/Interventions Compensatory strategies;Functional tasks;Cueing hierarchy;Patient/family education;Multimodal  communcation approach;Internal/external aids;SLP instruction and feedback;Cognitive reorganization;Environmental controls   Potential to Achieve Goals Fair   Potential Considerations Co-morbidities   Consulted and Agree with Plan of Care Patient      Patient will benefit from skilled therapeutic intervention in order to improve the following deficits and impairments:   Cognitive communication deficit    Problem List Patient Active Problem List   Diagnosis Date Noted  . Skin rash 08/30/2016  . Elevated liver enzymes 08/23/2016  .  Anxiety attack 08/16/2016  . Other constipation 08/09/2016  . Malignant frontal lobe tumor ([place])   . Polyposis syndrome, familial 07/20/2016  . H/O: hysterectomy 07/20/2016  . Leukocytosis   . Monocytosis   . Acute lower UTI   . GBM (glioblastoma multiforme) ([place])   . Glioblastoma multiforme of brain ([place]) 07/11/2016  . MR (mitral regurgitation)   . Brain tumor ([place])   . Apraxia   . Left-sided weakness   . Steroid-induced hyperglycemia   . Leucocytosis   . Benign essential HTN   . History of gout   . Dysuria   . Acute respiratory failure with hypoxia ([place])   . Brain mass   . Seizure ([place]) 06/30/2016  . Status epilepticus ([place]) 06/30/2016    [place] ,[place], [place]  09/09/2016, 12:26 PM  [facility] [address] [place], [place], [address] Phone: [phone]   Fax:  [phone]   Name: SAYA MCCOLL MRN: [id number] Date of Birth: 10/17/1945

## 2016-09-09 NOTE — Progress Notes (Signed)
  Radiation Oncology         [phone]   Name: Crystal Parsons MRN: [id number]   Date: 09/09/2016  DOB: 08-Feb-1946   Weekly Radiation Therapy Management    ICD-9-CM ICD-10-CM   1. Glioblastoma multiforme of brain (HCC) 191.9 C71.9     Current Dose: 52 Gy  Planned Dose:  60 Gy  Narrative The patient presents for routine under treatment assessment.  Weight and vitals stable. Denies pain. Faint hyperpigmentation with dry desquamation noted on forehead. Reports using Biafine cream and pure aoe on areas of skin breakdown on forehead and scalp. Reports using head and shoulders shampoo. Reports rash on her chest has resolved. Reports feeling depressed. Nursing offered referral for counseling, but, patient is not interested at this time. Attempted to normalize her feelings. Reports frontal headaches in the morning continue. Denies nausea, vomiting, tinnitus, or diplopia. Reports she has stopped taking Ativan prior to radiation treatment because she felt as though she was sleeping too much during the day. Reports since stopping the ativan she feels more awake. Reports her treatment mask fits better since the decadron taper. Confirms she is taking 2 mg dex bid. She is scheduled to follow up with Dr. Alvy Bimler on Monday and discuss further dex taper. She responds quickly and appropriately to questions. Reports moderate fatigue.   Set-up films were reviewed. The chart was checked.  Physical Findings  weight is 199 lb 9.6 oz (90.5 kg). Her blood pressure is 118/58 (abnormal) and her pulse is 97. Her respiration is 18 and oxygen saturation is 100%. . Weight essentially stable. Alert and in no acute distress. No radiation dermatitis noted.  Impression The patient is tolerating radiation.  Plan Continue treatment as planned. She is scheduled to follow with Dr. Alvy Bimler 09/12/16.      Sheral Apley Tammi Klippel, M.D.  This document serves as a record of services personally performed by Tyler Pita, MD. It was  created on his behalf by Arlyce Harman, a trained medical scribe. The creation of this record is based on the scribe's personal observations and the provider's statements to them. This document has been checked and approved by the attending provider.

## 2016-09-09 NOTE — Progress Notes (Signed)
Weight and vitals stable. Denies pain. Faint hyperpigmentation with dry desquamation noted on forehead. Reports using Biafine cream and pure aloe on areas of skin breakdown on forehead and scalp. Reports using head and shoulders shampoo. Reports rash on her chest has resolved. Reports feeling depressed. Offered referral for counseling but, patient no interest at this tim. Attempted to normalize her feelings. Reports frontal headaches in the morning continue. Denies nausea, vomiting, tinnitus or diplopia. Reports she has stopped taking Ativan prior to radiation treatment because she felt as though she was sleeping too much during the day. Reports since stopping the ativan she feel more awake. Reports her treatment mask fits better since the decadron taper. Confirms she is taking 2 mg of dex bid. Scheduled to follow up with [facility] on Monday and discuss further dex taper. No evidence of thrush noted. Responds quickly and appropriately to questions. Reports moderate fatigue.   BP (!) 118/58 (BP Location: Right Wrist, Patient Position: Sitting, Cuff Size: Small)   Pulse 97   Resp 18   Wt 199 lb 9.6 oz (90.5 kg)   SpO2 100%   BMI 35.36 kg/m  Wt Readings from Last 3 Encounters:  09/09/16 199 lb 9.6 oz (90.5 kg)  09/06/16 198 lb 1.6 oz (89.9 kg)  09/02/16 197 lb 12.8 oz (89.7 kg)

## 2016-09-12 ENCOUNTER — Other Ambulatory Visit (HOSPITAL_BASED_OUTPATIENT_CLINIC_OR_DEPARTMENT_OTHER): Payer: Medicare Other

## 2016-09-12 ENCOUNTER — Ambulatory Visit: Payer: Medicare Other

## 2016-09-12 ENCOUNTER — Ambulatory Visit
Admission: RE | Admit: 2016-09-12 | Discharge: 2016-09-12 | Disposition: A | Discharge status: Home / Self Care | Payer: Medicare Other | Source: Ambulatory Visit | Attending: Radiation Oncology | Admitting: Radiation Oncology

## 2016-09-12 ENCOUNTER — Ambulatory Visit (HOSPITAL_BASED_OUTPATIENT_CLINIC_OR_DEPARTMENT_OTHER): Payer: Medicare Other | Admitting: Hematology and Oncology

## 2016-09-12 DIAGNOSIS — C719 Malignant neoplasm of brain, unspecified: Secondary | ICD-10-CM

## 2016-09-12 DIAGNOSIS — D696 Thrombocytopenia, unspecified: Secondary | ICD-10-CM | POA: Diagnosis not present

## 2016-09-12 DIAGNOSIS — C711 Malignant neoplasm of frontal lobe: Secondary | ICD-10-CM

## 2016-09-12 DIAGNOSIS — R748 Abnormal levels of other serum enzymes: Secondary | ICD-10-CM

## 2016-09-12 DIAGNOSIS — F320 Major depressive disorder, single episode, mild: Secondary | ICD-10-CM | POA: Diagnosis not present

## 2016-09-12 DIAGNOSIS — Z510 Encounter for antineoplastic radiation therapy: Secondary | ICD-10-CM | POA: Diagnosis not present

## 2016-09-12 LAB — CBC WITH DIFFERENTIAL/PLATELET
BASO%: 0.2 % (ref 0.0–2.0)
Basophils Absolute: 0 10*3/uL (ref 0.0–0.1)
EOS%: 0 % (ref 0.0–7.0)
Eosinophils Absolute: 0 10*3/uL (ref 0.0–0.5)
HCT: 36.9 % (ref 34.8–46.6)
HGB: 12.1 g/dL (ref 11.6–15.9)
LYMPH%: 8.6 % — ABNORMAL LOW (ref 14.0–49.7)
MCH: 30.6 pg (ref 25.1–34.0)
MCHC: 32.8 g/dL (ref 31.5–36.0)
MCV: 93.4 fL (ref 79.5–101.0)
MONO#: 0.6 10*3/uL (ref 0.1–0.9)
MONO%: 7.3 % (ref 0.0–14.0)
NEUT%: 83.9 % — ABNORMAL HIGH (ref 38.4–76.8)
NEUTROS ABS: 7 10*3/uL — AB (ref 1.5–6.5)
Platelets: 134 10*3/uL — ABNORMAL LOW (ref 145–400)
RBC: 3.95 10*6/uL (ref 3.70–5.45)
RDW: 16 % — AB (ref 11.2–14.5)
WBC: 8.3 10*3/uL (ref 3.9–10.3)
lymph#: 0.7 10*3/uL — ABNORMAL LOW (ref 0.9–3.3)

## 2016-09-12 LAB — COMPREHENSIVE METABOLIC PANEL
ALT: 56 U/L — AB (ref 0–55)
AST: 17 U/L (ref 5–34)
Albumin: 3.7 g/dL (ref 3.5–5.0)
Alkaline Phosphatase: 60 U/L (ref 40–150)
Anion Gap: 12 mEq/L — ABNORMAL HIGH (ref 3–11)
BILIRUBIN TOTAL: 0.43 mg/dL (ref 0.20–1.20)
BUN: 20.6 mg/dL (ref 7.0–26.0)
CO2: 20 meq/L — AB (ref 22–29)
CREATININE: 0.9 mg/dL (ref 0.6–1.1)
Calcium: 9.6 mg/dL (ref 8.4–10.4)
Chloride: 109 mEq/L (ref 98–109)
EGFR: 66 mL/min/{1.73_m2} — ABNORMAL LOW (ref >90)
GLUCOSE: 181 mg/dL — AB (ref 70–140)
Potassium: 3.4 mEq/L — ABNORMAL LOW (ref 3.5–5.1)
SODIUM: 142 meq/L (ref 136–145)
TOTAL PROTEIN: 6.8 g/dL (ref 6.4–8.3)

## 2016-09-13 ENCOUNTER — Ambulatory Visit: Payer: Medicare Other | Admitting: Occupational Therapy

## 2016-09-13 ENCOUNTER — Encounter: Payer: Self-pay | Admitting: Hematology and Oncology

## 2016-09-13 ENCOUNTER — Encounter: Payer: Self-pay | Admitting: Occupational Therapy

## 2016-09-13 ENCOUNTER — Ambulatory Visit: Payer: Medicare Other | Admitting: Speech Pathology

## 2016-09-13 ENCOUNTER — Ambulatory Visit: Payer: Medicare Other

## 2016-09-13 ENCOUNTER — Ambulatory Visit
Admission: RE | Admit: 2016-09-13 | Discharge: 2016-09-13 | Disposition: A | Discharge status: Home / Self Care | Payer: Medicare Other | Source: Ambulatory Visit | Attending: Radiation Oncology | Admitting: Radiation Oncology

## 2016-09-13 VITALS — BP 109/60 | HR 103

## 2016-09-13 DIAGNOSIS — C719 Malignant neoplasm of brain, unspecified: Secondary | ICD-10-CM | POA: Diagnosis not present

## 2016-09-13 DIAGNOSIS — G8194 Hemiplegia, unspecified affecting left nondominant side: Secondary | ICD-10-CM | POA: Diagnosis not present

## 2016-09-13 DIAGNOSIS — R41842 Visuospatial deficit: Secondary | ICD-10-CM | POA: Diagnosis not present

## 2016-09-13 DIAGNOSIS — M6281 Muscle weakness (generalized): Secondary | ICD-10-CM

## 2016-09-13 DIAGNOSIS — R41841 Cognitive communication deficit: Secondary | ICD-10-CM | POA: Diagnosis not present

## 2016-09-13 DIAGNOSIS — R278 Other lack of coordination: Secondary | ICD-10-CM

## 2016-09-13 DIAGNOSIS — R41844 Frontal lobe and executive function deficit: Secondary | ICD-10-CM | POA: Diagnosis not present

## 2016-09-13 DIAGNOSIS — Z510 Encounter for antineoplastic radiation therapy: Secondary | ICD-10-CM | POA: Diagnosis not present

## 2016-09-13 DIAGNOSIS — D696 Thrombocytopenia, unspecified: Secondary | ICD-10-CM | POA: Insufficient documentation

## 2016-09-13 DIAGNOSIS — C711 Malignant neoplasm of frontal lobe: Secondary | ICD-10-CM | POA: Diagnosis not present

## 2016-09-13 DIAGNOSIS — F320 Major depressive disorder, single episode, mild: Secondary | ICD-10-CM | POA: Insufficient documentation

## 2016-09-13 NOTE — Assessment & Plan Note (Signed)
She is suffering from major depression. She admits she is depressed but not suicidal. She does not believe that medications will help her. Through a certain extent, she grieves the loss of function and increased dependency to her husband. She does not want to be told that she needs to follow instruction and exercise as instructed by physical therapy. She would like to be more active and perform some usual activities of daily living. She enjoys cooking. She felt that what cheer her up. She would also appreciate ability to talk to somebody. She is receptive to the idea of talking to our Education officer, museum. I continue to encourage the patient. I am wondering whether some of her mood changes could be related to dexamethasone

## 2016-09-13 NOTE — Therapy (Addendum)
[facility] [address] [place], [place], [address] Phone: [phone]   Fax:  [phone]  Speech Language Pathology Treatment  Patient Details  Name: Crystal Parsons MRN: [id number] Date of Birth: 10/06/1945 Referring Provider: Dr. Delice Lesch  Encounter Date: 09/13/2016      End of Session - 09/13/16 1205    Visit Number 10   Number of Visits 17   Date for SLP Re-Evaluation 09/21/16   Authorization Type Pt also receiving chemo/rad tx - may interfere with ability to schedule tx consistently   SLP Start Time 1105   SLP Stop Time  1148   SLP Time Calculation (min) 43 min      Past Medical History:  Diagnosis Date  . Anxiety   . Bradycardia    At times with pulse in the 40s  . Brain cancer ([place])    Glioblastoma  . Diarrhea    With blating, improved with gluten-free diet  . Diastolic dysfunction   . Essential hypertension, benign    Always has HTN when at the doctor's office.  . Glioblastoma multiforme of brain ([place]) 07/11/2016  . Gout   . Hyperlipidemia   . Lynch syndrome   . Mild aortic sclerosis ([place])   . Mitral valve problem    Mildly thickened mitral valve  . Mitral valve prolapse    Mild, anterior  . MR (mitral regurgitation)    ECHO 08/10/09 shows mild MR again and normal EF. No significant changes from prior ECHO.  Marland Kitchen Palpitations    Occasional, but are not significant. ECHO 08/20/08 - Normal EF (60%), mildly thickened mitral valve with mild anterior mitral valve prolapse, mild MR, mild aortic sclerosis, grade 1 diastolic dysfunction.  . Proteinuria    Likely secondary to Diabetes    Past Surgical History:  Procedure Laterality Date  . ABDOMINAL HYSTERECTOMY    . APPLICATION OF CRANIAL NAVIGATION Right 07/07/2016   Procedure: APPLICATION OF CRANIAL NAVIGATION;  Surgeon: Consuella Lose, MD;  Location: [facility];  Service: Neurosurgery;  Laterality: Right;  . BREAST LUMPECTOMY Left   .  CRANIOTOMY Right 07/07/2016   Procedure: CRANIOTOMY TUMOR EXCISION w/BrainLab;  Surgeon: Consuella Lose, MD;  Location: [facility];  Service: Neurosurgery;  Laterality: Right;  . HEMORROIDECTOMY  2009  . TUBAL LIGATION      There were no vitals filed for this visit.      Subjective Assessment - 09/13/16 1109    Subjective "They said I was depressed and that we've (spouse) have been together too much."               ADULT SLP TREATMENT - 09/13/16 1110      General Information   Behavior/Cognition Alert;Cooperative;Pleasant mood     Treatment Provided   Treatment provided Cognitive-Linquistic     Cognitive-Linquistic Treatment   Treatment focused on Cognition   Skilled Treatment Pt forgot her eye glasses and calendar. She reports improved independence with calendar at home. Attention to detail task with usual min to mod A to correct errors in written materials. Alternating attention between this task and simple conversatin required re-direction to cognitive task 3/5 times.      Assessment / Recommendations / Plan   Plan Continue with current plan of care     Progression Toward Goals   Progression toward goals Progressing toward goals            SLP Short Term Goals - 09/13/16 1205      SLP  SHORT TERM GOAL #1   Title Pt will utilize external aids (notebook, timer) to manage schedule, lists, meds with rare min A over 2 sessions   Time 1   Period Weeks   Status Not Met     SLP SHORT TERM GOAL #2   Title Pt will selectively attend for 10 minutes in min noisy environment with a simple cognitive lingusitic task with 90% accuracy   Time 1   Period Weeks   Status Not Met     SLP SHORT TERM GOAL #3   Title Pt will verbalize 3 cognitive impairments over 2 sessions with rare min A   Time 1   Period Weeks   Status Not Met     SLP SHORT TERM GOAL #4   Title Pt will perform mildly complex reasoning, functional math, attention to detail problems with 85% accuracy and  occasional min A   Time 1   Period Weeks   Status Not Met          SLP Long Term Goals - September 23, 2016 1205      SLP LONG TERM GOAL #1   Title Pt will demonstrate anticiaptory awareness self correcting errors on cognitive tasks with occasional min A   Time 3   Period Weeks   Status On-going     SLP LONG TERM GOAL #2   Title Pt will alternate attention between 2 simple cognitive lingusitic tasks with 85% on each and occasional min A   Time 3   Period Weeks   Status On-going     SLP LONG TERM GOAL #3   Title Pt will solve min-moderately complex reasoning, organizing and attention to detail problems with 90% accuracy and rare min A   Time 3   Period Weeks   Status On-going          Plan - 09-23-16 1201    Clinical Impression Statement Depression addressed by physician - pt making plans to spend less time with spouse and reduce her dependence on him. Pt does remain somewhat flat affect today. Pt required mod A for attention to details and min A to alternate attention between cognitive task and conversation. Continue skilled ST to maximize cognition for improved independence.    Speech Therapy Frequency 2x / week   Treatment/Interventions Compensatory strategies;Functional tasks;Cueing hierarchy;Patient/family education;Multimodal communcation approach;Internal/external aids;SLP instruction and feedback;Cognitive reorganization;Environmental controls   Potential to Achieve Goals Fair   Potential Considerations Co-morbidities   Consulted and Agree with Plan of Care Patient     Speech Therapy Progress Note  Dates of Reporting Period:  07/27/16 to 09-23-16  Objective Reports of Subjective Statement: Pt reports improved independence using external aids (calendar, notebook) to assist her recall and that she is less dependent on her spouse for this   Objective Measurements: See goals  Goal Update: Continue current goals  Plan: Continue current POC  Reason Skilled Services are  Required: Pt continues to demonstrate impaired alternating attention, recall, problem solving - she requires assistance from her spouse for higher level ADL tasks and she voices frustration with this. Continue skilled ST to improve cognition for improved independence.   Patient will benefit from skilled therapeutic intervention in order to improve the following deficits and impairments:   Cognitive communication deficit      G-Codes - 2016/09/23 1206    Functional Assessment Tool Used NOMS   Functional Limitations Attention   Attention Current Status (S3419) At least 40 percent but less than 60 percent impaired, limited or  restricted   Attention Goal Status [phone]) At least 20 percent but less than 40 percent impaired, limited or restricted      Problem List Patient Active Problem List   Diagnosis Date Noted  . Thrombocytopenia ([place]) 09/13/2016  . Depression, major, single episode, mild ([place]) 09/13/2016  . Skin rash 08/30/2016  . Elevated liver enzymes 08/23/2016  . Anxiety attack 08/16/2016  . Other constipation 08/09/2016  . 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 Polyposis syndrome, familial 07/20/2016  . H/O: hysterectomy 07/20/2016  . Leukocytosis   . Monocytosis   . Acute lower UTI   . GBM (glioblastoma multiforme) ([facility] [facility])   . Glioblastoma multiforme of brain ([facility]) 07/11/2016  . MR (mitral regurgitation)   . Brain tumor ([facility])   . Apraxia   . Left-sided weakness   . Steroid-induced hyperglycemia   . Leucocytosis   . Benign essential HTN   . History of gout   . Dysuria   . Acute respiratory failure with hypoxia ([facility])   . Brain mass   . Seizure ([facility]) 06/30/2016  . Status epilepticus ([place]) 06/30/2016    Alexxia Stankiewicz, Annye Rusk MS, CCC-SLP 09/13/2016, 12:06 PM  [facility] [address] [place], [place], [address] Phone: [phone]   Fax:  [phone]   Name: ABIGAYL HOR MRN: [id number] Date of Birth:  Nov 26, 1945

## 2016-09-13 NOTE — Progress Notes (Signed)
[facility] OFFICE PROGRESS NOTE  Patient Care Team: Lavone Orn, MD as PCP - General (Internal Medicine)  SUMMARY OF ONCOLOGIC HISTORY:   Glioblastoma multiforme of brain [facility])   06/30/2016 - 07/10/2016 Hospital Admission    The patient was admitted to the hospital after presentation with seizure. She was subsequently found to have brain tumor and underwent primary resection. She was subsequently discharged to rehabilitation facility      07/01/2016 Imaging    CT head showed mass lesion within the right frontal lobe measuring up to 4.8 cm. 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 However, a cerebral abscess may have a similar appearance. MRI with and without contrast is recommended for further characterization. 2. No significant mass effect or midline shift.  No hydrocephalus.      07/01/2016 Imaging    Ct chest showed right parahilar density appears to be atelectasis or possible infiltrate but no mass is identified. There is also streaky bibasilar subsegmental atelectasis and very small pleural effusions. 2. No mediastinal or hilar mass or adenopathy. Scattered lymph nodes are noted.      07/01/2016 Imaging    MRI brain showed Motion degraded examination. 3 x 4.3 x 4.5 cm complex RIGHT frontal lobe mass with imaging characteristics of primary brain tumor. 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 Local edema versus nonenhancing infiltrative tumor results in 2 mm RIGHT to LEFT midline shift. No ventricular entrapment.      07/07/2016 Imaging    Interval RIGHT craniotomy for resection of dominant RIGHT frontal lobe tumor without convincing evidence of residual local disease. Intraventricular extension of blood products without hydrocephalus. 2 mm RIGHT to LEFT midline shift without ventricular entrapment. Residual subcentimeter RIGHT frontal lobe satellite nodule consistent  with tumor.      07/07/2016 Surgery    Dr. Kathyrn Sheriff performed stereotactic right frontal craniotomy for resection of tumor       07/07/2016 Pathology Results    Accession: [id number] Brain, for tumor resection, Right Frontal Lobe - GLIOBLASTOMA MULTIFORME WHO GRADE IV/IV. - SEE ONCOLOGY TABLE BELOW. Microscopic Comment ONCOLOGY TABLE - BRAIN AND SPINAL CORD 1. Procedure: Resection 2. Tumor site, including laterality: Right frontal lobe 3. Maximum tumor size (cm): At least 4.8 cm (gross measurement) 4. Histologic type: Glioblastoma multiforme 5. Grade: WHO grade IV/IV 6. Margins (if applicable): Can not be assessed 7. Ancillary studies: Per protocol, a block will be sent for MGMT and IDH1/2 testing and MGMT was positive      08/02/2016 -  Radiation Therapy    She started concurrent chemo/RT      08/02/2016 -  Chemotherapy    She started concurrent chemo/RT       INTERVAL HISTORY: Please see below for problem oriented charting. She returns today with her husband. Her husband is concerned that she has not been exercising for the past 10 days. She was seen by speech therapist last week who noticed that the patient is depressed. She denies suicidal ideation From the medical standpoint, she is able to tolerate reduced dose dexamethasone 2 mg twice day. She denies recent headache, seizure activities or progressive weakness. She denies recent infection  REVIEW OF SYSTEMS:   Constitutional: Denies fevers, chills or abnormal weight loss Eyes: Denies blurriness of vision Ears, nose, mouth, throat, and face: Denies mucositis or sore throat Respiratory: Denies cough, dyspnea or wheezes Cardiovascular: Denies palpitation, chest discomfort or lower extremity  swelling Gastrointestinal:  Denies nausea, heartburn or change in bowel habits Skin: Denies abnormal skin rashes Lymphatics: Denies new lymphadenopathy or easy bruising Neurological:Denies numbness, tingling or new  weaknesses Behavioral/Psych: Mood is stable, no new changes  All other systems were reviewed with the patient and are negative.  I have reviewed the past medical history, past surgical history, social history and family history with the patient and they are unchanged from previous note.  ALLERGIES:  is allergic to accupril [quinapril hcl]; latex; vitamin e; naproxen; and zoloft [sertraline hcl].  MEDICATIONS:  Current Outpatient Prescriptions  Medication Sig Dispense Refill  . Cholecalciferol (VITAMIN D) 2000 UNITS tablet Take 2,000 Units by mouth daily.    Marland Kitchen dexamethasone (DECADRON) 2 MG tablet Take 1 tablet (2 mg total) by mouth 2 (two) times daily. (Patient taking differently: Take 1 mg by mouth 2 (two) times daily. ) 60 tablet 1  . famotidine (PEPCID) 20 MG tablet Take 1 tablet (20 mg total) by mouth 2 (two) times daily. 60 tablet 11  . levETIRAcetam (KEPPRA) 1000 MG tablet Take 1 tablet (1,000 mg total) by mouth 2 (two) times daily. 60 tablet 0  . LORazepam (ATIVAN) 0.5 MG tablet Take 1 tablet (0.5 mg total) by mouth 2 (two) times daily as needed for anxiety. (Patient not taking: Reported on 09/09/2016) 60 tablet 0  . losartan-hydrochlorothiazide (HYZAAR) 50-12.5 MG tablet Take 1 tablet by mouth daily. 30 tablet 0  . Multiple Vitamin (MULTIVITAMIN) tablet Take 1 tablet by mouth daily.    . ondansetron (ZOFRAN) 8 MG tablet Take 1 tablet (8 mg total) by mouth every 8 (eight) hours as needed for nausea. 60 tablet 3  . senna-docusate (SENOKOT-S) 8.6-50 MG tablet Take 2 tablets by mouth at bedtime. 100 tablet 0  . sulfamethoxazole-trimethoprim (BACTRIM DS,SEPTRA DS) 800-160 MG tablet Take 1 tablet by mouth 3 (three) times a week. 60 tablet 11  . temozolomide (TEMODAR) 140 MG capsule Take 1 capsule (140 mg total) by mouth daily. May take on an empty stomach or at bedtime to decrease nausea & vomiting. 17 capsule 0   No current facility-administered medications for this visit.     PHYSICAL  EXAMINATION: ECOG PERFORMANCE STATUS: 1 - Symptomatic but completely ambulatory  Vitals:   09/12/16 1349  BP: (!) 120/57  Pulse: 94  Resp: 17  Temp: 98.6 F (37 C)   Filed Weights   09/12/16 1349  Weight: 198 lb 12.8 oz (90.2 kg)    GENERAL:alert, no distress and comfortable. She looks mildly cushingoid SKIN: skin color, texture, turgor are normal, no rashes or significant lesions EYES: normal, Conjunctiva are pink and non-injected, sclera clear Musculoskeletal:no cyanosis of digits and no clubbing  NEURO: alert & oriented x 3 with occasional word finding difficulties, no focal motor/sensory deficits  LABORATORY DATA:  I have reviewed the data as listed    Component Value Date/Time   NA 142 09/12/2016 1254   K 3.4 (L) 09/12/2016 1254   CL 103 07/18/2016 0439   CO2 20 (L) 09/12/2016 1254   GLUCOSE 181 (H) 09/12/2016 1254   BUN 20.6 09/12/2016 1254   CREATININE 0.9 09/12/2016 1254   CALCIUM 9.6 09/12/2016 1254   PROT 6.8 09/12/2016 1254   ALBUMIN 3.7 09/12/2016 1254   AST 17 09/12/2016 1254   ALT 56 (H) 09/12/2016 1254   ALKPHOS 60 09/12/2016 1254   BILITOT 0.43 09/12/2016 1254   GFRNONAA >60 07/18/2016 0439   GFRAA >60 07/18/2016 0439    No results found  for: SPEP, UPEP  Lab Results  Component Value Date   WBC 8.3 09/12/2016   NEUTROABS 7.0 (H) 09/12/2016   HGB 12.1 09/12/2016   HCT 36.9 09/12/2016   MCV 93.4 09/12/2016   PLT 134 (L) 09/12/2016      Chemistry      Component Value Date/Time   NA 142 09/12/2016 1254   K 3.4 (L) 09/12/2016 1254   CL 103 07/18/2016 0439   CO2 20 (L) 09/12/2016 1254   BUN 20.6 09/12/2016 1254   CREATININE 0.9 09/12/2016 1254      Component Value Date/Time   CALCIUM 9.6 09/12/2016 1254   ALKPHOS 60 09/12/2016 1254   AST 17 09/12/2016 1254   ALT 56 (H) 09/12/2016 1254   BILITOT 0.43 09/12/2016 1254       ASSESSMENT & PLAN:  Glioblastoma multiforme of brain ([place]) She appears to tolerate dexamethasone taper well.   I recommend we continue she take steroid therapy at 2 mg in the morning and 2 milligram in the afternoon on alternate days She has not used lorazepam for the past week and is sedative effects had resolved She will continue to take Zofran before treatment to prevent nausea She will continue antimicrobial prophylaxis with Bactrim She will continue antiseizure medications I reinforced the importance of physical therapy and rehabilitation  Thrombocytopenia ([place]) This is likely due to recent treatment. The patient denies recent history of bleeding such as epistaxis, hematuria or hematochezia. She is asymptomatic from the low platelet count. I will observe for now.  she does not require transfusion now. I will continue the chemotherapy at current dose without dosage adjustment.  If the thrombocytopenia gets progressive worse in the future, I might have to delay her treatment or adjust the chemotherapy dose.    Elevated liver enzymes She has mildly elevated liver enzymes, likely due to medication side effects and possible fatty liver congestion from the excessive weight gain This is not significant I recommend we continue same treatment with Temodar without dosage adjustment  Depression, major, single episode, mild ([place]) She is suffering from major depression. She admits she is depressed but not suicidal. She does not believe that medications will help her. Through a certain extent, she grieves the loss of function and increased dependency to her husband. She does not want to be told that she needs to follow instruction and exercise as instructed by physical therapy. She would like to be more active and perform some usual activities of daily living. She enjoys cooking. She felt that what cheer her up. She would also appreciate ability to talk to somebody. She is receptive to the idea of talking to our Education officer, museum. I continue to encourage the patient. I am wondering whether some of her mood  changes could be related to dexamethasone   No orders of the defined types were placed in this encounter.  All questions were answered. The patient knows to call the clinic with any problems, questions or concerns. No barriers to learning was detected. I spent 25 minutes counseling the patient face to face. The total time spent in the appointment was 30 minutes and more than 50% was on counseling and review of test results     Heath Lark, MD 09/13/2016 6:35 AM

## 2016-09-13 NOTE — Therapy (Signed)
[facility] [address] [place], [place], [address] Phone: [phone]   Fax:  [phone]  Occupational Therapy Treatment  Patient Details  Name: Crystal Parsons MRN: [id number] Date of Birth: Nov 07, 1945 Referring Provider: Dr. Delice Lesch  Encounter Date: 09/13/2016      OT End of Session - 09/13/16 1246    Visit Number 9   Number of Visits 16   Date for OT Re-Evaluation 09/27/16   Authorization Type Medicare - will need G code and PN every 10th visit   Authorization Time Period 60 days   Authorization - Visit Number 9   Authorization - Number of Visits 10   OT Start Time 0935   OT Stop Time 1015   OT Time Calculation (min) 40 min   Activity Tolerance Patient tolerated treatment well      Past Medical History:  Diagnosis Date  . Anxiety   . Bradycardia    At times with pulse in the 40s  . Brain cancer ([facility])    Glioblastoma  . Diarrhea    With blating, improved with gluten-free diet  . Diastolic dysfunction   . Essential hypertension, benign    Always has HTN when at the doctor's office.  . 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 Gout   . Hyperlipidemia   . Lynch syndrome   . Mild aortic sclerosis ([facility])   . Mitral valve problem    Mildly thickened mitral valve  . Mitral valve prolapse    Mild, anterior  . MR (mitral regurgitation)    ECHO 08/10/09 shows mild MR again and normal EF. No significant changes from prior ECHO.  Marland Kitchen Palpitations    Occasional, but are not significant. ECHO 08/20/08 - Normal EF (60%), mildly thickened mitral valve with mild anterior mitral valve prolapse, mild MR, mild aortic sclerosis, grade 1 diastolic dysfunction.  . Proteinuria    Likely secondary to Diabetes    Past Surgical History:  Procedure Laterality Date  . ABDOMINAL HYSTERECTOMY    . APPLICATION OF CRANIAL NAVIGATION Right 07/07/2016   Procedure: APPLICATION OF CRANIAL NAVIGATION;  Surgeon:  Consuella Lose, MD;  Location: [facility];  Service: Neurosurgery;  Laterality: Right;  . BREAST LUMPECTOMY Left   . CRANIOTOMY Right 07/07/2016   Procedure: CRANIOTOMY TUMOR EXCISION w/BrainLab;  Surgeon: Consuella Lose, MD;  Location: [facility];  Service: Neurosurgery;  Laterality: Right;  . HEMORROIDECTOMY  2009  . TUBAL LIGATION      Vitals:   09/13/16 0949  BP: 109/60  Pulse: (!) 103        Subjective Assessment - 09/13/16 0944    Subjective  I was a little dizzy yesterday but not today.   Patient is accompained by: Family member  husband in waiting room   Pertinent History see epic pt with R frontal mass x2 s/p crani starts radiation and chemo today;    Patient Stated Goals I want to feel like myself again   Currently in Pain? No/denies                      OT Treatments/Exercises (OP) - 09/13/16 0001      ADLs   ADL Comments Collaborated with pt to develop structued schedule to assist with initation, memory, motivation and carry through.  Discussed several ways with pt to incorporate HEP from PT into enjoyable activities (i.e walking 30 minutes 3x/wk could be done while shopping).  Will formalize schedule and educate pt  and family next session. Pt required min vc's for selective attention and mod vc's to generate solutions.  Reinforced with pt the need to begin to incoporate pleasureable activiities back into schedule and pt in agreement.                   OT Short Term Goals - 09/13/16 1244      OT SHORT TERM GOAL #1   Title Pt and husband will be mod I with HEP - 08/30/2106   Time 4   Period Weeks   Status Achieved     OT SHORT TERM GOAL #2   Title Pt will decrease time on 9 hold peg with L hand by at least 4 seconds (baseline = 38.25) to assist with fine motor tasks   Time 4   Period Weeks   Status Achieved  30.41 secs      OT SHORT TERM GOAL #3   Title Pt will require supervision for simple, familiar hot meal prep   Time 4   Period  Weeks   Status Achieved     OT SHORT TERM GOAL #4   Title Pt will demonstate ability to follow 2-3 step instruction within context of functional, familar task.    Time 4   Period Weeks   Status Achieved     OT SHORT TERM GOAL #5   Title Pt will identify at least 2 strategies for ST memory    Time 4   Period Weeks   Status On-going  ST memory strategies discussed will reinforce     OT SHORT TERM GOAL #6   Title Pt will require no more than 2 vc's for selective attention during functional familar tasks in a busy environment.    Time 4   Period Weeks   Status Achieved           OT Long Term Goals - 09/13/16 1244      OT LONG TERM GOAL #1   Title Pt and husband will be mod I with upgraded HEP prn - 09/27/2016   Time 8   Period Weeks   Status On-going     OT LONG TERM GOAL #2   Title Pt will decrease time on 9 hole peg test with L hand by at least 8 seconds (baseline=38.25) to assist with functional tasks.   Time 8   Period Weeks   Status Achieved  26.25     OT LONG TERM GOAL #3   Title Pt will increase grip strength by 5 pounds to assist with functional use of L, non dominant hand (baseline= 30 pounds)   Time 8   Period Weeks   Status Achieved  35 lbs. - dominant hand 40     OT LONG TERM GOAL #4   Title Pt will be mod I with cooking 2 items simultaneously for altenating attention and safety with familiar hot meal prep.    Time 8   Period Weeks   Status On-going     OT LONG TERM GOAL #5   Title Pt will be mod I for laundry and simple home mgmt tasks.   Time 8   Period Weeks   Status On-going     OT LONG TERM GOAL #6   Title Pt will be able to set up med pill box with supervision only   Time 8   Period Weeks   Status On-going     OT LONG TERM GOAL #7   Title Pt will be  mod I to attend to L during functional tasks.    Time 8   Period Weeks   Status On-going               Plan - 09/13/16 1245    Clinical Impression Statement Pt progressing  toward goals. Pt reports that chemo and radiation will be done this week.    Rehab Potential Good   OT Frequency 2x / week   OT Duration 8 weeks   OT Treatment/Interventions Self-care/ADL training;DME and/or AE instruction;Neuromuscular education;Therapeutic exercise;Therapeutic activities;Patient/family education;Visual/perceptual remediation/compensation;Cognitive remediation/compensation   Plan complete structured schedule and educate family, address remaining LTG's   Consulted and Agree with Plan of Care Patient      Patient will benefit from skilled therapeutic intervention in order to improve the following deficits and impairments:  Decreased activity tolerance, Decreased coordination, Decreased cognition, Decreased safety awareness, Decreased strength, Impaired UE functional use, Impaired vision/preception  Visit Diagnosis: Muscle weakness (generalized)  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 Thrombocytopenia ([facility]) 09/13/2016  . Depression, major, single episode, mild ([facility]) 09/13/2016  . Skin rash 08/30/2016  . Elevated liver enzymes 08/23/2016  . Anxiety attack 08/16/2016  . Other constipation 08/09/2016  . Malignant frontal lobe tumor ([facility])   . Polyposis syndrome, familial 07/20/2016  . H/O: hysterectomy 07/20/2016  . Leukocytosis   . Monocytosis   . Acute lower UTI   . GBM (glioblastoma multiforme) ([facility])   . 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 MR (mitral regurgitation)   . Brain tumor ([facility])   . Apraxia   . Left-sided weakness   . Steroid-induced hyperglycemia   . Leucocytosis   . Benign essential HTN   . History of gout   . Dysuria   . Acute respiratory failure with hypoxia ([facility])   . Brain mass   . Seizure  ([facility]) 06/30/2016  . Status epilepticus ([facility]) 06/30/2016    Quay Burow, OTR/L 09/13/2016, 12:48 PM  [facility] [address] [place], [place], [address] Phone: [phone]   Fax:  [phone]  Name: LADEDRA GALLAS MRN: [id number] Date of Birth: July 06, 1946

## 2016-09-13 NOTE — Assessment & Plan Note (Signed)
This is likely due to recent treatment. The patient denies recent history of bleeding such as epistaxis, hematuria or hematochezia. She is asymptomatic from the low platelet count. I will observe for now.  she does not require transfusion now. I will continue the chemotherapy at current dose without dosage adjustment.  If the thrombocytopenia gets progressive worse in the future, I might have to delay her treatment or adjust the chemotherapy dose.   

## 2016-09-13 NOTE — Assessment & Plan Note (Signed)
She has mildly elevated liver enzymes, likely due to medication side effects and possible fatty liver congestion from the excessive weight gain This is not significant I recommend we continue same treatment with Temodar without dosage adjustment

## 2016-09-13 NOTE — Assessment & Plan Note (Signed)
She appears to tolerate dexamethasone taper well.  I recommend we continue she take steroid therapy at 2 mg in the morning and 2 milligram in the afternoon on alternate days She has not used lorazepam for the past week and is sedative effects had resolved She will continue to take Zofran before treatment to prevent nausea She will continue antimicrobial prophylaxis with Bactrim She will continue antiseizure medications I reinforced the importance of physical therapy and rehabilitation

## 2016-09-14 ENCOUNTER — Ambulatory Visit
Admission: RE | Admit: 2016-09-14 | Discharge: 2016-09-14 | Disposition: A | Discharge status: Home / Self Care | Payer: Medicare Other | Source: Ambulatory Visit | Attending: Radiation Oncology | Admitting: Radiation Oncology

## 2016-09-14 DIAGNOSIS — C711 Malignant neoplasm of frontal lobe: Secondary | ICD-10-CM | POA: Diagnosis not present

## 2016-09-14 DIAGNOSIS — Z510 Encounter for antineoplastic radiation therapy: Secondary | ICD-10-CM | POA: Diagnosis not present

## 2016-09-14 DIAGNOSIS — C719 Malignant neoplasm of brain, unspecified: Secondary | ICD-10-CM | POA: Diagnosis not present

## 2016-09-15 ENCOUNTER — Ambulatory Visit
Admission: RE | Admit: 2016-09-15 | Discharge: 2016-09-15 | Disposition: A | Discharge status: Home / Self Care | Payer: Medicare Other | Source: Ambulatory Visit | Attending: Radiation Oncology | Admitting: Radiation Oncology

## 2016-09-15 ENCOUNTER — Encounter: Payer: Self-pay | Admitting: *Deleted

## 2016-09-15 ENCOUNTER — Ambulatory Visit: Payer: Medicare Other | Admitting: Speech Pathology

## 2016-09-15 ENCOUNTER — Encounter: Payer: Self-pay | Admitting: Radiation Oncology

## 2016-09-15 VITALS — BP 121/53 | HR 97 | Resp 18 | Wt 200.0 lb

## 2016-09-15 DIAGNOSIS — R41841 Cognitive communication deficit: Secondary | ICD-10-CM

## 2016-09-15 DIAGNOSIS — C719 Malignant neoplasm of brain, unspecified: Secondary | ICD-10-CM | POA: Diagnosis not present

## 2016-09-15 DIAGNOSIS — M6281 Muscle weakness (generalized): Secondary | ICD-10-CM | POA: Diagnosis not present

## 2016-09-15 DIAGNOSIS — G8194 Hemiplegia, unspecified affecting left nondominant side: Secondary | ICD-10-CM | POA: Diagnosis not present

## 2016-09-15 DIAGNOSIS — C711 Malignant neoplasm of frontal lobe: Secondary | ICD-10-CM | POA: Diagnosis not present

## 2016-09-15 DIAGNOSIS — R41844 Frontal lobe and executive function deficit: Secondary | ICD-10-CM | POA: Diagnosis not present

## 2016-09-15 DIAGNOSIS — R41842 Visuospatial deficit: Secondary | ICD-10-CM | POA: Diagnosis not present

## 2016-09-15 DIAGNOSIS — R278 Other lack of coordination: Secondary | ICD-10-CM | POA: Diagnosis not present

## 2016-09-15 DIAGNOSIS — Z510 Encounter for antineoplastic radiation therapy: Secondary | ICD-10-CM | POA: Diagnosis not present

## 2016-09-15 NOTE — Therapy (Signed)
[facility] [address] [place], [place], [address] Phone: [phone]   Fax:  [phone]  Speech Language Pathology Treatment  Patient Details  Name: Crystal Parsons MRN: [id number] Date of Birth: 1946-07-16 Referring Provider: Dr. Delice Lesch  Encounter Date: 09/15/2016      End of Session - 09/15/16 1200    Visit Number 11   Number of Visits 17   Date for SLP Re-Evaluation 09/21/16   Authorization Type Pt also receiving chemo/rad tx - may interfere with ability to schedule tx consistently   SLP Start Time 1102   SLP Stop Time  1146   SLP Time Calculation (min) 44 min      Past Medical History:  Diagnosis Date  . Anxiety   . Bradycardia    At times with pulse in the 40s  . Brain cancer ([facility])    Glioblastoma  . Diarrhea    With blating, improved with gluten-free diet  . Diastolic dysfunction   . Essential hypertension, benign    Always has HTN when at the doctor's office.  . 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 Gout   . Hyperlipidemia   . Lynch syndrome   . Mild aortic sclerosis ([facility])   . Mitral valve problem    Mildly thickened mitral valve  . Mitral valve prolapse    Mild, anterior  . MR (mitral regurgitation)    ECHO 08/10/09 shows mild MR again and normal EF. No significant changes from prior ECHO.  Marland Kitchen Palpitations    Occasional, but are not significant. ECHO 08/20/08 - Normal EF (60%), mildly thickened mitral valve with mild anterior mitral valve prolapse, mild MR, mild aortic sclerosis, grade 1 diastolic dysfunction.  . Proteinuria    Likely secondary to Diabetes    Past Surgical History:  Procedure Laterality Date  . ABDOMINAL HYSTERECTOMY    . APPLICATION OF CRANIAL NAVIGATION Right 07/07/2016   Procedure: APPLICATION OF CRANIAL NAVIGATION;  Surgeon: Consuella Lose, MD;  Location: [facility];  Service: Neurosurgery;  Laterality: Right;  . BREAST LUMPECTOMY Left   .  CRANIOTOMY Right 07/07/2016   Procedure: CRANIOTOMY TUMOR EXCISION w/BrainLab;  Surgeon: Consuella Lose, MD;  Location: [facility];  Service: Neurosurgery;  Laterality: Right;  . HEMORROIDECTOMY  2009  . TUBAL LIGATION      There were no vitals filed for this visit.      Subjective Assessment - 09/15/16 1107    Subjective "I didn't do the homework it's on the dining table"               ADULT SLP TREATMENT - 09/15/16 1107      General Information   Behavior/Cognition Alert;Cooperative;Pleasant mood     Treatment Provided   Treatment provided Cognitive-Linquistic     Cognitive-Linquistic Treatment   Treatment focused on Cognition   Skilled Treatment Pt reports she went out for the 1st time to [employer] [facility] [place] and [place]. She enjoyed being out. Alternating attention, mildly complex functional math and attention to detail reading bank scedules and and answering time questions required occasional mod A for attention to details and usual mod A for simple time problems. Pt required usual mod A to ID errors. Alternating attention and recall of 3 rules for mildly complex card sort with frequent min questioning cues for 3 rules and occasional min A to altnerate correctly from cards in her hand to the sort stack.      Assessment / Recommendations / Plan  Plan Continue with current plan of care     Progression Toward Goals   Progression toward goals Goals downgraded            SLP Short Term Goals - 09/15/16 1158      SLP SHORT TERM GOAL #1   Title Pt will utilize external aids (notebook, timer) to manage schedule, lists, meds with rare min A over 2 sessions   Time 1   Period Weeks   Status Not Met     SLP SHORT TERM GOAL #2   Title Pt will selectively attend for 10 minutes in min noisy environment with a simple cognitive lingusitic task with 90% accuracy   Time 1   Period Weeks   Status Not Met     SLP SHORT TERM GOAL #3   Title Pt will verbalize 3 cognitive impairments  over 2 sessions with rare min A   Time 1   Period Weeks   Status Not Met     SLP SHORT TERM GOAL #4   Title Pt will perform mildly complex reasoning, functional math, attention to detail problems with 85% accuracy and occasional min A   Time 1   Period Weeks   Status Not Met          SLP Long Term Goals - 09/15/16 1158      SLP LONG TERM GOAL #1   Title Pt will demonstrate anticiaptory awareness identifying 3 errors on cognitive tasks with occasional min A   Time 3   Period Weeks   Status Revised     SLP LONG TERM GOAL #2   Title Pt will alternate attention between 2 simple cognitive lingusitic tasks with 85% on each and occasional min A   Time 3   Period Weeks   Status On-going     SLP LONG TERM GOAL #3   Title Pt will solve simplw reasoning, organizing and attention to detail problems with 85% accuracy and occasional min A   Time 3   Period Weeks   Status Revised          Plan - 09/15/16 1156    Clinical Impression Statement Pt continues to require mod A for attention, recall, error awareness and reasoning. I modified goals for pt success. Cotinue skilled ST to maximize cognition for improved independence and to reduce spouses burden.    Speech Therapy Frequency 2x / week   Treatment/Interventions Compensatory strategies;Functional tasks;Cueing hierarchy;Patient/family education;Multimodal communcation approach;Internal/external aids;SLP instruction and feedback;Cognitive reorganization;Environmental controls   Potential to Achieve Goals Fair   Potential Considerations Co-morbidities      Patient will benefit from skilled therapeutic intervention in order to improve the following deficits and impairments:   Cognitive communication deficit    Problem List Patient Active Problem List   Diagnosis Date Noted  . Thrombocytopenia ([facility]) 09/13/2016  . Depression, major, single episode, mild ([facility]) 09/13/2016  . Skin rash 08/30/2016  . Elevated liver enzymes  08/23/2016  . Anxiety attack 08/16/2016  . Other constipation 08/09/2016  . Malignant frontal lobe tumor ([facility])   . Polyposis syndrome, familial 07/20/2016  . H/O: hysterectomy 07/20/2016  . Leukocytosis   . Monocytosis   . Acute lower UTI   . GBM (glioblastoma multiforme) ([facility])   . 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 MR (mitral regurgitation)   . Brain tumor ([facility])   . Apraxia   . Left-sided weakness   . Steroid-induced hyperglycemia   . Leucocytosis   . Benign essential HTN   .  History of gout   . Dysuria   . Acute respiratory failure with hypoxia ([facility])   . Brain mass   . Seizure ([facility]) 06/30/2016  . Status epilepticus ([facility]) 06/30/2016    Deloras Reichard, Annye Rusk MS, CCC-SLP 09/15/2016, 12:01 PM  [facility] [address] [place], [place], [address] Phone: [phone]   Fax:  [phone]   Name: Crystal Parsons MRN: [id number] Date of Birth: 1945/10/12

## 2016-09-15 NOTE — Addendum Note (Signed)
Encounter addended by: Tyler Pita, MD on: 09/15/2016  5:08 PM<BR>    Actions taken: Problem List reviewed, Delete clinical note

## 2016-09-15 NOTE — Progress Notes (Signed)
  Radiation Oncology         [phone]   Name: Crystal Parsons MRN: [id number]   Date: 09/15/2016  DOB: 1946/01/30   Weekly Radiation Therapy Management    ICD-9-CM ICD-10-CM   1. GBM (glioblastoma multiforme) (HCC) 191.9 C71.9     Current Dose: 60 Gy  Planned Dose:  60 Gy  Narrative The patient presents for routine under treatment assessment.  Weight and vitals stable. Denies pain. Reports taking 2 mg dexamethasone bid on even numbered days and 2 mg once per day on odd numbered day. Scheduled to follow up with medical oncology on Wednesday for further dexamethasone taper. No evidence of thrush noted per nursing.  Set-up films were reviewed. The chart was checked.  Physical Findings  weight is 200 lb (90.7 kg). Her blood pressure is 121/53 (abnormal) and her pulse is 97. Her respiration is 18 and oxygen saturation is 100%.  Weight essentially stable. Alert and in no acute distress. Incision on scalp is well healed. No infection or drainage noted.   Impression The patient has tolerated radiation.  Plan The patient completed radiation therapy today. The patient will return for follow up in 1 month. I encouraged her to call the clinic with nay concerns she may have in the meantime.      Sheral Apley Tammi Klippel, M.D.  This document serves as a record of services personally performed by Tyler Pita, MD. It was created on his behalf by Maryla Morrow, a trained medical scribe. The creation of this record is based on the scribe's personal observations and the provider's statements to them. This document has been checked and approved by the attending provider.

## 2016-09-15 NOTE — Progress Notes (Signed)
[facility] Work  Clinical Social Work was referred by Futures trader for assessment of psychosocial needs, particularly symptoms of depression.  Clinical Social Worker attempted to contact patient by phone to schedule CSW office visit.  CSW left voicemail to return call.  Polo Riley, MSW, LCSW, OSW-C Clinical Social Worker [facility] [phone]

## 2016-09-15 NOTE — Progress Notes (Signed)
Weight and vitals stable. Denies pain. Reports taking 2 mg dex bid on even numbered days and 2 mg once per day on odd numbered day. Scheduled to follow up with medical oncology on Wednesday for further dex taper. No evidence of thrush noted. Confirmed she has stopped taking ativan and zofran. One month follow up appointment card given. Patient and husband both understand to contact this RN with future needs.   BP (!) 121/53 (BP Location: Left Arm, Patient Position: Sitting, Cuff Size: Normal)   Pulse 97   Resp 18   Wt 200 lb (90.7 kg)   SpO2 100%   BMI 35.43 kg/m  Wt Readings from Last 3 Encounters:  09/15/16 200 lb (90.7 kg)  09/12/16 198 lb 12.8 oz (90.2 kg)  09/09/16 199 lb 9.6 oz (90.5 kg)

## 2016-09-16 NOTE — Progress Notes (Signed)
  Radiation Oncology         [phone] ________________________________  Name: Crystal Parsons MRN: [id number]  Date: 09/15/2016  DOB: 02/27/1946  End of Treatment Note  Diagnosis: 71 y.o. woman with right frontal multifocal glioblastoma multiforme WHO grade IV  Indication for treatment:  Local Control       Radiation treatment dates: 08/02/16 - 09/15/16  Site/dose:    1. The initial enhancing tumor, peritumoral edema, plus 1 cm were treated to 44 Gy in 22 fractions. 2. The enhancing tumor plus 1 cm was boosted to 60 Gy with 8 additional fractions of 2 Gy.  Beams/energy:  1. The initial enhancing tumor, peritumoral edema, plus 1 cm were treated using helical intensity modulated radiotherapy delivering 6 megavolt photons. Image guidance was performed with megavoltage CT studies prior to each fraction. He was immobilized with a thermoplastic mask. 2. The enhancing tumor plus 1 cm was boosted using helical intensity modulated radiotherapy delivering 6 megavolt photons. Image guidance was performed with megavoltage CT studies prior to each fraction. She was immobilized with a a thermoplastic mask.  Narrative: The patient tolerated radiation treatment relatively well.  Some hair loss was noted in the treated area. She had occasional frontal headaches when she awoke in the morning for which she took Tylenol. She had slight dizziness on occasion, occasional confusion with time and days of the week, fatigue, and irritability. She denied difficulty finding words. She had normal long term memory. The patient took Zofran prior to radiation each day. The patient developed a rash on her chest and arms that resolved. The patient was on Decadron during treatment. The patient had erythema in the treatment area. The patient also had pruritus in the treatment area. We discussed Optune as a form of treatment down the road.  Plan: The patient has completed radiation treatment. The patient will return to  radiation oncology clinic for routine followup in one month. I advised her to call or return sooner if she has any questions or concerns related to her recovery or treatment. Will discuss Optune further in the future.  Steroids being managed by Dr. Alvy Bimler   ________________________________   Sheral Apley. Tammi Klippel, M.D.  This document serves as a record of services personally performed by Tyler Pita, MD. It was created on his behalf by Darcus Austin, a trained medical scribe. The creation of this record is based on the scribe's personal observations and the provider's statements to them. This document has been checked and approved by the attending provider.

## 2016-09-18 ENCOUNTER — Encounter (HOSPITAL_COMMUNITY): Payer: Self-pay | Admitting: *Deleted

## 2016-09-18 ENCOUNTER — Emergency Department (HOSPITAL_COMMUNITY)
Admission: EM | Admit: 2016-09-18 | Discharge: 2016-09-18 | Disposition: A | Discharge status: Home / Self Care | Payer: Medicare Other | Attending: Emergency Medicine | Admitting: Emergency Medicine

## 2016-09-18 ENCOUNTER — Emergency Department (HOSPITAL_COMMUNITY): Payer: Medicare Other

## 2016-09-18 DIAGNOSIS — R531 Weakness: Secondary | ICD-10-CM

## 2016-09-18 DIAGNOSIS — I10 Essential (primary) hypertension: Secondary | ICD-10-CM | POA: Insufficient documentation

## 2016-09-18 DIAGNOSIS — Z85841 Personal history of malignant neoplasm of brain: Secondary | ICD-10-CM | POA: Diagnosis not present

## 2016-09-18 DIAGNOSIS — N390 Urinary tract infection, site not specified: Secondary | ICD-10-CM

## 2016-09-18 DIAGNOSIS — Z91040 Latex allergy status: Secondary | ICD-10-CM | POA: Insufficient documentation

## 2016-09-18 DIAGNOSIS — Z79899 Other long term (current) drug therapy: Secondary | ICD-10-CM | POA: Diagnosis not present

## 2016-09-18 LAB — URINALYSIS, ROUTINE W REFLEX MICROSCOPIC
Bilirubin Urine: NEGATIVE
Glucose, UA: NEGATIVE mg/dL
KETONES UR: NEGATIVE mg/dL
Nitrite: POSITIVE — AB
PH: 5 (ref 5.0–8.0)
Protein, ur: NEGATIVE mg/dL
SPECIFIC GRAVITY, URINE: 1.018 (ref 1.005–1.030)

## 2016-09-18 LAB — COMPREHENSIVE METABOLIC PANEL
ALBUMIN: 3.4 g/dL — AB (ref 3.5–5.0)
ALK PHOS: 46 U/L (ref 38–126)
ALT: 45 U/L (ref 14–54)
AST: 26 U/L (ref 15–41)
Anion gap: 9 (ref 5–15)
BILIRUBIN TOTAL: 0.7 mg/dL (ref 0.3–1.2)
BUN: 21 mg/dL — AB (ref 6–20)
CALCIUM: 8.8 mg/dL — AB (ref 8.9–10.3)
CO2: 20 mmol/L — AB (ref 22–32)
Chloride: 115 mmol/L — ABNORMAL HIGH (ref 101–111)
Creatinine, Ser: 1.15 mg/dL — ABNORMAL HIGH (ref 0.44–1.00)
GFR calc Af Amer: 54 mL/min — ABNORMAL LOW (ref >60)
GFR calc non Af Amer: 47 mL/min — ABNORMAL LOW (ref >60)
Glucose, Bld: 91 mg/dL (ref 65–99)
Potassium: 3.4 mmol/L — ABNORMAL LOW (ref 3.5–5.1)
SODIUM: 144 mmol/L (ref 135–145)
TOTAL PROTEIN: 5.9 g/dL — AB (ref 6.5–8.1)

## 2016-09-18 LAB — CBC WITH DIFFERENTIAL/PLATELET
BASOS ABS: 0.1 10*3/uL (ref 0.0–0.1)
Basophils Relative: 1 %
EOS ABS: 0 10*3/uL (ref 0.0–0.7)
Eosinophils Relative: 0 %
HEMATOCRIT: 32.8 % — AB (ref 36.0–46.0)
HEMOGLOBIN: 11.1 g/dL — AB (ref 12.0–15.0)
LYMPHS PCT: 19 %
Lymphs Abs: 1.2 10*3/uL (ref 0.7–4.0)
MCH: 31.5 pg (ref 26.0–34.0)
MCHC: 33.8 g/dL (ref 30.0–36.0)
MCV: 93.2 fL (ref 78.0–100.0)
MONOS PCT: 8 %
Monocytes Absolute: 0.5 10*3/uL (ref 0.1–1.0)
NEUTROS PCT: 72 %
Neutro Abs: 4.4 10*3/uL (ref 1.7–7.7)
Platelets: 133 10*3/uL — ABNORMAL LOW (ref 150–400)
RBC: 3.52 MIL/uL — AB (ref 3.87–5.11)
RDW: 16.7 % — ABNORMAL HIGH (ref 11.5–15.5)
WBC: 6.2 10*3/uL (ref 4.0–10.5)

## 2016-09-18 MED ORDER — SODIUM CHLORIDE 0.9 % IV BOLUS (SEPSIS)
1000.0000 mL | Freq: Once | INTRAVENOUS | Status: AC
  Administered 2016-09-18: 1000 mL via INTRAVENOUS

## 2016-09-18 MED ORDER — FAMOTIDINE 20 MG PO TABS
20.0000 mg | ORAL_TABLET | Freq: Two times a day (BID) | ORAL | Status: DC
  Administered 2016-09-18: 20 mg via ORAL
  Filled 2016-09-18: qty 1

## 2016-09-18 MED ORDER — DEXAMETHASONE 4 MG PO TABS
6.0000 mg | ORAL_TABLET | Freq: Once | ORAL | Status: AC
  Administered 2016-09-18: 6 mg via ORAL
  Filled 2016-09-18: qty 1

## 2016-09-18 MED ORDER — LEVETIRACETAM 500 MG PO TABS
1000.0000 mg | ORAL_TABLET | Freq: Two times a day (BID) | ORAL | Status: DC
  Administered 2016-09-18: 1000 mg via ORAL
  Filled 2016-09-18: qty 2

## 2016-09-18 MED ORDER — CIPROFLOXACIN HCL 500 MG PO TABS: 500.0000 mg | ORAL_TABLET | Freq: Two times a day (BID) | ORAL | 0 refills | Status: DC

## 2016-09-18 MED ORDER — DEXAMETHASONE 2 MG PO TABS
2.0000 mg | ORAL_TABLET | Freq: Two times a day (BID) | ORAL | Status: DC
  Administered 2016-09-18: 2 mg via ORAL
  Filled 2016-09-18: qty 1

## 2016-09-18 NOTE — Discharge Instructions (Signed)
Stop taking your bactrim and begin taking the Cipro 2 x a day for the next 7 days. Take 4mg  of decadron 2 times a day until you see Dr. Alvy Bimler. Return for any new or worsening symptoms

## 2016-09-18 NOTE — ED Notes (Signed)
Patient is A & O x4.  She understood discharge instructions. 

## 2016-09-18 NOTE — ED Provider Notes (Signed)
[facility] DEPT Provider Note   CSN: [id number] Arrival date & time: 09/18/16  [id number]     History   Chief Complaint Chief Complaint  Patient presents with  . Altered Mental Status    HPI Crystal Parsons is a 71 y.o. female who presents emergency Department with chief complaint weakness. Patient is currently under treatment for glioblastoma multi-forming. She is status post frontal lobe resection on the right and has residual left-sided weakness. According to family. Yesterday, the patient rolled out of bed and had difficulty getting off the floor. This morning they found the patient on the floor and she had soiled herself with urine and stool. Patient will decided that she would sleep downstairs on the couch last night. She states that when she woke up she was unable to push herself up to sitting from the lying down position. She states that she rolled to the floor but when she got there was still unable to get herself up in the best settled herself. She denies any numbness or tingling in the saddle region. She denies any history of weakness. She's denies ataxia, or fevers. Once to her feet. The patient has been able to walk. Yesterday she spent the day, running errands and walking around shopping mall. Of note, the patient is on a Decadron taper 2 weeks ago she had her typical reduced from 4 mg twice a day to 2 mg twice a day and she alternates days between 2 mg twice a day and 2 mg 3 times a day. She denies orthostatic hypotension, soft, feelings of presyncope, urinary symptoms, nausea, vomiting, abdominal pain. She has no new neurologic deficits or complaints. She denies pain.  HPI  Past Medical History:  Diagnosis Date  . Anxiety   . Bradycardia    At times with pulse in the 40s  . Brain cancer ([place])    Glioblastoma  . Diarrhea    With blating, improved with gluten-free diet  . Diastolic dysfunction   . Essential hypertension, benign    Always has HTN when at the doctor's  office.  . Glioblastoma multiforme of brain ([place]) 07/11/2016  . Gout   . Hyperlipidemia   . Lynch syndrome   . Mild aortic sclerosis ([place])   . Mitral valve problem    Mildly thickened mitral valve  . Mitral valve prolapse    Mild, anterior  . MR (mitral regurgitation)    ECHO 08/10/09 shows mild MR again and normal EF. No significant changes from prior ECHO.  Marland Kitchen Palpitations    Occasional, but are not significant. ECHO 08/20/08 - Normal EF (60%), mildly thickened mitral valve with mild anterior mitral valve prolapse, mild MR, mild aortic sclerosis, grade 1 diastolic dysfunction.  . Proteinuria    Likely secondary to Diabetes    Patient Active Problem List   Diagnosis Date Noted  . Thrombocytopenia ([place]) 09/13/2016  . Depression, major, single episode, mild ([place]) 09/13/2016  . Skin rash 08/30/2016  . Elevated liver enzymes 08/23/2016  . Anxiety attack 08/16/2016  . Other constipation 08/09/2016  . Malignant frontal lobe tumor ([place])   . Polyposis syndrome, familial 07/20/2016  . H/O: hysterectomy 07/20/2016  . Leukocytosis   . Monocytosis   . Acute lower UTI   . GBM (glioblastoma multiforme) ([place])   . Glioblastoma multiforme of brain ([place]) 07/11/2016  . MR (mitral regurgitation)   . Brain tumor ([place])   . Apraxia   . Left-sided weakness   . Steroid-induced hyperglycemia   . Leucocytosis   .  Benign essential HTN   . History of gout   . Dysuria   . Acute respiratory failure with hypoxia ([place])   . Brain mass   . Seizure ([place]) 06/30/2016  . Status epilepticus ([place]) 06/30/2016    Past Surgical History:  Procedure Laterality Date  . ABDOMINAL HYSTERECTOMY    . APPLICATION OF CRANIAL NAVIGATION Right 07/07/2016   Procedure: APPLICATION OF CRANIAL NAVIGATION;  Surgeon: Consuella Lose, MD;  Location: [facility];  Service: Neurosurgery;  Laterality: Right;  . BREAST LUMPECTOMY Left   . CRANIOTOMY Right 07/07/2016   Procedure: CRANIOTOMY TUMOR EXCISION w/BrainLab;  Surgeon: Consuella Lose, MD;  Location: [facility];  Service: Neurosurgery;  Laterality: Right;  . HEMORROIDECTOMY  2009  . TUBAL LIGATION      OB History    No data available       Home Medications    Prior to Admission medications   Medication Sig Start Date End Date Taking? Authorizing Provider  Cholecalciferol (VITAMIN D) 2000 UNITS tablet Take 2,000 Units by mouth daily.    Historical Provider, MD  dexamethasone (DECADRON) 2 MG tablet Take 1 tablet (2 mg total) by mouth 2 (two) times daily. Patient taking differently: Take 1 mg by mouth 2 (two) times daily.  08/30/16   Heath Lark, MD  famotidine (PEPCID) 20 MG tablet Take 1 tablet (20 mg total) by mouth 2 (two) times daily. 08/16/16   Heath Lark, MD  levETIRAcetam (KEPPRA) 1000 MG tablet Take 1 tablet (1,000 mg total) by mouth 2 (two) times daily. 08/19/16   Ankit Lorie Phenix, MD  LORazepam (ATIVAN) 0.5 MG tablet Take 1 tablet (0.5 mg total) by mouth 2 (two) times daily as needed for anxiety. Patient not taking: Reported on 09/09/2016 08/16/16   Heath Lark, MD  losartan-hydrochlorothiazide (HYZAAR) 50-12.5 MG tablet Take 1 tablet by mouth daily. 07/21/16   Bary Leriche, PA-C  Multiple Vitamin (MULTIVITAMIN) tablet Take 1 tablet by mouth daily.    Historical Provider, MD  ondansetron (ZOFRAN) 8 MG tablet Take 1 tablet (8 mg total) by mouth every 8 (eight) hours as needed for nausea. 07/22/16   Heath Lark, MD  senna-docusate (SENOKOT-S) 8.6-50 MG tablet Take 2 tablets by mouth at bedtime. 07/21/16   Bary Leriche, PA-C  sulfamethoxazole-trimethoprim (BACTRIM DS,SEPTRA DS) 800-160 MG tablet Take 1 tablet by mouth 3 (three) times a week. 08/17/16   Heath Lark, MD  temozolomide (TEMODAR) 140 MG capsule Take 1 capsule (140 mg total) by mouth daily. May take on an empty stomach or at bedtime to decrease nausea & vomiting. 08/23/16   Heath Lark, MD    Family History Family History  Problem Relation Age of Onset  . Cancer Mother     lung cancer and colon ca  .  Colon cancer Mother   . Cancer Father   . Colon cancer Father 41  . Diabetes Father     Social History Social History  Substance Use Topics  . Smoking status: Never Smoker  . Smokeless tobacco: Never Used  . Alcohol use Yes     Comment: socially     Allergies   Accupril [quinapril hcl]; Latex; Vitamin e; Naproxen; and Zoloft [sertraline hcl]   Review of Systems Review of Systems  Ten systems reviewed and are negative for acute change, except as noted in the HPI.   Physical Exam Updated Vital Signs BP 135/64   Pulse 108   Temp 97.7 F (36.5 C) (Oral)   Resp 16  SpO2 94%   Physical Exam  Constitutional: She is oriented to person, place, and time. She appears well-developed and well-nourished. No distress.  HENT:  Head: Normocephalic and atraumatic.  Eyes: Conjunctivae and EOM are normal. Pupils are equal, round, and reactive to light. No scleral icterus.  Mild nystagmus at rest  Neck: Normal range of motion.  Cardiovascular: Normal rate, regular rhythm and normal heart sounds.  Exam reveals no gallop and no friction rub.   No murmur heard. Pulmonary/Chest: Effort normal and breath sounds normal. No respiratory distress.  Abdominal: Soft. Bowel sounds are normal. She exhibits no distension and no mass. There is no tenderness. There is no guarding.  Neurological: She is alert and oriented to person, place, and time.  Speech is clear and goal oriented, follows commands Major Cranial nerves without deficit, no facial droop 4/5 strength in upper and lower extremities  No proximal muscle weakness Strength R>L Sensation normal to light and sharp touch Moves extremities without ataxia, coordination intact Normal finger to nose and rapid alternating movements Neg romberg, no pronator drift Normal gait  Skin: Skin is warm and dry. She is not diaphoretic.  Nursing note and vitals reviewed.    ED Treatments / Results  Labs (all labs ordered are listed, but only  abnormal results are displayed) Labs Reviewed - No data to display  EKG  EKG Interpretation None       Radiology No results found.  Procedures Procedures (including critical care time)  Medications Ordered in ED Medications - No data to display   Initial Impression / Assessment and Plan / ED Course  I have reviewed the triage vital signs and the nursing notes.  Pertinent labs & imaging results that were available during my care of the patient were reviewed by me and considered in my medical decision making (see chart for details).  Clinical Course as of Sep 19 1615  Sun Sep 18, 2016  0750 Patient with new weakness I am considering adrenal insufficiency given her steroid taper. May have electrolyte abnormalities. I do not find any significant neurologic abnormalities other than her baseline changes from her brain tumor. On examination. She is not altered. She does not appear to be febrile, or have other symptoms of infection. She is well appearing. I'm awaiting labs at this time. Seen in shared visit with Dr. Deno Etienne.   [AH]  [phone] Orthostatic VS negative  [AH]    Clinical Course User Index [AH] Margarita Mail, PA-C    Patient appears to have a worsening urinary tract infection. We will send for culture and give her Cipro. She should stop the Bactrim. I have also spoken with Dr. Mitzi Hansen not. I feel this is likely secondary to the Dexter Decadron taper and will change her back to 4 mg twice a day until she sees Dr. course such on Wednesday of this coming week. Patient does not appear to have a neurologic abnormality. I did speak with Dr. Lurlean Horns, who was on call for neurology. He agrees that this is not assessed. Tibia. Neurologic abnormality. Labs and CT scans were reviewed without significant abnormality. She seems to be at her baseline mentally, as well as with her chronic left hemiparesis. Patient appears safe for discharge at this time. She has strong follow-up with her  outpatient care providers.  Final Clinical Impressions(s) / ED Diagnoses   Final diagnoses:  None    New Prescriptions New Prescriptions   No medications on file     Margarita Mail, PA-C  09/18/16 [employer], DO 09/21/16 1533

## 2016-09-18 NOTE — ED Triage Notes (Signed)
Family reports that pt fell yesterday and fell out of bed this morning; family reports that pt was crawling around on the floor and had been incontinent; family reports that this is not normal for pt and states "something is wrong"; pt has a hx of brain ca and had chemo and radiation on Thurs

## 2016-09-20 ENCOUNTER — Ambulatory Visit: Payer: Medicare Other | Admitting: Speech Pathology

## 2016-09-20 ENCOUNTER — Ambulatory Visit: Payer: Medicare Other | Admitting: Occupational Therapy

## 2016-09-20 ENCOUNTER — Other Ambulatory Visit: Payer: Self-pay | Admitting: Hematology and Oncology

## 2016-09-20 ENCOUNTER — Encounter: Payer: Self-pay | Admitting: Occupational Therapy

## 2016-09-20 DIAGNOSIS — G8194 Hemiplegia, unspecified affecting left nondominant side: Secondary | ICD-10-CM

## 2016-09-20 DIAGNOSIS — M6281 Muscle weakness (generalized): Secondary | ICD-10-CM

## 2016-09-20 DIAGNOSIS — R41841 Cognitive communication deficit: Secondary | ICD-10-CM

## 2016-09-20 DIAGNOSIS — R278 Other lack of coordination: Secondary | ICD-10-CM

## 2016-09-20 DIAGNOSIS — R41842 Visuospatial deficit: Secondary | ICD-10-CM

## 2016-09-20 DIAGNOSIS — R41844 Frontal lobe and executive function deficit: Secondary | ICD-10-CM | POA: Diagnosis not present

## 2016-09-20 MED ORDER — TEMOZOLOMIDE 140 MG PO CAPS: ORAL_CAPSULE | ORAL | 5 refills | Status: DC

## 2016-09-20 NOTE — Therapy (Signed)
[facility] [address] [place], [place], [address] Phone: [phone]   Fax:  [phone]  Speech Language Pathology Treatment  Patient Details  Name: Crystal Parsons MRN: [id number] Date of Birth: 1945/11/14 Referring Provider: Dr. Delice Lesch  Encounter Date: 09/20/2016      End of Session - 09/20/16 1206    Visit Number 12   Number of Visits 17   Date for SLP Re-Evaluation 09/21/16   SLP Start Time 1104   SLP Stop Time  1147   SLP Time Calculation (min) 43 min   Activity Tolerance Patient tolerated treatment well      Past Medical History:  Diagnosis Date  . Anxiety   . Bradycardia    At times with pulse in the 40s  . Brain cancer ([place])    Glioblastoma  . Diarrhea    With blating, improved with gluten-free diet  . Diastolic dysfunction   . Essential hypertension, benign    Always has HTN when at the doctor's office.  . Glioblastoma multiforme of brain ([facility]) 07/11/2016  . Gout   . Hyperlipidemia   . Lynch syndrome   . Mild aortic sclerosis ([place])   . Mitral valve problem    Mildly thickened mitral valve  . Mitral valve prolapse    Mild, anterior  . MR (mitral regurgitation)    ECHO 08/10/09 shows mild MR again and normal EF. No significant changes from prior ECHO.  Marland Kitchen Palpitations    Occasional, but are not significant. ECHO 08/20/08 - Normal EF (60%), mildly thickened mitral valve with mild anterior mitral valve prolapse, mild MR, mild aortic sclerosis, grade 1 diastolic dysfunction.  . Proteinuria    Likely secondary to Diabetes    Past Surgical History:  Procedure Laterality Date  . ABDOMINAL HYSTERECTOMY    . APPLICATION OF CRANIAL NAVIGATION Right 07/07/2016   Procedure: APPLICATION OF CRANIAL NAVIGATION;  Surgeon: Consuella Lose, MD;  Location: [facility];  Service: Neurosurgery;  Laterality: Right;  . BREAST LUMPECTOMY Left   . CRANIOTOMY Right 07/07/2016   Procedure: CRANIOTOMY TUMOR  EXCISION w/BrainLab;  Surgeon: Consuella Lose, MD;  Location: [facility];  Service: Neurosurgery;  Laterality: Right;  . HEMORROIDECTOMY  2009  . TUBAL LIGATION      There were no vitals filed for this visit.             ADULT SLP TREATMENT - 09/20/16 1135      General Information   Behavior/Cognition Alert;Cooperative;Pleasant mood     Treatment Provided   Treatment provided Cognitive-Linquistic     Cognitive-Linquistic Treatment   Treatment focused on Cognition   Skilled Treatment Spouse attended ST - reports pt went to ED with confusion/falls, difficulty weaning off of steriod and UTI, therefore increase in confusion.  Trained pt and spouse in use of journal/notebook to help orient pt to daily events/activities as well as use of wall clock/tear away calendar and writing down information pt is consistently confused about. Pt alternated attention and performed simple reasoning duirng card game/sort with occasional min verbal cues to attend to all of her cards and rare min questioning cues to have pt verbalize rules to assist her reasoning. Overall extended time for processing/reasoning.     Assessment / Recommendations / Plan   Plan Continue with current plan of care     Progression Toward Goals   Progression toward goals Progressing toward goals          SLP Education - 09/20/16 1203  Education provided Yes   Education Details environmental compensations for memory/confusion   Person(s) Educated Patient;Spouse   Methods Explanation;Demonstration;Handout   Comprehension Verbalized understanding          SLP Short Term Goals - 09/20/16 1206      SLP SHORT TERM GOAL #1   Title Pt will utilize external aids (notebook, timer) to manage schedule, lists, meds with rare min A over 2 sessions   Time 1   Period Weeks   Status Not Met     SLP SHORT TERM GOAL #2   Title Pt will selectively attend for 10 minutes in min noisy environment with a simple cognitive  lingusitic task with 90% accuracy   Time 1   Period Weeks   Status Not Met     SLP SHORT TERM GOAL #3   Title Pt will verbalize 3 cognitive impairments over 2 sessions with rare min A   Time 1   Period Weeks   Status Not Met     SLP SHORT TERM GOAL #4   Title Pt will perform mildly complex reasoning, functional math, attention to detail problems with 85% accuracy and occasional min A   Time 1   Period Weeks   Status Not Met          SLP Long Term Goals - 09/20/16 1206      SLP LONG TERM GOAL #1   Title Pt will demonstrate anticiaptory awareness identifying 3 errors on cognitive tasks with occasional min A   Time 2   Period Weeks   Status Revised     SLP LONG TERM GOAL #2   Title Pt will alternate attention between 2 simple cognitive lingusitic tasks with 85% on each and occasional min A   Time 2   Period Weeks   Status On-going     SLP LONG TERM GOAL #3   Title Pt will solve simplw reasoning, organizing and attention to detail problems with 85% accuracy and occasional min A   Time 2   Period Weeks   Status Revised          Plan - 09/20/16 1203    Clinical Impression Statement Pt went to ED over weekend due to fall, difficulty weaing from steriod and UTI. Pt with slower processing today - occasional min to mod A for simple reasoning, altenrating attention and recall. Spouse trained in environemental compensations/journal to reduce pt confusion and to reduce his burden of frequently orienting her daily events. Continue skilled ST to maximize congnition for independence.   Speech Therapy Frequency 2x / week   Treatment/Interventions Compensatory strategies;Functional tasks;Cueing hierarchy;Patient/family education;Multimodal communcation approach;Internal/external aids;SLP instruction and feedback;Cognitive reorganization;Environmental controls   Potential to Achieve Goals Fair   Potential Considerations Co-morbidities      Patient will benefit from skilled  therapeutic intervention in order to improve the following deficits and impairments:   Cognitive communication deficit    Problem List Patient Active Problem List   Diagnosis Date Noted  . Thrombocytopenia ([place]) 09/13/2016  . Depression, major, single episode, mild ([place]) 09/13/2016  . Skin rash 08/30/2016  . Elevated liver enzymes 08/23/2016  . Anxiety attack 08/16/2016  . Other constipation 08/09/2016  . Malignant frontal lobe tumor ([place])   . Polyposis syndrome, familial 07/20/2016  . H/O: hysterectomy 07/20/2016  . Leukocytosis   . Monocytosis   . Acute lower UTI   . GBM (glioblastoma multiforme) ([place])   . Glioblastoma multiforme of brain ([place]) 07/11/2016  . MR (mitral regurgitation)   .  Brain tumor ([place])   . Apraxia   . Left-sided weakness   . Steroid-induced hyperglycemia   . Leucocytosis   . Benign essential HTN   . History of gout   . Dysuria   . Acute respiratory failure with hypoxia ([place])   . Brain mass   . Seizure ([place]) 06/30/2016  . Status epilepticus ([place]) 06/30/2016    Jacarra Bobak, Annye Rusk MS, CCC-SLP 09/20/2016, 12:07 PM  [facility] [address] [place], [place], [address] Phone: [phone]   Fax:  [phone]   Name: Crystal Parsons MRN: [id number] Date of Birth: November 24, 1945

## 2016-09-20 NOTE — Patient Instructions (Signed)
   It's OK to use a tear away calendar near the sofa  Include in journal things to do, appointments, also visitors, lunch/dinner dates, pertinent information - Evamaria can write this - Big [employer]  Add sticky notes to calendar or make notes in journal about subjects that are repeatedly confusing to [facility] or questions she is asking over and over  Add in your journal chores to do, add in time to exercise

## 2016-09-20 NOTE — Therapy (Signed)
[facility] [address] [place], [place], [address] Phone: [phone]   Fax:  [phone]  Occupational Therapy Treatment  Patient Details  Name: Crystal Parsons MRN: [id number] Date of Birth: 08-15-1945 Referring Provider: Dr. Delice Lesch  Encounter Date: 09/20/2016      OT End of Session - 09/20/16 1255    Visit Number 10   Number of Visits 16   Date for OT Re-Evaluation 11/15/16  new goal added today and recertified   Authorization Type Medicare - will need G code and PN every 10th visit   Authorization Time Period 60 days   Authorization - Visit Number 10   Authorization - Number of Visits 20   OT Start Time 1147   OT Stop Time 1229   OT Time Calculation (min) 42 min   Activity Tolerance Patient tolerated treatment well      Past Medical History:  Diagnosis Date  . Anxiety   . Bradycardia    At times with pulse in the 40s  . Brain cancer ([place])    Glioblastoma  . Diarrhea    With blating, improved with gluten-free diet  . Diastolic dysfunction   . Essential hypertension, benign    Always has HTN when at the doctor's office.  . 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 Gout   . Hyperlipidemia   . Lynch syndrome   . Mild aortic sclerosis ([place])   . Mitral valve problem    Mildly thickened mitral valve  . Mitral valve prolapse    Mild, anterior  . MR (mitral regurgitation)    ECHO 08/10/09 shows mild MR again and normal EF. No significant changes from prior ECHO.  Marland Kitchen Palpitations    Occasional, but are not significant. ECHO 08/20/08 - Normal EF (60%), mildly thickened mitral valve with mild anterior mitral valve prolapse, mild MR, mild aortic sclerosis, grade 1 diastolic dysfunction.  . Proteinuria    Likely secondary to Diabetes    Past Surgical History:  Procedure Laterality Date  . ABDOMINAL HYSTERECTOMY    . APPLICATION OF CRANIAL NAVIGATION Right 07/07/2016   Procedure:  APPLICATION OF CRANIAL NAVIGATION;  Surgeon: Consuella Lose, MD;  Location: [facility];  Service: Neurosurgery;  Laterality: Right;  . BREAST LUMPECTOMY Left   . CRANIOTOMY Right 07/07/2016   Procedure: CRANIOTOMY TUMOR EXCISION w/BrainLab;  Surgeon: Consuella Lose, MD;  Location: [facility];  Service: Neurosurgery;  Laterality: Right;  . HEMORROIDECTOMY  2009  . TUBAL LIGATION      There were no vitals filed for this visit.      Subjective Assessment - 09/20/16 1238    Subjective  I had a really bad weekend (see epic medical notes)   Patient is accompained by: Family member  husband   Pertinent History see epic pt with R frontal mass x2 s/p crani starts radiation and chemo today;    Patient Stated Goals I want to feel like myself again   Currently in Pain? No/denies                      OT Treatments/Exercises (OP) - 09/20/16 0001      ADLs   ADL Comments Reviewed remaining LTG's - see goals for update. Pt also reports that she can't fully supinate LUE therefore additional goal added to POC and submitted to MD.  Pt in agreement with goal. Pt and husband report pt is currently consistently making breakfast and getting her own lunch;  helping with dinner. Discussed letting pt take the lead on making dinner and having supevision for safety issues when in the kitchen.      Cognitive Exercises   Other Cognitive Exercises 1 Reviewed structured schedule with pt and husband - reviewed purpose/rationale, details of schedule.  Also discussed types of cueing that husband can use with pt and discussed initiation as cognitive defict vs motivation.  Pt and husband both felt schedule would of assistance and have committed to following it for at least 2 weeks to determine effectiveness.                  OT Education - 09/20/16 1249    Education provided Yes   Education Details structured schedule   Person(s) Educated Patient;Spouse   Methods Explanation;Demonstration;Verbal  cues;Handout   Comprehension Verbalized understanding;Returned demonstration          OT Short Term Goals - 09/20/16 1250      OT SHORT TERM GOAL #1   Title Pt and husband will be mod I with HEP - 08/30/2106   Time 4   Period Weeks   Status Achieved     OT SHORT TERM GOAL #2   Title Pt will decrease time on 9 hold peg with L hand by at least 4 seconds (baseline = 38.25) to assist with fine motor tasks   Time 4   Period Weeks   Status Achieved  30.41 secs      OT SHORT TERM GOAL #3   Title Pt will require supervision for simple, familiar hot meal prep   Time 4   Period Weeks   Status Achieved     OT SHORT TERM GOAL #4   Title Pt will demonstate ability to follow 2-3 step instruction within context of functional, familar task.    Time 4   Period Weeks   Status Achieved     OT SHORT TERM GOAL #5   Title Pt will identify at least 2 strategies for ST memory    Time 4   Period Weeks   Status Achieved  ST memory strategies discussed will reinforce     OT SHORT TERM GOAL #6   Title Pt will require no more than 2 vc's for selective attention during functional familar tasks in a busy environment.    Time 4   Period Weeks   Status Achieved           OT Long Term Goals - 09/20/16 1251      OT LONG TERM GOAL #1   Title Pt and husband will be mod I with upgraded HEP prn - 09/27/2016   Time 8   Period Weeks   Status On-going     OT LONG TERM GOAL #2   Title Pt will decrease time on 9 hole peg test with L hand by at least 8 seconds (baseline=38.25) to assist with functional tasks.   Time 8   Period Weeks   Status Achieved  26.25     OT LONG TERM GOAL #3   Title Pt will increase grip strength by 5 pounds to assist with functional use of L, non dominant hand (baseline= 30 pounds)   Time 8   Period Weeks   Status Achieved  35 lbs. - dominant hand 40     OT LONG TERM GOAL #4   Title Pt will be mod I with cooking 2 items simultaneously for altenating attention and  safety with familiar hot meal prep.  Time 8   Period Weeks   Status On-going     OT LONG TERM GOAL #5   Title Pt will be mod I for laundry and simple home mgmt tasks.   Time 8   Period Weeks   Status On-going     OT LONG TERM GOAL #6   Title Pt will be able to set up med pill box with supervision only   Time 8   Period Weeks   Status Deferred  per husband, MD making mulitple med changes and pt and husband have asked that this goal be deferred at ths time     OT [place] #7   Title Pt will be mod I to attend to L during functional tasks.    Time 8   Period Weeks   Status Achieved     OT LONG TERM GOAL #8   Title Pt will demonstrate supination of LUE WFL's to hold pills in L hand.    Status New               Plan - 09/20/16 1252    Clinical Impression Statement Pt overall progressing toward goals. Pt with significant confusion this weekend and went to ED - MD feels  due to medication issues, dehydration and UTI.     Rehab Potential Good   OT Frequency 2x / week   OT Duration 8 weeks   OT Treatment/Interventions Self-care/ADL training;DME and/or AE instruction;Neuromuscular education;Therapeutic exercise;Therapeutic activities;Patient/family education;Visual/perceptual remediation/compensation;Cognitive remediation/compensation   Plan check on use of structured schedule, address possible gym activities, address supination of LUE.    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, unspecified hemiplegia type ([place]) - Plan: Ot plan of care cert/re-cert  Other lack of coordination - Plan: Ot plan of care cert/re-cert  Frontal lobe and executive function deficit - Plan: Ot plan of care cert/re-cert  Visuospatial deficit - Plan: Ot plan of care cert/re-cert      G-Codes - [id number]    Functional Assessment Tool Used skilled clinical observation, 9 hole peg, dynamometer   Functional Limitation Self care   Self Care Current Status [id number]) At least 20 percent but less than 40 percent impaired, limited or restricted   Self Care Goal Status [id number]) At least 1 percent but less than 20 percent impaired, limited or restricted      Problem List Patient Active Problem List   Diagnosis Date Noted  . Thrombocytopenia ([place]) 09/13/2016  . Depression, major, single episode, mild ([place]) 09/13/2016  . Skin rash 08/30/2016  . Elevated liver enzymes 08/23/2016  . Anxiety attack 08/16/2016  . Other constipation 08/09/2016  . 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 Polyposis syndrome, familial 07/20/2016  . H/O: hysterectomy 07/20/2016  . Leukocytosis   . Monocytosis   . Acute lower UTI   . GBM (glioblastoma multiforme) ([place])   . 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 MR (mitral regurgitation)   . Brain tumor ([place])   . Apraxia   . Left-sided weakness   . Steroid-induced hyperglycemia   . Leucocytosis   .  Benign essential HTN   . History of gout   . Dysuria   . Acute respiratory failure with hypoxia ([place])   . Brain mass   . Seizure ([place]) 06/30/2016  . Status epilepticus ([place]) 06/30/2016   Occupational Therapy Progress Note  Dates of Reporting Period: 08/02/2016 to 09/20/2016  Objective Reports of Subjective Statement: see above  Objective Measurements: see above  Goal Update: see above  Plan: see above  Reason Skilled Services are Required: see above  Quay Burow, OTR/L 09/20/2016, 1:00 PM  [facility] [address] [place], [place], [address] Phone: [phone]   Fax:  [phone]  Name: Crystal Parsons MRN: [id number] Date of Birth: 04/04/1946

## 2016-09-21 ENCOUNTER — Other Ambulatory Visit: Payer: Self-pay | Admitting: *Deleted

## 2016-09-21 ENCOUNTER — Ambulatory Visit (HOSPITAL_BASED_OUTPATIENT_CLINIC_OR_DEPARTMENT_OTHER): Payer: Medicare Other | Admitting: Hematology and Oncology

## 2016-09-21 ENCOUNTER — Other Ambulatory Visit (HOSPITAL_BASED_OUTPATIENT_CLINIC_OR_DEPARTMENT_OTHER): Payer: Medicare Other

## 2016-09-21 VITALS — BP 130/59 | HR 86 | Temp 98.4°F | Resp 17 | Ht 63.0 in | Wt 207.1 lb

## 2016-09-21 DIAGNOSIS — C719 Malignant neoplasm of brain, unspecified: Secondary | ICD-10-CM

## 2016-09-21 DIAGNOSIS — F320 Major depressive disorder, single episode, mild: Secondary | ICD-10-CM

## 2016-09-21 DIAGNOSIS — C711 Malignant neoplasm of frontal lobe: Secondary | ICD-10-CM

## 2016-09-21 DIAGNOSIS — R296 Repeated falls: Secondary | ICD-10-CM

## 2016-09-21 DIAGNOSIS — R531 Weakness: Secondary | ICD-10-CM

## 2016-09-21 DIAGNOSIS — N390 Urinary tract infection, site not specified: Secondary | ICD-10-CM | POA: Diagnosis not present

## 2016-09-21 LAB — URINE CULTURE: Culture: >=100000 — AB

## 2016-09-21 LAB — COMPREHENSIVE METABOLIC PANEL
ALBUMIN: 3.3 g/dL — AB (ref 3.5–5.0)
ALT: 46 U/L (ref 0–55)
ANION GAP: 10 meq/L (ref 3–11)
AST: 16 U/L (ref 5–34)
Alkaline Phosphatase: 52 U/L (ref 40–150)
BILIRUBIN TOTAL: 0.39 mg/dL (ref 0.20–1.20)
BUN: 16.6 mg/dL (ref 7.0–26.0)
CO2: 20 meq/L — AB (ref 22–29)
CREATININE: 0.7 mg/dL (ref 0.6–1.1)
Calcium: 9 mg/dL (ref 8.4–10.4)
Chloride: 114 mEq/L — ABNORMAL HIGH (ref 98–109)
EGFR: 81 mL/min/{1.73_m2} — ABNORMAL LOW (ref >90)
Glucose: 114 mg/dl (ref 70–140)
Potassium: 3.7 mEq/L (ref 3.5–5.1)
Sodium: 143 mEq/L (ref 136–145)
TOTAL PROTEIN: 5.9 g/dL — AB (ref 6.4–8.3)

## 2016-09-21 LAB — CBC WITH DIFFERENTIAL/PLATELET
BASO%: 0.7 % (ref 0.0–2.0)
Basophils Absolute: 0.1 10*3/uL (ref 0.0–0.1)
EOS%: 0.4 % (ref 0.0–7.0)
Eosinophils Absolute: 0 10*3/uL (ref 0.0–0.5)
HCT: 31.6 % — ABNORMAL LOW (ref 34.8–46.6)
HEMOGLOBIN: 10.7 g/dL — AB (ref 11.6–15.9)
LYMPH#: 1 10*3/uL (ref 0.9–3.3)
LYMPH%: 13.5 % — AB (ref 14.0–49.7)
MCH: 31.5 pg (ref 25.1–34.0)
MCHC: 33.9 g/dL (ref 31.5–36.0)
MCV: 92.9 fL (ref 79.5–101.0)
MONO#: 0.7 10*3/uL (ref 0.1–0.9)
MONO%: 9.7 % (ref 0.0–14.0)
NEUT%: 75.7 % (ref 38.4–76.8)
NEUTROS ABS: 5.7 10*3/uL (ref 1.5–6.5)
PLATELETS: 150 10*3/uL (ref 145–400)
RBC: 3.4 10*6/uL — AB (ref 3.70–5.45)
RDW: 17.2 % — AB (ref 11.2–14.5)
WBC: 7.6 10*3/uL (ref 3.9–10.3)

## 2016-09-21 MED ORDER — TEMOZOLOMIDE 140 MG PO CAPS: ORAL_CAPSULE | ORAL | 5 refills | Status: DC

## 2016-09-22 ENCOUNTER — Encounter: Payer: Self-pay | Admitting: Hematology and Oncology

## 2016-09-22 ENCOUNTER — Ambulatory Visit: Payer: Medicare Other | Admitting: Speech Pathology

## 2016-09-22 ENCOUNTER — Ambulatory Visit: Payer: Medicare Other | Admitting: Occupational Therapy

## 2016-09-22 ENCOUNTER — Encounter: Payer: Self-pay | Admitting: Occupational Therapy

## 2016-09-22 ENCOUNTER — Telehealth: Payer: Self-pay | Admitting: Emergency Medicine

## 2016-09-22 DIAGNOSIS — G8194 Hemiplegia, unspecified affecting left nondominant side: Secondary | ICD-10-CM | POA: Diagnosis not present

## 2016-09-22 DIAGNOSIS — R41844 Frontal lobe and executive function deficit: Secondary | ICD-10-CM | POA: Diagnosis not present

## 2016-09-22 DIAGNOSIS — R41841 Cognitive communication deficit: Secondary | ICD-10-CM

## 2016-09-22 DIAGNOSIS — R296 Repeated falls: Secondary | ICD-10-CM | POA: Insufficient documentation

## 2016-09-22 DIAGNOSIS — R278 Other lack of coordination: Secondary | ICD-10-CM

## 2016-09-22 DIAGNOSIS — M6281 Muscle weakness (generalized): Secondary | ICD-10-CM

## 2016-09-22 DIAGNOSIS — R41842 Visuospatial deficit: Secondary | ICD-10-CM | POA: Diagnosis not present

## 2016-09-22 NOTE — Assessment & Plan Note (Signed)
She has recent urinary tract infection. She was prescribed ciprofloxacin. I will recheck urinalysis next week.  Clinically, she is improving.

## 2016-09-22 NOTE — Therapy (Signed)
[facility] [address] [place], [place], [address] Phone: [phone]   Fax:  [phone]  Speech Language Pathology Treatment  Patient Details  Name: Crystal Parsons MRN: [id number] Date of Birth: 02/18/1946 Referring Provider: Dr. Delice Lesch  Encounter Date: 09/22/2016      End of Session - 09/22/16 1209    Visit Number 14   Number of Visits 17   Date for SLP Re-Evaluation 09/29/16   Authorization Type changed re eval date to add a week as pt did not receive ST week of 08/15/16      Past Medical History:  Diagnosis Date  . Anxiety   . Bradycardia    At times with pulse in the 40s  . Brain cancer ([place])    Glioblastoma  . Diarrhea    With blating, improved with gluten-free diet  . Diastolic dysfunction   . Essential hypertension, benign    Always has HTN when at the doctor's office.  . Glioblastoma multiforme of brain ([facility]) 07/11/2016  . Gout   . Hyperlipidemia   . Lynch syndrome   . Mild aortic sclerosis ([place])   . Mitral valve problem    Mildly thickened mitral valve  . Mitral valve prolapse    Mild, anterior  . MR (mitral regurgitation)    ECHO 08/10/09 shows mild MR again and normal EF. No significant changes from prior ECHO.  Marland Kitchen Palpitations    Occasional, but are not significant. ECHO 08/20/08 - Normal EF (60%), mildly thickened mitral valve with mild anterior mitral valve prolapse, mild MR, mild aortic sclerosis, grade 1 diastolic dysfunction.  . Proteinuria    Likely secondary to Diabetes    Past Surgical History:  Procedure Laterality Date  . ABDOMINAL HYSTERECTOMY    . APPLICATION OF CRANIAL NAVIGATION Right 07/07/2016   Procedure: APPLICATION OF CRANIAL NAVIGATION;  Surgeon: Consuella Lose, MD;  Location: [facility];  Service: Neurosurgery;  Laterality: Right;  . BREAST LUMPECTOMY Left   . CRANIOTOMY Right 07/07/2016   Procedure: CRANIOTOMY TUMOR EXCISION w/BrainLab;  Surgeon: Consuella Lose, MD;  Location: [facility];  Service: Neurosurgery;  Laterality: Right;  . HEMORROIDECTOMY  2009  . TUBAL LIGATION      There were no vitals filed for this visit.      Subjective Assessment - 09/22/16 1110    Subjective "She cooked dinner last night"   Patient is accompained by: Family member   Special Tests husband Jenny Reichmann               ADULT SLP TREATMENT - 09/22/16 1126      General Information   Behavior/Cognition Alert;Cooperative;Pleasant mood     Treatment Provided   Treatment provided Cognitive-Linquistic     Cognitive-Linquistic Treatment   Treatment focused on Cognition   Skilled Treatment Spouse attended ST session - facilitated error awareness and organization generating meal plan for 3 dinners with rare min A for errors and occasional min A for organization lists. Min questioning cues to ID missing ingridients. Ongoing instruction on carryover of structured schedule and use of journal/calendar     Assessment / Recommendations / [place] with current plan of care     Progression Toward Goals   Progression toward goals Progressing toward goals          SLP Education - 09/22/16 1156    Education provided Yes   Education Details continue to follow structured schedule, daily journal or use calendar as journal/to do  list   Person(s) Educated Patient;Spouse   Methods Explanation;Demonstration;Verbal cues   Comprehension Verbalized understanding          SLP Short Term Goals - 09/22/16 1202      SLP SHORT TERM GOAL #1   Title Pt will utilize external aids (notebook, timer) to manage schedule, lists, meds with rare min A over 2 sessions   Time 1   Period Weeks   Status Not Met     SLP SHORT TERM GOAL #2   Title Pt will selectively attend for 10 minutes in min noisy environment with a simple cognitive lingusitic task with 90% accuracy   Time 1   Period Weeks   Status Not Met     SLP SHORT TERM GOAL #3   Title Pt will verbalize 3  cognitive impairments over 2 sessions with rare min A   Time 1   Period Weeks   Status Not Met     SLP SHORT TERM GOAL #4   Title Pt will perform mildly complex reasoning, functional math, attention to detail problems with 85% accuracy and occasional min A   Time 1   Period Weeks   Status Not Met          SLP Long Term Goals - 09/22/16 1202      SLP LONG TERM GOAL #1   Title Pt will demonstrate anticiaptory awareness identifying 3 errors on cognitive tasks with occasional min A   Time 2   Period Weeks   Status Achieved     SLP LONG TERM GOAL #2   Title Pt will alternate attention between 2 simple cognitive lingusitic tasks with 85% on each and occasional min A   Time 2   Period Weeks   Status On-going     SLP LONG TERM GOAL #3   Title Pt will solve simplw reasoning, organizing and attention to detail problems with 85% accuracy and occasional min A   Time 2   Period Weeks   Status Revised          Plan - 09/22/16 1157    Clinical Impression Statement Pt with improvement in error awareness and attempt to correct errors, with rare min A. Occasional min to mod A to organize meal plan/menu list for 3 dinners. Pt continues to need encouragement to follow structured schedule.    Speech Therapy Frequency 2x / week   Treatment/Interventions Compensatory strategies;Functional tasks;Cueing hierarchy;Patient/family education;Multimodal communcation approach;Internal/external aids;SLP instruction and feedback;Cognitive reorganization;Environmental controls   Potential to [place] and Agree with Plan of Care Patient   Family Member Consulted spouse Jenny Reichmann      Patient will benefit from skilled therapeutic intervention in order to improve the following deficits and impairments:   Cognitive communication deficit    Problem List Patient Active Problem List   Diagnosis Date Noted  . Recurrent falls 09/22/2016  . Thrombocytopenia ([place]) 09/13/2016  .  Depression, major, single episode, mild ([place]) 09/13/2016  . Skin rash 08/30/2016  . Elevated liver enzymes 08/23/2016  . Anxiety attack 08/16/2016  . Other constipation 08/09/2016  . 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 Polyposis syndrome, familial 07/20/2016  . H/O: hysterectomy 07/20/2016  . Leukocytosis   . Monocytosis   . Acute lower UTI   . GBM (glioblastoma multiforme) ([place])   . Glioblastoma multiforme of brain ([place]) 07/11/2016  . MR (mitral regurgitation)   . Brain tumor ([place])   . Apraxia   . Left-sided weakness   .  Steroid-induced hyperglycemia   . Leucocytosis   . Benign essential HTN   . History of gout   . Dysuria   . Acute respiratory failure with hypoxia ([place])   . Brain mass   . Seizure ([place]) 06/30/2016  . Status epilepticus ([place]) 06/30/2016    Lonzo Saulter, Annye Rusk MS, CCC-SLP 09/22/2016, 12:10 PM  [facility] [address] [place], [place], [address] Phone: [phone]   Fax:  [phone]   Name: Crystal Parsons MRN: [id number] Date of Birth: 1946-02-06

## 2016-09-22 NOTE — Progress Notes (Signed)
[facility] OFFICE PROGRESS NOTE  Patient Care Team: John Griffin, MD as PCP - General (Internal Medicine)  SUMMARY OF ONCOLOGIC HISTORY:   Glioblastoma multiforme of brain (HCC)   06/30/2016 - 07/10/2016 Hospital Admission    The patient was admitted to the hospital after presentation with seizure. She was subsequently found to have brain tumor and underwent primary resection. She was subsequently discharged to rehabilitation facility      07/01/2016 Imaging    CT head showed mass lesion within the right frontal lobe measuring up to 4.8 cm. 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 However, a cerebral abscess may have a similar appearance. MRI with and without contrast is recommended for further characterization. 2. No significant mass effect or midline shift.  No hydrocephalus.      07/01/2016 Imaging    Ct chest showed right parahilar density appears to be atelectasis or possible infiltrate but no mass is identified. There is also streaky bibasilar subsegmental atelectasis and very small pleural effusions. 2. No mediastinal or hilar mass or adenopathy. Scattered lymph nodes are noted.      07/01/2016 Imaging    MRI brain showed Motion degraded examination. 3 x 4.3 x 4.5 cm complex RIGHT frontal lobe mass with imaging characteristics of primary brain tumor. 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 Local edema versus nonenhancing infiltrative tumor results in 2 mm RIGHT to LEFT midline shift. No ventricular entrapment.      07/07/2016 Imaging    Interval RIGHT craniotomy for resection of dominant RIGHT frontal lobe tumor without convincing evidence of residual local disease. Intraventricular extension of blood products without hydrocephalus. 2 mm RIGHT to LEFT midline shift without ventricular entrapment. Residual subcentimeter RIGHT frontal lobe satellite nodule consistent  with tumor.      07/07/2016 Surgery    Dr. Nundkumar performed stereotactic right frontal craniotomy for resection of tumor       07/07/2016 Pathology Results    Accession: [id number] Brain, for tumor resection, Right Frontal Lobe - GLIOBLASTOMA MULTIFORME WHO GRADE IV/IV. - SEE ONCOLOGY TABLE BELOW. Microscopic Comment ONCOLOGY TABLE - BRAIN AND SPINAL CORD 1. Procedure: Resection 2. Tumor site, including laterality: Right frontal lobe 3. Maximum tumor size (cm): At least 4.8 cm (gross measurement) 4. Histologic type: Glioblastoma multiforme 5. Grade: WHO grade IV/IV 6. Margins (if applicable): Can not be assessed 7. Ancillary studies: Per protocol, a block will be sent for MGMT and IDH1/2 testing and MGMT was positive      08/02/2016 - 09/14/2016 Radiation Therapy    She started concurrent chemo/RT      08/02/2016 - 09/14/2016 Chemotherapy    She started concurrent chemo/RT      09/18/2016 Imaging    Ct head showed no acute finding. History of glioblastoma with right frontal resection cavity and adjacent dense cellular tumor. No acute hemorrhage or significant mass effect       INTERVAL HISTORY: Please see below for problem oriented charting. Multiple events have happened since the last time I saw her. She presented to the emergency department due to multiple spells. On early Saturday morning, she fell and bumped her head.  She denies syncopal episode.  She was trying to get up to the bathroom that morning. It happened again on Sunday on 09/18/2016.  She was brought to the emergency department for further evaluation.  She fell off the bed because she was trying to   get up.  She had brief episode of urinary and fecal incontinence.  She had headache at that time. Imaging study in the emergency department showed no significant changes.  She denies further headaches, weakness, nausea or incontinence. The dexamethasone dose was increased to 4 mg twice a day.  She was also found to have  urinary tract infection and was prescribed ciprofloxacin.  Bactrim was discontinued. Since then, she is gradually improving. Her husband noted cognitive decline. She is still altering occasional inappropriate comments such as asking whether her husband was driving home from [place] and about her granddaughter who is currently residing in [place]. Mood wise, she denies major depression.  She was cooking for whole family recently and have good sense of well-being.  Both the patient had her husband had recently enrolled in local gym.  REVIEW OF SYSTEMS:   Constitutional: Denies fevers, chills or abnormal weight loss Eyes: Denies blurriness of vision Ears, nose, mouth, throat, and face: Denies mucositis or sore throat Respiratory: Denies cough, dyspnea or wheezes Cardiovascular: Denies palpitation, chest discomfort or lower extremity swelling Gastrointestinal:  Denies nausea, heartburn or change in bowel habits Skin: Denies abnormal skin rashes Lymphatics: Denies new lymphadenopathy or easy bruising Behavioral/Psych: Mood is stable, no new changes  All other systems were reviewed with the patient and are negative.  I have reviewed the past medical history, past surgical history, social history and family history with the patient and they are unchanged from previous note.  ALLERGIES:  is allergic to accupril [quinapril hcl]; latex; vitamin e; naproxen; and zoloft [sertraline hcl].  MEDICATIONS:  Current Outpatient Prescriptions  Medication Sig Dispense Refill  . acetaminophen (TYLENOL) 500 MG tablet Take 1,000 mg by mouth every 6 (six) hours as needed for mild pain or moderate pain.    . Cholecalciferol (VITAMIN D) 2000 UNITS tablet Take 2,000 Units by mouth daily.    . ciprofloxacin (CIPRO) 500 MG tablet Take 1 tablet (500 mg total) by mouth 2 (two) times daily. One po bid x 7 days 14 tablet 0  . dexamethasone (DECADRON) 2 MG tablet Take 1 tablet (2 mg total) by mouth 2 (two) times daily.  (Patient taking differently: Take 2 mg by mouth 2 (two) times daily. On odd days take 2 mg in am) 60 tablet 1  . famotidine (PEPCID) 20 MG tablet Take 1 tablet (20 mg total) by mouth 2 (two) times daily. 60 tablet 11  . levETIRAcetam (KEPPRA) 1000 MG tablet Take 1 tablet (1,000 mg total) by mouth 2 (two) times daily. 60 tablet 0  . Multiple Vitamin (MULTIVITAMIN) tablet Take 1 tablet by mouth daily.    . senna-docusate (SENOKOT-S) 8.6-50 MG tablet Take 2 tablets by mouth at bedtime. 100 tablet 0  . LORazepam (ATIVAN) 0.5 MG tablet Take 1 tablet (0.5 mg total) by mouth 2 (two) times daily as needed for anxiety. (Patient not taking: Reported on 09/18/2016) 60 tablet 0  . ondansetron (ZOFRAN) 8 MG tablet Take 1 tablet (8 mg total) by mouth every 8 (eight) hours as needed for nausea. (Patient not taking: Reported on 09/18/2016) 60 tablet 3  . sulfamethoxazole-trimethoprim (BACTRIM DS,SEPTRA DS) 800-160 MG tablet Take 1 tablet by mouth 3 (three) times a week. (Patient not taking: Reported on 09/21/2016) 60 tablet 11  . temozolomide (TEMODAR) 140 MG capsule Take 2 caps daily for 5 days, every 28 days 10 capsule 5   No current facility-administered medications for this visit.     PHYSICAL EXAMINATION: ECOG PERFORMANCE STATUS: 2 -   Symptomatic, <50% confined to bed  Vitals:   09/21/16 1032  BP: (!) 130/59  Pulse: 86  Resp: 17  Temp: 98.4 F (36.9 C)   Filed Weights   09/21/16 1032  Weight: 207 lb 1.6 oz (93.9 kg)    GENERAL:alert, no distress and comfortable.  She appears morbidly obese and cushingoid SKIN: skin color, texture, turgor are normal, no rashes or significant lesions EYES: normal, Conjunctiva are pink and non-injected, sclera clear Musculoskeletal:no cyanosis of digits and no clubbing  NEURO: alert & oriented x 3 with fluent speech, no focal motor/sensory deficits  LABORATORY DATA:  I have reviewed the data as listed    Component Value Date/Time   NA 143 09/21/2016 1003   K  3.7 09/21/2016 1003   CL 115 (H) 09/18/2016 0800   CO2 20 (L) 09/21/2016 1003   GLUCOSE 114 09/21/2016 1003   BUN 16.6 09/21/2016 1003   CREATININE 0.7 09/21/2016 1003   CALCIUM 9.0 09/21/2016 1003   PROT 5.9 (L) 09/21/2016 1003   ALBUMIN 3.3 (L) 09/21/2016 1003   AST 16 09/21/2016 1003   ALT 46 09/21/2016 1003   ALKPHOS 52 09/21/2016 1003   BILITOT 0.39 09/21/2016 1003   GFRNONAA 47 (L) 09/18/2016 0800   GFRAA 54 (L) 09/18/2016 0800    No results found for: SPEP, UPEP  Lab Results  Component Value Date   WBC 7.6 09/21/2016   NEUTROABS 5.7 09/21/2016   HGB 10.7 (L) 09/21/2016   HCT 31.6 (L) 09/21/2016   MCV 92.9 09/21/2016   PLT 150 09/21/2016      Chemistry      Component Value Date/Time   NA 143 09/21/2016 1003   K 3.7 09/21/2016 1003   CL 115 (H) 09/18/2016 0800   CO2 20 (L) 09/21/2016 1003   BUN 16.6 09/21/2016 1003   CREATININE 0.7 09/21/2016 1003      Component Value Date/Time   CALCIUM 9.0 09/21/2016 1003   ALKPHOS 52 09/21/2016 1003   AST 16 09/21/2016 1003   ALT 46 09/21/2016 1003   BILITOT 0.39 09/21/2016 1003       RADIOGRAPHIC STUDIES: I have personally reviewed the radiological images as listed and agreed with the findings in the report. Ct Head Wo Contrast  Result Date: 09/18/2016 CLINICAL DATA:  Weakness. EXAM: CT HEAD WITHOUT CONTRAST TECHNIQUE: Contiguous axial images were obtained from the base of the skull through the vertex without intravenous contrast. COMPARISON:  06/30/2016 head CT.  Postoperative brain MRI 07/07/2016 FINDINGS: Brain: Expected appearance of resection cavity in the right frontal lobe with neutral mass effect. The periphery has calcification and mildly dense material correlating with blood products on previous MRI. More posteriorly is a hazy area of high density which may have progressed from prior CT, most consistent with cellular tumor in this patient with history of glioblastoma. No evidence of acute infarct, hemorrhage, or  hydrocephalus. There is no midline shift or herniation. Moderate white matter disease. Vascular: Atherosclerosis.  No hyperdense vessel. Skull: Expected changes of right frontal craniotomy. Sinuses/Orbits: Minimal mucus in the left sphenoid sinus. IMPRESSION: 1. No acute finding. 2. History of glioblastoma with right frontal resection cavity and adjacent dense cellular tumor. No acute hemorrhage or significant mass effect. Electronically Signed   By: Monte Fantasia M.D.   On: 09/18/2016 11:09    ASSESSMENT & PLAN:  Glioblastoma multiforme of brain [facility]) She has completed concurrent chemoradiation treatment. She had a recent CT imaging study done in the emergency department which showed  no significant acute changes. We discussed the importance of waiting for the brain swelling to subside before repeating MRI. She will begin maintenance Temodar around middle of March, approximately 4 weeks from completion of chemoradiation treatment. I will continue to see her on a regular basis for supportive care. We discussed dexamethasone taper. The dose of dexamethasone was recently increased due to changes in mental status. I suspect the mental status change could be related to urinary tract infection. I recommend reducing the dose of dexamethasone to 4 mg in the morning and 2 mg in the afternoon.  I plan for further taper next week when I see her back. She will resume Bactrim for PCP prophylaxis next week after she completes antibiotic therapy for urinary tract infection  Depression, major, single episode, mild (HCC) I would try to get social worker to talk to her again. Mood wise, it is stable. She felt good because she was able to cook for her family recently. She has enroll into a gym recently.  I encouraged her to increase activity as tolerated which I believe has helped with her mood tremendously.  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  She  will continue physical therapy. Recent CT scan is reassuring without major changes  Acute lower UTI She has recent urinary tract infection. She was prescribed ciprofloxacin. I will recheck urinalysis next week.  Clinically, she is improving.  Recurrent falls She had recurrent falls at home, not precipitated by any syncopal event. Nevertheless, I have discontinued one of her blood pressure medication. Family members have placed chairs around her bed to prevent her from rolling off the bed. We discussed fall precautions including adequate lighting and remove clutter away from her. I also recommend the patient to call family members for help if of need of assistance to get to the bathroom   Orders Placed This Encounter  Procedures  . Urine culture    Standing Status:   Future    Standing Expiration Date:   10/26/2017  . Urinalysis, Microscopic - [facility]    Standing Status:   Future    Standing Expiration Date:   10/26/2017   All questions were answered. The patient knows to call the clinic with any problems, questions or concerns. No barriers to learning was detected. I spent 30 minutes counseling the patient face to face. The total time spent in the appointment was 40 minutes and more than 50% was on counseling and review of test results      , MD 09/22/2016 7:50 AM  

## 2016-09-22 NOTE — Assessment & Plan Note (Signed)
She has completed concurrent chemoradiation treatment. She had a recent CT imaging study done in the emergency department which showed no significant acute changes. We discussed the importance of waiting for the brain swelling to subside before repeating MRI. She will begin maintenance Temodar around middle of March, approximately 4 weeks from completion of chemoradiation treatment. I will continue to see her on a regular basis for supportive care. We discussed dexamethasone taper. The dose of dexamethasone was recently increased due to changes in mental status. I suspect the mental status change could be related to urinary tract infection. I recommend reducing the dose of dexamethasone to 4 mg in the morning and 2 mg in the afternoon.  I plan for further taper next week when I see her back. She will resume Bactrim for PCP prophylaxis next week after she completes antibiotic therapy for urinary tract infection

## 2016-09-22 NOTE — Patient Instructions (Signed)
  Organize meal list into a grocery list - combine produce, canned goods, dry goods, seasonings Add how much you need of each - (ie: 4 onions, 5 lbs of ground round etc) Check off the ones you already have at home and don't need to buy  Write in calendar daily events, appointments,  meals, chores, days you are going to cook - also add any meals out, visitors, birthdays  Keep following your schedule to keep motivated and getting better

## 2016-09-22 NOTE — Telephone Encounter (Signed)
Post ED Visit - Positive Culture Follow-up  Culture report reviewed by antimicrobial stewardship pharmacist:  []  Elenor Quinones, Pharm.D. []  Heide Guile, Pharm.D., BCPS []  Parks Neptune, Pharm.D. [x]  Alycia Rossetti, Pharm.D., BCPS []  Ullin, Pharm.D., BCPS, AAHIVP []  Legrand Como, Pharm.D., BCPS, AAHIVP []  Milus Glazier, Pharm.D. []  Stephens November, [place].D.  Positive urine culture Treated with ciprofloxacin, organism sensitive to the same and no further patient follow-up is required at this time.  Hazle Nordmann 09/22/2016, 11:48 AM

## 2016-09-22 NOTE — Assessment & Plan Note (Signed)
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  She will continue physical therapy. Recent CT scan is reassuring without major changes

## 2016-09-22 NOTE — Therapy (Signed)
[facility] [address] [place], [place], [address] Phone: [phone]   Fax:  [phone]  Occupational Therapy Treatment  Patient Details  Name: Crystal Parsons MRN: [id number] Date of Birth: [birth year] Referring Provider: Dr. Delice Lesch  Encounter Date: 09/22/2016      OT End of Session - 09/22/16 1748    Visit Number 11   Number of Visits 16   Date for OT Re-Evaluation 11/15/16   Authorization Type Medicare - will need G code and PN every 10th visit   Authorization Time Period 60 days   Authorization - Visit Number 11   Authorization - Number of Visits 20   OT Start Time [id number]   OT Stop Time 1620   OT Time Calculation (min) 48 min   Activity Tolerance Patient tolerated treatment well      Past Medical History:  Diagnosis Date  . Anxiety   . Bradycardia    At times with pulse in the 40s  . Brain cancer ([place])    Glioblastoma  . Diarrhea    With blating, improved with gluten-free diet  . Diastolic dysfunction   . Essential hypertension, benign    Always has HTN when at the doctor's office.  . Glioblastoma multiforme of brain ([place]) 07/11/2016  . Gout   . Hyperlipidemia   . Lynch syndrome   . 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 Mitral valve problem    Mildly thickened mitral valve  . Mitral valve prolapse    Mild, anterior  . MR (mitral regurgitation)    ECHO 08/10/09 shows mild MR again and normal EF. No significant changes from prior ECHO.  Marland Kitchen Palpitations    Occasional, but are not significant. ECHO 08/20/08 - Normal EF (60%), mildly thickened mitral valve with mild anterior mitral valve prolapse, mild MR, mild aortic sclerosis, grade 1 diastolic dysfunction.  . Proteinuria    Likely secondary to Diabetes    Past Surgical History:  Procedure Laterality Date  . ABDOMINAL HYSTERECTOMY    . APPLICATION OF CRANIAL NAVIGATION Right 07/07/2016   Procedure: APPLICATION OF CRANIAL NAVIGATION;  Surgeon:  Consuella Lose, MD;  Location: [facility];  Service: Neurosurgery;  Laterality: Right;  . BREAST LUMPECTOMY Left   . CRANIOTOMY Right 07/07/2016   Procedure: CRANIOTOMY TUMOR EXCISION w/BrainLab;  Surgeon: Consuella Lose, MD;  Location: [facility];  Service: Neurosurgery;  Laterality: Right;  . HEMORROIDECTOMY  2009  . TUBAL LIGATION      There were no vitals filed for this visit.      Subjective Assessment - 09/22/16 1537    Subjective  I signed up for a personal trainer today for exercise.    Patient is accompained by: Family member  husband in waiting room   Pertinent History see epic pt with R frontal mass x2 s/p crani starts radiation and chemo today;    Patient Stated Goals I want to feel like myself again   Currently in Pain? No/denies                      OT Treatments/Exercises (OP) - 09/22/16 0001      ADLs   ADL Comments Pt to begin wellness exerise program - given cognitive deficits pt will need safety recommendations.  Pt is also presenting with much more significant SOB.  Started with walk on outdoor level surfaces - pt with extremely slow gait speed and only able to walk approximately 4 minutes with resulting increased  SOB, HR at 130, and rated fatigue at 7/10.  Reassessed pt's balance and current BERG score 45/56.  Discussed findings with husband and pt as this appears to be a decline since pt was discharged from PT.  After discussion pt and husband have decided to continue with personal trainor however understand that if pt is unable to tolerate personal trainor they can request new PT order to assist pt in improving issues to allow her to participated with personal trainer                   OT Short Term Goals - 09/22/16 1746      OT SHORT TERM GOAL #1   Title Pt and husband will be mod I with HEP - 08/30/2106   Time 4   Period Weeks   Status Achieved     OT SHORT TERM GOAL #2   Title Pt will decrease time on 9 hold peg with L hand by at least  4 seconds (baseline = 38.25) to assist with fine motor tasks   Time 4   Period Weeks   Status Achieved  30.41 secs      OT SHORT TERM GOAL #3   Title Pt will require supervision for simple, familiar hot meal prep   Time 4   Period Weeks   Status Achieved     OT SHORT TERM GOAL #4   Title Pt will demonstate ability to follow 2-3 step instruction within context of functional, familar task.    Time 4   Period Weeks   Status Achieved     OT SHORT TERM GOAL #5   Title Pt will identify at least 2 strategies for ST memory    Time 4   Period Weeks   Status Achieved  ST memory strategies discussed will reinforce     OT SHORT TERM GOAL #6   Title Pt will require no more than 2 vc's for selective attention during functional familar tasks in a busy environment.    Time 4   Period Weeks   Status Achieved           OT Long Term Goals - 09/22/16 1747      OT LONG TERM GOAL #1   Title Pt and husband will be mod I with upgraded HEP prn - 09/27/2016   Time 8   Period Weeks   Status On-going     OT LONG TERM GOAL #2   Title Pt will decrease time on 9 hole peg test with L hand by at least 8 seconds (baseline=38.25) to assist with functional tasks.   Time 8   Period Weeks   Status Achieved  26.25     OT LONG TERM GOAL #3   Title Pt will increase grip strength by 5 pounds to assist with functional use of L, non dominant hand (baseline= 30 pounds)   Time 8   Period Weeks   Status Achieved  35 lbs. - dominant hand 40     OT LONG TERM GOAL #4   Title Pt will be mod I with cooking 2 items simultaneously for altenating attention and safety with familiar hot meal prep.    Time 8   Period Weeks   Status On-going     OT LONG TERM GOAL #5   Title Pt will be mod I for laundry and simple home mgmt tasks.   Time 8   Period Weeks   Status On-going     OT LONG  TERM GOAL #6   Title Pt will be able to set up med pill box with supervision only   Time 8   Period Weeks   Status  Deferred  per husband, MD making mulitple med changes and pt and husband have asked that this goal be deferred at ths time     OT [place] #7   Title Pt will be mod I to attend to L during functional tasks.    Time 8   Period Weeks   Status Achieved     OT LONG TERM GOAL #8   Title Pt will demonstrate supination of LUE WFL's to hold pills in L hand.    Status On-going               Plan - 09/22/16 1747    Clinical Impression Statement Pt overall progressing toward goals. Recommeded that pt not use treadmill at gym given distractability and balance issues.    Rehab Potential Good   OT Frequency 2x / week   OT Duration 8 weeks   OT Treatment/Interventions Self-care/ADL training;DME and/or AE instruction;Neuromuscular education;Therapeutic exercise;Therapeutic activities;Patient/family education;Visual/perceptual remediation/compensation;Cognitive remediation/compensation   Plan check on use of structured schedule, address supination of LUE   Consulted and Agree with Plan of Care Patient;Family member/caregiver   Family Member Consulted husband      Patient will benefit from skilled therapeutic intervention in order to improve the following deficits and impairments:  Decreased activity tolerance, Decreased coordination, Decreased cognition, Decreased safety awareness, Decreased strength, Impaired UE functional use, Impaired vision/preception  Visit Diagnosis: Muscle weakness (generalized)  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 Recurrent falls 09/22/2016  . Thrombocytopenia ([place]) 09/13/2016  . Depression, major, single episode, mild ([place]) 09/13/2016  . Skin rash 08/30/2016  . Elevated liver enzymes 08/23/2016  . Anxiety attack 08/16/2016  . Other constipation 08/09/2016  .  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 Polyposis syndrome, familial 07/20/2016  . H/O: hysterectomy 07/20/2016  . Leukocytosis   . Monocytosis   . Acute lower UTI   . GBM (glioblastoma multiforme) ([place])   . Glioblastoma multiforme of brain ([place]) 07/11/2016  . MR (mitral regurgitation)   . Brain tumor ([place])   . Apraxia   . Left-sided weakness   . Steroid-induced hyperglycemia   . Leucocytosis   . Benign essential HTN   . History of gout   . Dysuria   . Acute respiratory failure with hypoxia ([place])   . Brain mass   . Seizure ([place]) 06/30/2016  . Status epilepticus ([place]) 06/30/2016    Quay Burow, OTR/L 09/22/2016, 5:49 PM  [facility] [address] [place], [place], [address] Phone: [phone]   Fax:  [phone]  Name: GEMA ACOMB MRN: [id number] Date of Birth: November 20, 1945

## 2016-09-22 NOTE — Assessment & Plan Note (Signed)
She had recurrent falls at home, not precipitated by any syncopal event. Nevertheless, I have discontinued one of her blood pressure medication. Family members have placed chairs around her bed to prevent her from rolling off the bed. We discussed fall precautions including adequate lighting and remove clutter away from her. I also recommend the patient to call family members for help if of need of assistance to get to the bathroom

## 2016-09-22 NOTE — Assessment & Plan Note (Signed)
I would try to get social worker to talk to her again. Mood wise, it is stable. She felt good because she was able to cook for her family recently. She has enroll into a gym recently.  I encouraged her to increase activity as tolerated which I believe has helped with her mood tremendously.

## 2016-09-26 ENCOUNTER — Encounter: Payer: Self-pay | Admitting: *Deleted

## 2016-09-26 MED FILL — TEMOZOLOMIDE 140 MG CAPSULE: 140 | 28 days supply | Qty: 10 | Fill #0

## 2016-09-26 NOTE — Progress Notes (Signed)
Crystal Parsons  Clinical Social Parsons was referred by Futures trader for assessment of psychosocial needs.  Clinical Social Worker met with patient in [place] office for counseling session.    [name] briefly shared her understanding of her disease, identified her support system and areas of strengths, and expressed her greatest concerns at this time.  CSW and patient explored feelings of anger, grief, fear, and frustration.  She reported feeling irritable and experience depressed mood, fatigue, and change in sleep (sleeping up to 16 hours/day).  CSW educated patient on symptoms of depression, including symptoms listed. [name] shared she is currently on an anti-depressant, and feels "it helps some of the time".  CSW reiterated importance of talk therapy/supportive interventions paired with medication management.    CSW encouraged patient to identify positive coping skills she currently exhibits to provide support during this time- this included cooking, knitting, prayer/relying on God, and spending time with her daughters.  CSW introduced new coping strategies, such as journaling, meditation, art classes, and exercise program with other cancer survivors.  Patient scheduled follow up session with CSW on 10/18/16.    Polo Riley, MSW, LCSW, OSW-C Clinical Social Worker [facility] [phone]

## 2016-09-27 ENCOUNTER — Emergency Department (HOSPITAL_COMMUNITY): Payer: Medicare Other

## 2016-09-27 ENCOUNTER — Ambulatory Visit: Payer: Medicare Other | Admitting: Occupational Therapy

## 2016-09-27 ENCOUNTER — Encounter (HOSPITAL_COMMUNITY): Payer: Self-pay | Admitting: Emergency Medicine

## 2016-09-27 ENCOUNTER — Ambulatory Visit: Payer: Medicare Other | Admitting: Speech Pathology

## 2016-09-27 ENCOUNTER — Encounter: Payer: Self-pay | Admitting: Occupational Therapy

## 2016-09-27 ENCOUNTER — Emergency Department (HOSPITAL_COMMUNITY)
Admission: EM | Admit: 2016-09-27 | Discharge: 2016-09-27 | Disposition: A | Discharge status: Home / Self Care | Payer: Medicare Other | Attending: Emergency Medicine | Admitting: Emergency Medicine

## 2016-09-27 DIAGNOSIS — N390 Urinary tract infection, site not specified: Secondary | ICD-10-CM | POA: Diagnosis not present

## 2016-09-27 DIAGNOSIS — R410 Disorientation, unspecified: Secondary | ICD-10-CM | POA: Diagnosis not present

## 2016-09-27 DIAGNOSIS — Z85841 Personal history of malignant neoplasm of brain: Secondary | ICD-10-CM | POA: Diagnosis not present

## 2016-09-27 DIAGNOSIS — I10 Essential (primary) hypertension: Secondary | ICD-10-CM | POA: Insufficient documentation

## 2016-09-27 DIAGNOSIS — R4182 Altered mental status, unspecified: Secondary | ICD-10-CM | POA: Insufficient documentation

## 2016-09-27 DIAGNOSIS — Z91040 Latex allergy status: Secondary | ICD-10-CM | POA: Diagnosis not present

## 2016-09-27 LAB — URINALYSIS, ROUTINE W REFLEX MICROSCOPIC
Bilirubin Urine: NEGATIVE
GLUCOSE, UA: NEGATIVE mg/dL
Hgb urine dipstick: NEGATIVE
Ketones, ur: NEGATIVE mg/dL
Leukocytes, UA: NEGATIVE
Nitrite: POSITIVE — AB
PROTEIN: NEGATIVE mg/dL
Specific Gravity, Urine: 1.016 (ref 1.005–1.030)
pH: 5 (ref 5.0–8.0)

## 2016-09-27 LAB — COMPREHENSIVE METABOLIC PANEL
ALBUMIN: 4 g/dL (ref 3.5–5.0)
ALT: 41 U/L (ref 14–54)
AST: 20 U/L (ref 15–41)
Alkaline Phosphatase: 50 U/L (ref 38–126)
Anion gap: 7 (ref 5–15)
BILIRUBIN TOTAL: 0.4 mg/dL (ref 0.3–1.2)
BUN: 24 mg/dL — AB (ref 6–20)
CO2: 24 mmol/L (ref 22–32)
Calcium: 8.9 mg/dL (ref 8.9–10.3)
Chloride: 108 mmol/L (ref 101–111)
Creatinine, Ser: 0.87 mg/dL (ref 0.44–1.00)
GFR calc Af Amer: >60 mL/min (ref >60)
GFR calc non Af Amer: >60 mL/min (ref >60)
GLUCOSE: 106 mg/dL — AB (ref 65–99)
POTASSIUM: 3.6 mmol/L (ref 3.5–5.1)
Sodium: 139 mmol/L (ref 135–145)
TOTAL PROTEIN: 6.6 g/dL (ref 6.5–8.1)

## 2016-09-27 LAB — CBC WITH DIFFERENTIAL/PLATELET
BASOS ABS: 0 10*3/uL (ref 0.0–0.1)
BASOS PCT: 0 %
Eosinophils Absolute: 0.1 10*3/uL (ref 0.0–0.7)
Eosinophils Relative: 1 %
HEMATOCRIT: 37.4 % (ref 36.0–46.0)
HEMOGLOBIN: 12.4 g/dL (ref 12.0–15.0)
Lymphocytes Relative: 13 %
Lymphs Abs: 1.3 10*3/uL (ref 0.7–4.0)
MCH: 30.1 pg (ref 26.0–34.0)
MCHC: 33.2 g/dL (ref 30.0–36.0)
MCV: 90.8 fL (ref 78.0–100.0)
MONO ABS: 0.8 10*3/uL (ref 0.1–1.0)
Monocytes Relative: 9 %
NEUTROS ABS: 7.4 10*3/uL (ref 1.7–7.7)
NEUTROS PCT: 77 %
Platelets: 170 10*3/uL (ref 150–400)
RBC: 4.12 MIL/uL (ref 3.87–5.11)
RDW: 17.2 % — AB (ref 11.5–15.5)
WBC: 9.5 10*3/uL (ref 4.0–10.5)

## 2016-09-27 LAB — AMMONIA: AMMONIA: 18 umol/L (ref 9–35)

## 2016-09-27 MED ORDER — CIPROFLOXACIN HCL 500 MG PO TABS: 500.0000 mg | ORAL_TABLET | Freq: Two times a day (BID) | ORAL | 0 refills | Status: DC

## 2016-09-27 NOTE — ED Notes (Signed)
Bed: WA02 Expected date:  Expected time:  Means of arrival:  Comments: 

## 2016-09-27 NOTE — ED Provider Notes (Signed)
[facility] DEPT Provider Note   CSN: [id number] Arrival date & time: 09/27/16  1002     History   Chief Complaint Chief Complaint  Patient presents with  . Altered Mental Status  . Fatigue    HPI MILAYSIA TRAHAN is a 71 y.o. female.  71 year old female presents from home with her husband due to altered mental status which began yesterday. Recently completed therapy for a UTI and husband is concerned that the UTIs not fully resolved because her symptoms are very similar to what she's had a UTI in the past. She does have a history of brain cancer. No fever, vomiting, falls. Patient had difficult to following commands yesterday and today while she was at OT. No new medication started. No cough or congestion. Was instructed to come here for further management      Past Medical History:  Diagnosis Date  . Anxiety   . Bradycardia    At times with pulse in the 40s  . Brain cancer ([place])    Glioblastoma  . Diarrhea    With blating, improved with gluten-free diet  . Diastolic dysfunction   . Essential hypertension, benign    Always has HTN when at the doctor's office.  . 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 Gout   . Hyperlipidemia   . Lynch syndrome   . Mild aortic sclerosis ([place])   . Mitral valve problem    Mildly thickened mitral valve  . Mitral valve prolapse    Mild, anterior  . MR (mitral regurgitation)    ECHO 08/10/09 shows mild MR again and normal EF. No significant changes from prior ECHO.  Marland Kitchen Palpitations    Occasional, but are not significant. ECHO 08/20/08 - Normal EF (60%), mildly thickened mitral valve with mild anterior mitral valve prolapse, mild MR, mild aortic sclerosis, grade 1 diastolic dysfunction.  . Proteinuria    Likely secondary to Diabetes    Patient Active Problem List   Diagnosis Date Noted  . Recurrent falls 09/22/2016  . Thrombocytopenia ([place]) 09/13/2016  . Depression, major, single episode, mild ([place]) 09/13/2016  . Skin  rash 08/30/2016  . Elevated liver enzymes 08/23/2016  . Anxiety attack 08/16/2016  . Other constipation 08/09/2016  . 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 Polyposis syndrome, familial 07/20/2016  . H/O: hysterectomy 07/20/2016  . Leukocytosis   . Monocytosis   . Acute lower UTI   . GBM (glioblastoma multiforme) ([place])   . 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 MR (mitral regurgitation)   . Brain tumor ([place])   . Apraxia   . Left-sided weakness   . Steroid-induced hyperglycemia   . Leucocytosis   . Benign essential HTN   . History of gout   . Dysuria   . Acute respiratory failure with hypoxia ([place])   . Brain mass   . Seizure ([place]) 06/30/2016  . Status epilepticus ([place]) 06/30/2016    Past Surgical History:  Procedure Laterality Date  . ABDOMINAL HYSTERECTOMY    . APPLICATION OF CRANIAL NAVIGATION Right 07/07/2016   Procedure: APPLICATION OF CRANIAL NAVIGATION;  Surgeon: Consuella Lose, MD;  Location: [facility];  Service: Neurosurgery;  Laterality: Right;  . BREAST LUMPECTOMY Left   . CRANIOTOMY Right 07/07/2016   Procedure: CRANIOTOMY TUMOR EXCISION w/BrainLab;  Surgeon: Consuella Lose, MD;  Location: [facility];  Service: Neurosurgery;  Laterality: Right;  . HEMORROIDECTOMY  2009  . TUBAL LIGATION      OB History    No  data available       Home Medications    Prior to Admission medications   Medication Sig Start Date End Date Taking? Authorizing Provider  acetaminophen (TYLENOL) 500 MG tablet Take 1,000 mg by mouth every 6 (six) hours as needed for mild pain or moderate pain.    Historical Provider, MD  Cholecalciferol (VITAMIN D) 2000 UNITS tablet Take 2,000 Units by mouth daily.    Historical Provider, MD  ciprofloxacin (CIPRO) 500 MG tablet Take 1 tablet (500 mg total) by mouth 2 (two) times daily. One po bid x 7 days 09/18/16   Margarita Mail, PA-C  dexamethasone (DECADRON) 2 MG tablet Take 1 tablet (2 mg total) by mouth 2 (two) times daily. Patient  taking differently: Take 2 mg by mouth 2 (two) times daily. On odd days take 2 mg in am 08/30/16   Heath Lark, MD  famotidine (PEPCID) 20 MG tablet Take 1 tablet (20 mg total) by mouth 2 (two) times daily. 08/16/16   Heath Lark, MD  levETIRAcetam (KEPPRA) 1000 MG tablet Take 1 tablet (1,000 mg total) by mouth 2 (two) times daily. 08/19/16   Ankit Lorie Phenix, MD  LORazepam (ATIVAN) 0.5 MG tablet Take 1 tablet (0.5 mg total) by mouth 2 (two) times daily as needed for anxiety. Patient not taking: Reported on 09/18/2016 08/16/16   Heath Lark, MD  Multiple Vitamin (MULTIVITAMIN) tablet Take 1 tablet by mouth daily.    Historical Provider, MD  ondansetron (ZOFRAN) 8 MG tablet Take 1 tablet (8 mg total) by mouth every 8 (eight) hours as needed for nausea. Patient not taking: Reported on 09/18/2016 07/22/16   Heath Lark, MD  senna-docusate (SENOKOT-S) 8.6-50 MG tablet Take 2 tablets by mouth at bedtime. 07/21/16   Bary Leriche, PA-C  sulfamethoxazole-trimethoprim (BACTRIM DS,SEPTRA DS) 800-160 MG tablet Take 1 tablet by mouth 3 (three) times a week. Patient not taking: Reported on 09/21/2016 08/17/16   Heath Lark, MD  temozolomide (TEMODAR) 140 MG capsule Take 2 caps daily for 5 days, every 28 days 09/21/16   Heath Lark, MD    Family History Family History  Problem Relation Age of Onset  . Cancer Mother     lung cancer and colon ca  . Colon cancer Mother   . Cancer Father   . Colon cancer Father 70  . Diabetes Father     Social History Social History  Substance Use Topics  . Smoking status: Never Smoker  . Smokeless tobacco: Never Used  . Alcohol use Yes     Comment: socially     Allergies   Accupril [quinapril hcl]; Latex; Vitamin e; Naproxen; and Zoloft [sertraline hcl]   Review of Systems Review of Systems  All other systems reviewed and are negative.    Physical Exam Updated Vital Signs BP 137/70 (BP Location: Right Arm)   Pulse 99   Temp 98.5 F (36.9 C) (Oral)   Resp 17    SpO2 96%   Physical Exam  Constitutional: She is oriented to person, place, and time. She appears well-developed and well-nourished.  Non-toxic appearance. No distress.  HENT:  Head: Normocephalic and atraumatic.  Eyes: Conjunctivae, EOM and lids are normal. Pupils are equal, round, and reactive to light.  Neck: Normal range of motion. Neck supple. No tracheal deviation present. No thyroid mass present.  Cardiovascular: Normal rate, regular rhythm and normal heart sounds.  Exam reveals no gallop.   No murmur heard. Pulmonary/Chest: Effort normal and breath sounds normal. No stridor.  No respiratory distress. She has no decreased breath sounds. She has no wheezes. She has no rhonchi. She has no rales.  Abdominal: Soft. Normal appearance and bowel sounds are normal. She exhibits no distension. There is no tenderness. There is no rebound and no CVA tenderness.  Musculoskeletal: Normal range of motion. She exhibits no edema or tenderness.  Neurological: She is alert and oriented to person, place, and time. She has normal strength. No cranial nerve deficit or sensory deficit. GCS eye subscore is 4. GCS verbal subscore is 5. GCS motor subscore is 6.  Skin: Skin is warm and dry. No abrasion and no rash noted.  Psychiatric: She has a normal mood and affect. Her behavior is normal. Her speech is delayed.  Nursing note and vitals reviewed.    ED Treatments / Results  Labs (all labs ordered are listed, but only abnormal results are displayed) Labs Reviewed - No data to display  EKG  EKG Interpretation None       Radiology No results found.  Procedures Procedures (including critical care time)  Medications Ordered in ED Medications - No data to display   Initial Impression / Assessment and Plan / ED Course  I have reviewed the triage vital signs and the nursing notes.  Pertinent labs & imaging results that were available during my care of the patient were reviewed by me and considered  in my medical decision making (see chart for details).     Will continue patient's Cipro as she only had 7 days of treatment. Culture reports reviewed. Urine culture sent today.. She is alert and oriented 3 at this time. Patient has follow-up scheduled with her oncologist in 3 days return precautions given  Final Clinical Impressions(s) / ED Diagnoses   Final diagnoses:  None    New Prescriptions New Prescriptions   No medications on file     Lacretia Leigh, MD 09/27/16 [phone]

## 2016-09-27 NOTE — Therapy (Signed)
[facility] [address] [place], [place], [address] Phone: [phone]   Fax:  [phone]  Occupational Therapy Treatment  Patient Details  Name: Crystal Parsons MRN: [id number] Date of Birth: [birth year] Referring Provider: Dr. Delice Lesch  Encounter Date: 09/27/2016    Past Medical History:  Diagnosis Date  . Anxiety   . Bradycardia    At times with pulse in the 40s  . Brain cancer ([place])    Glioblastoma  . Diarrhea    With blating, improved with gluten-free diet  . Diastolic dysfunction   . Essential hypertension, benign    Always has HTN when at the doctor's office.  . 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 Gout   . Hyperlipidemia   . Lynch syndrome   . Mild aortic sclerosis ([place])   . Mitral valve problem    Mildly thickened mitral valve  . Mitral valve prolapse    Mild, anterior  . MR (mitral regurgitation)    ECHO 08/10/09 shows mild MR again and normal EF. No significant changes from prior ECHO.  Marland Kitchen Palpitations    Occasional, but are not significant. ECHO 08/20/08 - Normal EF (60%), mildly thickened mitral valve with mild anterior mitral valve prolapse, mild MR, mild aortic sclerosis, grade 1 diastolic dysfunction.  . Proteinuria    Likely secondary to Diabetes    Past Surgical History:  Procedure Laterality Date  . ABDOMINAL HYSTERECTOMY    . APPLICATION OF CRANIAL NAVIGATION Right 07/07/2016   Procedure: APPLICATION OF CRANIAL NAVIGATION;  Surgeon: Consuella Lose, MD;  Location: [facility];  Service: Neurosurgery;  Laterality: Right;  . BREAST LUMPECTOMY Left   . CRANIOTOMY Right 07/07/2016   Procedure: CRANIOTOMY TUMOR EXCISION w/BrainLab;  Surgeon: Consuella Lose, MD;  Location: [facility];  Service: Neurosurgery;  Laterality: Right;  . HEMORROIDECTOMY  2009  . TUBAL LIGATION      There were no vitals filed for this visit.      Subjective Assessment - 09/27/16 0938    Patient is  accompained by: Family member  husband   Pertinent History see epic pt with R frontal mass x2 s/p crani starts radiation and chemo today;    Patient Stated Goals I want to feel like myself again   Currently in Pain? No/denies     Pt arrived today with husband.  Husband reports that pt finished antibiotic for UTI on Sunday of this week.  He stated yesterday was a great day for the patient and they were able to go to the gym, meet with case manager, go to the store, etc without difficulty.  Last night pt did not sleep well and husband reports today that pt is extremely confused again (pt had episode of 2 days with significant confusion when UTI was first diagnosed).  Pt had difficulty today following simple conversation and husband states "this is a huge change since yesterday."  Given pt 's complicated medical history and recent bouts of confusion session was terminated and recommended that pt be seen in the ED for assessment.  Husband states "I am worried that this bladder infection isn't really gone."  Pt and husband in agreement with plan today.                           OT Short Term Goals - 09/27/16 [id number]      OT SHORT TERM GOAL #1   Title Pt and husband will be mod  I with HEP - 08/30/2106   Time 4   Period Weeks   Status Achieved     OT SHORT TERM GOAL #2   Title Pt will decrease time on 9 hold peg with L hand by at least 4 seconds (baseline = 38.25) to assist with fine motor tasks   Time 4   Period Weeks   Status Achieved  30.41 secs      OT SHORT TERM GOAL #3   Title Pt will require supervision for simple, familiar hot meal prep   Time 4   Period Weeks   Status Achieved     OT SHORT TERM GOAL #4   Title Pt will demonstate ability to follow 2-3 step instruction within context of functional, familar task.    Time 4   Period Weeks   Status Achieved     OT SHORT TERM GOAL #5   Title Pt will identify at least 2 strategies for ST memory    Time 4   Period  Weeks   Status Achieved  ST memory strategies discussed will reinforce     OT SHORT TERM GOAL #6   Title Pt will require no more than 2 vc's for selective attention during functional familar tasks in a busy environment.    Time 4   Period Weeks   Status Achieved           OT Long Term Goals - 09/27/16 [id number]      OT LONG TERM GOAL #1   Title Pt and husband will be mod I with upgraded HEP prn - 09/27/2016   Time 8   Period Weeks   Status On-going     OT LONG TERM GOAL #2   Title Pt will decrease time on 9 hole peg test with L hand by at least 8 seconds (baseline=38.25) to assist with functional tasks.   Time 8   Period Weeks   Status Achieved  26.25     OT LONG TERM GOAL #3   Title Pt will increase grip strength by 5 pounds to assist with functional use of L, non dominant hand (baseline= 30 pounds)   Time 8   Period Weeks   Status Achieved  35 lbs. - dominant hand 40     OT LONG TERM GOAL #4   Title Pt will be mod I with cooking 2 items simultaneously for altenating attention and safety with familiar hot meal prep.    Time 8   Period Weeks   Status On-going     OT LONG TERM GOAL #5   Title Pt will be mod I for laundry and simple home mgmt tasks.   Time 8   Period Weeks   Status On-going     OT LONG TERM GOAL #6   Title Pt will be able to set up med pill box with supervision only   Time 8   Period Weeks   Status Deferred  per husband, MD making mulitple med changes and pt and husband have asked that this goal be deferred at ths time     OT [place] #7   Title Pt will be mod I to attend to L during functional tasks.    Time 8   Period Weeks   Status Achieved     OT LONG TERM GOAL #8   Title Pt will demonstrate supination of LUE WFL's to hold pills in L hand.    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 Recurrent falls 09/22/2016  . Thrombocytopenia ([place]) 09/13/2016  . Depression, major, single episode, mild ([place]) 09/13/2016  . Skin rash 08/30/2016  . Elevated liver enzymes 08/23/2016  . Anxiety attack 08/16/2016  . Other constipation 08/09/2016  . 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 Polyposis syndrome, familial 07/20/2016  . H/O: hysterectomy 07/20/2016  . Leukocytosis   . Monocytosis   . Acute lower UTI   . GBM (glioblastoma multiforme) ([place])   . 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 MR (mitral regurgitation)   . Brain tumor ([place])   . Apraxia   . Left-sided weakness   . Steroid-induced hyperglycemia   . Leucocytosis   . Benign essential HTN   . History of gout   . Dysuria   . Acute respiratory failure with hypoxia ([place])   . Brain mass   . Seizure ([place]) 06/30/2016  . Status epilepticus ([place]) 06/30/2016    Quay Burow, OTR/L 09/27/2016, 9:43 AM  [facility] [address] [place], [place], [address] Phone: [phone]   Fax:  [phone]  Name: KEDRA TAMAS MRN: [id number] Date of Birth: [birth year]

## 2016-09-27 NOTE — ED Triage Notes (Signed)
Patient reports that patient was seen here for UTI and started on antibiotics on 18th. Patient got more confused again last night and today per female visitor.

## 2016-09-27 NOTE — ED Notes (Signed)
Patient ambulated to and from restroom no difficulty. Patient had "hat" to catch urine, hat did not catch urine. Will attempt at another time. Patient aware that we need urine.

## 2016-09-27 NOTE — ED Notes (Signed)
PT DISCHARGED. INSTRUCTIONS AND PRESCRIPTION GIVEN. AAOX2. PT IN NO APPARENT DISTRESS OR PAIN. THE OPPORTUNITY TO ASK QUESTIONS WAS PROVIDED.

## 2016-09-29 ENCOUNTER — Ambulatory Visit: Payer: Medicare Other | Admitting: Occupational Therapy

## 2016-09-29 ENCOUNTER — Telehealth: Payer: Self-pay | Admitting: Hematology and Oncology

## 2016-09-29 ENCOUNTER — Other Ambulatory Visit: Payer: Self-pay | Admitting: Hematology and Oncology

## 2016-09-29 ENCOUNTER — Encounter: Payer: Self-pay | Admitting: *Deleted

## 2016-09-29 ENCOUNTER — Ambulatory Visit: Payer: Medicare Other | Admitting: Speech Pathology

## 2016-09-29 DIAGNOSIS — N390 Urinary tract infection, site not specified: Secondary | ICD-10-CM

## 2016-09-29 LAB — URINE CULTURE: Culture: >=100000 — AB

## 2016-09-29 MED ORDER — AMOXICILLIN-POT CLAVULANATE 875-125 MG PO TABS: 1.0000 | ORAL_TABLET | Freq: Two times a day (BID) | ORAL | 0 refills | Status: DC

## 2016-09-29 MED FILL — AMOX TR-K CLV 875-125 MG TA: 875-125 | 10 days supply | Qty: 20 | Fill #0

## 2016-09-29 NOTE — Progress Notes (Addendum)
[facility] Work  Clinical Social Work received request from patient's family for CSW contact.  CSW contacted patient's daughter, by phone.  Patient's daughter shared many concerns regarding patient's cognitive functioning, behavioral changes, physical condition declining, and stress on patient's spouse, Crystal Parsons.  CSW provided active listening and validated patient's daughter's concerns.  CSW briefly discussed common side effects related to treatment after glioblastoma and possible sources of support such as literature and in home care.  CSW has already established a therapeutic counseling relationship patient and has ongoing sessions scheduled.  CSW suggested her colleague, Loren Racer, may be able to support the family by providing information on home care support and additional resources.  CSW Crystal Parsons will contact patient's spouse, Crystal Parsons.  CSW contacted patient by phone and shared the above plan.  Crystal Parsons was agreeable to [facility] reaching out to her spouse.  She expressed her only concern "comes back to having control".  CSW and patient briefly explored concept of "having control" and "when to seek support from others".  CSW and patient agreed to discuss further during next counseling session.  Polo Riley, MSW, LCSW, OSW-C Clinical Social Worker [facility] [phone]

## 2016-09-29 NOTE — Telephone Encounter (Signed)
I spoke with her husband.  The patient has recent weakness and altered mental status.  I reviewed urine culture drawn at the emergency department 2 days ago. She has new E. coli infection that is currently resistant to Bactrim and ciprofloxacin.  I told him to stop ciprofloxacin and switch her antibiotics to Augmentin.  He verbalized understanding

## 2016-09-30 ENCOUNTER — Encounter: Payer: Self-pay | Admitting: Hematology and Oncology

## 2016-09-30 ENCOUNTER — Encounter: Payer: Self-pay | Admitting: *Deleted

## 2016-09-30 ENCOUNTER — Other Ambulatory Visit (HOSPITAL_BASED_OUTPATIENT_CLINIC_OR_DEPARTMENT_OTHER): Payer: Medicare Other

## 2016-09-30 ENCOUNTER — Telehealth: Payer: Self-pay | Admitting: Hematology and Oncology

## 2016-09-30 ENCOUNTER — Ambulatory Visit (HOSPITAL_BASED_OUTPATIENT_CLINIC_OR_DEPARTMENT_OTHER): Payer: Medicare Other | Admitting: Hematology and Oncology

## 2016-09-30 VITALS — BP 156/69 | HR 87 | Temp 98.0°F | Resp 17 | Ht 62.0 in | Wt 202.9 lb

## 2016-09-30 DIAGNOSIS — C711 Malignant neoplasm of frontal lobe: Secondary | ICD-10-CM

## 2016-09-30 DIAGNOSIS — K5909 Other constipation: Secondary | ICD-10-CM | POA: Diagnosis not present

## 2016-09-30 DIAGNOSIS — N390 Urinary tract infection, site not specified: Secondary | ICD-10-CM | POA: Diagnosis not present

## 2016-09-30 DIAGNOSIS — R296 Repeated falls: Secondary | ICD-10-CM | POA: Diagnosis not present

## 2016-09-30 DIAGNOSIS — C719 Malignant neoplasm of brain, unspecified: Secondary | ICD-10-CM

## 2016-09-30 DIAGNOSIS — I10 Essential (primary) hypertension: Secondary | ICD-10-CM

## 2016-09-30 LAB — CBC WITH DIFFERENTIAL/PLATELET
BASO%: 0.4 % (ref 0.0–2.0)
BASOS ABS: 0 10*3/uL (ref 0.0–0.1)
EOS%: 0 % (ref 0.0–7.0)
Eosinophils Absolute: 0 10*3/uL (ref 0.0–0.5)
HEMATOCRIT: 37.8 % (ref 34.8–46.6)
HEMOGLOBIN: 12.4 g/dL (ref 11.6–15.9)
LYMPH#: 1.6 10*3/uL (ref 0.9–3.3)
LYMPH%: 20.5 % (ref 14.0–49.7)
MCH: 31.2 pg (ref 25.1–34.0)
MCHC: 32.8 g/dL (ref 31.5–36.0)
MCV: 95 fL (ref 79.5–101.0)
MONO#: 0.6 10*3/uL (ref 0.1–0.9)
MONO%: 8.1 % (ref 0.0–14.0)
NEUT#: 5.5 10*3/uL (ref 1.5–6.5)
NEUT%: 71 % (ref 38.4–76.8)
NRBC: 0 % (ref 0–0)
Platelets: 164 10*3/uL (ref 145–400)
RBC: 3.98 10*6/uL (ref 3.70–5.45)
RDW: 17.3 % — AB (ref 11.2–14.5)
WBC: 7.8 10*3/uL (ref 3.9–10.3)

## 2016-09-30 LAB — COMPREHENSIVE METABOLIC PANEL
ALBUMIN: 3.5 g/dL (ref 3.5–5.0)
ALK PHOS: 59 U/L (ref 40–150)
ALT: 42 U/L (ref 0–55)
AST: 14 U/L (ref 5–34)
Anion Gap: 11 mEq/L (ref 3–11)
BILIRUBIN TOTAL: 0.37 mg/dL (ref 0.20–1.20)
BUN: 17.4 mg/dL (ref 7.0–26.0)
CALCIUM: 8.9 mg/dL (ref 8.4–10.4)
CO2: 18 mEq/L — ABNORMAL LOW (ref 22–29)
Chloride: 112 mEq/L — ABNORMAL HIGH (ref 98–109)
Creatinine: 0.7 mg/dL (ref 0.6–1.1)
EGFR: 85 mL/min/{1.73_m2} — ABNORMAL LOW (ref >90)
GLUCOSE: 123 mg/dL (ref 70–140)
POTASSIUM: 3.7 meq/L (ref 3.5–5.1)
SODIUM: 142 meq/L (ref 136–145)
TOTAL PROTEIN: 6.1 g/dL — AB (ref 6.4–8.3)

## 2016-09-30 LAB — TECHNOLOGIST REVIEW

## 2016-09-30 MED ORDER — DEXAMETHASONE 1 MG PO TABS: 2.0000 mg | ORAL_TABLET | Freq: Two times a day (BID) | ORAL | 1 refills | Status: DC

## 2016-09-30 MED ORDER — LEVETIRACETAM 1000 MG PO TABS
1000.0000 mg | ORAL_TABLET | Freq: Two times a day (BID) | ORAL | 11 refills | Status: DC
Start: 2016-09-30 — End: 2016-10-10

## 2016-09-30 MED FILL — DEXAMETHASONE 1 MG TABLET: 1 | 15 days supply | Qty: 60 | Fill #0

## 2016-09-30 MED FILL — levETIRAcetam 1000 MG TABS: 1000 | 30 days supply | Qty: 60 | Fill #0

## 2016-09-30 NOTE — Assessment & Plan Note (Signed)
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 Her husband has made arrangements for her to sleep in the couching office and she is doing better since then. I reinforced the importance of aggressive physical therapy and I plan to try to wean her off steroids as soon as possible

## 2016-09-30 NOTE — Assessment & Plan Note (Addendum)
She has completed concurrent chemoradiation treatment. She had a recent CT imaging study done in the emergency department which showed no significant acute changes. We discussed the importance of waiting for the brain swelling to subside before repeating MRI. She will begin maintenance Temodar around middle of March, approximately 4 weeks from completion of chemoradiation treatment. I will continue to see her on a regular basis for supportive care. We discussed dexamethasone taper. The dose of dexamethasone was recently increased due to changes in mental status. I suspect the mental status change could be related to urinary tract infection. Per discussion with her husband, I plan to continue dexamethasone taper to 2 mg twice a day; to be further reduced to 2 mg in the morning and 1 mg in the afternoon starting next week I plan for further taper next week when I see her back. She will resume Bactrim for PCP prophylaxis next week after she completes antibiotic therapy for urinary tract infection

## 2016-09-30 NOTE — Assessment & Plan Note (Signed)
She had recent constipation. We discussed laxatives regimen.

## 2016-09-30 NOTE — Progress Notes (Signed)
[facility] OFFICE PROGRESS NOTE  Patient Care Team: Lavone Orn, MD as PCP - General (Internal Medicine)  SUMMARY OF ONCOLOGIC HISTORY:   Glioblastoma multiforme of brain [facility])   06/30/2016 - 07/10/2016 Hospital Admission    The patient was admitted to the hospital after presentation with seizure. She was subsequently found to have brain tumor and underwent primary resection. She was subsequently discharged to rehabilitation facility      07/01/2016 Imaging    CT head showed mass lesion within the right frontal lobe measuring up to 4.8 cm. 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 However, a cerebral abscess may have a similar appearance. MRI with and without contrast is recommended for further characterization. 2. No significant mass effect or midline shift.  No hydrocephalus.      07/01/2016 Imaging    Ct chest showed right parahilar density appears to be atelectasis or possible infiltrate but no mass is identified. There is also streaky bibasilar subsegmental atelectasis and very small pleural effusions. 2. No mediastinal or hilar mass or adenopathy. Scattered lymph nodes are noted.      07/01/2016 Imaging    MRI brain showed Motion degraded examination. 3 x 4.3 x 4.5 cm complex RIGHT frontal lobe mass with imaging characteristics of primary brain tumor. 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 Local edema versus nonenhancing infiltrative tumor results in 2 mm RIGHT to LEFT midline shift. No ventricular entrapment.      07/07/2016 Imaging    Interval RIGHT craniotomy for resection of dominant RIGHT frontal lobe tumor without convincing evidence of residual local disease. Intraventricular extension of blood products without hydrocephalus. 2 mm RIGHT to LEFT midline shift without ventricular entrapment. Residual subcentimeter RIGHT frontal lobe satellite nodule consistent  with tumor.      07/07/2016 Surgery    Dr. Kathyrn Sheriff performed stereotactic right frontal craniotomy for resection of tumor       07/07/2016 Pathology Results    Accession: [id number] Brain, for tumor resection, Right Frontal Lobe - GLIOBLASTOMA MULTIFORME WHO GRADE IV/IV. - SEE ONCOLOGY TABLE BELOW. Microscopic Comment ONCOLOGY TABLE - BRAIN AND SPINAL CORD 1. Procedure: Resection 2. Tumor site, including laterality: Right frontal lobe 3. Maximum tumor size (cm): At least 4.8 cm (gross measurement) 4. Histologic type: Glioblastoma multiforme 5. Grade: WHO grade IV/IV 6. Margins (if applicable): Can not be assessed 7. Ancillary studies: Per protocol, a block will be sent for MGMT and IDH1/2 testing and MGMT was positive      08/02/2016 - 09/14/2016 Radiation Therapy    She started concurrent chemo/RT      08/02/2016 - 09/14/2016 Chemotherapy    She started concurrent chemo/RT      09/18/2016 Imaging    Ct head showed no acute finding. History of glioblastoma with right frontal resection cavity and adjacent dense cellular tumor. No acute hemorrhage or significant mass effect       INTERVAL HISTORY: Please see below for problem oriented charting.  the patient is here today accompanied by her husband. She had recent recurrent falls at home but no major injuries. She had another episode of urinary tract infection and presented to the emergency department. As of 09/29/2016, antibiotic therapy has been switched to Augmentin. She denies dysuria, frequency or urgency. No fever, chills or further altered mental status. She has lost a few pounds of weight since I saw her.  She is participating in physical  therapy and is walking at the gym. Her mood is stable.  REVIEW OF SYSTEMS:   Constitutional: Denies fevers, chills or abnormal weight loss Eyes: Denies blurriness of vision Ears, nose, mouth, throat, and face: Denies mucositis or sore throat Respiratory: Denies cough, dyspnea or  wheezes Cardiovascular: Denies palpitation, chest discomfort or lower extremity swelling Gastrointestinal:  Denies nausea, heartburn or change in bowel habits Skin: Denies abnormal skin rashes Neurological:Denies numbness, tingling or new weaknesses Behavioral/Psych: Mood is stable, no new changes  All other systems were reviewed with the patient and are negative.  I have reviewed the past medical history, past surgical history, social history and family history with the patient and they are unchanged from previous note.  ALLERGIES:  is allergic to accupril [quinapril hcl]; latex; naproxen; vitamin e; and zoloft [sertraline hcl].  MEDICATIONS:  Current Outpatient Prescriptions  Medication Sig Dispense Refill  . acetaminophen (TYLENOL) 500 MG tablet Take 1,000 mg by mouth every 6 (six) hours as needed for mild pain or moderate pain.    Marland Kitchen amoxicillin-clavulanate (AUGMENTIN) 875-125 MG tablet Take 1 tablet by mouth 2 (two) times daily. 20 tablet 0  . Cholecalciferol (VITAMIN D) 2000 UNITS tablet Take 2,000 Units by mouth daily with supper.     . ciprofloxacin (CIPRO) 500 MG tablet Take 1 tablet (500 mg total) by mouth 2 (two) times daily. One po bid x 7 days (Patient not taking: Reported on 09/27/2016) 14 tablet 0  . ciprofloxacin (CIPRO) 500 MG tablet Take 1 tablet (500 mg total) by mouth 2 (two) times daily. 6 tablet 0  . dexamethasone (DECADRON) 1 MG tablet Take 2 tablets (2 mg total) by mouth 2 (two) times daily with a meal. 60 tablet 1  . dexamethasone (DECADRON) 2 MG tablet Take 1 tablet (2 mg total) by mouth 2 (two) times daily. (Patient not taking: Reported on 09/27/2016) 60 tablet 1  . dexamethasone (DECADRON) 4 MG tablet Take 2-4 mg by mouth 2 (two) times daily with a meal. Takes 4 mg in the morning and 2 mg in the afternoon    . famotidine (PEPCID) 20 MG tablet Take 1 tablet (20 mg total) by mouth 2 (two) times daily. 60 tablet 11  . levETIRAcetam (KEPPRA) 1000 MG tablet Take 1 tablet  (1,000 mg total) by mouth 2 (two) times daily. 60 tablet 11  . LORazepam (ATIVAN) 0.5 MG tablet Take 1 tablet (0.5 mg total) by mouth 2 (two) times daily as needed for anxiety. 60 tablet 0  . Multiple Vitamin (MULTIVITAMIN) tablet Take 1 tablet by mouth daily with supper.     . ondansetron (ZOFRAN) 8 MG tablet Take 1 tablet (8 mg total) by mouth every 8 (eight) hours as needed for nausea. 60 tablet 3  . senna-docusate (SENOKOT-S) 8.6-50 MG tablet Take 2 tablets by mouth at bedtime. 100 tablet 0  . sulfamethoxazole-trimethoprim (BACTRIM DS,SEPTRA DS) 800-160 MG tablet Take 1 tablet by mouth 3 (three) times a week. (Patient taking differently: Take 1 tablet by mouth 3 (three) times a week. Takes on Tuesday's, Thursday's, and Saturday's) 60 tablet 11  . temozolomide (TEMODAR) 140 MG capsule Take 2 caps daily for 5 days, every 28 days (Patient not taking: Reported on 09/27/2016) 10 capsule 5   No current facility-administered medications for this visit.     PHYSICAL EXAMINATION: ECOG PERFORMANCE STATUS: 1 - Symptomatic but completely ambulatory  Vitals:   09/30/16 1306  BP: (!) 156/69  Pulse: 87  Resp: 17  Temp: 98 F (36.7  C)   Filed Weights   09/30/16 1306  Weight: 202 lb 14.4 oz (92 kg)    GENERAL:alert, no distress and comfortable.  She appears cushingoid SKIN: skin color, texture, turgor are normal, no rashes or significant lesions EYES: normal, Conjunctiva are pink and non-injected, sclera clear OROPHARYNX:no exudate, no erythema and lips, buccal mucosa, and tongue normal  NECK: supple, thyroid normal size, non-tender, without nodularity LYMPH:  no palpable lymphadenopathy in the cervical, axillary or inguinal LUNGS: clear to auscultation and percussion with normal breathing effort HEART: regular rate & rhythm and no murmurs and no lower extremity edema ABDOMEN:abdomen soft, non-tender and normal bowel sounds Musculoskeletal:no cyanosis of digits and no clubbing  NEURO: alert &  oriented x 3 with fluent speech, no focal motor/sensory deficits  LABORATORY DATA:  I have reviewed the data as listed    Component Value Date/Time   NA 142 09/30/2016 1235   K 3.7 09/30/2016 1235   CL 108 09/27/2016 1052   CO2 18 (L) 09/30/2016 1235   GLUCOSE 123 09/30/2016 1235   BUN 17.4 09/30/2016 1235   CREATININE 0.7 09/30/2016 1235   CALCIUM 8.9 09/30/2016 1235   PROT 6.1 (L) 09/30/2016 1235   ALBUMIN 3.5 09/30/2016 1235   AST 14 09/30/2016 1235   ALT 42 09/30/2016 1235   ALKPHOS 59 09/30/2016 1235   BILITOT 0.37 09/30/2016 1235   GFRNONAA >60 09/27/2016 1052   GFRAA >60 09/27/2016 1052    No results found for: SPEP, UPEP  Lab Results  Component Value Date   WBC 7.8 09/30/2016   NEUTROABS 5.5 09/30/2016   HGB 12.4 09/30/2016   HCT 37.8 09/30/2016   MCV 95.0 09/30/2016   PLT 164 09/30/2016      Chemistry      Component Value Date/Time   NA 142 09/30/2016 1235   K 3.7 09/30/2016 1235   CL 108 09/27/2016 1052   CO2 18 (L) 09/30/2016 1235   BUN 17.4 09/30/2016 1235   CREATININE 0.7 09/30/2016 1235      Component Value Date/Time   CALCIUM 8.9 09/30/2016 1235   ALKPHOS 59 09/30/2016 1235   AST 14 09/30/2016 1235   ALT 42 09/30/2016 1235   BILITOT 0.37 09/30/2016 1235       RADIOGRAPHIC STUDIES: I have personally reviewed the radiological images as listed and agreed with the findings in the report. Ct Head Wo Contrast  Result Date: 09/27/2016 CLINICAL DATA:  Recent confusion, history of glioblastoma over section EXAM: CT HEAD WITHOUT CONTRAST TECHNIQUE: Contiguous axial images were obtained from the base of the skull through the vertex without intravenous contrast. COMPARISON:  09/18/2016 FINDINGS: Brain: There again noted changes in the right frontal lobe consistent with the prior resection. The overall appearance is stable from the recent exam. No findings to suggest acute hemorrhage, acute infarction or space-occupying mass lesion are noted. Vascular: No  hyperdense vessel or unexpected calcification. Skull: Postsurgical changes in the right frontal bone. Sinuses/Orbits: Mild mucosal thickening is noted within the sphenoid sinus. Other: None IMPRESSION: Postsurgical changes stable from the recent exam. Mild mucosal thickening in the sphenoid sinus stable from the prior study. Electronically Signed   By: Inez Catalina M.D.   On: 09/27/2016 11:18   Ct Head Wo Contrast  Result Date: 09/18/2016 CLINICAL DATA:  Weakness. EXAM: CT HEAD WITHOUT CONTRAST TECHNIQUE: Contiguous axial images were obtained from the base of the skull through the vertex without intravenous contrast. COMPARISON:  06/30/2016 head CT.  Postoperative brain MRI 07/07/2016  FINDINGS: Brain: Expected appearance of resection cavity in the right frontal lobe with neutral mass effect. The periphery has calcification and mildly dense material correlating with blood products on previous MRI. More posteriorly is a hazy area of high density which may have progressed from prior CT, most consistent with cellular tumor in this patient with history of glioblastoma. No evidence of acute infarct, hemorrhage, or hydrocephalus. There is no midline shift or herniation. Moderate white matter disease. Vascular: Atherosclerosis.  No hyperdense vessel. Skull: Expected changes of right frontal craniotomy. Sinuses/Orbits: Minimal mucus in the left sphenoid sinus. IMPRESSION: 1. No acute finding. 2. History of glioblastoma with right frontal resection cavity and adjacent dense cellular tumor. No acute hemorrhage or significant mass effect. Electronically Signed   By: Monte Fantasia M.D.   On: 09/18/2016 11:09    ASSESSMENT & PLAN:  Glioblastoma multiforme of brain [facility]) She has completed concurrent chemoradiation treatment. She had a recent CT imaging study done in the emergency department which showed no significant acute changes. We discussed the importance of waiting for the brain swelling to subside before  repeating MRI. She will begin maintenance Temodar around middle of March, approximately 4 weeks from completion of chemoradiation treatment. I will continue to see her on a regular basis for supportive care. We discussed dexamethasone taper. The dose of dexamethasone was recently increased due to changes in mental status. I suspect the mental status change could be related to urinary tract infection. Per discussion with her husband, I plan to continue dexamethasone taper to 2 mg twice a day; to be further reduced to 2 mg in the morning and 1 mg in the afternoon starting next week I plan for further taper next week when I see her back. She will resume Bactrim for PCP prophylaxis next week after she completes antibiotic therapy for urinary tract infection  Acute lower UTI The patient had recurrent urinary tract infection with multiple species of bacteria. On 09/18/2016, she had Citrobacter that was resistant to Bactrim but sensitive to Cipro. On 09/27/2016, she had E. coli UTI that is resistant to both Bactrim and Cipro but sensitive to penicillin type antibiotic treatment. As of 09/29/2016, antibiotic therapy is switched to Augmentin. Due to incomplete resolution of UTI, I plan to treat her with Augmentin for total of 10 days with repeat urinalysis and urine culture next week to ensure resolution of her UTI.   Benign essential HTN she will continue current medical management. I recommend close follow-up with primary care doctor for medication adjustment.   Other constipation She had recent constipation. We discussed laxatives regimen.  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 Her husband has made arrangements for her to sleep in the couching office and she is doing better since then. I reinforced the importance of aggressive physical therapy and I plan to try to wean her off steroids as soon as possible   Orders Placed This Encounter   Procedures  . UA Protein, Dipstick - [facility]    Standing Status:   Future    Standing Expiration Date:   11/04/2017  . Urinalysis, Microscopic - [facility]    Standing Status:   Future    Standing Expiration Date:   11/04/2017   All questions were answered. The patient knows to call the clinic with any problems, questions or concerns. No barriers to learning was detected. I spent 20 minutes counseling the patient face to face. The total time spent in the appointment was 25  minutes and more than 50% was on counseling and review of test results     Heath Lark, MD 09/30/2016 4:14 PM

## 2016-09-30 NOTE — Assessment & Plan Note (Signed)
The patient had recurrent urinary tract infection with multiple species of bacteria. On 09/18/2016, she had Citrobacter that was resistant to Bactrim but sensitive to Cipro. On 09/27/2016, she had E. coli UTI that is resistant to both Bactrim and Cipro but sensitive to penicillin type antibiotic treatment. As of 09/29/2016, antibiotic therapy is switched to Augmentin. Due to incomplete resolution of UTI, I plan to treat her with Augmentin for total of 10 days with repeat urinalysis and urine culture next week to ensure resolution of her UTI.

## 2016-09-30 NOTE — Telephone Encounter (Signed)
Appointments scheduled per 3/2 LOS. Patient given AVS report and calendars with future scheduled appointments. °

## 2016-09-30 NOTE — Telephone Encounter (Addendum)
Post ED Visit - Positive Culture Follow-up  Culture report reviewed by antimicrobial stewardship pharmacist:  []  Elenor Quinones, Pharm.D. []  Heide Guile, Pharm.D., BCPS []  Parks Neptune, Pharm.D. []  Alycia Rossetti, Pharm.D., BCPS []  [place], Pharm.D., BCPS, AAHIVP []  Legrand Como, Pharm.D., BCPS, AAHIVP []  Milus Glazier, Pharm.D. []  Stephens November, Pharm.D.  Positive urine culture Treated with augmentin, organism sensitive to the same and no further patient follow-up is required at this time.  Hazle Nordmann 09/30/2016, 3:31 PM

## 2016-09-30 NOTE — Progress Notes (Signed)
[place] Work  Clinical Social Work was referred by Gwinda Maine to assist family with caregiving needs/concerns. Clinical Social Worker contacted patient's husband to offer support and assess for needs. CSW provided him with CNA list, [facility] caregiving info and other brain tumor care giving info.  Husband aware to reach out as needed.   Clinical Social Work interventions:  Resource support  Loren Racer, [employer], OSW-C Clinical Social Worker [place]  [place] Phone: [phone] Fax: [phone]

## 2016-09-30 NOTE — Assessment & Plan Note (Signed)
she will continue current medical management. I recommend close follow-up with primary care doctor for medication adjustment.  

## 2016-10-05 ENCOUNTER — Inpatient Hospital Stay (HOSPITAL_COMMUNITY)
Admission: AD | Admit: 2016-10-05 | Discharge: 2016-10-10 | DRG: 055 | Disposition: A | Discharge status: Skilled Nursing Facility | Payer: Medicare Other | Source: Ambulatory Visit | Attending: Hematology and Oncology | Admitting: Hematology and Oncology

## 2016-10-05 ENCOUNTER — Other Ambulatory Visit (HOSPITAL_BASED_OUTPATIENT_CLINIC_OR_DEPARTMENT_OTHER): Payer: Medicare Other

## 2016-10-05 ENCOUNTER — Encounter: Payer: Medicare Other | Admitting: Hematology and Oncology

## 2016-10-05 ENCOUNTER — Other Ambulatory Visit: Payer: Self-pay | Admitting: Hematology and Oncology

## 2016-10-05 ENCOUNTER — Inpatient Hospital Stay (HOSPITAL_COMMUNITY): Payer: Medicare Other

## 2016-10-05 ENCOUNTER — Encounter (HOSPITAL_COMMUNITY): Payer: Self-pay | Admitting: *Deleted

## 2016-10-05 DIAGNOSIS — Z9181 History of falling: Secondary | ICD-10-CM | POA: Diagnosis not present

## 2016-10-05 DIAGNOSIS — Z8679 Personal history of other diseases of the circulatory system: Secondary | ICD-10-CM

## 2016-10-05 DIAGNOSIS — R2689 Other abnormalities of gait and mobility: Secondary | ICD-10-CM | POA: Diagnosis not present

## 2016-10-05 DIAGNOSIS — I341 Nonrheumatic mitral (valve) prolapse: Secondary | ICD-10-CM | POA: Diagnosis present

## 2016-10-05 DIAGNOSIS — R4182 Altered mental status, unspecified: Secondary | ICD-10-CM | POA: Diagnosis present

## 2016-10-05 DIAGNOSIS — G720 Drug-induced myopathy: Secondary | ICD-10-CM | POA: Diagnosis present

## 2016-10-05 DIAGNOSIS — I340 Nonrheumatic mitral (valve) insufficiency: Secondary | ICD-10-CM | POA: Diagnosis present

## 2016-10-05 DIAGNOSIS — Z800 Family history of malignant neoplasm of digestive organs: Secondary | ICD-10-CM | POA: Diagnosis not present

## 2016-10-05 DIAGNOSIS — Z801 Family history of malignant neoplasm of trachea, bronchus and lung: Secondary | ICD-10-CM | POA: Diagnosis not present

## 2016-10-05 DIAGNOSIS — R569 Unspecified convulsions: Secondary | ICD-10-CM | POA: Diagnosis present

## 2016-10-05 DIAGNOSIS — C711 Malignant neoplasm of frontal lobe: Secondary | ICD-10-CM | POA: Diagnosis not present

## 2016-10-05 DIAGNOSIS — Z1509 Genetic susceptibility to other malignant neoplasm: Secondary | ICD-10-CM

## 2016-10-05 DIAGNOSIS — C719 Malignant neoplasm of brain, unspecified: Secondary | ICD-10-CM | POA: Diagnosis not present

## 2016-10-05 DIAGNOSIS — K5909 Other constipation: Secondary | ICD-10-CM | POA: Diagnosis present

## 2016-10-05 DIAGNOSIS — E860 Dehydration: Secondary | ICD-10-CM

## 2016-10-05 DIAGNOSIS — R296 Repeated falls: Secondary | ICD-10-CM | POA: Diagnosis present

## 2016-10-05 DIAGNOSIS — Z87440 Personal history of urinary (tract) infections: Secondary | ICD-10-CM

## 2016-10-05 DIAGNOSIS — R32 Unspecified urinary incontinence: Secondary | ICD-10-CM | POA: Diagnosis present

## 2016-10-05 DIAGNOSIS — R410 Disorientation, unspecified: Secondary | ICD-10-CM

## 2016-10-05 DIAGNOSIS — Z9889 Other specified postprocedural states: Secondary | ICD-10-CM | POA: Diagnosis not present

## 2016-10-05 DIAGNOSIS — R404 Transient alteration of awareness: Secondary | ICD-10-CM

## 2016-10-05 DIAGNOSIS — F419 Anxiety disorder, unspecified: Secondary | ICD-10-CM | POA: Diagnosis not present

## 2016-10-05 DIAGNOSIS — E119 Type 2 diabetes mellitus without complications: Secondary | ICD-10-CM | POA: Diagnosis present

## 2016-10-05 DIAGNOSIS — R41841 Cognitive communication deficit: Secondary | ICD-10-CM | POA: Diagnosis not present

## 2016-10-05 DIAGNOSIS — N39490 Overflow incontinence: Secondary | ICD-10-CM | POA: Diagnosis not present

## 2016-10-05 DIAGNOSIS — R5383 Other fatigue: Secondary | ICD-10-CM | POA: Diagnosis not present

## 2016-10-05 DIAGNOSIS — R159 Full incontinence of feces: Secondary | ICD-10-CM

## 2016-10-05 DIAGNOSIS — R531 Weakness: Secondary | ICD-10-CM | POA: Diagnosis present

## 2016-10-05 DIAGNOSIS — T380X5A Adverse effect of glucocorticoids and synthetic analogues, initial encounter: Secondary | ICD-10-CM | POA: Diagnosis present

## 2016-10-05 DIAGNOSIS — K219 Gastro-esophageal reflux disease without esophagitis: Secondary | ICD-10-CM | POA: Diagnosis not present

## 2016-10-05 DIAGNOSIS — I700 Atherosclerosis of aorta: Secondary | ICD-10-CM | POA: Diagnosis present

## 2016-10-05 DIAGNOSIS — R278 Other lack of coordination: Secondary | ICD-10-CM | POA: Diagnosis not present

## 2016-10-05 DIAGNOSIS — R638 Other symptoms and signs concerning food and fluid intake: Secondary | ICD-10-CM | POA: Diagnosis present

## 2016-10-05 DIAGNOSIS — N390 Urinary tract infection, site not specified: Secondary | ICD-10-CM | POA: Diagnosis present

## 2016-10-05 DIAGNOSIS — K5900 Constipation, unspecified: Secondary | ICD-10-CM | POA: Diagnosis not present

## 2016-10-05 DIAGNOSIS — Z833 Family history of diabetes mellitus: Secondary | ICD-10-CM | POA: Diagnosis not present

## 2016-10-05 DIAGNOSIS — Z9221 Personal history of antineoplastic chemotherapy: Secondary | ICD-10-CM

## 2016-10-05 DIAGNOSIS — Z923 Personal history of irradiation: Secondary | ICD-10-CM

## 2016-10-05 DIAGNOSIS — J9811 Atelectasis: Secondary | ICD-10-CM | POA: Diagnosis not present

## 2016-10-05 DIAGNOSIS — C801 Malignant (primary) neoplasm, unspecified: Secondary | ICD-10-CM | POA: Diagnosis not present

## 2016-10-05 LAB — CBC WITH DIFFERENTIAL/PLATELET
BASO%: 0.6 % (ref 0.0–2.0)
BASOS ABS: 0 10*3/uL (ref 0.0–0.1)
EOS%: 0.6 % (ref 0.0–7.0)
Eosinophils Absolute: 0 10*3/uL (ref 0.0–0.5)
HCT: 37.8 % (ref 34.8–46.6)
HEMOGLOBIN: 12.6 g/dL (ref 11.6–15.9)
LYMPH%: 18.5 % (ref 14.0–49.7)
MCH: 31.5 pg (ref 25.1–34.0)
MCHC: 33.3 g/dL (ref 31.5–36.0)
MCV: 94.5 fL (ref 79.5–101.0)
MONO#: 0.7 10*3/uL (ref 0.1–0.9)
MONO%: 9.9 % (ref 0.0–14.0)
NEUT%: 70.4 % (ref 38.4–76.8)
NEUTROS ABS: 4.8 10*3/uL (ref 1.5–6.5)
Platelets: 191 10*3/uL (ref 145–400)
RBC: 4 10*6/uL (ref 3.70–5.45)
RDW: 17.5 % — AB (ref 11.2–14.5)
WBC: 6.9 10*3/uL (ref 3.9–10.3)
lymph#: 1.3 10*3/uL (ref 0.9–3.3)

## 2016-10-05 LAB — COMPREHENSIVE METABOLIC PANEL
ALBUMIN: 3.5 g/dL (ref 3.5–5.0)
ALK PHOS: 57 U/L (ref 40–150)
ALT: 43 U/L (ref 0–55)
AST: 14 U/L (ref 5–34)
Anion Gap: 10 mEq/L (ref 3–11)
BUN: 19.9 mg/dL (ref 7.0–26.0)
CO2: 20 mEq/L — ABNORMAL LOW (ref 22–29)
Calcium: 9 mg/dL (ref 8.4–10.4)
Chloride: 111 mEq/L — ABNORMAL HIGH (ref 98–109)
Creatinine: 0.8 mg/dL (ref 0.6–1.1)
EGFR: 76 mL/min/{1.73_m2} — ABNORMAL LOW (ref >90)
GLUCOSE: 132 mg/dL (ref 70–140)
POTASSIUM: 3.8 meq/L (ref 3.5–5.1)
Sodium: 141 mEq/L (ref 136–145)
Total Bilirubin: 0.28 mg/dL (ref 0.20–1.20)
Total Protein: 6.1 g/dL — ABNORMAL LOW (ref 6.4–8.3)

## 2016-10-05 MED ORDER — ONDANSETRON HCL 4 MG PO TABS: 4.0000 mg | ORAL_TABLET | Freq: Three times a day (TID) | ORAL | Status: DC | PRN

## 2016-10-05 MED ORDER — SODIUM CHLORIDE 0.9 % IV SOLN
8.0000 mg | Freq: Three times a day (TID) | INTRAVENOUS | Status: DC | PRN
  Filled 2016-10-05: qty 4

## 2016-10-05 MED ORDER — ADULT MULTIVITAMIN W/MINERALS CH
1.0000 | ORAL_TABLET | Freq: Every day | ORAL | Status: DC
  Administered 2016-10-06 – 2016-10-09 (×4): 1 via ORAL
  Filled 2016-10-05 (×9): qty 1

## 2016-10-05 MED ORDER — DOCUSATE SODIUM 100 MG PO CAPS
100.0000 mg | ORAL_CAPSULE | Freq: Two times a day (BID) | ORAL | Status: DC
  Administered 2016-10-05 – 2016-10-10 (×9): 100 mg via ORAL
  Filled 2016-10-05 (×9): qty 1

## 2016-10-05 MED ORDER — LEVETIRACETAM 500 MG PO TABS
1000.0000 mg | ORAL_TABLET | Freq: Two times a day (BID) | ORAL | Status: DC
  Administered 2016-10-05 – 2016-10-06 (×3): 1000 mg via ORAL
  Filled 2016-10-05 (×3): qty 2

## 2016-10-05 MED ORDER — ENOXAPARIN SODIUM 40 MG/0.4ML ~~LOC~~ SOLN
40.0000 mg | SUBCUTANEOUS | Status: DC
  Administered 2016-10-05 – 2016-10-09 (×5): 40 mg via SUBCUTANEOUS
  Filled 2016-10-05 (×5): qty 0.4

## 2016-10-05 MED ORDER — ONDANSETRON 4 MG PO TBDP: 4.0000 mg | ORAL_TABLET | Freq: Three times a day (TID) | ORAL | Status: DC | PRN

## 2016-10-05 MED ORDER — HALOPERIDOL LACTATE 5 MG/ML IJ SOLN: 1.0000 mg | Freq: Four times a day (QID) | INTRAMUSCULAR | Status: DC | PRN

## 2016-10-05 MED ORDER — DEXAMETHASONE 0.5 MG PO TABS
1.0000 mg | ORAL_TABLET | Freq: Two times a day (BID) | ORAL | Status: DC
  Administered 2016-10-06 – 2016-10-09 (×8): 1 mg via ORAL
  Filled 2016-10-05 (×9): qty 2

## 2016-10-05 MED ORDER — VITAMIN D 1000 UNITS PO TABS
2000.0000 [IU] | ORAL_TABLET | Freq: Every day | ORAL | Status: DC
  Administered 2016-10-06 – 2016-10-09 (×4): 2000 [IU] via ORAL
  Filled 2016-10-05 (×4): qty 2

## 2016-10-05 MED ORDER — ONDANSETRON HCL 4 MG/2ML IJ SOLN: 4.0000 mg | Freq: Three times a day (TID) | INTRAMUSCULAR | Status: DC | PRN

## 2016-10-05 MED ORDER — SODIUM CHLORIDE 0.9 % IV SOLN
INTRAVENOUS | Status: DC
Start: 2016-10-05 — End: 2016-10-07
  Administered 2016-10-05 – 2016-10-06 (×3): via INTRAVENOUS

## 2016-10-05 MED ORDER — ACETAMINOPHEN 325 MG PO TABS
650.0000 mg | ORAL_TABLET | ORAL | Status: DC | PRN
Start: 2016-10-05 — End: 2016-10-10
  Administered 2016-10-06: 650 mg via ORAL
  Filled 2016-10-05: qty 2

## 2016-10-05 MED ORDER — SENNA 8.6 MG PO TABS
1.0000 | ORAL_TABLET | Freq: Two times a day (BID) | ORAL | Status: DC
  Administered 2016-10-05 – 2016-10-10 (×9): 8.6 mg via ORAL
  Filled 2016-10-05 (×9): qty 1

## 2016-10-05 MED ORDER — FAMOTIDINE 20 MG PO TABS
20.0000 mg | ORAL_TABLET | Freq: Two times a day (BID) | ORAL | Status: DC
  Administered 2016-10-05 – 2016-10-10 (×10): 20 mg via ORAL
  Filled 2016-10-05 (×10): qty 1

## 2016-10-05 MED ORDER — ACETAMINOPHEN 500 MG PO TABS
1000.0000 mg | ORAL_TABLET | Freq: Four times a day (QID) | ORAL | Status: DC | PRN
Start: 2016-10-05 — End: 2016-10-05

## 2016-10-05 NOTE — Progress Notes (Signed)
This encounter was created in error - please disregard.

## 2016-10-05 NOTE — H&P (Signed)
[facility] ADMISSION NOTE  Patient Care Team: Lavone Orn, MD as PCP - General (Internal Medicine)  CHIEF COMPLAINTS/PURPOSE OF ADMISSION GBM, altered mental status, progressive neurological decline with new urinary and fecal incontinence and recurrent UTI  HISTORY OF PRESENTING ILLNESS:  Crystal Parsons 71 y.o. female is admitted for management of multiple problems as above Summary of oncologic history as follows:   Glioblastoma multiforme of brain ([facility])   06/30/2016 - 07/10/2016 Hospital Admission    The patient was admitted to the hospital after presentation with seizure. She was subsequently found to have brain tumor and underwent primary resection. She was subsequently discharged to rehabilitation facility      07/01/2016 Imaging    CT head showed mass lesion within the right frontal lobe measuring up to 4.8 cm. 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 However, a cerebral abscess may have a similar appearance. MRI with and without contrast is recommended for further characterization. 2. No significant mass effect or midline shift.  No hydrocephalus.      07/01/2016 Imaging    Ct chest showed right parahilar density appears to be atelectasis or possible infiltrate but no mass is identified. There is also streaky bibasilar subsegmental atelectasis and very small pleural effusions. 2. No mediastinal or hilar mass or adenopathy. Scattered lymph nodes are noted.      07/01/2016 Imaging    MRI brain showed Motion degraded examination. 3 x 4.3 x 4.5 cm complex RIGHT frontal lobe mass with imaging characteristics of primary brain tumor. 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 Local edema versus nonenhancing infiltrative tumor results in 2 mm RIGHT to LEFT midline shift. No ventricular entrapment.      07/07/2016 Imaging    Interval RIGHT craniotomy for resection of  dominant RIGHT frontal lobe tumor without convincing evidence of residual local disease. Intraventricular extension of blood products without hydrocephalus. 2 mm RIGHT to LEFT midline shift without ventricular entrapment. Residual subcentimeter RIGHT frontal lobe satellite nodule consistent with tumor.      07/07/2016 Surgery    Dr. Kathyrn Sheriff performed stereotactic right frontal craniotomy for resection of tumor       07/07/2016 Pathology Results    Accession: [id number] Brain, for tumor resection, Right Frontal Lobe - GLIOBLASTOMA MULTIFORME WHO GRADE IV/IV. - SEE ONCOLOGY TABLE BELOW. Microscopic Comment ONCOLOGY TABLE - BRAIN AND SPINAL CORD 1. Procedure: Resection 2. Tumor site, including laterality: Right frontal lobe 3. Maximum tumor size (cm): At least 4.8 cm (gross measurement) 4. Histologic type: Glioblastoma multiforme 5. Grade: WHO grade IV/IV 6. Margins (if applicable): Can not be assessed 7. Ancillary studies: Per protocol, a block will be sent for MGMT and IDH1/2 testing and MGMT was positive      08/02/2016 - 09/14/2016 Radiation Therapy    She started concurrent chemo/RT      08/02/2016 - 09/14/2016 Chemotherapy    She started concurrent chemo/RT      09/18/2016 Imaging    Ct head showed no acute finding. History of glioblastoma with right frontal resection cavity and adjacent dense cellular tumor. No acute hemorrhage or significant mass effect      She has been followed very closely on a weekly basis in the clinic with gentle steroid taper. Over the last few weeks, she has unresolved, recurrent UTI. Last week, she has new bowel and urinary incontinence, altered mental status with recurrent hallucination at home, progressive  weakness, falls and fatigue. Her appetite and oral intake is very poor. Family members are getting burnt out looking after her especially when she wakes up in the middle of the night, delirious. There is no witness seizures. She has stopped all therapy  sessions.  MEDICAL HISTORY:  Past Medical History:  Diagnosis Date  . Anxiety   . Bradycardia    At times with pulse in the 40s  . Brain cancer ([facility])    Glioblastoma  . Diarrhea    With blating, improved with gluten-free diet  . Diastolic dysfunction   . Essential hypertension, benign    Always has HTN when at the doctor's office.  . Glioblastoma multiforme of brain ([facility]) 07/11/2016  . Gout   . Hyperlipidemia   . Lynch syndrome   . Mild aortic sclerosis ([facility])   . Mitral valve problem    Mildly thickened mitral valve  . Mitral valve prolapse    Mild, anterior  . MR (mitral regurgitation)    ECHO 08/10/09 shows mild MR again and normal EF. No significant changes from prior ECHO.  Marland Kitchen Palpitations    Occasional, but are not significant. ECHO 08/20/08 - Normal EF (60%), mildly thickened mitral valve with mild anterior mitral valve prolapse, mild MR, mild aortic sclerosis, grade 1 diastolic dysfunction.  . Proteinuria    Likely secondary to Diabetes    SURGICAL HISTORY: Past Surgical History:  Procedure Laterality Date  . ABDOMINAL HYSTERECTOMY    . APPLICATION OF CRANIAL NAVIGATION Right 07/07/2016   Procedure: APPLICATION OF CRANIAL NAVIGATION;  Surgeon: Consuella Lose, MD;  Location: [facility];  Service: Neurosurgery;  Laterality: Right;  . BREAST LUMPECTOMY Left   . CRANIOTOMY Right 07/07/2016   Procedure: CRANIOTOMY TUMOR EXCISION w/BrainLab;  Surgeon: Consuella Lose, MD;  Location: [facility];  Service: Neurosurgery;  Laterality: Right;  . HEMORROIDECTOMY  2009  . TUBAL LIGATION      SOCIAL HISTORY: Social History   Social History  . Marital status: Married    Spouse name: Samaiya Awadallah  . Number of children: N/A  . Years of education: N/A   Occupational History  . Not on file.   Social History Main Topics  . Smoking status: Never Smoker  . Smokeless tobacco: Never Used  . Alcohol use Yes     Comment: socially  . Drug use: No  . Sexual activity: Not on file    Other Topics Concern  . Not on file   Social History Narrative  . No narrative on file    FAMILY HISTORY: Family History  Problem Relation Age of Onset  . Cancer Mother     lung cancer and colon ca  . Colon cancer Mother   . Cancer Father   . Colon cancer Father 71  . Diabetes Father     ALLERGIES:  is allergic to accupril [quinapril hcl]; latex; naproxen; vitamin e; and zoloft [sertraline hcl].  MEDICATIONS:  Current Facility-Administered Medications  Medication Dose Route Frequency Provider Last Rate Last Dose  . 0.9 %  sodium chloride infusion   Intravenous Continuous Heath Lark, MD      . acetaminophen (TYLENOL) tablet 1,000 mg  1,000 mg Oral Q6H PRN Heath Lark, MD      . acetaminophen (TYLENOL) tablet 650 mg  650 mg Oral Q4H PRN Heath Lark, MD      . Derrill Memo ON 10/06/2016] dexamethasone (DECADRON) tablet 1 mg  1 mg Oral Q12H Kentarius Partington, MD      . docusate sodium (  COLACE) capsule 100 mg  100 mg Oral BID Heath Lark, MD      . enoxaparin (LOVENOX) injection 40 mg  40 mg Subcutaneous Q24H Domani Bakos, MD      . famotidine (PEPCID) tablet 20 mg  20 mg Oral BID Heath Lark, MD      . levETIRAcetam (KEPPRA) tablet 1,000 mg  1,000 mg Oral BID Heath Lark, MD      . multivitamin tablet 1 tablet  1 tablet Oral Q supper Heath Lark, MD      . ondansetron (ZOFRAN) tablet 4-8 mg  4-8 mg Oral Q8H PRN Heath Lark, MD       Or  . ondansetron (ZOFRAN-ODT) disintegrating tablet 4-8 mg  4-8 mg Oral Q8H PRN Heath Lark, MD       Or  . ondansetron (ZOFRAN) injection 4 mg  4 mg Intravenous Q8H PRN Heath Lark, MD       Or  . ondansetron (ZOFRAN) 8 mg in sodium chloride 0.9 % 50 mL IVPB  8 mg Intravenous Q8H PRN Heath Lark, MD      . senna (SENOKOT) tablet 8.6 mg  1 tablet Oral BID Heath Lark, MD      . Vitamin D 2,000 Units  2,000 Units Oral Q supper Heath Lark, MD        REVIEW OF SYSTEMS:   Constitutional: Denies fevers, chills or abnormal night sweats Eyes: Denies blurriness of vision,  double vision or watery eyes Ears, nose, mouth, throat, and face: Denies mucositis or sore throat Respiratory: Denies cough, dyspnea or wheezes Cardiovascular: Denies palpitation, chest discomfort or lower extremity swelling Skin: Denies abnormal skin rashes Lymphatics: Denies new lymphadenopathy or easy bruising All other systems were reviewed with the patient and are negative.  PHYSICAL EXAMINATION: ECOG PERFORMANCE STATUS: 3 - Symptomatic, >50% confined to bed She is obese, appeared Cushingoid GENERAL:alert, no distress and comfortable SKIN: skin color, texture, turgor are normal, no rashes or significant lesions EYES: normal, conjunctiva are pink and non-injected, sclera clear OROPHARYNX:no exudate, no erythema and lips, buccal mucosa, and tongue normal  NECK: supple, thyroid normal size, non-tender, without nodularity LYMPH:  no palpable lymphadenopathy in the cervical, axillary or inguinal LUNGS: clear to auscultation and percussion with normal breathing effort HEART: regular rate & rhythm and no murmurs and no lower extremity edema ABDOMEN:abdomen soft, non-tender and normal bowel sounds Musculoskeletal:no cyanosis of digits and no clubbing  PSYCH: alert & oriented x 3 with fluent speech NEURO: no focal motor/sensory deficits  LABORATORY DATA:  I have reviewed the data as listed Lab Results  Component Value Date   WBC 6.9 10/05/2016   HGB 12.6 10/05/2016   HCT 37.8 10/05/2016   MCV 94.5 10/05/2016   PLT 191 10/05/2016    Recent Labs  07/18/16 0439  09/18/16 0800  09/27/16 1052 09/30/16 1235 10/05/16 1438  NA 137  < > 144  < > 139 142 141  K 3.7  < > 3.4*  < > 3.6 3.7 3.8  CL 103  --  115*  --  108  --   --   CO2 23  < > 20*  < > 24 18* 20*  GLUCOSE 118*  < > 91  < > 106* 123 132  BUN 27*  < > 21*  < > 24* 17.4 19.9  CREATININE 0.74  < > 1.15*  < > 0.87 0.7 0.8  CALCIUM 9.4  < > 8.8*  < > 8.9 8.9 9.0  GFRNONAA >  60  --  47*  --  >60  --   --   GFRAA >60  --   54*  --  >60  --   --   PROT  --   < > 5.9*  < > 6.6 6.1* 6.1*  ALBUMIN  --   < > 3.4*  < > 4.0 3.5 3.5  AST  --   < > 26  < > '20 14 14  ' ALT  --   < > 45  < > 41 42 43  ALKPHOS  --   < > 46  < > 50 59 57  BILITOT  --   < > 0.7  < > 0.4 0.37 0.28  < > = values in this interval not displayed.  RADIOGRAPHIC STUDIES: I have personally reviewed the radiological images as listed and agreed with the findings in the report. Ct Head Wo Contrast  Result Date: 09/27/2016 CLINICAL DATA:  Recent confusion, history of glioblastoma over section EXAM: CT HEAD WITHOUT CONTRAST TECHNIQUE: Contiguous axial images were obtained from the base of the skull through the vertex without intravenous contrast. COMPARISON:  09/18/2016 FINDINGS: Brain: There again noted changes in the right frontal lobe consistent with the prior resection. The overall appearance is stable from the recent exam. No findings to suggest acute hemorrhage, acute infarction or space-occupying mass lesion are noted. Vascular: No hyperdense vessel or unexpected calcification. Skull: Postsurgical changes in the right frontal bone. Sinuses/Orbits: Mild mucosal thickening is noted within the sphenoid sinus. Other: None IMPRESSION: Postsurgical changes stable from the recent exam. Mild mucosal thickening in the sphenoid sinus stable from the prior study. Electronically Signed   By: Inez Catalina M.D.   On: 09/27/2016 11:18   Ct Head Wo Contrast  Result Date: 09/18/2016 CLINICAL DATA:  Weakness. EXAM: CT HEAD WITHOUT CONTRAST TECHNIQUE: Contiguous axial images were obtained from the base of the skull through the vertex without intravenous contrast. COMPARISON:  06/30/2016 head CT.  Postoperative brain MRI 07/07/2016 FINDINGS: Brain: Expected appearance of resection cavity in the right frontal lobe with neutral mass effect. The periphery has calcification and mildly dense material correlating with blood products on previous MRI. More posteriorly is a hazy area  of high density which may have progressed from prior CT, most consistent with cellular tumor in this patient with history of glioblastoma. No evidence of acute infarct, hemorrhage, or hydrocephalus. There is no midline shift or herniation. Moderate white matter disease. Vascular: Atherosclerosis.  No hyperdense vessel. Skull: Expected changes of right frontal craniotomy. Sinuses/Orbits: Minimal mucus in the left sphenoid sinus. IMPRESSION: 1. No acute finding. 2. History of glioblastoma with right frontal resection cavity and adjacent dense cellular tumor. No acute hemorrhage or significant mass effect. Electronically Signed   By: Monte Fantasia M.D.   On: 09/18/2016 11:09    ASSESSMENT & PLAN:   Glioblastoma multiforme of brain [facility]) She has completed concurrent chemoradiation treatment. She had a recent CT imaging study done in the emergency department which showed no significant acute changes. However, with generalized progressive decline in neurological function, I plan to order MRI brain with and without contrast tomorrow She is not ready to begin maintenance Temodar I plan to continue dexamethasone at 2 mg in the morning and 1 mg in the afternoon  She will resume Bactrim for PCP prophylaxis next week after she completes antibiotic therapy for urinary tract infection  Recurrent UTI The patient had recurrent urinary tract infection with multiple species of bacteria. On  09/18/2016, she had Citrobacter that was resistant to Bactrim but sensitive to Cipro. On 09/27/2016, she had E. coli UTI that is resistant to both Bactrim and Cipro but sensitive to penicillin type antibiotic treatment. As of 09/29/2016, antibiotic therapy is switched to Augmentin. Due to incomplete resolution of UTI, I had planned to treat her with Augmentin for total of 10 days with repeat urinalysis and urine culture now to ensure resolution of her UTI. She has poor bladder control with retention and incontinence and not able to  give a urine sample Will order Foley catheter placement to obtain urine sample  Poor oral intake, dehydration I will start her on gentle IVF hydration  Altered mental status Either due to progression of disease, infection or other causes I will order CXR, blood culture and urine culture MRI as above Will consider EEG if MRI unremarkable Haldol prn  Urinary and fecal incontinence Will order Foley Will order abdominal X ray to exclude overflow incontinence Will order MRI  Weakness, fatigue Will consult PT for evaluation  Other constipation She had recent constipation. We discussed laxatives regimen.  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 Will get PT to assess MRI as above  DVT prophylaxis Lovenox  Goals of care Full Code for now  Discharge planning She has progressive decline. May need SNF upon discharge once her work-up is complete Will likely be here at the end of the week  All questions were answered.   Heath Lark, MD 10/05/2016 5:21 PM

## 2016-10-06 ENCOUNTER — Inpatient Hospital Stay (HOSPITAL_COMMUNITY): Payer: Medicare Other

## 2016-10-06 MED ORDER — POLYETHYLENE GLYCOL 3350 17 G PO PACK
17.0000 g | PACK | Freq: Every day | ORAL | Status: DC
  Administered 2016-10-06 – 2016-10-10 (×5): 17 g via ORAL
  Filled 2016-10-06 (×5): qty 1

## 2016-10-06 MED ORDER — GADOBENATE DIMEGLUMINE 529 MG/ML IV SOLN
20.0000 mL | Freq: Once | INTRAVENOUS | Status: AC | PRN
  Administered 2016-10-06: 19 mL via INTRAVENOUS

## 2016-10-06 NOTE — Progress Notes (Signed)
PT Cancellation Note  Patient Details Name: Crystal Parsons MRN: [id number] DOB: 15-May-1946   Cancelled Treatment:    Reason Eval/Treat Not Completed: Patient at procedure or test/unavailable. Will check back as schedule allows. Thanks.   Weston Anna, MPT Pager: [phone]

## 2016-10-06 NOTE — Progress Notes (Signed)
I saw the patient again around 330pm.  I reviewed results of urine culture, blood culture, chest x-ray, abdominal x-ray and MRI brain with the patient, her husband and another family member. MRI showed evidence of enlarging lesion.  I have discussed this with radiation oncologist who felt that it could represent possible pseudo-progression.  The case will be discussed at the next CNS oncology tumor board next week. In the meantime, the patient feels a little stronger. She had one bowel movement early this morning. I reviewed recommendation from physical therapist.  She needs to be discharged to a skilled nursing facility when able. If urine culture and blood culture came back negative by tomorrow morning, I will get the Foley catheter discontinued.  I have consulted Education officer, museum for skilled nursing facility placement.  I have addressed all their questions and concerns. Total contact time with the patient today is 40 minutes

## 2016-10-06 NOTE — Evaluation (Signed)
Physical Therapy Evaluation Patient Details Name: ZAYLIE GISLER MRN: [id number] DOB: 11-07-1945 Today's Date: 10/06/2016   History of Present Illness  71 yo female admitted with AMS, weakness, UTI. Hx of glioblastoma, gout, Sz, bradycardia, recent falls.   Clinical Impression  On eval, pt required Min assist for mobility. She walked ~5 feet in the room. Pt was very unsteady. Repeated cueing required for participation (OOB/ambulation). No family present during session. Recommend ST rehab at SNF unless family feels they can mange with pt at current level.     Follow Up Recommendations SNF    Equipment Recommendations   (continuing to assess)    Recommendations for Other Services       Precautions / Restrictions Precautions Precautions: Fall Restrictions Weight Bearing Restrictions: No      Mobility  Bed Mobility Overal bed mobility: Needs Assistance Bed Mobility: Supine to Sit;Sit to Supine     Supine to sit: Min guard Sit to supine: Min assist   General bed mobility comments: Assist for LEs back onto bed. Increased time.   Transfers Overall transfer level: Needs assistance   Transfers: Sit to/from Stand Sit to Stand: Min assist         General transfer comment: Assist to rise, stabilize, control descent.   Ambulation/Gait Ambulation/Gait assistance: Min assist Ambulation Distance (Feet): 5 Feet Assistive device: 1 person hand held assist Gait Pattern/deviations: Wide base of support;Decreased step length - right;Decreased step length - left;Decreased stride length     General Gait Details: Very unsteady. Pt reaching out for other objects in room to hold on to.   Stairs            Wheelchair Mobility    Modified Rankin (Stroke Patients Only)       Balance Overall balance assessment: Needs assistance;History of Falls           Standing balance-Leahy Scale: Poor                               Pertinent Vitals/Pain Pain  Assessment: Faces Faces Pain Scale: Hurts a little bit Pain Location: abdomen Pain Intervention(s): Monitored during session    Home Living Family/patient expects to be discharged to:: Unsure Living Arrangements: Spouse/significant other Available Help at Discharge: Family;Available 24 hours/day Type of Home: House Home Access: Stairs to enter Entrance Stairs-Rails: None Entrance Stairs-Number of Steps: 2 + 1 with fence post to hold on to  Home Layout: One level Home Equipment: None      Prior Function Level of Independence: Independent               Hand Dominance        Extremity/Trunk Assessment   Upper Extremity Assessment Upper Extremity Assessment: Overall WFL for tasks assessed    Lower Extremity Assessment Lower Extremity Assessment: Generalized weakness    Cervical / Trunk Assessment Cervical / Trunk Assessment: Normal  Communication   Communication: No difficulties  Cognition Arousal/Alertness: Awake/alert Behavior During Therapy: WFL for tasks assessed/performed Overall Cognitive Status: No family/caregiver present to determine baseline cognitive functioning                      General Comments      Exercises     Assessment/Plan    PT Assessment Patient needs continued PT services  PT Problem List Decreased strength;Decreased mobility;Decreased activity tolerance;Decreased balance;Decreased cognition;Decreased knowledge of use of DME  PT Treatment Interventions DME instruction;Gait training;Therapeutic exercise;Therapeutic activities;Patient/family education;Functional mobility training;Balance training    PT Goals (Current goals can be found in the Care Plan section)  Acute Rehab PT Goals Patient Stated Goal: none stated.  PT Goal Formulation: With patient Time For Goal Achievement: 10/20/16 Potential to Achieve Goals: Good    Frequency Min 3X/week   Barriers to discharge        Co-evaluation                End of Session Equipment Utilized During Treatment: Gait belt Activity Tolerance: Patient tolerated treatment well Patient left: in bed;with call bell/phone within reach;with bed alarm set   PT Visit Diagnosis: Difficulty in walking, not elsewhere classified (R26.2);Muscle weakness (generalized) (M62.81)         Time: 2194-7125 PT Time Calculation (min) (ACUTE ONLY): 8 min   Charges:   PT Evaluation $PT Eval Low Complexity: 1 Procedure     PT G Codes:         Weston Anna, MPT Pager: [phone]

## 2016-10-06 NOTE — Progress Notes (Signed)
Crystal Parsons   DOB:1945/09/28   ZO#:[id number]    Subjective: She feels well.  She has minimum headaches.  Foley catheter was placed yesterday.  Urine culture, blood culture, chest x-ray and abdominal x-ray were performed yesterday.  Nursing staff from last night did not revealed any confusion or delirium overnight. However, the patient is found half naked this morning.  She could not remember why her clothing appears to be pushed to one side. No reported seizures  Objective:  Vitals:   10/05/16 2150 10/06/16 0500  BP: 119/72 (!) 151/70  Pulse: 83 76  Resp: 20 20  Temp: 98 F (36.7 C) 97.9 F (36.6 C)     Intake/Output Summary (Last 24 hours) at 10/06/16 0840 Last data filed at 10/06/16 0405  Gross per 24 hour  Intake              480 ml  Output              500 ml  Net              -20 ml    GENERAL:alert, no distress and comfortable.  She looks cushingoid SKIN: skin color, texture, turgor are normal, no rashes or significant lesions EYES: normal, Conjunctiva are pink and non-injected, sclera clear OROPHARYNX:no exudate, no erythema and lips, buccal mucosa, and tongue normal  NECK: supple, thyroid normal size, non-tender, without nodularity LYMPH:  no palpable lymphadenopathy in the cervical, axillary or inguinal LUNGS: clear to auscultation and percussion with normal breathing effort HEART: regular rate & rhythm and no murmurs and no lower extremity edema ABDOMEN:abdomen soft, non-tender and normal bowel sounds Musculoskeletal:no cyanosis of digits and no clubbing  NEURO: alert & oriented x 3 with fluent speech, no focal motor/sensory deficits   Labs:  Lab Results  Component Value Date   WBC 6.9 10/05/2016   HGB 12.6 10/05/2016   HCT 37.8 10/05/2016   MCV 94.5 10/05/2016   PLT 191 10/05/2016   NEUTROABS 4.8 10/05/2016    Lab Results  Component Value Date   NA 141 10/05/2016   K 3.8 10/05/2016   CL 108 09/27/2016   CO2 20 (L) 10/05/2016    Studies: I have  reviewed the imaging studies Dg Chest 2 View  Result Date: 10/05/2016 CLINICAL DATA:  Recent completion of treatment for glioblastoma history of diastolic dysfunction EXAM: CHEST  2 VIEW COMPARISON:  07/07/2016 FINDINGS: Linear atelectasis at the lingula and bilateral lung bases. No acute consolidation or effusion. Normal heart size. No pneumothorax. IMPRESSION: Linear atelectasis at the lingula and bilateral lung bases. Otherwise negative examination Electronically Signed   By: Donavan Foil M.D.   On: 10/05/2016 22:25   Dg Abd 2 Views  Result Date: 10/05/2016 CLINICAL DATA:  Overflow incontinence EXAM: ABDOMEN - 2 VIEW COMPARISON:  07/01/2016 FINDINGS: Atelectasis at the left lung base. No free air beneath the diaphragm. Nonobstructed bowel gas pattern with moderate stool. Ovoid soft tissue opacity in the pelvis likely reflects bladder. This does not appear significantly enlarged. Small calcified pelvic phleboliths. IMPRESSION: Nonobstructed bowel-gas pattern with moderate stool. Electronically Signed   By: Donavan Foil M.D.   On: 10/05/2016 22:23    Assessment & Plan:   Glioblastoma multiforme of brain [facility]) She has completed concurrent chemoradiation treatment. She had a recent CT imaging study done in the emergency department which showed no significant acute changes. However, with generalized progressive decline in neurological function, I plan to order MRI brain with and without contrast today  She is not ready to begin maintenance Temodar I plan to continue dexamethasone at 2 mg in the morning and 1 mg in the afternoon  She will resume Bactrim for PCP prophylaxis next week after she completes antibiotic therapy for urinary tract infection  Recurrent UTI The patient had recurrent urinary tract infection with multiple species of bacteria. On 09/18/2016, she had Citrobacter that was resistant to Bactrim but sensitive to Cipro. On 09/27/2016, she had E. coli UTI that is resistant to both  Bactrim and Cipro but sensitive to penicillin type antibiotic treatment. As of 09/29/2016, antibiotic therapy is switched to Augmentin. Due to incomplete resolution of UTI, I had planned to treat her with Augmentin for total of 10 days with repeat urinalysis and urine culture now to ensure resolution of her UTI. Urine culture is pending She has poor bladder control with retention and incontinence and not able to give a urine sample I recommend we continue Foley catheter for 1 more day  Poor oral intake, dehydration I will start her on gentle IVF hydration  Altered mental status Either due to progression of disease, infection or other causes Chest x-ray is unremarkable.  Blood culture and urine culture are pending MRI as above Will consider EEG if MRI unremarkable Haldol prn  Urinary and fecal incontinence Will order Foley Abdominal x-ray show moderate stool burden.  I will start her on aggressive laxative therapy Will order MRI to exclude progression of disease as a reason of her losing bowels and bladder control  Weakness, fatigue Will consult PT for evaluation  Other constipation She had recent constipation. We discussed laxatives regimen.  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 Will get PT to assess MRI as above  DVT prophylaxis Lovenox  Goals of care Full Code for now I will probably consult palliative care once we have more information available  Discharge planning She has progressive decline. May need SNF upon discharge once her work-up is complete Will likely be here at the end of the week  Heath Lark, MD 10/06/2016  8:40 AM

## 2016-10-06 NOTE — Clinical Social Work Note (Signed)
Clinical Social Work Assessment  Patient Details  Name: Crystal Parsons MRN: [id number] Date of Birth: 23-Apr-1946  Date of referral:  10/06/16               Reason for consult:  Facility Placement                Permission sought to share information with:  Facility Sport and exercise psychologist, Family Supports Permission granted to share information::  Yes, Verbal Permission Granted  Name::        Agency::     Relationship::     Contact Information:     Housing/Transportation Living arrangements for the past 2 months:  [place] of Information:  Spouse Patient Interpreter Needed:  None Criminal Activity/Legal Involvement Pertinent to Current Situation/Hospitalization:    Significant Relationships:  Spouse Lives with:  Spouse Do you feel safe going back to the place where you live?  Yes Need for family participation in patient care:  Yes (Comment)  Care giving concerns:  None listed by pt/family   Social Worker assessment / plan:  *CSW spoke by phone with pt's spouse Crystal Parsons at ph: [phone] and confirmed pt's husband's plan to be discharged to SNF to for short-term rehab.  CSW provided active listening and validated pt's concerns that pt receive excellent care.   CSW stated to pt's husband pt's CSW will be in contact with the pt's husband with available facilities in [place] and surrounding area.  Pt has been living independently prior to being admitted to [facility] with her husband.  Employment status:  Retired Health visitor, Managed Care PT Recommendations:  [place] / Referral to community resources:     Patient/Family's Response to care:  Patient not alert and oriented, per notes  Patient's husband agreeable to plan.  Pt's husband supportive and strongly involved in pt.'s care.  Pt's husband pleasant and appreciated CSW intervention.    Patient/Family's Understanding of and Emotional Response to Diagnosis,  Current Treatment, and Prognosis: Still assessing * Emotional Assessment Appearance:  Other (Comment Required (Unable to assess due to AMS) Attitude/Demeanor/Rapport:    Affect (typically observed):  Unable to Assess Orientation:  Oriented to Self, Oriented to Place, Oriented to Situation, Oriented to  Time Alcohol / Substance use:    Psych involvement (Current and /or in the community):     Discharge Needs  Concerns to be addressed:    Readmission within the last 30 days:    Current discharge risk:  None Barriers to Discharge:  No Barriers Identified   Crystal Parsons, LCSWA 10/06/2016, 11:02 PM

## 2016-10-07 ENCOUNTER — Encounter (HOSPITAL_COMMUNITY): Payer: Self-pay

## 2016-10-07 LAB — URINE CULTURE: CULTURE: NO GROWTH

## 2016-10-07 MED ORDER — SULFAMETHOXAZOLE-TRIMETHOPRIM 800-160 MG PO TABS
1.0000 | ORAL_TABLET | ORAL | Status: DC
  Administered 2016-10-07 – 2016-10-10 (×2): 1 via ORAL
  Filled 2016-10-07 (×2): qty 1

## 2016-10-07 MED ORDER — LEVETIRACETAM 500 MG PO TABS
500.0000 mg | ORAL_TABLET | Freq: Two times a day (BID) | ORAL | Status: DC
  Administered 2016-10-07 – 2016-10-10 (×7): 500 mg via ORAL
  Filled 2016-10-07 (×7): qty 1

## 2016-10-07 NOTE — NC FL2 (Signed)
[place] LEVEL OF CARE SCREENING TOOL     IDENTIFICATION  Patient Name: Crystal Parsons Birthdate: 09-Jul-1946 Sex: female Admission Date (Current Location): 10/05/2016  [facility] and [place] Number:  Herbalist and Address:  [facility],  [place] [address], [place]      Provider Number: [id number]  Attending Physician Name and Address:  Heath Lark, MD  Relative Name and Phone Number:       Current Level of Care: Hospital Recommended Level of Care: [place] Prior Approval Number:    Date Approved/Denied:   PASRR Number:   [id number]  Discharge Plan: SNF    Current Diagnoses: Patient Active Problem List   Diagnosis Date Noted  . Incontinence of feces 10/05/2016  . Altered mental status 10/05/2016  . Recurrent falls 09/22/2016  . Thrombocytopenia ([place]) 09/13/2016  . Depression, major, single episode, mild ([place]) 09/13/2016  . Skin rash 08/30/2016  . Elevated liver enzymes 08/23/2016  . Anxiety attack 08/16/2016  . Other constipation 08/09/2016  . 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 Polyposis syndrome, familial 07/20/2016  . H/O: hysterectomy 07/20/2016  . Leukocytosis   . Monocytosis   . Acute lower UTI   . GBM (glioblastoma multiforme) ([place])   . Glioblastoma multiforme of brain ([place]) 07/11/2016  . MR (mitral regurgitation)   . Brain tumor ([place])   . Apraxia   . Left-sided weakness   . Steroid-induced hyperglycemia   . Leucocytosis   . Benign essential HTN   . History of gout   . Dysuria   . Acute respiratory failure with hypoxia ([place])   . Brain mass   . Seizure ([place]) 06/30/2016  . Status epilepticus ([place]) 06/30/2016    Orientation RESPIRATION BLADDER Height & Weight     Self  Normal Indwelling catheter, Continent Weight: 200 lb (90.7 kg) Height:  5\' 2"  (157.5 cm)  BEHAVIORAL SYMPTOMS/MOOD NEUROLOGICAL BOWEL NUTRITION STATUS      Incontinent Diet (please see DC summary)  AMBULATORY STATUS  COMMUNICATION OF NEEDS Skin   Extensive Assist Verbally Normal                       Personal Care Assistance Level of Assistance  Bathing, Feeding, Dressing Bathing Assistance: Maximum assistance Feeding assistance: Limited assistance Dressing Assistance: Limited assistance     Functional Limitations Info  Sight, Hearing, Speech Sight Info: Adequate Hearing Info: Adequate Speech Info: Adequate    SPECIAL CARE FACTORS FREQUENCY  PT (By licensed PT), OT (By licensed OT)     PT Frequency: 5x OT Frequency: 5x            Contractures Contractures Info: Not present    Additional Factors Info  Code Status, Allergies Code Status Info: Full Code Allergies Info: Accupril Quinapril Hcl, Latex, Naproxen, Vitamin E, Zoloft Sertraline Hcl           Current Medications (10/07/2016):  This is the current hospital active medication list Current Facility-Administered Medications  Medication Dose Route Frequency Provider Last Rate Last Dose  . acetaminophen (TYLENOL) tablet 650 mg  650 mg Oral Q4H PRN Heath Lark, MD   650 mg at 10/06/16 1310  . cholecalciferol (VITAMIN D) tablet 2,000 Units  2,000 Units Oral Q supper Heath Lark, MD   2,000 Units at 10/06/16 1753  . dexamethasone (DECADRON) tablet 1 mg  1 mg Oral Q12H Heath Lark, MD   1 mg at 10/06/16 2258  . docusate  sodium (COLACE) capsule 100 mg  100 mg Oral BID Heath Lark, MD   100 mg at 10/06/16 2256  . enoxaparin (LOVENOX) injection 40 mg  40 mg Subcutaneous Q24H Heath Lark, MD   40 mg at 10/06/16 2256  . famotidine (PEPCID) tablet 20 mg  20 mg Oral BID Heath Lark, MD   20 mg at 10/06/16 2256  . haloperidol lactate (HALDOL) injection 1 mg  1 mg Intravenous Q6H PRN Heath Lark, MD      . levETIRAcetam (KEPPRA) tablet 500 mg  500 mg Oral BID Heath Lark, MD      . multivitamin with minerals tablet 1 tablet  1 tablet Oral Q supper Heath Lark, MD   1 tablet at 10/06/16 1753  . ondansetron (ZOFRAN) tablet 4-8 mg  4-8 mg Oral Q8H PRN  Heath Lark, MD       Or  . ondansetron (ZOFRAN-ODT) disintegrating tablet 4-8 mg  4-8 mg Oral Q8H PRN Heath Lark, MD       Or  . ondansetron (ZOFRAN) injection 4 mg  4 mg Intravenous Q8H PRN Heath Lark, MD       Or  . ondansetron (ZOFRAN) 8 mg in sodium chloride 0.9 % 50 mL IVPB  8 mg Intravenous Q8H PRN Ni Gorsuch, MD      . polyethylene glycol (MIRALAX / GLYCOLAX) packet 17 g  17 g Oral Daily Heath Lark, MD   17 g at 10/06/16 1109  . senna (SENOKOT) tablet 8.6 mg  1 tablet Oral BID Heath Lark, MD   8.6 mg at 10/06/16 2256  . sulfamethoxazole-trimethoprim (BACTRIM DS,SEPTRA DS) 800-160 MG per tablet 1 tablet  1 tablet Oral Once per day on Mon Wed Fri Heath Lark, MD         Discharge Medications: Please see discharge summary for a list of discharge medications.  Relevant Imaging Results:  Relevant Lab Results:   Additional Information SSN:  [id number]  [place], [place]

## 2016-10-07 NOTE — Progress Notes (Signed)
D/c'ed foley catheter. Patient tolerated well. Catheter intact.

## 2016-10-07 NOTE — Progress Notes (Signed)
Crystal Parsons   DOB:24-Aug-1945   WP#:[id number]    Subjective: She feels well.  She had a good night.  No document of falls, seizures or headaches   Objective:  Vitals:   10/06/16 2057 10/07/16 0652  BP: (!) 156/68 139/74  Pulse: 88 78  Resp: 18 18  Temp: 97.9 F (36.6 C) 97.5 F (36.4 C)     Intake/Output Summary (Last 24 hours) at 10/07/16 0810 Last data filed at 10/07/16 5053  Gross per 24 hour  Intake             2465 ml  Output             3000 ml  Net             -535 ml    GENERAL:alert, no distress and comfortable SKIN: skin color, texture, turgor are normal, no rashes or significant lesions EYES: normal, Conjunctiva are pink and non-injected, sclera clear Musculoskeletal:no cyanosis of digits and no clubbing  NEURO: alert & oriented x 3 with fluent speech, no focal motor/sensory deficits   Labs:  Lab Results  Component Value Date   WBC 6.9 10/05/2016   HGB 12.6 10/05/2016   HCT 37.8 10/05/2016   MCV 94.5 10/05/2016   PLT 191 10/05/2016   NEUTROABS 4.8 10/05/2016    Lab Results  Component Value Date   NA 141 10/05/2016   K 3.8 10/05/2016   CL 108 09/27/2016   CO2 20 (L) 10/05/2016    Studies:  Dg Chest 2 View  Result Date: 10/05/2016 CLINICAL DATA:  Recent completion of treatment for glioblastoma history of diastolic dysfunction EXAM: CHEST  2 VIEW COMPARISON:  07/07/2016 FINDINGS: Linear atelectasis at the lingula and bilateral lung bases. No acute consolidation or effusion. Normal heart size. No pneumothorax. IMPRESSION: Linear atelectasis at the lingula and bilateral lung bases. Otherwise negative examination Electronically Signed   By: Donavan Foil M.D.   On: 10/05/2016 22:25   Mr Jeri Cos ZJ Contrast  Result Date: 10/06/2016 CLINICAL DATA:  GBM.  Neurologic decline. EXAM: MRI HEAD WITHOUT AND WITH CONTRAST TECHNIQUE: Multiplanar, multiecho pulse sequences of the brain and surrounding structures were obtained without and with intravenous contrast.  CONTRAST:  48mL MULTIHANCE GADOBENATE DIMEGLUMINE 529 MG/ML IV SOLN COMPARISON:  07/07/2016 FINDINGS: Brain: Evolution/maturation of right frontal resection cavity containing blood products. Midline shift is resolved. No suspicious enhancement in the cavity wall. Enhancing mass in the right centrum semiovale has progressed, now 12 mm in diameter, previously 6 mm. Central necrotic features again seen. Progressively confluent FLAIR hyperintensity in the centrum semiovale and at the genu of the corpus callosum. Progress FLAIR hyperintensity asymmetrically about the right frontal resection cavity. No acute infarct, hemorrhage, or hydrocephalus. Vascular: Preserved flow voids Skull and upper cervical spine: Negative Sinuses/Orbits: Negative IMPRESSION: 1. Mildly increased volume of enhancing tumor in the right centrum semiovale, now 12 mm. 2. Progressive bilateral cerebral white matter FLAIR hyperintensity, progressive nonenhancing tumor and/or treatment related changes. There is a background of extensive chronic microvascular disease. 3. Stable satisfactory appearance of the right frontal resection cavity Electronically Signed   By: Monte Fantasia M.D.   On: 10/06/2016 10:34   Dg Abd 2 Views  Result Date: 10/05/2016 CLINICAL DATA:  Overflow incontinence EXAM: ABDOMEN - 2 VIEW COMPARISON:  07/01/2016 FINDINGS: Atelectasis at the left lung base. No free air beneath the diaphragm. Nonobstructed bowel gas pattern with moderate stool. Ovoid soft tissue opacity in the pelvis likely reflects bladder. This does  not appear significantly enlarged. Small calcified pelvic phleboliths. IMPRESSION: Nonobstructed bowel-gas pattern with moderate stool. Electronically Signed   By: Donavan Foil M.D.   On: 10/05/2016 22:23    Assessment & Plan:   Glioblastoma multiforme of brain [facility]) She has completed concurrent chemoradiation treatment. She had a recent CT imaging study done in the emergency department which showed no  significant acute changes.  MRI brain show progression of one lesion but per discussion with radiation oncologist, it could be due to pseudo-progression.  Her imaging study will be review at the next CNS oncology tumor board We reduced Keppra to 500 mg twice a day due to stable MRI findings She is not ready to begin maintenance Temodar I plan to continue dexamethasone taper at 1 mg twice a day We will resume Bactrim for PCP prophylaxis  Recurrent UTI The patient had recurrent urinary tract infection with multiple species of bacteria. On 09/18/2016, she had Citrobacter that was resistant to Bactrim but sensitive to Cipro. On 09/27/2016, she had E. coli UTI that is resistant to both Bactrim and Cipro but sensitive to penicillin type antibiotic treatment. As of 09/29/2016, antibiotic therapy is switched to Augmentin. Due to incomplete resolution of UTI, I had plannedto treat her with Augmentin for total of 10 days with repeat urinalysis and urine culture nowto ensure resolution of her UTI. Urine culture is negative so far We will discontinue Foley catheter  Altered mental status, stable Either due to progression of disease, infection or other causes Chest x-ray is unremarkable.  Blood culture and urine culture are pending MRI as above Haldol prn  Urinary and fecal incontinence, stable We will discontinue Foley Abdominal x-ray show moderate stool burden.    She had good bowel movement yesterday  Weakness, fatigue Appreciate PT consult   Other constipation She had recent constipation. We discussed laxatives regimen.  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 Appreciate PT consult She will benefit from skilled nursing facility upon discharge  DVT prophylaxis Lovenox  Goals of care Full Code for now  Discharge planning Consulted social worker for SNF placement Will likely be here at the end of the week  Heath Lark,  MD 10/07/2016  8:10 AM

## 2016-10-07 NOTE — Progress Notes (Signed)
Physical Therapy Treatment Patient Details Name: Crystal Parsons MRN: [id number] DOB: 29-May-1946 Today's Date: 10/07/2016    History of Present Illness 71 yo female admitted with AMS, weakness, UTI. Hx of glioblastoma, gout, Sz, bradycardia, recent falls.     PT Comments    Pt assisted with ambulating in hallway today and pt reports feeling better today then yesterday.  Family present today (brothers) and state pt is not at her mobility baseline.  Pt was able to ambulate and perform ADLs on her own however occasionally needed rest breaks due to SOB.  Family reports plan is for SNF at this time.   Follow Up Recommendations  SNF     Equipment Recommendations  None recommended by PT    Recommendations for Other Services       Precautions / Restrictions Precautions Precautions: Fall    Mobility  Bed Mobility Overal bed mobility: Needs Assistance Bed Mobility: Supine to Sit     Supine to sit: Min guard     General bed mobility comments: provided a hand for pt to self assist upright  Transfers Overall transfer level: Needs assistance Equipment used: None Transfers: Sit to/from Stand Sit to Stand: Min assist         General transfer comment: assist to rise and steady, verbal cues for safe technique  Ambulation/Gait Ambulation/Gait assistance: Min assist Ambulation Distance (Feet): 160 Feet Assistive device: 1 person hand held assist Gait Pattern/deviations: Wide base of support;Decreased stride length;Step-through pattern     General Gait Details: pt declined using assistive device however reaching for UE support on furniture and hand rails, provided 1 HHA as well, assist for steadying (hand gel prior to ambulating in hallway and washed hands upon return to room)   Stairs            Wheelchair Mobility    Modified Rankin (Stroke Patients Only)       Balance           Standing balance support: No upper extremity supported;During functional  activity Standing balance-Leahy Scale: Poor Standing balance comment: requires UE support, props elbows on sink to wash hands                    Cognition Arousal/Alertness: Awake/alert Behavior During Therapy: WFL for tasks assessed/performed                   General Comments: cognition appears at baseline, family reports mobility is not at baseline    Exercises      General Comments        Pertinent Vitals/Pain Pain Assessment: No/denies pain    Home Living                      Prior Function            PT Goals (current goals can now be found in the care plan section) Progress towards PT goals: Progressing toward goals    Frequency    Min 3X/week      PT Plan Current plan remains appropriate    Co-evaluation             End of Session Equipment Utilized During Treatment: Gait belt Activity Tolerance: Patient tolerated treatment well Patient left: in bed;with call bell/phone within reach;with family/visitor present (sitting EOB with family, family agreeable to call for assist if needed) Nurse Communication: Mobility status PT Visit Diagnosis: Difficulty in walking, not elsewhere classified (R26.2)  Time: 8006-3494 PT Time Calculation (min) (ACUTE ONLY): 17 min  Charges:  $Gait Training: 8-22 mins                    G Codes:       Sirenia Whitis,KATHrine E 10/07/2016, 12:52 PM Carmelia Bake, PT, DPT 10/07/2016 Pager: [phone]

## 2016-10-07 NOTE — Consult Note (Signed)
   [facility] CM Inpatient Consult   10/07/2016  Crystal Parsons 04-23-1946 [id number]    Patient screened for potential [facility] Care Management services. Chart reviewed. Noted current discharge plan is for SNF.  There are no identifiable [facility] Care Management needs at this time. If patient's post hospital needs change, please place a [facility] Care Management consult. For questions please contact:  Marthenia Rolling, [place], RN,BSN [facility] Liaison [phone]

## 2016-10-07 NOTE — Clinical Social Work Placement (Signed)
   CLINICAL SOCIAL WORK PLACEMENT  NOTE  Date:  10/07/2016  Patient Details  Name: Crystal Parsons MRN: [id number] Date of Birth: Sep 14, 1945  Clinical Social Work is seeking post-discharge placement for this patient at the [place] level of care (*CSW will initial, date and re-position this form in  chart as items are completed):  Yes   Patient/family provided with [place] Work Department's list of facilities offering this level of care within the geographic area requested by the patient (or if unable, by the patient's family).  Yes   Patient/family informed of their freedom to choose among providers that offer the needed level of care, that participate in Medicare, Medicaid or managed care program needed by the patient, have an available bed and are willing to accept the patient.  Yes   Patient/family informed of [facility]'s ownership interest in [facility] and [facility], as well as of the fact that they are under no obligation to receive care at these facilities.  PASRR submitted to EDS on 10/07/16     PASRR number received on 10/07/16     Existing PASRR number confirmed on       FL2 transmitted to all facilities in geographic area requested by pt/family on 10/07/16     FL2 transmitted to all facilities within larger geographic area on       Patient informed that his/her managed care company has contracts with or will negotiate with certain facilities, including the following:            Patient/family informed of bed offers received.  Patient chooses bed at       Physician recommends and patient chooses bed at      Patient to be transferred to   on  .  Patient to be transferred to facility by       Patient family notified on   of transfer.  Name of family member notified:        PHYSICIAN Please sign FL2     Additional Comment:    _______________________________________________ [place], LCSW 10/07/2016,  10:11 AM

## 2016-10-08 DIAGNOSIS — R159 Full incontinence of feces: Secondary | ICD-10-CM

## 2016-10-08 DIAGNOSIS — C719 Malignant neoplasm of brain, unspecified: Principal | ICD-10-CM

## 2016-10-08 DIAGNOSIS — K5900 Constipation, unspecified: Secondary | ICD-10-CM

## 2016-10-08 DIAGNOSIS — Z87440 Personal history of urinary (tract) infections: Secondary | ICD-10-CM

## 2016-10-08 NOTE — Progress Notes (Signed)
Nursing Note: Pt's bed alrm sounding frequently.Entered order for telesitter but none available at this time.Will continue to monitor pt.wbb

## 2016-10-08 NOTE — Progress Notes (Signed)
ELIABETH SHOFF   DOB:09/11/1945   ZO#:[id number]    Subjective: She feels OK. Slept well. No headaches or new neurological deficit  Objective:  Vitals:   10/07/16 2203 10/08/16 0655  BP: 137/70 129/77  Pulse: 71 66  Resp: 16 14  Temp: 98 F (36.7 C) 97.7 F (36.5 C)     Intake/Output Summary (Last 24 hours) at 10/08/16 0714 Last data filed at 10/08/16 0655  Gross per 24 hour  Intake              700 ml  Output              300 ml  Net              400 ml    GENERAL:alert, no distress and comfortable. Looks Cushingoid SKIN: skin color, texture, turgor are normal, no rashes or significant lesions EYES: normal, Conjunctiva are pink and non-injected, sclera clear Musculoskeletal:no cyanosis of digits and no clubbing  NEURO: alert & oriented x 3 with fluent speech, no focal motor/sensory deficits   Labs:  Lab Results  Component Value Date   WBC 6.9 10/05/2016   HGB 12.6 10/05/2016   HCT 37.8 10/05/2016   MCV 94.5 10/05/2016   PLT 191 10/05/2016   NEUTROABS 4.8 10/05/2016    Lab Results  Component Value Date   NA 141 10/05/2016   K 3.8 10/05/2016   CL 108 09/27/2016   CO2 20 (L) 10/05/2016    Studies:  Mr Jeri Cos JW Contrast  Result Date: 10/06/2016 CLINICAL DATA:  GBM.  Neurologic decline. EXAM: MRI HEAD WITHOUT AND WITH CONTRAST TECHNIQUE: Multiplanar, multiecho pulse sequences of the brain and surrounding structures were obtained without and with intravenous contrast. CONTRAST:  90mL MULTIHANCE GADOBENATE DIMEGLUMINE 529 MG/ML IV SOLN COMPARISON:  07/07/2016 FINDINGS: Brain: Evolution/maturation of right frontal resection cavity containing blood products. Midline shift is resolved. No suspicious enhancement in the cavity wall. Enhancing mass in the right centrum semiovale has progressed, now 12 mm in diameter, previously 6 mm. Central necrotic features again seen. Progressively confluent FLAIR hyperintensity in the centrum semiovale and at the genu of the corpus  callosum. Progress FLAIR hyperintensity asymmetrically about the right frontal resection cavity. No acute infarct, hemorrhage, or hydrocephalus. Vascular: Preserved flow voids Skull and upper cervical spine: Negative Sinuses/Orbits: Negative IMPRESSION: 1. Mildly increased volume of enhancing tumor in the right centrum semiovale, now 12 mm. 2. Progressive bilateral cerebral white matter FLAIR hyperintensity, progressive nonenhancing tumor and/or treatment related changes. There is a background of extensive chronic microvascular disease. 3. Stable satisfactory appearance of the right frontal resection cavity Electronically Signed   By: Monte Fantasia M.D.   On: 10/06/2016 10:34    Assessment & Plan:   Glioblastoma multiforme of brain [facility]) She has completed concurrent chemoradiation treatment. She had a recent CT imaging study done in the emergency department which showed no significant acute changes.  MRI brain show progression of one lesion but per discussion with radiation oncologist, it could be due to pseudo-progression.  Her imaging study will be review at the next CNS oncology tumor board We reduced Keppra to 500 mg twice a day due to stable MRI findings She is not ready to begin maintenance Temodar I plan to continue dexamethasone taper at 1 mg twice a day, to be further tapered next week We will resume Bactrim for PCP prophylaxis  Recurrent UTI The patient had recurrent urinary tract infection with multiple species of bacteria. On 09/18/2016, she  had Citrobacter that was resistant to Bactrim but sensitive to Cipro. On 09/27/2016, she had E. coli UTI that is resistant to both Bactrim and Cipro but sensitive to penicillin type antibiotic treatment. As of 09/29/2016, antibiotic therapy is switched to Augmentin. Urine culture and blood culture are negative so far  Altered mental status, stable Either due to progression of disease, infection or other causes Chest x-ray is unremarkable. Blood  culture and urine culture are negative MRI as above Haldol prn  Urinary and fecal incontinence, stable I have discontinued Foley 10/07/16 Abdominal x-ray show moderate stool burden.   She had good bowel movement yesterday Continue laxatives therapy  Weakness, fatigue Appreciate PT consult   Other constipation She had recent constipation. We discussed laxatives regimen.  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 Appreciate PT consult She will benefit from skilled nursing facility upon discharge  DVT prophylaxis Lovenox  Goals of care Full Code for now  Discharge planning Consulted social worker for SNF placement Will likely be here till next week   Heath Lark, MD 10/08/2016  7:14 AM

## 2016-10-09 NOTE — Progress Notes (Signed)
Subjective: The patient is seen and examined today. Her husband was at the bedside. She is feeling fine today with no specific complaints. She denied having any fever or chills. She has no nausea or vomiting. She denied having any chest pain, shortness of breath, cough or hemoptysis.  Objective: Vital signs in last 24 hours: Temp:  [97.7 F (36.5 C)-98.7 F (37.1 C)] 97.9 F (36.6 C) (03/11 0439) Pulse Rate:  [85-88] 85 (03/11 0439) Resp:  [16-20] 18 (03/11 0439) BP: (143-152)/(69-75) 144/69 (03/11 0439) SpO2:  [95 %-98 %] 95 % (03/11 0439)  Intake/Output from previous day: 03/10 0701 - 03/11 0700 In: 480 [P.O.:480] Out: -  Intake/Output this shift: Total I/O In: 240 [P.O.:240] Out: -   General appearance: alert, cooperative, fatigued and no distress Resp: clear to auscultation bilaterally Cardio: regular rate and rhythm, S1, S2 normal, no murmur, click, rub or gallop GI: soft, non-tender; bowel sounds normal; no masses,  no organomegaly Extremities: extremities normal, atraumatic, no cyanosis or edema  Lab Results:  No results for input(s): WBC, HGB, HCT, PLT in the last 72 hours. BMET No results for input(s): NA, K, CL, CO2, GLUCOSE, BUN, CREATININE, CALCIUM in the last 72 hours.  Studies/Results: No results found.  Medications: I have reviewed the patient's current medications.  Assessment/Plan: This is a very pleasant 71 years old white female with: 1) Glioblastoma multi-forme, status post a course of concurrent chemoradiation with recent stable disease.she is considered for treatment with maintenance Temodar but this was not started yet. She is currently on a tapered dose of Decadron an continue with this regimen. 2) recurrent urinary tract infection resistant to Bactrim and Cipro. She is currently on treatment with Augmentin. 3) weakness and fatigue: she will continue with PT. 4) disposition: she is expected to be discharged to skilled nursing facility early next  week.    LOS: 4 days    Kanyia Heaslip K. 10/09/2016

## 2016-10-09 NOTE — Progress Notes (Signed)
Nursing Note: Tele-sitter system in place for safety.Set up and explained to pt.wbb

## 2016-10-09 NOTE — Plan of Care (Signed)
Problem: Health Behavior/Discharge Planning: Goal: Ability to manage health-related needs will improve Outcome: Progressing Plan for d/c to SNF.

## 2016-10-09 NOTE — Clinical Social Work Note (Signed)
CSW presented bed offers to patient and gave her the list of SNFs to review.  Patient said she will look it over and discuss choices with her husband.  Weekday unit CSW to follow up with patient on her bed choices.  Jones Broom. [place], MSW, [place] 10/09/2016 4:32 PM

## 2016-10-10 ENCOUNTER — Inpatient Hospital Stay: Admit: 2016-10-10 | Payer: Self-pay | Admitting: Radiation Oncology

## 2016-10-10 DIAGNOSIS — C801 Malignant (primary) neoplasm, unspecified: Secondary | ICD-10-CM | POA: Diagnosis not present

## 2016-10-10 DIAGNOSIS — R531 Weakness: Secondary | ICD-10-CM | POA: Diagnosis not present

## 2016-10-10 DIAGNOSIS — C719 Malignant neoplasm of brain, unspecified: Secondary | ICD-10-CM | POA: Diagnosis not present

## 2016-10-10 DIAGNOSIS — W19XXXD Unspecified fall, subsequent encounter: Secondary | ICD-10-CM | POA: Diagnosis not present

## 2016-10-10 DIAGNOSIS — N390 Urinary tract infection, site not specified: Secondary | ICD-10-CM | POA: Diagnosis not present

## 2016-10-10 DIAGNOSIS — F419 Anxiety disorder, unspecified: Secondary | ICD-10-CM | POA: Diagnosis not present

## 2016-10-10 DIAGNOSIS — R4182 Altered mental status, unspecified: Secondary | ICD-10-CM | POA: Diagnosis not present

## 2016-10-10 DIAGNOSIS — K219 Gastro-esophageal reflux disease without esophagitis: Secondary | ICD-10-CM | POA: Diagnosis not present

## 2016-10-10 DIAGNOSIS — K5900 Constipation, unspecified: Secondary | ICD-10-CM | POA: Diagnosis not present

## 2016-10-10 DIAGNOSIS — Z7189 Other specified counseling: Secondary | ICD-10-CM | POA: Diagnosis not present

## 2016-10-10 DIAGNOSIS — C711 Malignant neoplasm of frontal lobe: Secondary | ICD-10-CM | POA: Diagnosis not present

## 2016-10-10 DIAGNOSIS — R278 Other lack of coordination: Secondary | ICD-10-CM | POA: Diagnosis not present

## 2016-10-10 DIAGNOSIS — R159 Full incontinence of feces: Secondary | ICD-10-CM | POA: Diagnosis not present

## 2016-10-10 DIAGNOSIS — R32 Unspecified urinary incontinence: Secondary | ICD-10-CM | POA: Diagnosis not present

## 2016-10-10 DIAGNOSIS — R41841 Cognitive communication deficit: Secondary | ICD-10-CM | POA: Diagnosis not present

## 2016-10-10 DIAGNOSIS — Z800 Family history of malignant neoplasm of digestive organs: Secondary | ICD-10-CM | POA: Diagnosis not present

## 2016-10-10 DIAGNOSIS — R2689 Other abnormalities of gait and mobility: Secondary | ICD-10-CM | POA: Diagnosis not present

## 2016-10-10 DIAGNOSIS — R569 Unspecified convulsions: Secondary | ICD-10-CM | POA: Diagnosis not present

## 2016-10-10 LAB — CULTURE, BLOOD (ROUTINE X 2)
Culture: NO GROWTH
Culture: NO GROWTH

## 2016-10-10 MED ORDER — DEXAMETHASONE 1 MG PO TABS: 1.0000 mg | ORAL_TABLET | Freq: Every day | ORAL | 1 refills | Status: AC

## 2016-10-10 MED ORDER — DEXAMETHASONE 0.5 MG PO TABS
1.0000 mg | ORAL_TABLET | Freq: Every day | ORAL | Status: DC
  Administered 2016-10-10: 1 mg via ORAL
  Filled 2016-10-10: qty 2

## 2016-10-10 MED ORDER — LEVETIRACETAM 500 MG PO TABS: 500.0000 mg | ORAL_TABLET | Freq: Two times a day (BID) | ORAL | 1 refills | Status: AC

## 2016-10-10 NOTE — Progress Notes (Signed)
Crystal Parsons   DOB:Jun 30, 1946   YQ#:[id number]    Subjective: She slept well last night.  Denies headache or new focal neurological deficits.  Objective:  Vitals:   10/09/16 2152 10/10/16 0623  BP: 140/86 (!) 141/69  Pulse: 83 73  Resp: 16 16  Temp: 97.8 F (36.6 C) 97.7 F (36.5 C)     Intake/Output Summary (Last 24 hours) at 10/10/16 0756 Last data filed at 10/09/16 1846  Gross per 24 hour  Intake              600 ml  Output                0 ml  Net              600 ml    GENERAL:alert, no distress and comfortable SKIN: skin color, texture, turgor are normal, no rashes or significant lesions EYES: normal, Conjunctiva are pink and non-injected, sclera clear Musculoskeletal:no cyanosis of digits and no clubbing  NEURO: alert & oriented x 3 with fluent speech, no focal motor/sensory deficits   Labs:  Lab Results  Component Value Date   WBC 6.9 10/05/2016   HGB 12.6 10/05/2016   HCT 37.8 10/05/2016   MCV 94.5 10/05/2016   PLT 191 10/05/2016   NEUTROABS 4.8 10/05/2016    Lab Results  Component Value Date   NA 141 10/05/2016   K 3.8 10/05/2016   CL 108 09/27/2016   CO2 20 (L) 10/05/2016    Assessment & Plan:   Glioblastoma multiforme of brain (Crystal Parsons) She has completed concurrent chemoradiation treatment. She had a recent CT imaging study done in the emergency department which showed no significant acute changes.  MRI brain show progression of one lesion but per discussion with radiation oncologist, it could be due to pseudo-progression. Her imaging study will be review at the next CNS oncology tumor board We reduced Keppra to 500 mg twice a day due to stable MRI findings She is not ready to begin maintenance Temodar I plan to continue dexamethasone taper to only 1 mg daily start today, to be further tapered next week We will resume Bactrim for PCP prophylaxis  Recurrent UTI The patient had recurrent urinary tract infection with multiple species of  bacteria. On 09/18/2016, she had Citrobacter that was resistant to Bactrim but sensitive to Cipro. On 09/27/2016, she had E. coli UTI that is resistant to both Bactrim and Cipro but sensitive to penicillin type antibiotic treatment. As of 09/29/2016, antibiotic therapy is switched to Augmentin. Urine culture and blood culture arenegative so far  Altered mental status, stable Either due to progression of disease, infection or other causes Chest x-ray is unremarkable. Blood culture and urine culture are negative MRI as above Haldol prn  Urinary and fecal incontinence, stable I have discontinued Foley 10/07/16 Abdominal x-ray show moderate stool burden. She had good bowel movement yesterday Continue laxatives therapy  Weakness, fatigue Appreciate PT consult   Other constipation She had recent constipation. We discussed laxatives regimen.  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 Appreciate PT consult She will benefit from skilled nursing facility upon discharge  DVT prophylaxis Lovenox  Goals of care Full Code for now  Discharge planning Consulted social worker for SNF placement Awaiting bed placement  Heath Lark, MD 10/10/2016  7:56 AM

## 2016-10-10 NOTE — Care Management Important Message (Signed)
Important Message  Patient Details  Name: Crystal Parsons MRN: [id number] Date of Birth: [birth year]   Medicare Important Message Given:  Yes    Kerin Salen 10/10/2016, 12:07 [place] Message  Patient Details  Name: Crystal Parsons MRN: [id number] Date of Birth: Jan 21, 1946   Medicare Important Message Given:  Yes    Kerin Salen 10/10/2016, 12:07 PM

## 2016-10-10 NOTE — Progress Notes (Addendum)
12:06 PM Transport to be set up at 2pm for [facility]. Packet completed  Report:  [phone] No other needs, DC to SNF today.    LCSW has made contact with husband who came to visit patient. Bed chosen: [facility]. Discussed case with Janie at [facility] and she has a bed available.  Husband to sign patient into facility today at 1:30pm. If patient medically stable, bed available today.  Will page MD and notify RN.  Patient can transport by EMS per husband wishes.  Will continue to assist with disposition and needs.  Lane Hacker, MSW Clinical Social Work: [employer] Coverage for :  [phone]

## 2016-10-10 NOTE — Progress Notes (Signed)
Report called to [facility] to [facility], that will receive the patient.  Patient transported via [facility] to facility.  Patient stable from AM assessment.

## 2016-10-10 NOTE — Discharge Summary (Signed)
Physician Discharge Summary  Patient ID: Crystal Parsons MRN: [id number] DOB/AGE: Nov 29, 1945 71 y.o.  Admit date: 10/05/2016 Discharge date: 10/10/2016  Primary Care Physician:  Irven Shelling, MD   Discharge Diagnoses:    Present on Admission: . Glioblastoma multiforme of brain ([facility]) . Acute lower UTI . Other constipation   Discharge Medications:  Allergies as of 10/10/2016      Reactions   Accupril [quinapril Hcl] Itching, Rash   *ACE INHIBITORS*    Latex Rash   Naproxen Itching, Rash   Vitamin E Hives   Zoloft [sertraline Hcl] Anxiety      Medication List    STOP taking these medications   amoxicillin-clavulanate 875-125 MG tablet Commonly known as:  AUGMENTIN   temozolomide 140 MG capsule Commonly known as:  TEMODAR     TAKE these medications   acetaminophen 500 MG tablet Commonly known as:  TYLENOL Take 1,000 mg by mouth every 6 (six) hours as needed for mild pain or moderate pain.   dexamethasone 1 MG tablet Commonly known as:  DECADRON Take 1 tablet (1 mg total) by mouth daily. What changed:  medication strength  how much to take  when to take this   famotidine 20 MG tablet Commonly known as:  PEPCID Take 1 tablet (20 mg total) by mouth 2 (two) times daily.   levETIRAcetam 500 MG tablet Commonly known as:  KEPPRA Take 1 tablet (500 mg total) by mouth 2 (two) times daily. What changed:  medication strength  how much to take   LORazepam 0.5 MG tablet Commonly known as:  ATIVAN Take 1 tablet (0.5 mg total) by mouth 2 (two) times daily as needed for anxiety.   multivitamin tablet Take 1 tablet by mouth daily with supper.   ondansetron 8 MG tablet Commonly known as:  ZOFRAN Take 1 tablet (8 mg total) by mouth every 8 (eight) hours as needed for nausea.   senna-docusate 8.6-50 MG tablet Commonly known as:  Senokot-S Take 2 tablets by mouth at bedtime.   sulfamethoxazole-trimethoprim 800-160 MG tablet Commonly known as:   BACTRIM DS,SEPTRA DS Take 1 tablet by mouth 3 (three) times a week.   Vitamin D 2000 units tablet Take 2,000 Units by mouth daily with supper.       Disposition and Follow-up:   Significant Diagnostic Studies:  Dg Chest 2 View  Result Date: 10/05/2016 CLINICAL DATA:  Recent completion of treatment for glioblastoma history of diastolic dysfunction EXAM: CHEST  2 VIEW COMPARISON:  07/07/2016 FINDINGS: Linear atelectasis at the lingula and bilateral lung bases. No acute consolidation or effusion. Normal heart size. No pneumothorax. IMPRESSION: Linear atelectasis at the lingula and bilateral lung bases. Otherwise negative examination Electronically Signed   By: Donavan Foil M.D.   On: 10/05/2016 22:25   Ct Head Wo Contrast  Result Date: 09/27/2016 CLINICAL DATA:  Recent confusion, history of glioblastoma over section EXAM: CT HEAD WITHOUT CONTRAST TECHNIQUE: Contiguous axial images were obtained from the base of the skull through the vertex without intravenous contrast. COMPARISON:  09/18/2016 FINDINGS: Brain: There again noted changes in the right frontal lobe consistent with the prior resection. The overall appearance is stable from the recent exam. No findings to suggest acute hemorrhage, acute infarction or space-occupying mass lesion are noted. Vascular: No hyperdense vessel or unexpected calcification. Skull: Postsurgical changes in the right frontal bone. Sinuses/Orbits: Mild mucosal thickening is noted within the sphenoid sinus. Other: None IMPRESSION: Postsurgical changes stable from the recent exam. Mild mucosal thickening  in the sphenoid sinus stable from the prior study. Electronically Signed   By: Inez Catalina M.D.   On: 09/27/2016 11:18   Ct Head Wo Contrast  Result Date: 09/18/2016 CLINICAL DATA:  Weakness. EXAM: CT HEAD WITHOUT CONTRAST TECHNIQUE: Contiguous axial images were obtained from the base of the skull through the vertex without intravenous contrast. COMPARISON:  06/30/2016  head CT.  Postoperative brain MRI 07/07/2016 FINDINGS: Brain: Expected appearance of resection cavity in the right frontal lobe with neutral mass effect. The periphery has calcification and mildly dense material correlating with blood products on previous MRI. More posteriorly is a hazy area of high density which may have progressed from prior CT, most consistent with cellular tumor in this patient with history of glioblastoma. No evidence of acute infarct, hemorrhage, or hydrocephalus. There is no midline shift or herniation. Moderate white matter disease. Vascular: Atherosclerosis.  No hyperdense vessel. Skull: Expected changes of right frontal craniotomy. Sinuses/Orbits: Minimal mucus in the left sphenoid sinus. IMPRESSION: 1. No acute finding. 2. History of glioblastoma with right frontal resection cavity and adjacent dense cellular tumor. No acute hemorrhage or significant mass effect. Electronically Signed   By: Monte Fantasia M.D.   On: 09/18/2016 11:09   Mr Jeri Cos ZO Contrast  Result Date: 10/06/2016 CLINICAL DATA:  GBM.  Neurologic decline. EXAM: MRI HEAD WITHOUT AND WITH CONTRAST TECHNIQUE: Multiplanar, multiecho pulse sequences of the brain and surrounding structures were obtained without and with intravenous contrast. CONTRAST:  57mL MULTIHANCE GADOBENATE DIMEGLUMINE 529 MG/ML IV SOLN COMPARISON:  07/07/2016 FINDINGS: Brain: Evolution/maturation of right frontal resection cavity containing blood products. Midline shift is resolved. No suspicious enhancement in the cavity wall. Enhancing mass in the right centrum semiovale has progressed, now 12 mm in diameter, previously 6 mm. Central necrotic features again seen. Progressively confluent FLAIR hyperintensity in the centrum semiovale and at the genu of the corpus callosum. Progress FLAIR hyperintensity asymmetrically about the right frontal resection cavity. No acute infarct, hemorrhage, or hydrocephalus. Vascular: Preserved flow voids Skull and  upper cervical spine: Negative Sinuses/Orbits: Negative IMPRESSION: 1. Mildly increased volume of enhancing tumor in the right centrum semiovale, now 12 mm. 2. Progressive bilateral cerebral white matter FLAIR hyperintensity, progressive nonenhancing tumor and/or treatment related changes. There is a background of extensive chronic microvascular disease. 3. Stable satisfactory appearance of the right frontal resection cavity Electronically Signed   By: Monte Fantasia M.D.   On: 10/06/2016 10:34   Dg Abd 2 Views  Result Date: 10/05/2016 CLINICAL DATA:  Overflow incontinence EXAM: ABDOMEN - 2 VIEW COMPARISON:  07/01/2016 FINDINGS: Atelectasis at the left lung base. No free air beneath the diaphragm. Nonobstructed bowel gas pattern with moderate stool. Ovoid soft tissue opacity in the pelvis likely reflects bladder. This does not appear significantly enlarged. Small calcified pelvic phleboliths. IMPRESSION: Nonobstructed bowel-gas pattern with moderate stool. Electronically Signed   By: Donavan Foil M.D.   On: 10/05/2016 22:23    Discharge Laboratory Values: Lab Results  Component Value Date   WBC 6.9 10/05/2016   HGB 12.6 10/05/2016   HCT 37.8 10/05/2016   MCV 94.5 10/05/2016   PLT 191 10/05/2016   Lab Results  Component Value Date   NA 141 10/05/2016   K 3.8 10/05/2016   CL 108 09/27/2016   CO2 20 (L) 10/05/2016    Brief H and P: For complete details please refer to admission H and P, but in brief, the patient was admitted due to progressive decline in neurological symptoms  and recent unresolved UTI  Glioblastoma multiforme of brain [facility]) She has completed concurrent chemoradiation treatment. She had a recent CT imaging study done in the emergency department which showed no significant acute changes.  MRI brain show progression of one lesion; will initiate further treatment once she has completed rehab We reduced Keppra to 500 mg twice a day due to stable MRI findings I plan to  continue dexamethasone taper to only 1 mg daily starting 10/10/16, to be further tapered in the outpatient setting We will resume Bactrim for PCP prophylaxis  Recurrent UTI The patient had recurrent urinary tract infection with multiple species of bacteria. On 09/18/2016, she had Citrobacter that was resistant to Bactrim but sensitive to Cipro. On 09/27/2016, she had E. coli UTI that is resistant to both Bactrim and Cipro but sensitive to penicillin type antibiotic treatment. As of 09/29/2016, antibiotic therapy is switched to Augmentin and she had completed treatment Urine culture and blood culture arenegative so far  Altered mental status, stable Either due to progression of disease, infection or other causes Chest x-ray is unremarkable. Blood culture and urine culture are negative MRI as above Haldol prn  Urinary and fecal incontinence, stable I have discontinuedFoley 10/07/16 Abdominal x-ray show moderate stool burden. She had good bowel movement daily Continue laxatives therapy  Weakness, fatigue Appreciate PT consult   Other constipation She had recent constipation. We discussed laxatives regimen.  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 Appreciate PT consult She will benefit from skilled nursing facility upon discharge   Physical Exam at Discharge: BP (!) 141/69 (BP Location: Right Arm)   Pulse 73   Temp 97.7 F (36.5 C) (Oral)   Resp 16   Ht 5\' 2"  (1.575 m)   Wt 200 lb (90.7 kg)   SpO2 99%   BMI 36.58 kg/m  GENERAL:alert, no distress and comfortable SKIN: skin color, texture, turgor are normal, no rashes or significant lesions EYES: normal, Conjunctiva are pink and non-injected, sclera clear OROPHARYNX:no exudate, no erythema and lips, buccal mucosa, and tongue normal  NECK: supple, thyroid normal size, non-tender, without nodularity LYMPH:  no palpable lymphadenopathy in the cervical, axillary or  inguinal LUNGS: clear to auscultation and percussion with normal breathing effort HEART: regular rate & rhythm and no murmurs and no lower extremity edema ABDOMEN:abdomen soft, non-tender and normal bowel sounds Musculoskeletal:no cyanosis of digits and no clubbing  NEURO: alert & oriented x 3 with fluent speech, no focal motor/sensory deficits  Hospital Course:  Active Problems:   Glioblastoma multiforme of brain ([facility])   Acute lower UTI   Other constipation   Recurrent falls   Seizure ([facility])   Incontinence of feces   Altered mental status   Diet:  Regular  Activity:  As tolerated  Condition at Discharge:   stable  Signed: Dr. Heath Lark [phone]  10/10/2016, 10:24 AM

## 2016-10-11 DIAGNOSIS — K219 Gastro-esophageal reflux disease without esophagitis: Secondary | ICD-10-CM | POA: Diagnosis not present

## 2016-10-11 DIAGNOSIS — C719 Malignant neoplasm of brain, unspecified: Secondary | ICD-10-CM | POA: Diagnosis not present

## 2016-10-11 DIAGNOSIS — N390 Urinary tract infection, site not specified: Secondary | ICD-10-CM | POA: Diagnosis not present

## 2016-10-11 DIAGNOSIS — W19XXXD Unspecified fall, subsequent encounter: Secondary | ICD-10-CM | POA: Diagnosis not present

## 2016-10-12 ENCOUNTER — Telehealth: Payer: Self-pay | Admitting: Hematology and Oncology

## 2016-10-12 DIAGNOSIS — R159 Full incontinence of feces: Secondary | ICD-10-CM | POA: Diagnosis not present

## 2016-10-12 DIAGNOSIS — R4182 Altered mental status, unspecified: Secondary | ICD-10-CM | POA: Diagnosis not present

## 2016-10-12 DIAGNOSIS — N390 Urinary tract infection, site not specified: Secondary | ICD-10-CM | POA: Diagnosis not present

## 2016-10-12 DIAGNOSIS — R32 Unspecified urinary incontinence: Secondary | ICD-10-CM | POA: Diagnosis not present

## 2016-10-12 DIAGNOSIS — C719 Malignant neoplasm of brain, unspecified: Secondary | ICD-10-CM | POA: Diagnosis not present

## 2016-10-12 NOTE — Telephone Encounter (Signed)
swpt husband to confirm added MD appt on 3/21 per [facility]Os

## 2016-10-14 DIAGNOSIS — W19XXXD Unspecified fall, subsequent encounter: Secondary | ICD-10-CM | POA: Diagnosis not present

## 2016-10-14 DIAGNOSIS — N390 Urinary tract infection, site not specified: Secondary | ICD-10-CM | POA: Diagnosis not present

## 2016-10-14 DIAGNOSIS — K219 Gastro-esophageal reflux disease without esophagitis: Secondary | ICD-10-CM | POA: Diagnosis not present

## 2016-10-14 DIAGNOSIS — C719 Malignant neoplasm of brain, unspecified: Secondary | ICD-10-CM | POA: Diagnosis not present

## 2016-10-18 ENCOUNTER — Ambulatory Visit: Admission: RE | Admit: 2016-10-18 | Payer: Medicare Other | Source: Ambulatory Visit | Admitting: Radiation Oncology

## 2016-10-18 ENCOUNTER — Ambulatory Visit
Admit: 2016-10-18 | Discharge: 2016-10-18 | Disposition: A | Discharge status: Home / Self Care | Payer: Medicare Other | Attending: Radiation Oncology | Admitting: Radiation Oncology

## 2016-10-19 ENCOUNTER — Other Ambulatory Visit: Payer: Medicare Other

## 2016-10-19 ENCOUNTER — Ambulatory Visit (HOSPITAL_BASED_OUTPATIENT_CLINIC_OR_DEPARTMENT_OTHER): Payer: Medicare Other | Admitting: Hematology and Oncology

## 2016-10-19 ENCOUNTER — Ambulatory Visit (HOSPITAL_BASED_OUTPATIENT_CLINIC_OR_DEPARTMENT_OTHER): Payer: Medicare Other | Admitting: Genetic Counselor

## 2016-10-19 ENCOUNTER — Encounter: Payer: Self-pay | Admitting: Genetic Counselor

## 2016-10-19 DIAGNOSIS — Z86010 Personal history of colonic polyps: Secondary | ICD-10-CM | POA: Insufficient documentation

## 2016-10-19 DIAGNOSIS — Z7189 Other specified counseling: Secondary | ICD-10-CM | POA: Diagnosis not present

## 2016-10-19 DIAGNOSIS — C719 Malignant neoplasm of brain, unspecified: Secondary | ICD-10-CM

## 2016-10-19 DIAGNOSIS — C711 Malignant neoplasm of frontal lobe: Secondary | ICD-10-CM

## 2016-10-19 DIAGNOSIS — Z7183 Encounter for nonprocreative genetic counseling: Secondary | ICD-10-CM

## 2016-10-19 DIAGNOSIS — R4182 Altered mental status, unspecified: Secondary | ICD-10-CM

## 2016-10-19 DIAGNOSIS — R404 Transient alteration of awareness: Secondary | ICD-10-CM

## 2016-10-19 DIAGNOSIS — Z800 Family history of malignant neoplasm of digestive organs: Secondary | ICD-10-CM | POA: Insufficient documentation

## 2016-10-19 NOTE — Progress Notes (Signed)
REFERRING PROVIDER: Lavone Orn, MD [address] [place], [place] [address]  PRIMARY PROVIDER:  Irven Shelling, MD  PRIMARY REASON FOR VISIT:  1. Glioblastoma multiforme of brain ([place])   2. History of colon polyps   3. Family history of colon cancer      HISTORY OF PRESENT ILLNESS:   Crystal Parsons, a 71 y.o. female, was seen for a [facility] cancer genetics consultation at the request of Dr. Laurann Montana due to a personal and family history of cancer.  Crystal Parsons presents to clinic today to discuss the possibility of a hereditary predisposition to cancer, genetic testing, and to further clarify her future cancer risks, as well as potential cancer risks for family members.   In 2017, at the age of 38, Crystal Parsons was diagnosed with Glioblastoma. This was treated with radiation.  The patient reports that she has had 25 colon polyps.     CANCER HISTORY:    Glioblastoma multiforme of brain [facility])   06/30/2016 - 07/10/2016 Hospital Admission    The patient was admitted to the hospital after presentation with seizure. She was subsequently found to have brain tumor and underwent primary resection. She was subsequently discharged to rehabilitation facility      07/01/2016 Imaging    CT head showed mass lesion within the right frontal lobe measuring up to 4.8 cm. 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 However, a cerebral abscess may have a similar appearance. MRI with and without contrast is recommended for further characterization. 2. No significant mass effect or midline shift.  No hydrocephalus.      07/01/2016 Imaging    Ct chest showed right parahilar density appears to be atelectasis or possible infiltrate but no mass is identified. There is also streaky bibasilar subsegmental atelectasis and very small pleural effusions. 2. No mediastinal or hilar mass or adenopathy. Scattered lymph nodes are noted.      07/01/2016 Imaging     MRI brain showed Motion degraded examination. 3 x 4.3 x 4.5 cm complex RIGHT frontal lobe mass with imaging characteristics of primary brain tumor. 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 Local edema versus nonenhancing infiltrative tumor results in 2 mm RIGHT to LEFT midline shift. No ventricular entrapment.      07/07/2016 Imaging    Interval RIGHT craniotomy for resection of dominant RIGHT frontal lobe tumor without convincing evidence of residual local disease. Intraventricular extension of blood products without hydrocephalus. 2 mm RIGHT to LEFT midline shift without ventricular entrapment. Residual subcentimeter RIGHT frontal lobe satellite nodule consistent with tumor.      07/07/2016 Surgery    Dr. Kathyrn Sheriff performed stereotactic right frontal craniotomy for resection of tumor       07/07/2016 Pathology Results    Accession: [id number] Brain, for tumor resection, Right Frontal Lobe - GLIOBLASTOMA MULTIFORME WHO GRADE IV/IV. - SEE ONCOLOGY TABLE BELOW. Microscopic Comment ONCOLOGY TABLE - BRAIN AND SPINAL CORD 1. Procedure: Resection 2. Tumor site, including laterality: Right frontal lobe 3. Maximum tumor size (cm): At least 4.8 cm (gross measurement) 4. Histologic type: Glioblastoma multiforme 5. Grade: WHO grade IV/IV 6. Margins (if applicable): Can not be assessed 7. Ancillary studies: Per protocol, a block will be sent for MGMT and IDH1/2 testing and MGMT was positive      08/02/2016 - 09/14/2016 Radiation Therapy    She started concurrent chemo/RT      08/02/2016 -  09/14/2016 Chemotherapy    She started concurrent chemo/RT      09/18/2016 Imaging    Ct head showed no acute finding. History of glioblastoma with right frontal resection cavity and adjacent dense cellular tumor. No acute hemorrhage or significant mass effect        HORMONAL RISK FACTORS:  Menarche was at age 27.  First live birth at age 28.  OCP  use for approximately <1 years.  Ovaries intact: no.  Hysterectomy: yes.  Menopausal status: postmenopausal.  HRT use: 0 years. Colonoscopy: yes; Reports a total of 25 colon polyps. Mammogram within the last year: yes. Number of breast biopsies: 2. Up to date with pelvic exams:  no. Any excessive radiation exposure in the past:  n/a  Past Medical History:  Diagnosis Date  . Anxiety   . Bradycardia    At times with pulse in the 40s  . Brain cancer ([place])    Glioblastoma  . Diarrhea    With blating, improved with gluten-free diet  . Diastolic dysfunction   . Essential hypertension, benign    Always has HTN when at the doctor's office.  . Family history of colon cancer   . Glioblastoma multiforme of brain ([place]) 07/11/2016  . Gout   . History of colon polyps   . Hyperlipidemia   . Lynch syndrome   . Mild aortic sclerosis ([place])   . Mitral valve problem    Mildly thickened mitral valve  . Mitral valve prolapse    Mild, anterior  . MR (mitral regurgitation)    ECHO 08/10/09 shows mild MR again and normal EF. No significant changes from prior ECHO.  Marland Kitchen Palpitations    Occasional, but are not significant. ECHO 08/20/08 - Normal EF (60%), mildly thickened mitral valve with mild anterior mitral valve prolapse, mild MR, mild aortic sclerosis, grade 1 diastolic dysfunction.  . Proteinuria    Likely secondary to Diabetes    Past Surgical History:  Procedure Laterality Date  . ABDOMINAL HYSTERECTOMY    . APPLICATION OF CRANIAL NAVIGATION Right 07/07/2016   Procedure: APPLICATION OF CRANIAL NAVIGATION;  Surgeon: Consuella Lose, MD;  Location: [facility];  Service: Neurosurgery;  Laterality: Right;  . BREAST LUMPECTOMY Left   . CRANIOTOMY Right 07/07/2016   Procedure: CRANIOTOMY TUMOR EXCISION w/BrainLab;  Surgeon: Consuella Lose, MD;  Location: [facility];  Service: Neurosurgery;  Laterality: Right;  . HEMORROIDECTOMY  2009  . TUBAL LIGATION      Social History   Social History  .  Marital status: Married    Spouse name: Pattiann Solanki  . Number of children: N/A  . Years of education: N/A   Social History Main Topics  . Smoking status: Never Smoker  . Smokeless tobacco: Never Used  . Alcohol use Yes     Comment: socially  . Drug use: No  . Sexual activity: Not on file   Other Topics Concern  . Not on file   Social History Narrative  . No narrative on file     FAMILY HISTORY:  We obtained a detailed, 4-generation family history.  Significant diagnoses are listed below: Family History  Problem Relation Age of Onset  . Cancer Mother     lung cancer and colon ca  . Colon cancer Mother   . Cancer Father   . Colon cancer Father 36  . Diabetes Father   . Heart attack Maternal Uncle   . Colon cancer Maternal Grandmother 36  . Dementia Maternal Grandfather   .  Heart attack Paternal Grandfather    The patient ha two daughters who are cancer free.  Her oldest daughter has had 4 colon polyps.  Her bother is cancer free and has not had colon polyps.  Both of her parents had colon cancer.  Her father was in his 66's and her mother was in her 65's.  Her mother had three brothers.  One brother died of SIDS, another died of pneumonia at age 41 and the third died of a heart attack at 43.  The patient's maternal grandmother had colon cancer in her 31's and her grandfather had dementia.  The patient's father was an only child.  There is no other reported family history of cancer. Crystal Parsons is unaware of previous family history of genetic testing for hereditary cancer risks. Patient's maternal ancestors are of [place] descent, and paternal ancestors are of [place] descent. There is no reported Ashkenazi Jewish ancestry. There is no known consanguinity.  GENETIC COUNSELING ASSESSMENT: Crystal Parsons is a 71 y.o. female with a personal history of glioblastoma and colon polyps and a family history of colon cancer which is somewhat suggestive of a hereditary cancer  syndrome such as Lynch syndrome and predisposition to cancer. We, therefore, discussed and recommended the following at today's visit.   DISCUSSION: We discussed that about 5-6% of colon cancer is due to a hereditary cause, most commonly Lynch syndrome.  Sometimes, families with Lynch syndrome have individuals with glioblastoma.  The patient reports being told that she has Lynch syndrome, but has never been tested.  She is currently being followed by colonoscopy on a Lynch syndrome protocol.  We reviewed the characteristics, features and inheritance patterns of hereditary cancer syndromes. We also discussed genetic testing, including the appropriate family members to test, the process of testing, insurance coverage and turn-around-time for results. We discussed the implications of a negative, positive and/or variant of uncertain significant result. We recommended Crystal Parsons pursue genetic testing for the hereditary common cancer gene panel. The Hereditary Gene Panel offered by [facility] includes sequencing and/or deletion duplication testing of the following 43 genes: APC, ATM, AXIN2, BARD1, BMPR1A, BRCA1, BRCA2, BRIP1, CDH1, CDKN2A (p14ARF), CDKN2A (p16INK4a), CHEK2, DICER1, EPCAM (Deletion/duplication testing only), GREM1 (promoter region deletion/duplication testing only), KIT, MEN1, MLH1, MSH2, MSH6, MUTYH, NBN, NF1, PALB2, PDGFRA, PMS2, POLD1, POLE, PTEN, RAD50, RAD51C, RAD51D, SDHB, SDHC, SDHD, SMAD4, SMARCA4. STK11, TP53, TSC1, TSC2, and VHL.  The following gene was evaluated for sequence changes only: SDHA and HOXB13 c.251G>A variant only.  Based on Crystal Parsons's personal and family history of cancer, she meets medical criteria for genetic testing. Despite that she meets criteria, she may still have an out of pocket cost. We discussed that if her out of pocket cost for testing is over $100, the laboratory will call and confirm whether she wants to proceed with testing.  If the out of pocket cost of  testing is less than $100 she will be billed by the genetic testing laboratory.   PLAN: Despite our recommendation, Crystal Parsons did not wish to pursue genetic testing at today's visit. We understand this decision, and remain available to coordinate genetic testing at any time in the future. We; therefore, recommend Crystal Parsons continue to follow the cancer screening guidelines given by her primary healthcare provider.  Lastly, we encouraged Crystal Parsons to remain in contact with cancer genetics annually so that we can continuously update the family history and inform her of any changes in cancer genetics and testing  that may be of benefit for this family.   Ms.  Parsons questions were answered to her satisfaction today. Our contact information was provided should additional questions or concerns arise. Thank you for the referral and allowing [place] to share in the care of your patient.   Natoria Archibald P. Florene Glen, [place], [facility] Certified Genetic Counselor [email or web address] phone: [phone]  The patient was seen for a total of 45 minutes in face-to-face genetic counseling.  This patient was discussed with Drs. Magrinat, Lindi Adie and/or Burr Medico who agrees with the above.    _______________________________________________________________________ For Office Staff:  Number of people involved in session: 2 Was an Intern/ student involved with case: no

## 2016-10-20 ENCOUNTER — Other Ambulatory Visit: Payer: Self-pay | Admitting: *Deleted

## 2016-10-20 ENCOUNTER — Encounter: Payer: Self-pay | Admitting: Hematology and Oncology

## 2016-10-20 DIAGNOSIS — Z7189 Other specified counseling: Secondary | ICD-10-CM | POA: Insufficient documentation

## 2016-10-20 NOTE — Assessment & Plan Note (Signed)
The patient is aware she has incurable disease and treatment is strictly palliative. We discussed poor prognosis with multifocal disease and incomplete resection She appears to have refractory disease We discussed importance of Advanced Directives and Living will. We discussed CODE STATUS; the patient desires to remain in full code. We discussed goals of care extensively. Ultimately, they are in agreement to continue rehabilitation efforts for now.

## 2016-10-20 NOTE — Progress Notes (Signed)
[facility] OFFICE PROGRESS NOTE  Patient Care Team: Lavone Orn, MD as PCP - General (Internal Medicine)  SUMMARY OF ONCOLOGIC HISTORY:   Glioblastoma multiforme of brain [facility])   06/30/2016 - 07/10/2016 Hospital Admission    The patient was admitted to the hospital after presentation with seizure. She was subsequently found to have brain tumor and underwent primary resection. She was subsequently discharged to rehabilitation facility      07/01/2016 Imaging    CT head showed mass lesion within the right frontal lobe measuring up to 4.8 cm. 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 However, a cerebral abscess may have a similar appearance. MRI with and without contrast is recommended for further characterization. 2. No significant mass effect or midline shift.  No hydrocephalus.      07/01/2016 Imaging    Ct chest showed right parahilar density appears to be atelectasis or possible infiltrate but no mass is identified. There is also streaky bibasilar subsegmental atelectasis and very small pleural effusions. 2. No mediastinal or hilar mass or adenopathy. Scattered lymph nodes are noted.      07/01/2016 Imaging    MRI brain showed Motion degraded examination. 3 x 4.3 x 4.5 cm complex RIGHT frontal lobe mass with imaging characteristics of primary brain tumor. 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 Local edema versus nonenhancing infiltrative tumor results in 2 mm RIGHT to LEFT midline shift. No ventricular entrapment.      07/07/2016 Imaging    Interval RIGHT craniotomy for resection of dominant RIGHT frontal lobe tumor without convincing evidence of residual local disease. Intraventricular extension of blood products without hydrocephalus. 2 mm RIGHT to LEFT midline shift without ventricular entrapment. Residual subcentimeter RIGHT frontal lobe satellite nodule consistent  with tumor.      07/07/2016 Surgery    Dr. Kathyrn Sheriff performed stereotactic right frontal craniotomy for resection of tumor       07/07/2016 Pathology Results    Accession: [id number] Brain, for tumor resection, Right Frontal Lobe - GLIOBLASTOMA MULTIFORME WHO GRADE IV/IV. - SEE ONCOLOGY TABLE BELOW. Microscopic Comment ONCOLOGY TABLE - BRAIN AND SPINAL CORD 1. Procedure: Resection 2. Tumor site, including laterality: Right frontal lobe 3. Maximum tumor size (cm): At least 4.8 cm (gross measurement) 4. Histologic type: Glioblastoma multiforme 5. Grade: WHO grade IV/IV 6. Margins (if applicable): Can not be assessed 7. Ancillary studies: Per protocol, a block will be sent for MGMT and IDH1/2 testing and MGMT was positive      08/02/2016 - 09/14/2016 Radiation Therapy    She started concurrent chemo/RT      08/02/2016 - 09/14/2016 Chemotherapy    She started concurrent chemo/RT      09/18/2016 Imaging    Ct head showed no acute finding. History of glioblastoma with right frontal resection cavity and adjacent dense cellular tumor. No acute hemorrhage or significant mass effect      10/05/2016 - 10/10/2016 Hospital Admission    She was admitted for altered mental status.  She was subsequently discharged to skilled rehabilitation facility       INTERVAL HISTORY: Please see below for problem oriented charting. The patient and her husband are seen together. Her husband wrote me a detailed note that I read quietly. He also shared with me an email communication with his brother who is a Tax adviser. The patient felt better while in skilled rehab facility.  She felt that she  is getting stronger. Her appetite is stable.  There were no reported seizures, headache or progressive neurological deficit. Her husband shared with me that she has intermittent good days and bad days. He has question related to whether neurologist involvement is necessary. They also question about palliative care, hospice  referral and prognosis. The patient has been found intermittently in bed personal hygiene status at the skilled facility She denies recent fever, chills, dysuria, cough or signs of infection  REVIEW OF SYSTEMS:   Constitutional: Denies fevers, chills or abnormal weight loss Eyes: Denies blurriness of vision Ears, nose, mouth, throat, and face: Denies mucositis or sore throat Respiratory: Denies cough, dyspnea or wheezes Cardiovascular: Denies palpitation, chest discomfort or lower extremity swelling Gastrointestinal:  Denies nausea, heartburn or change in bowel habits Skin: Denies abnormal skin rashes Lymphatics: Denies new lymphadenopathy or easy bruising Behavioral/Psych: Mood is stable, no new changes  All other systems were reviewed with the patient and are negative.  I have reviewed the past medical history, past surgical history, social history and family history with the patient and they are unchanged from previous note.  ALLERGIES:  is allergic to accupril [quinapril hcl]; latex; naproxen; vitamin e; and zoloft [sertraline hcl].  MEDICATIONS:  Current Outpatient Prescriptions  Medication Sig Dispense Refill  . acetaminophen (TYLENOL) 500 MG tablet Take 1,000 mg by mouth every 6 (six) hours as needed for mild pain or moderate pain.    . Cholecalciferol (VITAMIN D) 2000 UNITS tablet Take 2,000 Units by mouth daily with supper.     . dexamethasone (DECADRON) 1 MG tablet Take 1 tablet (1 mg total) by mouth daily. 30 tablet 1  . famotidine (PEPCID) 20 MG tablet Take 1 tablet (20 mg total) by mouth 2 (two) times daily. 60 tablet 11  . levETIRAcetam (KEPPRA) 500 MG tablet Take 1 tablet (500 mg total) by mouth 2 (two) times daily. 60 tablet 1  . LORazepam (ATIVAN) 0.5 MG tablet Take 1 tablet (0.5 mg total) by mouth 2 (two) times daily as needed for anxiety. 60 tablet 0  . Multiple Vitamin (MULTIVITAMIN) tablet Take 1 tablet by mouth daily with supper.     . ondansetron (ZOFRAN) 8 MG  tablet Take 1 tablet (8 mg total) by mouth every 8 (eight) hours as needed for nausea. 60 tablet 3  . senna-docusate (SENOKOT-S) 8.6-50 MG tablet Take 2 tablets by mouth at bedtime. 100 tablet 0  . sulfamethoxazole-trimethoprim (BACTRIM DS,SEPTRA DS) 800-160 MG tablet Take 1 tablet by mouth 3 (three) times a week. (Patient not taking: Reported on 10/05/2016) 60 tablet 11   No current facility-administered medications for this visit.     PHYSICAL EXAMINATION: ECOG PERFORMANCE STATUS: 1 - Symptomatic but completely ambulatory  Vitals:   10/19/16 1200  BP: 139/68  Pulse: (!) 104  Resp: (!) 21  Temp: 97.6 F (36.4 C)   Filed Weights   10/19/16 1200  Weight: 199 lb (90.3 kg)    GENERAL:alert, no distress and comfortable.  She appears cushingoid SKIN: skin color, texture, turgor are normal, no rashes or significant lesions EYES: normal, Conjunctiva are pink and non-injected, sclera clear Musculoskeletal:no cyanosis of digits and no clubbing  NEURO: alert & oriented x 3 with fluent speech, no focal motor/sensory deficits  LABORATORY DATA:  I have reviewed the data as listed    Component Value Date/Time   NA 141 10/05/2016 1438   K 3.8 10/05/2016 1438   CL 108 09/27/2016 1052   CO2 20 (L) 10/05/2016 1438  GLUCOSE 132 10/05/2016 1438   BUN 19.9 10/05/2016 1438   CREATININE 0.8 10/05/2016 1438   CALCIUM 9.0 10/05/2016 1438   PROT 6.1 (L) 10/05/2016 1438   ALBUMIN 3.5 10/05/2016 1438   AST 14 10/05/2016 1438   ALT 43 10/05/2016 1438   ALKPHOS 57 10/05/2016 1438   BILITOT 0.28 10/05/2016 1438   GFRNONAA >60 09/27/2016 1052   GFRAA >60 09/27/2016 1052    No results found for: SPEP, UPEP  Lab Results  Component Value Date   WBC 6.9 10/05/2016   NEUTROABS 4.8 10/05/2016   HGB 12.6 10/05/2016   HCT 37.8 10/05/2016   MCV 94.5 10/05/2016   PLT 191 10/05/2016      Chemistry      Component Value Date/Time   NA 141 10/05/2016 1438   K 3.8 10/05/2016 1438   CL 108  09/27/2016 1052   CO2 20 (L) 10/05/2016 1438   BUN 19.9 10/05/2016 1438   CREATININE 0.8 10/05/2016 1438      Component Value Date/Time   CALCIUM 9.0 10/05/2016 1438   ALKPHOS 57 10/05/2016 1438   AST 14 10/05/2016 1438   ALT 43 10/05/2016 1438   BILITOT 0.28 10/05/2016 1438       RADIOGRAPHIC STUDIES: I have personally reviewed the radiological images as listed and agreed with the findings in the report. Dg Chest 2 View  Result Date: 10/05/2016 CLINICAL DATA:  Recent completion of treatment for glioblastoma history of diastolic dysfunction EXAM: CHEST  2 VIEW COMPARISON:  07/07/2016 FINDINGS: Linear atelectasis at the lingula and bilateral lung bases. No acute consolidation or effusion. Normal heart size. No pneumothorax. IMPRESSION: Linear atelectasis at the lingula and bilateral lung bases. Otherwise negative examination Electronically Signed   By: Donavan Foil M.D.   On: 10/05/2016 22:25   Ct Head Wo Contrast  Result Date: 09/27/2016 CLINICAL DATA:  Recent confusion, history of glioblastoma over section EXAM: CT HEAD WITHOUT CONTRAST TECHNIQUE: Contiguous axial images were obtained from the base of the skull through the vertex without intravenous contrast. COMPARISON:  09/18/2016 FINDINGS: Brain: There again noted changes in the right frontal lobe consistent with the prior resection. The overall appearance is stable from the recent exam. No findings to suggest acute hemorrhage, acute infarction or space-occupying mass lesion are noted. Vascular: No hyperdense vessel or unexpected calcification. Skull: Postsurgical changes in the right frontal bone. Sinuses/Orbits: Mild mucosal thickening is noted within the sphenoid sinus. Other: None IMPRESSION: Postsurgical changes stable from the recent exam. Mild mucosal thickening in the sphenoid sinus stable from the prior study. Electronically Signed   By: Inez Catalina M.D.   On: 09/27/2016 11:18   Mr Jeri Cos HC Contrast  Result Date:  10/06/2016 CLINICAL DATA:  GBM.  Neurologic decline. EXAM: MRI HEAD WITHOUT AND WITH CONTRAST TECHNIQUE: Multiplanar, multiecho pulse sequences of the brain and surrounding structures were obtained without and with intravenous contrast. CONTRAST:  52m MULTIHANCE GADOBENATE DIMEGLUMINE 529 MG/ML IV SOLN COMPARISON:  07/07/2016 FINDINGS: Brain: Evolution/maturation of right frontal resection cavity containing blood products. Midline shift is resolved. No suspicious enhancement in the cavity wall. Enhancing mass in the right centrum semiovale has progressed, now 12 mm in diameter, previously 6 mm. Central necrotic features again seen. Progressively confluent FLAIR hyperintensity in the centrum semiovale and at the genu of the corpus callosum. Progress FLAIR hyperintensity asymmetrically about the right frontal resection cavity. No acute infarct, hemorrhage, or hydrocephalus. Vascular: Preserved flow voids Skull and upper cervical spine: Negative Sinuses/Orbits: Negative IMPRESSION:  1. Mildly increased volume of enhancing tumor in the right centrum semiovale, now 12 mm. 2. Progressive bilateral cerebral white matter FLAIR hyperintensity, progressive nonenhancing tumor and/or treatment related changes. There is a background of extensive chronic microvascular disease. 3. Stable satisfactory appearance of the right frontal resection cavity Electronically Signed   By: Monte Fantasia M.D.   On: 10/06/2016 10:34   Dg Abd 2 Views  Result Date: 10/05/2016 CLINICAL DATA:  Overflow incontinence EXAM: ABDOMEN - 2 VIEW COMPARISON:  07/01/2016 FINDINGS: Atelectasis at the left lung base. No free air beneath the diaphragm. Nonobstructed bowel gas pattern with moderate stool. Ovoid soft tissue opacity in the pelvis likely reflects bladder. This does not appear significantly enlarged. Small calcified pelvic phleboliths. IMPRESSION: Nonobstructed bowel-gas pattern with moderate stool. Electronically Signed   By: Donavan Foil M.D.    On: 10/05/2016 22:23    ASSESSMENT & PLAN:  Glioblastoma multiforme of brain [facility]) The patient and her husband are visibly upset because of interaction with family members and medical providers at the skilled facility. In the compassionate manner, I continue to address goals of care. We reviewed the imaging studies together, compared with prior imaging from December 2017.  I reviewed guidelines with the patient and her husband. I explained to them that at this point, given her significant neurological disabilities, and also the fact that she had recurrent urinary tract infections, she is not a candidate for aggressive chemotherapy. I recommend we focus our efforts with rehabilitation for the next 30 days or so. Her husband will contact me once she is discharged from the skilled facility so that we can resume discussion about treatment options and goals of care. They agree with the plan of care.  They agreed that at this point she is not ready to stop rehabilitation or enrollment to hospice  Altered mental status According to communication from her husband, the patient have good days and bad days at the skilled facility. She appears to be participating with physical therapy and rehabilitation. She appears to have poor memory, difficulties with self-care and personal hygiene, problems with occasional outbursts of emotion with family members and staff. I explained to the patient and her husband this is related to the brain injury from the tumor and from surgery. Unfortunately, there is no quick medication for this.  I recommend she continue skilled facility rehabilitation effort   Goals of care, counseling/discussion The patient is aware she has incurable disease and treatment is strictly palliative. We discussed poor prognosis with multifocal disease and incomplete resection She appears to have refractory disease We discussed importance of Advanced Directives and Living will. We discussed  CODE STATUS; the patient desires to remain in full code. We discussed goals of care extensively. Ultimately, they are in agreement to continue rehabilitation efforts for now.   No orders of the defined types were placed in this encounter.  All questions were answered. The patient knows to call the clinic with any problems, questions or concerns. No barriers to learning was detected. I spent 30 minutes counseling the patient face to face. The total time spent in the appointment was 55 minutes and more than 50% was on counseling and review of test results     Heath Lark, MD 10/20/2016 3:02 PM

## 2016-10-20 NOTE — Assessment & Plan Note (Signed)
According to communication from her husband, the patient have good days and bad days at the skilled facility. She appears to be participating with physical therapy and rehabilitation. She appears to have poor memory, difficulties with self-care and personal hygiene, problems with occasional outbursts of emotion with family members and staff. I explained to the patient and her husband this is related to the brain injury from the tumor and from surgery. Unfortunately, there is no quick medication for this.  I recommend she continue skilled facility rehabilitation effort

## 2016-10-20 NOTE — Patient Outreach (Signed)
Crystal Parsons [facility]) Care Management  10/20/2016  Crystal Parsons Jul 09, 1946 [id number]   Met with patient at facility, patient with extreme readmission risk score.   Patient reports she was doing well until a few months ago, she states she started to have seizures and they found a brain tumor. Patient is connected with Cancer center for her treatments She is at rehab to get her strength back in order to go home.  Patient reports she lives with her spouse who assists with her care. She denies any issues with medications, states her spouse manages medications. She states he transports her to appointments.  Patient states she has financial assistance for medications and medical costs, possibly through [facility], she was unsure.  RNCM reviewed [facility] care management program services. RNCM left brochure with patient to review   Spoke with SW briefly about patient plans,she states patient will go home with home care and spouse upon discharge, no date set as of yet.  Plan to follow up as indicated at next facility visit.  [name]. [name], RN, BSN, [place] [phone]) Business Cell  [phone]) Toll Free Office

## 2016-10-20 NOTE — Assessment & Plan Note (Signed)
The patient and her husband are visibly upset because of interaction with family members and medical providers at the skilled facility. In the compassionate manner, I continue to address goals of care. We reviewed the imaging studies together, compared with prior imaging from December 2017.  I reviewed guidelines with the patient and her husband. I explained to them that at this point, given her significant neurological disabilities, and also the fact that she had recurrent urinary tract infections, she is not a candidate for aggressive chemotherapy. I recommend we focus our efforts with rehabilitation for the next 30 days or so. Her husband will contact me once she is discharged from the skilled facility so that we can resume discussion about treatment options and goals of care. They agree with the plan of care.  They agreed that at this point she is not ready to stop rehabilitation or enrollment to hospice

## 2016-10-21 DIAGNOSIS — F419 Anxiety disorder, unspecified: Secondary | ICD-10-CM | POA: Diagnosis not present

## 2016-10-21 DIAGNOSIS — N390 Urinary tract infection, site not specified: Secondary | ICD-10-CM | POA: Diagnosis not present

## 2016-10-21 DIAGNOSIS — C719 Malignant neoplasm of brain, unspecified: Secondary | ICD-10-CM | POA: Diagnosis not present

## 2016-10-21 DIAGNOSIS — K219 Gastro-esophageal reflux disease without esophagitis: Secondary | ICD-10-CM | POA: Diagnosis not present

## 2016-10-24 ENCOUNTER — Encounter: Payer: Medicare Other | Admitting: Occupational Therapy

## 2016-10-25 ENCOUNTER — Encounter: Payer: Medicare Other | Admitting: Occupational Therapy

## 2016-10-25 ENCOUNTER — Encounter: Payer: Self-pay | Admitting: Occupational Therapy

## 2016-10-25 ENCOUNTER — Encounter: Payer: Medicare Other | Admitting: Speech Pathology

## 2016-10-25 DIAGNOSIS — R41844 Frontal lobe and executive function deficit: Secondary | ICD-10-CM

## 2016-10-25 NOTE — Therapy (Signed)
[facility] [address] [place], [place], [address] Phone: [phone]   Fax:  [phone]  Occupational Therapy Treatment  Patient Details  Name: Crystal Parsons MRN: [id number] Date of Birth: 1946-06-16 Referring Provider: Dr. Delice Lesch  Encounter Date: 10/25/2016    Past Medical History:  Diagnosis Date  . Anxiety   . Bradycardia    At times with pulse in the 40s  . Brain cancer ([place])    Glioblastoma  . Diarrhea    With blating, improved with gluten-free diet  . Diastolic dysfunction   . Essential hypertension, benign    Always has HTN when at the doctor's office.  . Family history of colon cancer   . Glioblastoma multiforme of brain ([place]) 07/11/2016  . Gout   . History of colon polyps   . Hyperlipidemia   . Lynch syndrome   . Mild aortic sclerosis ([place])   . Mitral valve problem    Mildly thickened mitral valve  . Mitral valve prolapse    Mild, anterior  . MR (mitral regurgitation)    ECHO 08/10/09 shows mild MR again and normal EF. No significant changes from prior ECHO.  Marland Kitchen Palpitations    Occasional, but are not significant. ECHO 08/20/08 - Normal EF (60%), mildly thickened mitral valve with mild anterior mitral valve prolapse, mild MR, mild aortic sclerosis, grade 1 diastolic dysfunction.  . Proteinuria    Likely secondary to Diabetes    Past Surgical History:  Procedure Laterality Date  . ABDOMINAL HYSTERECTOMY    . APPLICATION OF CRANIAL NAVIGATION Right 07/07/2016   Procedure: APPLICATION OF CRANIAL NAVIGATION;  Surgeon: Consuella Lose, MD;  Location: [facility];  Service: Neurosurgery;  Laterality: Right;  . BREAST LUMPECTOMY Left   . CRANIOTOMY Right 07/07/2016   Procedure: CRANIOTOMY TUMOR EXCISION w/BrainLab;  Surgeon: Consuella Lose, MD;  Location: [facility];  Service: Neurosurgery;  Laterality: Right;  . HEMORROIDECTOMY  2009  . TUBAL LIGATION      There were no vitals filed for this  visit.                              OT Short Term Goals - 10/25/16 0947      OT SHORT TERM GOAL #1   Title Pt and husband will be mod I with HEP - 08/30/2106   Time 4   Period Weeks   Status Achieved     OT SHORT TERM GOAL #2   Title Pt will decrease time on 9 hold peg with L hand by at least 4 seconds (baseline = 38.25) to assist with fine motor tasks   Time 4   Period Weeks   Status Achieved  30.41 secs      OT SHORT TERM GOAL #3   Title Pt will require supervision for simple, familiar hot meal prep   Time 4   Period Weeks   Status Achieved     OT SHORT TERM GOAL #4   Title Pt will demonstate ability to follow 2-3 step instruction within context of functional, familar task.    Time 4   Period Weeks   Status Achieved     OT SHORT TERM GOAL #5   Title Pt will identify at least 2 strategies for ST memory    Time 4   Period Weeks   Status Achieved  ST memory strategies discussed will reinforce     OT SHORT TERM  GOAL #6   Title Pt will require no more than 2 vc's for selective attention during functional familar tasks in a busy environment.    Time 4   Period Weeks   Status Achieved           OT Long Term Goals - 10/25/16 0947      OT LONG TERM GOAL #1   Title Pt and husband will be mod I with upgraded HEP prn - 09/27/2016   Time 8   Period Weeks   Status Achieved     OT LONG TERM GOAL #2   Title Pt will decrease time on 9 hole peg test with L hand by at least 8 seconds (baseline=38.25) to assist with functional tasks.   Time 8   Period Weeks   Status Achieved  26.25     OT LONG TERM GOAL #3   Title Pt will increase grip strength by 5 pounds to assist with functional use of L, non dominant hand (baseline= 30 pounds)   Time 8   Period Weeks   Status Achieved  35 lbs. - dominant hand 40     OT LONG TERM GOAL #4   Title Pt will be mod I with cooking 2 items simultaneously for altenating attention and safety with familiar hot meal  prep.    Time 8   Period Weeks   Status Not Met     OT LONG TERM GOAL #5   Title Pt will be mod I for laundry and simple home mgmt tasks.   Time 8   Period Weeks   Status Not Met     OT LONG TERM GOAL #6   Title Pt will be able to set up med pill box with supervision only   Time 8   Period Weeks   Status Deferred  per husband, MD making mulitple med changes and pt and husband have asked that this goal be deferred at ths time     OT [place] #7   Title Pt will be mod I to attend to L during functional tasks.    Time 8   Period Weeks   Status Achieved     OT LONG TERM GOAL #8   Title Pt will demonstrate supination of LUE WFL's to hold pills in L hand.    Status Not Met               Plan - 10/25/16 0947    Clinical Impression Statement Received phone call from husband who stated that pt had significantly deteriorated and has been admitted to [facility] as pt and family unable to care for pt at this time. Will d/c from OT at this time.   Plan d/c from OT      Patient will benefit from skilled therapeutic intervention in order to improve the following deficits and impairments:     Visit Diagnosis: No diagnosis found.    Problem List Patient Active Problem List   Diagnosis Date Noted  . Goals of care, counseling/discussion 10/20/2016  . History of colon polyps   . Family history of colon cancer   . Incontinence of feces 10/05/2016  . Altered mental status 10/05/2016  . Recurrent falls 09/22/2016  . Thrombocytopenia ([place]) 09/13/2016  . Depression, major, single episode, mild ([place]) 09/13/2016  . Skin rash 08/30/2016  . Elevated liver enzymes 08/23/2016  . Anxiety attack 08/16/2016  . Other constipation 08/09/2016  . Malignant frontal lobe tumor ([place])   .  Polyposis syndrome, familial 07/20/2016  . H/O: hysterectomy 07/20/2016  . Leukocytosis   . Monocytosis   . GBM (glioblastoma multiforme) ([place])   . Glioblastoma multiforme of brain ([place])  07/11/2016  . MR (mitral regurgitation)   . Brain tumor ([place])   . Apraxia   . Left-sided weakness   . Steroid-induced hyperglycemia   . Leucocytosis   . Benign essential HTN   . History of gout   . Dysuria   . Acute respiratory failure with hypoxia ([place])   . Brain mass   . Seizure ([place]) 06/30/2016  . Status epilepticus ([place]) 06/30/2016  OCCUPATIONAL THERAPY DISCHARGE SUMMARY  Visits from Start of Care: 11  Current functional level related to goals / functional outcomes: See above   Remaining deficits: Impaired cognition, decreased activity tolerance;  Since last visit per husband pt has significantly deteriorated and has been admitted to [facility] as pt and family no longer able to care for pt at home.    Education / Equipment: HEP Plan: Patient agrees to discharge.  Patient goals were partially met. Patient is being discharged due to meeting the stated rehab goals.  ?????       Quay Burow, OTR/L 10/25/2016, 9:49 AM  [facility] [address] [place], [place], [address] Phone: [phone]   Fax:  [phone]  Name: CRYSTINA BORRAYO MRN: [id number] Date of Birth: 13-Jul-1946

## 2016-10-26 DIAGNOSIS — R4182 Altered mental status, unspecified: Secondary | ICD-10-CM | POA: Diagnosis not present

## 2016-10-26 DIAGNOSIS — C719 Malignant neoplasm of brain, unspecified: Secondary | ICD-10-CM | POA: Diagnosis not present

## 2016-10-26 DIAGNOSIS — F419 Anxiety disorder, unspecified: Secondary | ICD-10-CM | POA: Diagnosis not present

## 2016-10-26 DIAGNOSIS — K219 Gastro-esophageal reflux disease without esophagitis: Secondary | ICD-10-CM | POA: Diagnosis not present

## 2016-10-27 NOTE — Progress Notes (Signed)
Pt discharged to [facility] on 10/10/2016. Medication reconciliation completed on 10/27/2016 by PharmD with [facility]. No issues noted.   Angela Burke, PharmD, BCPS Pharmacy Resident Pager: [phone]

## 2016-11-01 ENCOUNTER — Telehealth: Payer: Self-pay | Admitting: *Deleted

## 2016-11-01 ENCOUNTER — Encounter: Payer: Medicare Other | Admitting: Occupational Therapy

## 2016-11-01 NOTE — Telephone Encounter (Signed)
Called patient's daughter Michell Heinrich with Dr. Alvy Bimler orders.  "Please schedule the April 11th at 2:30.  I will notify my father.  After I check with him and there is a problem with his schedule I will call back."  Scheduling message sent.Marland Kitchen

## 2016-11-01 NOTE — Telephone Encounter (Signed)
I can them on 4/11 at 230 pm or 4/18 at 11 am Will need 45 minutes appt Please call and send scheduling msg

## 2016-11-01 NOTE — Telephone Encounter (Signed)
"  This is Crystal Parsons ([phone]) calling to schedule appointment with Dr. Alvy Bimler for the family only,  She told my father she can meet with [place] if needed.  Mom is in [place].  We'd like to meet without her because there is some confusion.  We have questions in regards to what's going on with her, the stay and care at the facility.  We have choices to make due to insurance.  Need to know her prognosis and what to do.  She can call me or my [place], [facility] number [phone]."

## 2016-11-02 ENCOUNTER — Telehealth: Payer: Self-pay | Admitting: Hematology and Oncology

## 2016-11-02 NOTE — Telephone Encounter (Signed)
Left message for dtr Romie Minus confirming appointments for 4/11 @ 2:30 pm.

## 2016-11-02 NOTE — Telephone Encounter (Signed)
I spoke with her husband, Jenny Reichmann at [phone] and discussed prognosis over the telephone Husband reported back to me that since I last saw the patient, she has significant speech problem, delusions where she appears to be "talking to dead people", constantly being found naked and incontinent. I recommend John to discuss possibility of transitioning her care to comfort measures and to get further information regarding residential hospice. I will talk to him again next week for further follow-up I will might cancel her appointment to see me next week

## 2016-11-04 ENCOUNTER — Encounter: Payer: Self-pay | Admitting: *Deleted

## 2016-11-04 ENCOUNTER — Other Ambulatory Visit: Payer: Medicare Other

## 2016-11-04 NOTE — Progress Notes (Signed)
Crystal Parsons decided to proceed with testing.  We ordered the 83 gene panel, rather than the 46 gene panel.  The Multi-Gene Panel offered by [facility] includes sequencing and/or deletion duplication testing of the following 83 genes: ALK, APC, ATM, AXIN2,BAP1,  BARD1, BLM, BMPR1A, BRCA1, BRCA2, BRIP1, CASR, CDC73, CDH1, CDK4, CDKN1B, CDKN1C, CDKN2A (p14ARF), CDKN2A (p16INK4a), CEBPA, CHEK2, CTNNA1, DICER1, DIS3L2, EGFR (c.2369C>T, p.Thr790Met variant only), EPCAM (Deletion/duplication testing only), FH, FLCN, GATA2, GPC3, GREM1 (Promoter region deletion/duplication testing only), HOXB13 (c.251G>A, p.Gly84Glu), HRAS, KIT, MAX, MEN1, MET, MITF (c.952G>A, p.Glu318Lys variant only), MLH1, MSH2, MSH3, MSH6, MUTYH, NBN, NF1, NF2, NTHL1, PALB2, PDGFRA, PHOX2B, PMS2, POLD1, POLE, POT1, PRKAR1A, PTCH1, PTEN, RAD50, RAD51C, RAD51D, RB1, RECQL4, RET, RUNX1, SDHAF2, SDHA (sequence changes only), SDHB, SDHC, SDHD, SMAD4, SMARCA4, SMARCB1, SMARCE1, STK11, SUFU, TERT, TERT, TMEM127, TP53, TSC1, TSC2, VHL, WRN and WT1.

## 2016-11-04 NOTE — Progress Notes (Signed)
[facility] Social [facility]  Clinical Social Work was referred by patient's family to review and complete healthcare advance directives.  Clinical Social Worker met with patient, spouse, and daughter in [place] office.  The patient designated spouse, Jenny Reichmann, as their primary healthcare agent and daughter, Sharyn Lull (goes by [place]), as their secondary agent.  Patient also completed healthcare living will.    Clinical Social Worker notarized documents and made copies for patient/family. Clinical Social Worker will send documents to medical records to be scanned into patient's chart. Clinical Social Worker encouraged patient/family to contact with any additional questions or concerns.  Maryjean Morn, MSW, LCSW Clinical Social Worker [facility] [phone]

## 2016-11-08 ENCOUNTER — Other Ambulatory Visit: Payer: Self-pay | Admitting: *Deleted

## 2016-11-08 ENCOUNTER — Encounter: Payer: Medicare Other | Admitting: Speech Pathology

## 2016-11-08 ENCOUNTER — Encounter: Payer: Medicare Other | Admitting: Occupational Therapy

## 2016-11-08 NOTE — Patient Outreach (Signed)
[facility] [place] [facility]) Care Management  11/08/2016  Crystal Parsons Dec 07, 1945 [id number]   Met with [name], SW at facility, she reports patient is being referred to Hospice care and will remain at facility, states she has had a rapid decline over the past week and spouse elects hospice care.  Plan to sign off  Mary E. [name], RN, BSN, [place] [phone]) Business Cell  [phone]) Toll Free Office

## 2016-11-09 ENCOUNTER — Ambulatory Visit: Payer: Medicare Other | Admitting: Hematology and Oncology

## 2016-11-09 ENCOUNTER — Encounter: Payer: Self-pay | Admitting: *Deleted

## 2016-11-09 ENCOUNTER — Telehealth: Payer: Self-pay | Admitting: Hematology and Oncology

## 2016-11-09 DIAGNOSIS — C719 Malignant neoplasm of brain, unspecified: Secondary | ICD-10-CM | POA: Diagnosis not present

## 2016-11-09 DIAGNOSIS — R531 Weakness: Secondary | ICD-10-CM | POA: Diagnosis not present

## 2016-11-09 NOTE — Telephone Encounter (Signed)
I spoke with the patient's husband, Jenny Reichmann. He gave me an update. After much discussion with  her primary care, they are all in agreement to enroll her to hospice care. I addressed all his questions

## 2016-11-10 ENCOUNTER — Encounter: Payer: Medicare Other | Admitting: Occupational Therapy

## 2016-11-10 ENCOUNTER — Encounter: Payer: Medicare Other | Admitting: Speech Pathology

## 2016-11-15 ENCOUNTER — Telehealth: Payer: Self-pay | Admitting: Genetic Counselor

## 2016-11-15 DIAGNOSIS — R531 Weakness: Secondary | ICD-10-CM | POA: Diagnosis not present

## 2016-11-15 DIAGNOSIS — Z1379 Encounter for other screening for genetic and chromosomal anomalies: Secondary | ICD-10-CM | POA: Insufficient documentation

## 2016-11-15 NOTE — Telephone Encounter (Signed)
LM on VM with good news.  Explained I was out of the office and would try back later this day or tomorrow.

## 2016-11-17 ENCOUNTER — Telehealth: Payer: Self-pay | Admitting: Genetic Counselor

## 2016-11-17 NOTE — Telephone Encounter (Signed)
Revealed negative genetic testing.  Discussed that we do not know why Crystal Parsons has a glioblastoma or why there is colon cancer in the family. It could be due to a different gene that we are not testing, or maybe our current technology may not be able to pick something up.  It will be important for her to keep in contact with genetics to keep up with whether additional testing may be needed.  Husband reported that patient is continuing to go downhill and is currently in palliative care.  He is happy that this is negative for his daughters and will provide them a copy of this.

## 2016-11-22 DIAGNOSIS — R531 Weakness: Secondary | ICD-10-CM | POA: Diagnosis not present

## 2016-11-23 ENCOUNTER — Ambulatory Visit: Payer: Self-pay | Admitting: Genetic Counselor

## 2016-11-23 DIAGNOSIS — Z860100 Personal history of colon polyps, unspecified: Secondary | ICD-10-CM

## 2016-11-23 DIAGNOSIS — Z1379 Encounter for other screening for genetic and chromosomal anomalies: Secondary | ICD-10-CM

## 2016-11-23 DIAGNOSIS — C719 Malignant neoplasm of brain, unspecified: Secondary | ICD-10-CM

## 2016-11-23 NOTE — Progress Notes (Signed)
HPI: Ms. Wesch was previously seen in the [place] clinic due to a personal and family history of cancer and concerns regarding a hereditary predisposition to cancer. Please refer to our prior cancer genetics clinic note for more information regarding Ms. Salsberry's medical, social and family histories, and our assessment and recommendations, at the time. Ms. Bisaillon's recent genetic test results were disclosed to her, as were recommendations warranted by these results. These results and recommendations are discussed in more detail below.  CANCER HISTORY:    Glioblastoma multiforme of brain [facility])   06/30/2016 - 07/10/2016 Hospital Admission    The patient was admitted to the hospital after presentation with seizure. She was subsequently found to have brain tumor and underwent primary resection. She was subsequently discharged to rehabilitation facility      07/01/2016 Imaging    CT head showed mass lesion within the right frontal lobe measuring up to 4.8 cm. 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 However, a cerebral abscess may have a similar appearance. MRI with and without contrast is recommended for further characterization. 2. No significant mass effect or midline shift.  No hydrocephalus.      07/01/2016 Imaging    Ct chest showed right parahilar density appears to be atelectasis or possible infiltrate but no mass is identified. There is also streaky bibasilar subsegmental atelectasis and very small pleural effusions. 2. No mediastinal or hilar mass or adenopathy. Scattered lymph nodes are noted.      07/01/2016 Imaging    MRI brain showed Motion degraded examination. 3 x 4.3 x 4.5 cm complex RIGHT frontal lobe mass with imaging characteristics of primary brain tumor. 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 Local edema versus nonenhancing infiltrative tumor results in  2 mm RIGHT to LEFT midline shift. No ventricular entrapment.      07/07/2016 Imaging    Interval RIGHT craniotomy for resection of dominant RIGHT frontal lobe tumor without convincing evidence of residual local disease. Intraventricular extension of blood products without hydrocephalus. 2 mm RIGHT to LEFT midline shift without ventricular entrapment. Residual subcentimeter RIGHT frontal lobe satellite nodule consistent with tumor.      07/07/2016 Surgery    Dr. Kathyrn Sheriff performed stereotactic right frontal craniotomy for resection of tumor       07/07/2016 Pathology Results    Accession: [id number] Brain, for tumor resection, Right Frontal Lobe - GLIOBLASTOMA MULTIFORME WHO GRADE IV/IV. - SEE ONCOLOGY TABLE BELOW. Microscopic Comment ONCOLOGY TABLE - BRAIN AND SPINAL CORD 1. Procedure: Resection 2. Tumor site, including laterality: Right frontal lobe 3. Maximum tumor size (cm): At least 4.8 cm (gross measurement) 4. Histologic type: Glioblastoma multiforme 5. Grade: WHO grade IV/IV 6. Margins (if applicable): Can not be assessed 7. Ancillary studies: Per protocol, a block will be sent for MGMT and IDH1/2 testing and MGMT was positive      08/02/2016 - 09/14/2016 Radiation Therapy    She started concurrent chemo/RT      08/02/2016 - 09/14/2016 Chemotherapy    She started concurrent chemo/RT      09/18/2016 Imaging    Ct head showed no acute finding. History of glioblastoma with right frontal resection cavity and adjacent dense cellular tumor. No acute hemorrhage or significant mass effect      10/05/2016 - 10/10/2016 Hospital Admission    She was admitted for altered mental status.  She was subsequently discharged to skilled rehabilitation  facility      11/14/2016 Genetic Testing    Negative genetic testing on the Multi-gene panel.  The Multi-Gene Panel offered by [facility] includes sequencing and/or deletion duplication testing of the following 80 genes: ALK, APC, ATM, AXIN2,BAP1,  BARD1,  BLM, BMPR1A, BRCA1, BRCA2, BRIP1, CASR, CDC73, CDH1, CDK4, CDKN1B, CDKN1C, CDKN2A (p14ARF), CDKN2A (p16INK4a), CEBPA, CHEK2, CTNNA1, DICER1, DIS3L2, EGFR (c.2369C>T, p.Thr790Met variant only), EPCAM (Deletion/duplication testing only), FH, FLCN, GATA2, GPC3, GREM1 (Promoter region deletion/duplication testing only), HOXB13 (c.251G>A, p.Gly84Glu), HRAS, KIT, MAX, MEN1, MET, MITF (c.952G>A, p.Glu318Lys variant only), MLH1, MSH2, MSH3, MSH6, MUTYH, NBN, NF1, NF2, NTHL1, PALB2, PDGFRA, PHOX2B, PMS2, POLD1, POLE, POT1, PRKAR1A, PTCH1, PTEN, RAD50, RAD51C, RAD51D, RB1, RECQL4, RET, RUNX1, SDHAF2, SDHA (sequence changes only), SDHB, SDHC, SDHD, SMAD4, SMARCA4, SMARCB1, SMARCE1, STK11, SUFU, TERT, TERT, TMEM127, TP53, TSC1, TSC2, VHL, WRN and WT1.         FAMILY HISTORY:  We obtained a detailed, 4-generation family history.  Significant diagnoses are listed below: Family History  Problem Relation Age of Onset  . Cancer Mother     lung cancer and colon ca  . Colon cancer Mother   . Cancer Father   . Colon cancer Father 37  . Diabetes Father   . Heart attack Maternal Uncle   . Colon cancer Maternal Grandmother 32  . Dementia Maternal Grandfather   . Heart attack Paternal Grandfather     The patient ha two daughters who are cancer free.  Her oldest daughter has had 4 colon polyps.  Her bother is cancer free and has not had colon polyps.  Both of her parents had colon cancer.  Her father was in his 27's and her mother was in her 59's.  Her mother had three brothers.  One brother died of SIDS, another died of pneumonia at age 47 and the third died of a heart attack at 68.  The patient's maternal grandmother had colon cancer in her 55's and her grandfather had dementia.  The patient's father was an only child.  There is no other reported family history of cancer. Ms. Audia is unaware of previous family history of genetic testing for hereditary cancer risks. Patient's maternal ancestors are of [place] and  [place] descent, and paternal ancestors are of [place] descent. There is no reported Ashkenazi Jewish ancestry. There is no known consanguinity.  GENETIC TEST RESULTS: Genetic testing reported out on November 14, 2016 through the [facility] cancer panel found no deleterious mutations.  The Multi-Gene Panel offered by [facility] includes sequencing and/or deletion duplication testing of the following 80 genes: ALK, APC, ATM, AXIN2,BAP1,  BARD1, BLM, BMPR1A, BRCA1, BRCA2, BRIP1, CASR, CDC73, CDH1, CDK4, CDKN1B, CDKN1C, CDKN2A (p14ARF), CDKN2A (p16INK4a), CEBPA, CHEK2, CTNNA1, DICER1, DIS3L2, EGFR (c.2369C>T, p.Thr790Met variant only), EPCAM (Deletion/duplication testing only), FH, FLCN, GATA2, GPC3, GREM1 (Promoter region deletion/duplication testing only), HOXB13 (c.251G>A, p.Gly84Glu), HRAS, KIT, MAX, MEN1, MET, MITF (c.952G>A, p.Glu318Lys variant only), MLH1, MSH2, MSH3, MSH6, MUTYH, NBN, NF1, NF2, NTHL1, PALB2, PDGFRA, PHOX2B, PMS2, POLD1, POLE, POT1, PRKAR1A, PTCH1, PTEN, RAD50, RAD51C, RAD51D, RB1, RECQL4, RET, RUNX1, SDHAF2, SDHA (sequence changes only), SDHB, SDHC, SDHD, SMAD4, SMARCA4, SMARCB1, SMARCE1, STK11, SUFU, TERT, TERT, TMEM127, TP53, TSC1, TSC2, VHL, WRN and WT1.  The test report has been scanned into EPIC and is located under the Molecular Pathology section of the Results Review tab.   We discussed with Ms. Bentz that since the current genetic testing is not perfect, it is possible there may be a gene mutation in one of these  genes that current testing cannot detect, but that chance is small. We also discussed, that it is possible that another gene that has not yet been discovered, or that we have not yet tested, is responsible for the cancer diagnoses in the family, and it is, therefore, important to remain in touch with cancer genetics in the future so that we can continue to offer Ms. Volpi the most up to date genetic testing.   CANCER SCREENING RECOMMENDATIONS: This result is reassuring  and indicates that Ms. Eccleston likely does not have an increased risk for a future cancer due to a mutation in one of these genes. This normal test also suggests that Ms. Gerken's cancer was most likely not due to an inherited predisposition associated with one of these genes.  Most cancers happen by chance and this negative test suggests that her cancer falls into this category.  We, therefore, recommended she continue to follow the cancer management and screening guidelines provided by her oncology and primary healthcare provider.   RECOMMENDATIONS FOR FAMILY MEMBERS: We recommended women in this family have a yearly mammogram beginning at age 25, or 58 years younger than the earliest onset of cancer, an annual clinical breast exam, and perform monthly breast self-exams. Women in this family should also have a gynecological exam as recommended by their primary provider. All family members should have a colonoscopy by age 93.  FOLLOW-UP: Lastly, we discussed with Ms. Crumrine that cancer genetics is a rapidly advancing field and it is possible that new genetic tests will be appropriate for her and/or her family members in the future. We encouraged her to remain in contact with cancer genetics on an annual basis so we can update her personal and family histories and let her know of advances in cancer genetics that may benefit this family.   Our contact number was provided. Ms. Pritts's questions were answered to her satisfaction, and she knows she is welcome to call us at anytime with additional questions or concerns.   Roma Kayser, MS, [facility] Certified Genetic Counselor [email or web address]

## 2016-11-24 ENCOUNTER — Encounter: Payer: Self-pay | Admitting: Hematology and Oncology

## 2016-11-24 DIAGNOSIS — W19XXXD Unspecified fall, subsequent encounter: Secondary | ICD-10-CM | POA: Diagnosis not present

## 2016-11-24 DIAGNOSIS — R4182 Altered mental status, unspecified: Secondary | ICD-10-CM | POA: Diagnosis not present

## 2016-11-24 DIAGNOSIS — C719 Malignant neoplasm of brain, unspecified: Secondary | ICD-10-CM | POA: Diagnosis not present

## 2016-11-24 DIAGNOSIS — K219 Gastro-esophageal reflux disease without esophagitis: Secondary | ICD-10-CM | POA: Diagnosis not present

## 2016-11-25 ENCOUNTER — Telehealth: Payer: Self-pay | Admitting: Hematology and Oncology

## 2016-11-25 NOTE — Telephone Encounter (Signed)
I spoke with the patient's husband. I inquired about the fall. It appears that the area is just extensively bruised without any open sores or signs of infection I expressed my support of transitioning her care to palliative care/hospice I addressed all his questions and concerns

## 2016-11-29 DIAGNOSIS — E785 Hyperlipidemia, unspecified: Secondary | ICD-10-CM | POA: Diagnosis not present

## 2016-11-29 DIAGNOSIS — I10 Essential (primary) hypertension: Secondary | ICD-10-CM | POA: Diagnosis not present

## 2016-11-29 DIAGNOSIS — C719 Malignant neoplasm of brain, unspecified: Secondary | ICD-10-CM | POA: Diagnosis not present

## 2016-11-29 DIAGNOSIS — R569 Unspecified convulsions: Secondary | ICD-10-CM | POA: Diagnosis not present

## 2016-11-29 DIAGNOSIS — K219 Gastro-esophageal reflux disease without esophagitis: Secondary | ICD-10-CM | POA: Diagnosis not present

## 2016-12-01 DIAGNOSIS — K219 Gastro-esophageal reflux disease without esophagitis: Secondary | ICD-10-CM | POA: Diagnosis not present

## 2016-12-01 DIAGNOSIS — I10 Essential (primary) hypertension: Secondary | ICD-10-CM | POA: Diagnosis not present

## 2016-12-01 DIAGNOSIS — R569 Unspecified convulsions: Secondary | ICD-10-CM | POA: Diagnosis not present

## 2016-12-01 DIAGNOSIS — C719 Malignant neoplasm of brain, unspecified: Secondary | ICD-10-CM | POA: Diagnosis not present

## 2016-12-01 DIAGNOSIS — E785 Hyperlipidemia, unspecified: Secondary | ICD-10-CM | POA: Diagnosis not present

## 2016-12-02 DIAGNOSIS — I10 Essential (primary) hypertension: Secondary | ICD-10-CM | POA: Diagnosis not present

## 2016-12-02 DIAGNOSIS — C719 Malignant neoplasm of brain, unspecified: Secondary | ICD-10-CM | POA: Diagnosis not present

## 2016-12-02 DIAGNOSIS — E785 Hyperlipidemia, unspecified: Secondary | ICD-10-CM | POA: Diagnosis not present

## 2016-12-02 DIAGNOSIS — K219 Gastro-esophageal reflux disease without esophagitis: Secondary | ICD-10-CM | POA: Diagnosis not present

## 2016-12-02 DIAGNOSIS — R569 Unspecified convulsions: Secondary | ICD-10-CM | POA: Diagnosis not present

## 2016-12-06 DIAGNOSIS — K219 Gastro-esophageal reflux disease without esophagitis: Secondary | ICD-10-CM | POA: Diagnosis not present

## 2016-12-06 DIAGNOSIS — E785 Hyperlipidemia, unspecified: Secondary | ICD-10-CM | POA: Diagnosis not present

## 2016-12-06 DIAGNOSIS — I10 Essential (primary) hypertension: Secondary | ICD-10-CM | POA: Diagnosis not present

## 2016-12-06 DIAGNOSIS — R569 Unspecified convulsions: Secondary | ICD-10-CM | POA: Diagnosis not present

## 2016-12-06 DIAGNOSIS — C719 Malignant neoplasm of brain, unspecified: Secondary | ICD-10-CM | POA: Diagnosis not present

## 2016-12-07 ENCOUNTER — Ambulatory Visit: Payer: Medicare Other | Admitting: Physical Medicine & Rehabilitation

## 2016-12-07 DIAGNOSIS — R296 Repeated falls: Secondary | ICD-10-CM | POA: Diagnosis not present

## 2016-12-07 DIAGNOSIS — Z66 Do not resuscitate: Secondary | ICD-10-CM | POA: Diagnosis not present

## 2016-12-07 DIAGNOSIS — C719 Malignant neoplasm of brain, unspecified: Secondary | ICD-10-CM | POA: Diagnosis not present

## 2016-12-08 DIAGNOSIS — R569 Unspecified convulsions: Secondary | ICD-10-CM | POA: Diagnosis not present

## 2016-12-08 DIAGNOSIS — C719 Malignant neoplasm of brain, unspecified: Secondary | ICD-10-CM | POA: Diagnosis not present

## 2016-12-08 DIAGNOSIS — I10 Essential (primary) hypertension: Secondary | ICD-10-CM | POA: Diagnosis not present

## 2016-12-08 DIAGNOSIS — K219 Gastro-esophageal reflux disease without esophagitis: Secondary | ICD-10-CM | POA: Diagnosis not present

## 2016-12-08 DIAGNOSIS — E785 Hyperlipidemia, unspecified: Secondary | ICD-10-CM | POA: Diagnosis not present

## 2016-12-09 DIAGNOSIS — F419 Anxiety disorder, unspecified: Secondary | ICD-10-CM | POA: Diagnosis not present

## 2016-12-09 DIAGNOSIS — K219 Gastro-esophageal reflux disease without esophagitis: Secondary | ICD-10-CM | POA: Diagnosis not present

## 2016-12-09 DIAGNOSIS — C719 Malignant neoplasm of brain, unspecified: Secondary | ICD-10-CM | POA: Diagnosis not present

## 2016-12-09 DIAGNOSIS — W19XXXD Unspecified fall, subsequent encounter: Secondary | ICD-10-CM | POA: Diagnosis not present

## 2016-12-15 DIAGNOSIS — K219 Gastro-esophageal reflux disease without esophagitis: Secondary | ICD-10-CM | POA: Diagnosis not present

## 2016-12-15 DIAGNOSIS — E785 Hyperlipidemia, unspecified: Secondary | ICD-10-CM | POA: Diagnosis not present

## 2016-12-15 DIAGNOSIS — I10 Essential (primary) hypertension: Secondary | ICD-10-CM | POA: Diagnosis not present

## 2016-12-15 DIAGNOSIS — C719 Malignant neoplasm of brain, unspecified: Secondary | ICD-10-CM | POA: Diagnosis not present

## 2016-12-15 DIAGNOSIS — R569 Unspecified convulsions: Secondary | ICD-10-CM | POA: Diagnosis not present

## 2016-12-23 DIAGNOSIS — I10 Essential (primary) hypertension: Secondary | ICD-10-CM | POA: Diagnosis not present

## 2016-12-23 DIAGNOSIS — K219 Gastro-esophageal reflux disease without esophagitis: Secondary | ICD-10-CM | POA: Diagnosis not present

## 2016-12-23 DIAGNOSIS — C719 Malignant neoplasm of brain, unspecified: Secondary | ICD-10-CM | POA: Diagnosis not present

## 2016-12-23 DIAGNOSIS — R569 Unspecified convulsions: Secondary | ICD-10-CM | POA: Diagnosis not present

## 2016-12-23 DIAGNOSIS — E785 Hyperlipidemia, unspecified: Secondary | ICD-10-CM | POA: Diagnosis not present

## 2016-12-24 ENCOUNTER — Encounter (HOSPITAL_COMMUNITY): Payer: Self-pay

## 2016-12-24 ENCOUNTER — Emergency Department (HOSPITAL_COMMUNITY)

## 2016-12-24 ENCOUNTER — Emergency Department (HOSPITAL_COMMUNITY)
Admission: EM | Admit: 2016-12-24 | Discharge: 2016-12-24 | Disposition: A | Discharge status: Home / Self Care | Attending: Emergency Medicine | Admitting: Emergency Medicine

## 2016-12-24 DIAGNOSIS — Y999 Unspecified external cause status: Secondary | ICD-10-CM | POA: Insufficient documentation

## 2016-12-24 DIAGNOSIS — G4489 Other headache syndrome: Secondary | ICD-10-CM | POA: Diagnosis not present

## 2016-12-24 DIAGNOSIS — Z85841 Personal history of malignant neoplasm of brain: Secondary | ICD-10-CM | POA: Insufficient documentation

## 2016-12-24 DIAGNOSIS — Z79899 Other long term (current) drug therapy: Secondary | ICD-10-CM | POA: Diagnosis not present

## 2016-12-24 DIAGNOSIS — S098XXA Other specified injuries of head, initial encounter: Secondary | ICD-10-CM | POA: Diagnosis not present

## 2016-12-24 DIAGNOSIS — W2201XA Walked into wall, initial encounter: Secondary | ICD-10-CM | POA: Insufficient documentation

## 2016-12-24 DIAGNOSIS — I10 Essential (primary) hypertension: Secondary | ICD-10-CM | POA: Insufficient documentation

## 2016-12-24 DIAGNOSIS — Y929 Unspecified place or not applicable: Secondary | ICD-10-CM | POA: Diagnosis not present

## 2016-12-24 DIAGNOSIS — Y9301 Activity, walking, marching and hiking: Secondary | ICD-10-CM | POA: Insufficient documentation

## 2016-12-24 DIAGNOSIS — Z91040 Latex allergy status: Secondary | ICD-10-CM | POA: Diagnosis not present

## 2016-12-24 DIAGNOSIS — N3000 Acute cystitis without hematuria: Secondary | ICD-10-CM | POA: Insufficient documentation

## 2016-12-24 DIAGNOSIS — S0990XA Unspecified injury of head, initial encounter: Secondary | ICD-10-CM | POA: Insufficient documentation

## 2016-12-24 DIAGNOSIS — R4182 Altered mental status, unspecified: Secondary | ICD-10-CM | POA: Diagnosis not present

## 2016-12-24 DIAGNOSIS — W19XXXA Unspecified fall, initial encounter: Secondary | ICD-10-CM

## 2016-12-24 DIAGNOSIS — R531 Weakness: Secondary | ICD-10-CM | POA: Diagnosis not present

## 2016-12-24 LAB — CBC WITH DIFFERENTIAL/PLATELET
BASOS ABS: 0 10*3/uL (ref 0.0–0.1)
Basophils Relative: 0 %
EOS ABS: 0 10*3/uL (ref 0.0–0.7)
EOS PCT: 0 %
HCT: 38.8 % (ref 36.0–46.0)
Hemoglobin: 13 g/dL (ref 12.0–15.0)
LYMPHS PCT: 12 %
Lymphs Abs: 1.1 10*3/uL (ref 0.7–4.0)
MCH: 30.7 pg (ref 26.0–34.0)
MCHC: 33.5 g/dL (ref 30.0–36.0)
MCV: 91.5 fL (ref 78.0–100.0)
MONO ABS: 0.5 10*3/uL (ref 0.1–1.0)
Monocytes Relative: 6 %
Neutro Abs: 6.8 10*3/uL (ref 1.7–7.7)
Neutrophils Relative %: 82 %
PLATELETS: 240 10*3/uL (ref 150–400)
RBC: 4.24 MIL/uL (ref 3.87–5.11)
RDW: 12.8 % (ref 11.5–15.5)
WBC: 8.4 10*3/uL (ref 4.0–10.5)

## 2016-12-24 LAB — BASIC METABOLIC PANEL
ANION GAP: 10 (ref 5–15)
BUN: 11 mg/dL (ref 6–20)
CALCIUM: 9.6 mg/dL (ref 8.9–10.3)
CO2: 21 mmol/L — ABNORMAL LOW (ref 22–32)
Chloride: 110 mmol/L (ref 101–111)
Creatinine, Ser: 0.84 mg/dL (ref 0.44–1.00)
GFR calc Af Amer: >60 mL/min (ref >60)
Glucose, Bld: 131 mg/dL — ABNORMAL HIGH (ref 65–99)
POTASSIUM: 3.8 mmol/L (ref 3.5–5.1)
SODIUM: 141 mmol/L (ref 135–145)

## 2016-12-24 LAB — URINALYSIS, ROUTINE W REFLEX MICROSCOPIC
Bilirubin Urine: NEGATIVE
GLUCOSE, UA: NEGATIVE mg/dL
Ketones, ur: NEGATIVE mg/dL
Nitrite: POSITIVE — AB
PH: 6 (ref 5.0–8.0)
Protein, ur: NEGATIVE mg/dL
SPECIFIC GRAVITY, URINE: 1.004 — AB (ref 1.005–1.030)

## 2016-12-24 MED ORDER — CEPHALEXIN 500 MG PO CAPS
500.0000 mg | ORAL_CAPSULE | Freq: Three times a day (TID) | ORAL | 0 refills | Status: AC
Start: 2016-12-24 — End: 2016-12-29

## 2016-12-24 NOTE — ED Provider Notes (Signed)
[facility] DEPT Provider Note   CSN: [id number] Arrival date & time: 12/24/16  1247     History   Chief Complaint Chief Complaint  Patient presents with  . Fall    HPI Crystal Parsons is a 71 y.o. female.  The history is provided by the patient and medical records.   71 year old female with history of anxiety, bradycardia, history of glioblastoma status post attempted resection in December 2017, hypertension, hyperlipidemia, Lynch syndrome, presenting to the ED after a fall. Per family, patient has had quite a few falls over the past 3 weeks or so.  Patient lives at [facility]. Apparently she was walking today holding onto the handrail when she lost her footing and fell. She did hit the left side of her head against the wall. There was no loss of consciousness.  Patient presents to the ED with large hematoma to the left side of her head and some bruising across her nose. Family reports she has had a rather rapid decline over the past few months which they've discussed with her physicians. They are aware that there is no treatment for this and she is currently undergoing hospice care. They states she's been neglecting her left side more and more since her surgery and has also been having some increasingly slurred speech. Patient states she does have some episodes of confusion, that has seemed to get somewhat better recently.  She is not currently on any type of anticoagulation.  States she did have an MRI in March 2018, but has not had any other imaging since this time. No recent illness, fevers, or chills. Family does report history of UTI the patient denies any current urinary symptoms. No abdominal pain, nausea, vomiting. Has been eating and drinking fairly well.  Past Medical History:  Diagnosis Date  . Anxiety   . Bradycardia    At times with pulse in the 40s  . Brain cancer ([place])    Glioblastoma  . Diarrhea    With blating, improved with gluten-free diet  .  Diastolic dysfunction   . Essential hypertension, benign    Always has HTN when at the doctor's office.  . Family history of colon cancer   . Glioblastoma multiforme of brain ([place]) 07/11/2016  . Gout   . History of colon polyps   . Hyperlipidemia   . Lynch syndrome   . Mild aortic sclerosis ([place])   . Mitral valve problem    Mildly thickened mitral valve  . Mitral valve prolapse    Mild, anterior  . MR (mitral regurgitation)    ECHO 08/10/09 shows mild MR again and normal EF. No significant changes from prior ECHO.  Marland Kitchen Palpitations    Occasional, but are not significant. ECHO 08/20/08 - Normal EF (60%), mildly thickened mitral valve with mild anterior mitral valve prolapse, mild MR, mild aortic sclerosis, grade 1 diastolic dysfunction.  . Proteinuria    Likely secondary to Diabetes    Patient Active Problem List   Diagnosis Date Noted  . Genetic testing 11/15/2016  . Goals of care, counseling/discussion 10/20/2016  . History of colon polyps   . Family history of colon cancer   . Incontinence of feces 10/05/2016  . Altered mental status 10/05/2016  . Recurrent falls 09/22/2016  . Thrombocytopenia ([place]) 09/13/2016  . Depression, major, single episode, mild ([place]) 09/13/2016  . Skin rash 08/30/2016  . Elevated liver enzymes 08/23/2016  . Anxiety attack 08/16/2016  . Other constipation 08/09/2016  . 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 Polyposis syndrome, familial 07/20/2016  . H/O: hysterectomy 07/20/2016  . Leukocytosis   . Monocytosis   . GBM (glioblastoma multiforme) ([place])   . Glioblastoma multiforme of brain ([place]) 07/11/2016  . MR (mitral regurgitation)   . Brain tumor ([place])   . Apraxia   . Left-sided weakness   . Steroid-induced hyperglycemia   . Leucocytosis   . Benign essential HTN   . History of gout   . Dysuria   . Acute respiratory failure with hypoxia ([place])   . Brain mass   . Seizure ([place]) 06/30/2016  . Status epilepticus ([place]) 06/30/2016    Past  Surgical History:  Procedure Laterality Date  . ABDOMINAL HYSTERECTOMY    . APPLICATION OF CRANIAL NAVIGATION Right 07/07/2016   Procedure: APPLICATION OF CRANIAL NAVIGATION;  Surgeon: Consuella Lose, MD;  Location: [facility];  Service: Neurosurgery;  Laterality: Right;  . BREAST LUMPECTOMY Left   . CRANIOTOMY Right 07/07/2016   Procedure: CRANIOTOMY TUMOR EXCISION w/BrainLab;  Surgeon: Consuella Lose, MD;  Location: [facility];  Service: Neurosurgery;  Laterality: Right;  . HEMORROIDECTOMY  2009  . TUBAL LIGATION      OB History    No data available       Home Medications    Prior to Admission medications   Medication Sig Start Date End Date Taking? Authorizing Provider  acetaminophen (TYLENOL) 500 MG tablet Take 1,000 mg by mouth every 6 (six) hours as needed for mild pain or moderate pain.    [provider]  Cholecalciferol (VITAMIN D) 2000 UNITS tablet Take 2,000 Units by mouth daily with supper.     [provider]  dexamethasone (DECADRON) 1 MG tablet Take 1 tablet (1 mg total) by mouth daily. 10/10/16   Heath Lark, MD  famotidine (PEPCID) 20 MG tablet Take 1 tablet (20 mg total) by mouth 2 (two) times daily. 08/16/16   Heath Lark, MD  levETIRAcetam (KEPPRA) 500 MG tablet Take 1 tablet (500 mg total) by mouth 2 (two) times daily. 10/10/16   Heath Lark, MD  LORazepam (ATIVAN) 0.5 MG tablet Take 1 tablet (0.5 mg total) by mouth 2 (two) times daily as needed for anxiety. 08/16/16   Heath Lark, MD  Multiple Vitamin (MULTIVITAMIN) tablet Take 1 tablet by mouth daily with supper.     [provider]  ondansetron (ZOFRAN) 8 MG tablet Take 1 tablet (8 mg total) by mouth every 8 (eight) hours as needed for nausea. 07/22/16   Heath Lark, MD  senna-docusate (SENOKOT-S) 8.6-50 MG tablet Take 2 tablets by mouth at bedtime. 07/21/16   Love, Ivan Anchors, PA-C  sulfamethoxazole-trimethoprim (BACTRIM DS,SEPTRA DS) 800-160 MG tablet Take 1 tablet by mouth 3 (three) times a  week. Patient not taking: Reported on 10/05/2016 08/17/16   Heath Lark, MD    Family History Family History  Problem Relation Age of Onset  . Cancer Mother        lung cancer and colon ca  . Colon cancer Mother   . Cancer Father   . Colon cancer Father 50  . Diabetes Father   . Heart attack Maternal Uncle   . Colon cancer Maternal Grandmother 73  . Dementia Maternal Grandfather   . Heart attack Paternal Grandfather     Social History Social History  Substance Use Topics  . Smoking status: Never Smoker  . Smokeless tobacco: Never Used  . Alcohol use Yes     Comment: socially     Allergies  Accupril [quinapril hcl]; Latex; Naproxen; Vitamin e; and Zoloft [sertraline hcl]   Review of Systems Review of Systems  Skin: Positive for wound.  All other systems reviewed and are negative.    Physical Exam Updated Vital Signs BP 130/68   Pulse 84   Temp 97.6 F (36.4 C) (Oral)   Resp 15   Ht 5' 2.5" (1.588 m)   Wt 79.4 kg (175 lb)   SpO2 98%   BMI 31.50 kg/m   Physical Exam  Constitutional: She is oriented to person, place, and time. She appears well-developed and well-nourished.  HENT:  Head: Normocephalic and atraumatic.  Mouth/Throat: Oropharynx is clear and moist.  Large hematoma to left forehead just above the eyebrow, there is an abrasion across the bridge of nose with some old appearing bruising; no gross deformities Craniotomy scar noted to scalp  Eyes: Conjunctivae and EOM are normal. Pupils are equal, round, and reactive to light.  Mild tenderness of left inferior orbital rim; no gross deformities; pupils symmetric and reactive bilaterally  Neck: Normal range of motion.  Cardiovascular: Normal rate, regular rhythm and normal heart sounds.   Pulmonary/Chest: Effort normal and breath sounds normal. No respiratory distress. She has no wheezes.  Abdominal: Soft. Bowel sounds are normal.  Musculoskeletal: Normal range of motion.  Neurological: She is alert  and oriented to person, place, and time.  AAOx3, able to move arms and legs when prompted, speech seems mostly clear, some intermittent slurring which is not new  Skin: Skin is warm and dry.  Psychiatric: She has a normal mood and affect.  Nursing note and vitals reviewed.    ED Treatments / Results  Labs (all labs ordered are listed, but only abnormal results are displayed) Labs Reviewed  BASIC METABOLIC PANEL - Abnormal; Notable for the following:       Result Value   CO2 21 (*)    Glucose, Bld 131 (*)    All other components within normal limits  CBC WITH DIFFERENTIAL/PLATELET  URINALYSIS, ROUTINE W REFLEX MICROSCOPIC    EKG  EKG Interpretation None       Radiology No results found.  Procedures Procedures (including critical care time)  Medications Ordered in ED Medications - No data to display   Initial Impression / Assessment and Plan / ED Course  I have reviewed the triage vital signs and the nursing notes.  Pertinent labs & imaging results that were available during my care of the patient were reviewed by me and considered in my medical decision making (see chart for details).  71 year old female here after a fall. She has a known glioblastoma, currently on hospice for this. Her family has had several falls over the past 2 weeks. She is awake, alert, oriented here. She is interactive during exam, able to answer questions and follow commands when prompted. She does have a large hematoma to the left forehead just above the eyebrow as well as an abrasion and bruising across the bridge of the nose. Some of this appears old. She is not currently on anticoagulation.  I note and discussion with family regarding patient currently being on hospice and she is not having any active treatment for her tumor. I discussed that if we obtain head CT and there are acute changes or genetic injuries, likely nothing will be done. Patient's family acknowledged this, they remain adamant  that they do not want any surgical intervention at this time, however they would like to know if there are things that could  make her rapidly decompensate. She does remain a full code at this time, husband is not ready to make her a DNR yet.  Will proceed with head and face CT as well as basic labs, UA.  4:23 PM CT's and UA pending.  Care signed out to oncoming team for review and disposition.  If no acute findings, feel patient can be discharged back to her SNF to continue hospice care.  Case discussed with attending physician, Dr. Jeanell Sparrow, who evaluated patient and agrees with assessment and plan of care.  Final Clinical Impressions(s) / ED Diagnoses   Final diagnoses:  Fall, initial encounter  Injury of head, initial encounter    New Prescriptions New Prescriptions   No medications on file     Larene Pickett, PA-C 12/24/16 1629    Pattricia Boss, MD 12/25/16 [phone]

## 2016-12-24 NOTE — ED Notes (Signed)
Pt transported to CT ?

## 2016-12-24 NOTE — ED Notes (Signed)
Hospice called and given updated by this RN

## 2016-12-24 NOTE — ED Notes (Signed)
Lisa PA in room with pt.

## 2016-12-24 NOTE — Progress Notes (Signed)
1605: [facility] Liaison RN Visit  Met with patient and family in ED after CT completed. Results pending. Offered support and comfort. Contact information provided to family.  Will continue to follow.  Thank you, Margaretmary Eddy, [facility] Liaison [phone]

## 2016-12-24 NOTE — ED Notes (Signed)
Pt verbalized understanding of d/c instructions and has no further questions. Pt to be d/c back to [facility] via [facility].

## 2016-12-24 NOTE — ED Provider Notes (Signed)
Received signout from Quincy Carnes, PA-C. Please see her history and physical exam for further details. Briefly, patient has a history of GBM. Hospice care. Has had multiple falls recently, including one today. Family reports that they do not necessarily want an acute intervention if anything is found, but would like to know if the patient is having any other issues. CT of the head ordered. Pending that. Labs so far reassuring. Urinalysis still pending. Patient currently at her baseline mental status. What ever is found on remainder of workup, patient okay to be discharged back to her facility.  Urinalysis indicative of urinary tract infection. We'll treat appropriately. CT head does not show any evidence of acute bleed. Does show progression of her GBM. Updated the family. Told him to follow up with her primary care doctor for repeat urinalysis. We'll send the patient back to her facility.   Maryan Puls, MD 12/24/16 [id number]    Lajean Saver, MD 12/24/16 2217

## 2016-12-24 NOTE — ED Notes (Signed)
Pt assisted to bedside commode, given water to drink, pt unable to urinate at this time.

## 2016-12-24 NOTE — ED Triage Notes (Signed)
Per EMS, pt from [facility], pt was standing and lost her balance and hit her head against the wall. Pt has hematoma above left eye. Pt denied dizziness or loc. Pt has chronic left side weakness due to malignant brain tumor that was operated on in December. Pt alert and oriented x 4. Family also reports that pt has intermittent slurred speech. VSS, CBG normal.

## 2016-12-27 DIAGNOSIS — K219 Gastro-esophageal reflux disease without esophagitis: Secondary | ICD-10-CM | POA: Diagnosis not present

## 2016-12-27 DIAGNOSIS — C719 Malignant neoplasm of brain, unspecified: Secondary | ICD-10-CM | POA: Diagnosis not present

## 2016-12-27 DIAGNOSIS — I10 Essential (primary) hypertension: Secondary | ICD-10-CM | POA: Diagnosis not present

## 2016-12-27 DIAGNOSIS — W19XXXD Unspecified fall, subsequent encounter: Secondary | ICD-10-CM | POA: Diagnosis not present

## 2016-12-27 DIAGNOSIS — E785 Hyperlipidemia, unspecified: Secondary | ICD-10-CM | POA: Diagnosis not present

## 2016-12-27 DIAGNOSIS — N390 Urinary tract infection, site not specified: Secondary | ICD-10-CM | POA: Diagnosis not present

## 2016-12-27 DIAGNOSIS — R569 Unspecified convulsions: Secondary | ICD-10-CM | POA: Diagnosis not present

## 2016-12-28 DIAGNOSIS — C719 Malignant neoplasm of brain, unspecified: Secondary | ICD-10-CM | POA: Diagnosis not present

## 2016-12-28 DIAGNOSIS — E785 Hyperlipidemia, unspecified: Secondary | ICD-10-CM | POA: Diagnosis not present

## 2016-12-28 DIAGNOSIS — I10 Essential (primary) hypertension: Secondary | ICD-10-CM | POA: Diagnosis not present

## 2016-12-28 DIAGNOSIS — R569 Unspecified convulsions: Secondary | ICD-10-CM | POA: Diagnosis not present

## 2016-12-28 DIAGNOSIS — K219 Gastro-esophageal reflux disease without esophagitis: Secondary | ICD-10-CM | POA: Diagnosis not present

## 2016-12-29 DIAGNOSIS — C719 Malignant neoplasm of brain, unspecified: Secondary | ICD-10-CM | POA: Diagnosis not present

## 2016-12-29 DIAGNOSIS — R569 Unspecified convulsions: Secondary | ICD-10-CM | POA: Diagnosis not present

## 2016-12-29 DIAGNOSIS — K219 Gastro-esophageal reflux disease without esophagitis: Secondary | ICD-10-CM | POA: Diagnosis not present

## 2016-12-29 DIAGNOSIS — E785 Hyperlipidemia, unspecified: Secondary | ICD-10-CM | POA: Diagnosis not present

## 2016-12-29 DIAGNOSIS — I10 Essential (primary) hypertension: Secondary | ICD-10-CM | POA: Diagnosis not present

## 2016-12-30 DIAGNOSIS — R569 Unspecified convulsions: Secondary | ICD-10-CM | POA: Diagnosis not present

## 2016-12-30 DIAGNOSIS — I10 Essential (primary) hypertension: Secondary | ICD-10-CM | POA: Diagnosis not present

## 2016-12-30 DIAGNOSIS — C719 Malignant neoplasm of brain, unspecified: Secondary | ICD-10-CM | POA: Diagnosis not present

## 2016-12-30 DIAGNOSIS — E785 Hyperlipidemia, unspecified: Secondary | ICD-10-CM | POA: Diagnosis not present

## 2016-12-30 DIAGNOSIS — K219 Gastro-esophageal reflux disease without esophagitis: Secondary | ICD-10-CM | POA: Diagnosis not present

## 2017-01-01 DIAGNOSIS — R569 Unspecified convulsions: Secondary | ICD-10-CM | POA: Diagnosis not present

## 2017-01-01 DIAGNOSIS — I10 Essential (primary) hypertension: Secondary | ICD-10-CM | POA: Diagnosis not present

## 2017-01-01 DIAGNOSIS — K219 Gastro-esophageal reflux disease without esophagitis: Secondary | ICD-10-CM | POA: Diagnosis not present

## 2017-01-01 DIAGNOSIS — E785 Hyperlipidemia, unspecified: Secondary | ICD-10-CM | POA: Diagnosis not present

## 2017-01-01 DIAGNOSIS — C719 Malignant neoplasm of brain, unspecified: Secondary | ICD-10-CM | POA: Diagnosis not present

## 2017-01-02 DIAGNOSIS — R569 Unspecified convulsions: Secondary | ICD-10-CM | POA: Diagnosis not present

## 2017-01-02 DIAGNOSIS — E785 Hyperlipidemia, unspecified: Secondary | ICD-10-CM | POA: Diagnosis not present

## 2017-01-02 DIAGNOSIS — K219 Gastro-esophageal reflux disease without esophagitis: Secondary | ICD-10-CM | POA: Diagnosis not present

## 2017-01-02 DIAGNOSIS — C719 Malignant neoplasm of brain, unspecified: Secondary | ICD-10-CM | POA: Diagnosis not present

## 2017-01-02 DIAGNOSIS — I10 Essential (primary) hypertension: Secondary | ICD-10-CM | POA: Diagnosis not present

## 2017-01-03 DIAGNOSIS — R569 Unspecified convulsions: Secondary | ICD-10-CM | POA: Diagnosis not present

## 2017-01-03 DIAGNOSIS — K219 Gastro-esophageal reflux disease without esophagitis: Secondary | ICD-10-CM | POA: Diagnosis not present

## 2017-01-03 DIAGNOSIS — E785 Hyperlipidemia, unspecified: Secondary | ICD-10-CM | POA: Diagnosis not present

## 2017-01-03 DIAGNOSIS — C719 Malignant neoplasm of brain, unspecified: Secondary | ICD-10-CM | POA: Diagnosis not present

## 2017-01-03 DIAGNOSIS — I10 Essential (primary) hypertension: Secondary | ICD-10-CM | POA: Diagnosis not present

## 2017-01-05 DIAGNOSIS — C719 Malignant neoplasm of brain, unspecified: Secondary | ICD-10-CM | POA: Diagnosis not present

## 2017-01-05 DIAGNOSIS — E785 Hyperlipidemia, unspecified: Secondary | ICD-10-CM | POA: Diagnosis not present

## 2017-01-05 DIAGNOSIS — R569 Unspecified convulsions: Secondary | ICD-10-CM | POA: Diagnosis not present

## 2017-01-05 DIAGNOSIS — I10 Essential (primary) hypertension: Secondary | ICD-10-CM | POA: Diagnosis not present

## 2017-01-05 DIAGNOSIS — K219 Gastro-esophageal reflux disease without esophagitis: Secondary | ICD-10-CM | POA: Diagnosis not present

## 2017-01-06 DIAGNOSIS — E785 Hyperlipidemia, unspecified: Secondary | ICD-10-CM | POA: Diagnosis not present

## 2017-01-06 DIAGNOSIS — I10 Essential (primary) hypertension: Secondary | ICD-10-CM | POA: Diagnosis not present

## 2017-01-06 DIAGNOSIS — R569 Unspecified convulsions: Secondary | ICD-10-CM | POA: Diagnosis not present

## 2017-01-06 DIAGNOSIS — K219 Gastro-esophageal reflux disease without esophagitis: Secondary | ICD-10-CM | POA: Diagnosis not present

## 2017-01-06 DIAGNOSIS — C719 Malignant neoplasm of brain, unspecified: Secondary | ICD-10-CM | POA: Diagnosis not present

## 2017-01-07 DIAGNOSIS — R569 Unspecified convulsions: Secondary | ICD-10-CM | POA: Diagnosis not present

## 2017-01-07 DIAGNOSIS — I10 Essential (primary) hypertension: Secondary | ICD-10-CM | POA: Diagnosis not present

## 2017-01-07 DIAGNOSIS — C719 Malignant neoplasm of brain, unspecified: Secondary | ICD-10-CM | POA: Diagnosis not present

## 2017-01-07 DIAGNOSIS — E785 Hyperlipidemia, unspecified: Secondary | ICD-10-CM | POA: Diagnosis not present

## 2017-01-07 DIAGNOSIS — K219 Gastro-esophageal reflux disease without esophagitis: Secondary | ICD-10-CM | POA: Diagnosis not present

## 2017-01-10 DIAGNOSIS — E785 Hyperlipidemia, unspecified: Secondary | ICD-10-CM | POA: Diagnosis not present

## 2017-01-10 DIAGNOSIS — R569 Unspecified convulsions: Secondary | ICD-10-CM | POA: Diagnosis not present

## 2017-01-10 DIAGNOSIS — C719 Malignant neoplasm of brain, unspecified: Secondary | ICD-10-CM | POA: Diagnosis not present

## 2017-01-10 DIAGNOSIS — I10 Essential (primary) hypertension: Secondary | ICD-10-CM | POA: Diagnosis not present

## 2017-01-10 DIAGNOSIS — K219 Gastro-esophageal reflux disease without esophagitis: Secondary | ICD-10-CM | POA: Diagnosis not present

## 2017-01-12 DIAGNOSIS — C719 Malignant neoplasm of brain, unspecified: Secondary | ICD-10-CM | POA: Diagnosis not present

## 2017-01-12 DIAGNOSIS — R569 Unspecified convulsions: Secondary | ICD-10-CM | POA: Diagnosis not present

## 2017-01-12 DIAGNOSIS — I10 Essential (primary) hypertension: Secondary | ICD-10-CM | POA: Diagnosis not present

## 2017-01-12 DIAGNOSIS — K219 Gastro-esophageal reflux disease without esophagitis: Secondary | ICD-10-CM | POA: Diagnosis not present

## 2017-01-12 DIAGNOSIS — E785 Hyperlipidemia, unspecified: Secondary | ICD-10-CM | POA: Diagnosis not present

## 2017-01-16 DIAGNOSIS — K219 Gastro-esophageal reflux disease without esophagitis: Secondary | ICD-10-CM | POA: Diagnosis not present

## 2017-01-16 DIAGNOSIS — I10 Essential (primary) hypertension: Secondary | ICD-10-CM | POA: Diagnosis not present

## 2017-01-16 DIAGNOSIS — R569 Unspecified convulsions: Secondary | ICD-10-CM | POA: Diagnosis not present

## 2017-01-16 DIAGNOSIS — C719 Malignant neoplasm of brain, unspecified: Secondary | ICD-10-CM | POA: Diagnosis not present

## 2017-01-16 DIAGNOSIS — E785 Hyperlipidemia, unspecified: Secondary | ICD-10-CM | POA: Diagnosis not present

## 2017-01-17 DIAGNOSIS — R569 Unspecified convulsions: Secondary | ICD-10-CM | POA: Diagnosis not present

## 2017-01-17 DIAGNOSIS — E785 Hyperlipidemia, unspecified: Secondary | ICD-10-CM | POA: Diagnosis not present

## 2017-01-17 DIAGNOSIS — C719 Malignant neoplasm of brain, unspecified: Secondary | ICD-10-CM | POA: Diagnosis not present

## 2017-01-17 DIAGNOSIS — K219 Gastro-esophageal reflux disease without esophagitis: Secondary | ICD-10-CM | POA: Diagnosis not present

## 2017-01-17 DIAGNOSIS — I10 Essential (primary) hypertension: Secondary | ICD-10-CM | POA: Diagnosis not present

## 2017-01-19 DIAGNOSIS — I10 Essential (primary) hypertension: Secondary | ICD-10-CM | POA: Diagnosis not present

## 2017-01-19 DIAGNOSIS — E785 Hyperlipidemia, unspecified: Secondary | ICD-10-CM | POA: Diagnosis not present

## 2017-01-19 DIAGNOSIS — C719 Malignant neoplasm of brain, unspecified: Secondary | ICD-10-CM | POA: Diagnosis not present

## 2017-01-19 DIAGNOSIS — K219 Gastro-esophageal reflux disease without esophagitis: Secondary | ICD-10-CM | POA: Diagnosis not present

## 2017-01-19 DIAGNOSIS — R569 Unspecified convulsions: Secondary | ICD-10-CM | POA: Diagnosis not present

## 2017-01-24 DIAGNOSIS — I10 Essential (primary) hypertension: Secondary | ICD-10-CM | POA: Diagnosis not present

## 2017-01-24 DIAGNOSIS — R569 Unspecified convulsions: Secondary | ICD-10-CM | POA: Diagnosis not present

## 2017-01-24 DIAGNOSIS — E785 Hyperlipidemia, unspecified: Secondary | ICD-10-CM | POA: Diagnosis not present

## 2017-01-24 DIAGNOSIS — K219 Gastro-esophageal reflux disease without esophagitis: Secondary | ICD-10-CM | POA: Diagnosis not present

## 2017-01-24 DIAGNOSIS — C719 Malignant neoplasm of brain, unspecified: Secondary | ICD-10-CM | POA: Diagnosis not present

## 2017-01-26 DIAGNOSIS — R569 Unspecified convulsions: Secondary | ICD-10-CM | POA: Diagnosis not present

## 2017-01-26 DIAGNOSIS — C719 Malignant neoplasm of brain, unspecified: Secondary | ICD-10-CM | POA: Diagnosis not present

## 2017-01-26 DIAGNOSIS — E785 Hyperlipidemia, unspecified: Secondary | ICD-10-CM | POA: Diagnosis not present

## 2017-01-26 DIAGNOSIS — I10 Essential (primary) hypertension: Secondary | ICD-10-CM | POA: Diagnosis not present

## 2017-01-26 DIAGNOSIS — K219 Gastro-esophageal reflux disease without esophagitis: Secondary | ICD-10-CM | POA: Diagnosis not present

## 2017-01-27 DIAGNOSIS — R569 Unspecified convulsions: Secondary | ICD-10-CM | POA: Diagnosis not present

## 2017-01-27 DIAGNOSIS — E785 Hyperlipidemia, unspecified: Secondary | ICD-10-CM | POA: Diagnosis not present

## 2017-01-27 DIAGNOSIS — C719 Malignant neoplasm of brain, unspecified: Secondary | ICD-10-CM | POA: Diagnosis not present

## 2017-01-27 DIAGNOSIS — I10 Essential (primary) hypertension: Secondary | ICD-10-CM | POA: Diagnosis not present

## 2017-01-27 DIAGNOSIS — K219 Gastro-esophageal reflux disease without esophagitis: Secondary | ICD-10-CM | POA: Diagnosis not present

## 2017-01-29 DIAGNOSIS — R569 Unspecified convulsions: Secondary | ICD-10-CM | POA: Diagnosis not present

## 2017-01-29 DIAGNOSIS — E785 Hyperlipidemia, unspecified: Secondary | ICD-10-CM | POA: Diagnosis not present

## 2017-01-29 DIAGNOSIS — I10 Essential (primary) hypertension: Secondary | ICD-10-CM | POA: Diagnosis not present

## 2017-01-29 DIAGNOSIS — K219 Gastro-esophageal reflux disease without esophagitis: Secondary | ICD-10-CM | POA: Diagnosis not present

## 2017-01-29 DIAGNOSIS — C719 Malignant neoplasm of brain, unspecified: Secondary | ICD-10-CM | POA: Diagnosis not present

## 2017-01-30 DIAGNOSIS — R569 Unspecified convulsions: Secondary | ICD-10-CM | POA: Diagnosis not present

## 2017-01-30 DIAGNOSIS — K219 Gastro-esophageal reflux disease without esophagitis: Secondary | ICD-10-CM | POA: Diagnosis not present

## 2017-01-30 DIAGNOSIS — I10 Essential (primary) hypertension: Secondary | ICD-10-CM | POA: Diagnosis not present

## 2017-01-30 DIAGNOSIS — E785 Hyperlipidemia, unspecified: Secondary | ICD-10-CM | POA: Diagnosis not present

## 2017-01-30 DIAGNOSIS — C719 Malignant neoplasm of brain, unspecified: Secondary | ICD-10-CM | POA: Diagnosis not present

## 2017-01-31 DIAGNOSIS — I10 Essential (primary) hypertension: Secondary | ICD-10-CM | POA: Diagnosis not present

## 2017-01-31 DIAGNOSIS — C719 Malignant neoplasm of brain, unspecified: Secondary | ICD-10-CM | POA: Diagnosis not present

## 2017-01-31 DIAGNOSIS — R569 Unspecified convulsions: Secondary | ICD-10-CM | POA: Diagnosis not present

## 2017-01-31 DIAGNOSIS — E785 Hyperlipidemia, unspecified: Secondary | ICD-10-CM | POA: Diagnosis not present

## 2017-01-31 DIAGNOSIS — K219 Gastro-esophageal reflux disease without esophagitis: Secondary | ICD-10-CM | POA: Diagnosis not present

## 2017-02-02 DIAGNOSIS — C719 Malignant neoplasm of brain, unspecified: Secondary | ICD-10-CM | POA: Diagnosis not present

## 2017-02-02 DIAGNOSIS — R569 Unspecified convulsions: Secondary | ICD-10-CM | POA: Diagnosis not present

## 2017-02-02 DIAGNOSIS — E785 Hyperlipidemia, unspecified: Secondary | ICD-10-CM | POA: Diagnosis not present

## 2017-02-02 DIAGNOSIS — K219 Gastro-esophageal reflux disease without esophagitis: Secondary | ICD-10-CM | POA: Diagnosis not present

## 2017-02-02 DIAGNOSIS — I10 Essential (primary) hypertension: Secondary | ICD-10-CM | POA: Diagnosis not present

## 2017-02-07 DIAGNOSIS — K219 Gastro-esophageal reflux disease without esophagitis: Secondary | ICD-10-CM | POA: Diagnosis not present

## 2017-02-07 DIAGNOSIS — I10 Essential (primary) hypertension: Secondary | ICD-10-CM | POA: Diagnosis not present

## 2017-02-07 DIAGNOSIS — C719 Malignant neoplasm of brain, unspecified: Secondary | ICD-10-CM | POA: Diagnosis not present

## 2017-02-07 DIAGNOSIS — E785 Hyperlipidemia, unspecified: Secondary | ICD-10-CM | POA: Diagnosis not present

## 2017-02-07 DIAGNOSIS — R569 Unspecified convulsions: Secondary | ICD-10-CM | POA: Diagnosis not present

## 2017-02-08 DIAGNOSIS — I10 Essential (primary) hypertension: Secondary | ICD-10-CM | POA: Diagnosis not present

## 2017-02-08 DIAGNOSIS — R569 Unspecified convulsions: Secondary | ICD-10-CM | POA: Diagnosis not present

## 2017-02-08 DIAGNOSIS — C719 Malignant neoplasm of brain, unspecified: Secondary | ICD-10-CM | POA: Diagnosis not present

## 2017-02-08 DIAGNOSIS — E785 Hyperlipidemia, unspecified: Secondary | ICD-10-CM | POA: Diagnosis not present

## 2017-02-08 DIAGNOSIS — K219 Gastro-esophageal reflux disease without esophagitis: Secondary | ICD-10-CM | POA: Diagnosis not present

## 2017-02-09 DIAGNOSIS — I10 Essential (primary) hypertension: Secondary | ICD-10-CM | POA: Diagnosis not present

## 2017-02-09 DIAGNOSIS — E785 Hyperlipidemia, unspecified: Secondary | ICD-10-CM | POA: Diagnosis not present

## 2017-02-09 DIAGNOSIS — K219 Gastro-esophageal reflux disease without esophagitis: Secondary | ICD-10-CM | POA: Diagnosis not present

## 2017-02-09 DIAGNOSIS — R569 Unspecified convulsions: Secondary | ICD-10-CM | POA: Diagnosis not present

## 2017-02-09 DIAGNOSIS — C719 Malignant neoplasm of brain, unspecified: Secondary | ICD-10-CM | POA: Diagnosis not present

## 2017-02-14 DIAGNOSIS — K219 Gastro-esophageal reflux disease without esophagitis: Secondary | ICD-10-CM | POA: Diagnosis not present

## 2017-02-14 DIAGNOSIS — R569 Unspecified convulsions: Secondary | ICD-10-CM | POA: Diagnosis not present

## 2017-02-14 DIAGNOSIS — C719 Malignant neoplasm of brain, unspecified: Secondary | ICD-10-CM | POA: Diagnosis not present

## 2017-02-14 DIAGNOSIS — E785 Hyperlipidemia, unspecified: Secondary | ICD-10-CM | POA: Diagnosis not present

## 2017-02-14 DIAGNOSIS — I10 Essential (primary) hypertension: Secondary | ICD-10-CM | POA: Diagnosis not present

## 2017-02-16 DIAGNOSIS — I10 Essential (primary) hypertension: Secondary | ICD-10-CM | POA: Diagnosis not present

## 2017-02-16 DIAGNOSIS — K219 Gastro-esophageal reflux disease without esophagitis: Secondary | ICD-10-CM | POA: Diagnosis not present

## 2017-02-16 DIAGNOSIS — E785 Hyperlipidemia, unspecified: Secondary | ICD-10-CM | POA: Diagnosis not present

## 2017-02-16 DIAGNOSIS — R569 Unspecified convulsions: Secondary | ICD-10-CM | POA: Diagnosis not present

## 2017-02-16 DIAGNOSIS — C719 Malignant neoplasm of brain, unspecified: Secondary | ICD-10-CM | POA: Diagnosis not present

## 2017-02-18 ENCOUNTER — Emergency Department (HOSPITAL_COMMUNITY)
Admission: EM | Admit: 2017-02-18 | Discharge: 2017-02-19 | Disposition: A | Discharge status: Home / Self Care | Attending: Emergency Medicine | Admitting: Emergency Medicine

## 2017-02-18 ENCOUNTER — Encounter (HOSPITAL_COMMUNITY): Payer: Self-pay | Admitting: Emergency Medicine

## 2017-02-18 ENCOUNTER — Emergency Department (HOSPITAL_COMMUNITY)

## 2017-02-18 DIAGNOSIS — F419 Anxiety disorder, unspecified: Secondary | ICD-10-CM | POA: Diagnosis not present

## 2017-02-18 DIAGNOSIS — W06XXXA Fall from bed, initial encounter: Secondary | ICD-10-CM | POA: Diagnosis not present

## 2017-02-18 DIAGNOSIS — Y9389 Activity, other specified: Secondary | ICD-10-CM | POA: Diagnosis not present

## 2017-02-18 DIAGNOSIS — R569 Unspecified convulsions: Secondary | ICD-10-CM | POA: Diagnosis not present

## 2017-02-18 DIAGNOSIS — Y92122 Bedroom in nursing home as the place of occurrence of the external cause: Secondary | ICD-10-CM | POA: Diagnosis not present

## 2017-02-18 DIAGNOSIS — C719 Malignant neoplasm of brain, unspecified: Secondary | ICD-10-CM | POA: Diagnosis not present

## 2017-02-18 DIAGNOSIS — Z91040 Latex allergy status: Secondary | ICD-10-CM | POA: Insufficient documentation

## 2017-02-18 DIAGNOSIS — F329 Major depressive disorder, single episode, unspecified: Secondary | ICD-10-CM | POA: Diagnosis not present

## 2017-02-18 DIAGNOSIS — Z79899 Other long term (current) drug therapy: Secondary | ICD-10-CM | POA: Diagnosis not present

## 2017-02-18 DIAGNOSIS — S0083XA Contusion of other part of head, initial encounter: Secondary | ICD-10-CM | POA: Diagnosis not present

## 2017-02-18 DIAGNOSIS — Y998 Other external cause status: Secondary | ICD-10-CM | POA: Diagnosis not present

## 2017-02-18 DIAGNOSIS — K219 Gastro-esophageal reflux disease without esophagitis: Secondary | ICD-10-CM | POA: Diagnosis not present

## 2017-02-18 DIAGNOSIS — I10 Essential (primary) hypertension: Secondary | ICD-10-CM | POA: Diagnosis not present

## 2017-02-18 DIAGNOSIS — W19XXXA Unspecified fall, initial encounter: Secondary | ICD-10-CM

## 2017-02-18 DIAGNOSIS — S0990XA Unspecified injury of head, initial encounter: Secondary | ICD-10-CM | POA: Diagnosis present

## 2017-02-18 DIAGNOSIS — E785 Hyperlipidemia, unspecified: Secondary | ICD-10-CM | POA: Diagnosis not present

## 2017-02-18 NOTE — ED Notes (Signed)
PT states understanding of care given. PT going by [facility]

## 2017-02-18 NOTE — Discharge Instructions (Signed)
Place ice on forehead to reduce swelling Return for worsening symptoms

## 2017-02-18 NOTE — ED Triage Notes (Addendum)
Pt from [facility] home. Per EMS, pt fell out of bed tonight around 1815. Pt has a hematoma to rt side of forehead. Pt not on blood thinners. Pt has a hx of a tumor removal from rt side of brain last year which pt has left sided deficits. Pt has flaccid left arm and minimal movement in left leg. Pt reports some confusion sometimes. These deficits are now normal for her.  Pt c/o of a headache. Denies dizziness, sob, and N&V. Pt A&O x3. Pt disoriented to year but knows month. EMS VS 128/62, HR 98, 18 RR, 96% SpO2.

## 2017-02-19 DIAGNOSIS — R4182 Altered mental status, unspecified: Secondary | ICD-10-CM | POA: Diagnosis not present

## 2017-02-19 DIAGNOSIS — K219 Gastro-esophageal reflux disease without esophagitis: Secondary | ICD-10-CM | POA: Diagnosis not present

## 2017-02-19 DIAGNOSIS — C719 Malignant neoplasm of brain, unspecified: Secondary | ICD-10-CM | POA: Diagnosis not present

## 2017-02-19 DIAGNOSIS — S062X9D Diffuse traumatic brain injury with loss of consciousness of unspecified duration, subsequent encounter: Secondary | ICD-10-CM | POA: Diagnosis not present

## 2017-02-19 DIAGNOSIS — R569 Unspecified convulsions: Secondary | ICD-10-CM | POA: Diagnosis not present

## 2017-02-19 DIAGNOSIS — I10 Essential (primary) hypertension: Secondary | ICD-10-CM | POA: Diagnosis not present

## 2017-02-19 DIAGNOSIS — E785 Hyperlipidemia, unspecified: Secondary | ICD-10-CM | POA: Diagnosis not present

## 2017-02-19 DIAGNOSIS — C801 Malignant (primary) neoplasm, unspecified: Secondary | ICD-10-CM | POA: Diagnosis not present

## 2017-02-19 NOTE — ED Notes (Signed)
[facility] called for transport.  

## 2017-02-19 NOTE — ED Provider Notes (Signed)
[facility] DEPT Provider Note   CSN: [id number] Arrival date & time: 02/18/17  2150     History   Chief Complaint Chief Complaint  Patient presents with  . Fall    HPI Crystal Parsons is a 71 y.o. female who presents with a head injury. PMH significant for brain cancer currently on hospice. She currently resides at a SNF and tried to get out of bed this evening without calling for help and fell on to the floor which is hard. She has chronic left sided weakness after resection of her tumor. She reports a headache over the right side of her head where there is some swelling. Otherwise she denies any other symptoms. She does not believe she had LOC. She denies dizziness, vision changes, neck pain, bleeding. She declines pain medicine.  HPI  Past Medical History:  Diagnosis Date  . Anxiety   . Bradycardia    At times with pulse in the 40s  . Brain cancer ([place])    Glioblastoma  . Diarrhea    With blating, improved with gluten-free diet  . Diastolic dysfunction   . Essential hypertension, benign    Always has HTN when at the doctor's office.  . Family history of colon cancer   . 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 Gout   . History of colon polyps   . Hyperlipidemia   . Lynch syndrome   . Mild aortic sclerosis ([place])   . Mitral valve problem    Mildly thickened mitral valve  . Mitral valve prolapse    Mild, anterior  . MR (mitral regurgitation)    ECHO 08/10/09 shows mild MR again and normal EF. No significant changes from prior ECHO.  Marland Kitchen Palpitations    Occasional, but are not significant. ECHO 08/20/08 - Normal EF (60%), mildly thickened mitral valve with mild anterior mitral valve prolapse, mild MR, mild aortic sclerosis, grade 1 diastolic dysfunction.  . Proteinuria    Likely secondary to Diabetes    Patient Active Problem List   Diagnosis Date Noted  . Genetic testing 11/15/2016  . Goals of care, counseling/discussion 10/20/2016  . History of  colon polyps   . Family history of colon cancer   . Incontinence of feces 10/05/2016  . Altered mental status 10/05/2016  . Recurrent falls 09/22/2016  . Thrombocytopenia ([place]) 09/13/2016  . Depression, major, single episode, mild ([place]) 09/13/2016  . Skin rash 08/30/2016  . Elevated liver enzymes 08/23/2016  . Anxiety attack 08/16/2016  . Other constipation 08/09/2016  . 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 Polyposis syndrome, familial 07/20/2016  . H/O: hysterectomy 07/20/2016  . Leukocytosis   . Monocytosis   . GBM (glioblastoma multiforme) ([place])   . Glioblastoma multiforme of brain ([place]) 07/11/2016  . MR (mitral regurgitation)   . Brain tumor ([place])   . Apraxia   . Left-sided weakness   . Steroid-induced hyperglycemia   . Leucocytosis   . Benign essential HTN   . History of gout   . Dysuria   . Acute respiratory failure with hypoxia ([place])   . Brain mass   . Seizure ([place]) 06/30/2016  . Status epilepticus ([place]) 06/30/2016    Past Surgical History:  Procedure Laterality Date  . ABDOMINAL HYSTERECTOMY    . APPLICATION OF CRANIAL NAVIGATION Right 07/07/2016   Procedure: APPLICATION OF CRANIAL NAVIGATION;  Surgeon: Consuella Lose, MD;  Location: [facility];  Service: Neurosurgery;  Laterality: Right;  . BREAST LUMPECTOMY Left   .  CRANIOTOMY Right 07/07/2016   Procedure: CRANIOTOMY TUMOR EXCISION w/BrainLab;  Surgeon: Consuella Lose, MD;  Location: [facility];  Service: Neurosurgery;  Laterality: Right;  . HEMORROIDECTOMY  2009  . TUBAL LIGATION      OB History    No data available       Home Medications    Prior to Admission medications   Medication Sig Start Date End Date Taking? Authorizing Provider  acetaminophen (TYLENOL) 500 MG tablet Take 1,000 mg by mouth every 6 (six) hours as needed for mild pain or moderate pain.    [provider]  dexamethasone (DECADRON) 1 MG tablet Take 1 tablet (1 mg total) by mouth daily. 10/10/16   Heath Lark, MD    diphenhydrAMINE (BENADRYL) 25 MG tablet Take 25 mg by mouth every 6 (six) hours as needed for allergies.    [provider]  famotidine (PEPCID) 20 MG tablet Take 1 tablet (20 mg total) by mouth 2 (two) times daily. Patient not taking: Reported on 12/24/2016 08/16/16   Heath Lark, MD  levETIRAcetam (KEPPRA) 500 MG tablet Take 1 tablet (500 mg total) by mouth 2 (two) times daily. 10/10/16   Heath Lark, MD  LORazepam (ATIVAN) 0.5 MG tablet Take 1 tablet (0.5 mg total) by mouth 2 (two) times daily as needed for anxiety. 08/16/16   Heath Lark, MD  ondansetron (ZOFRAN) 8 MG tablet Take 1 tablet (8 mg total) by mouth every 8 (eight) hours as needed for nausea. Patient not taking: Reported on 12/24/2016 07/22/16   Heath Lark, MD  senna-docusate (SENOKOT-S) 8.6-50 MG tablet Take 2 tablets by mouth at bedtime. Patient taking differently: Take 1 tablet by mouth at bedtime.  07/21/16   Love, Ivan Anchors, PA-C  sulfamethoxazole-trimethoprim (BACTRIM DS,SEPTRA DS) 800-160 MG tablet Take 1 tablet by mouth 3 (three) times a week. 08/17/16   Heath Lark, MD    Family History Family History  Problem Relation Age of Onset  . Cancer Mother        lung cancer and colon ca  . Colon cancer Mother   . Cancer Father   . Colon cancer Father 47  . Diabetes Father   . Heart attack Maternal Uncle   . Colon cancer Maternal Grandmother 53  . Dementia Maternal Grandfather   . Heart attack Paternal Grandfather     Social History Social History  Substance Use Topics  . Smoking status: Never Smoker  . Smokeless tobacco: Never Used  . Alcohol use Yes     Comment: socially     Allergies   Accupril [quinapril hcl]; Latex; Naproxen; Vitamin e; and Zoloft [sertraline hcl]   Review of Systems Review of Systems  Musculoskeletal: Negative for neck pain.  Skin: Negative for wound.  Neurological: Positive for weakness (chronic left sided) and headaches. Negative for dizziness, syncope and numbness.  All  other systems reviewed and are negative.    Physical Exam Updated Vital Signs BP 114/70 (BP Location: Right Arm)   Pulse 84   Temp 97.9 F (36.6 C) (Oral)   Resp 16   Ht 5\' 3"  (1.6 m)   Wt 77.1 kg (170 lb)   SpO2 96%   BMI 30.11 kg/m   Physical Exam  Constitutional: She is oriented to person, place, and time. She appears well-developed and well-nourished. No distress.  Chronically ill appearing elderly female in NAD  HENT:  Head: Normocephalic.  Hematoma over right forehead. No open wounds  Eyes: Pupils are equal, round, and reactive to light.  Conjunctivae are normal. Right eye exhibits no discharge. Left eye exhibits no discharge. No scleral icterus.  Neck: Normal range of motion.  No midline tenderness  Cardiovascular: Normal rate.   Pulmonary/Chest: Effort normal. No respiratory distress.  Abdominal: She exhibits no distension.  Neurological: She is alert and oriented to person, place, and time.  Left sided weakness. She is moving the right side  Skin: Skin is warm and dry.  Psychiatric: She has a normal mood and affect. Her behavior is normal.  Nursing note and vitals reviewed.    ED Treatments / Results  Labs (all labs ordered are listed, but only abnormal results are displayed) Labs Reviewed - No data to display  EKG  EKG Interpretation None       Radiology Ct Head Wo Contrast  Result Date: 02/18/2017 CLINICAL DATA:  Fall from bed.  History of brain tumor resection. EXAM: CT HEAD WITHOUT CONTRAST CT CERVICAL SPINE WITHOUT CONTRAST TECHNIQUE: Multidetector CT imaging of the head and cervical spine was performed following the standard protocol without intravenous contrast. Multiplanar CT image reconstructions of the cervical spine were also generated. COMPARISON:  Head CT 12/24/2016 Brain MRI 10/06/2016 FINDINGS: CT HEAD FINDINGS Brain: Mass lesion in the right centrum semiovale has increased in size, now measuring 19 x 18 mm, previously 10 x 8 mm. The  surrounding vasogenic edema has also worsened, now extending into the right parietal and temporal white matter. The appearance of the frontal pole resection site is unchanged aside from slight decrease in the size of the fluid collection at the operative bed. There is unchanged periventricular hypoattenuation consistent with chronic microvascular ischemia. No acute hemorrhage. 2 mm of leftward midline shift. No hydrocephalus. Vascular: Atherosclerotic calcification of the vertebral arteries at the skull base. Skull: Remote right frontal craniotomy. Sinuses/Orbits: No sinus fluid levels or advanced mucosal thickening. No mastoid effusion. Normal orbits. CT CERVICAL SPINE FINDINGS Alignment: No static subluxation. Facets are aligned. Occipital condyles are normally positioned. Skull base and vertebrae: No acute fracture. Soft tissues and spinal canal: No prevertebral fluid or swelling. No visible canal hematoma. Disc levels: Moderate spinal canal stenosis at C5-C6 secondary to disc osteophyte complex. Upper chest: No pneumothorax, pulmonary nodule or pleural effusion. Other: Normal visualized paraspinal cervical soft tissues. IMPRESSION: 1. No acute hemorrhage or other traumatic abnormality of the brain. 2. Increased size of the tumor centered in the right frontal centrum semiovale, now measuring 1.9 x 1.8 cm, with markedly increased surrounding vasogenic edema that now extends into the right parietal and temporal white matter. 3. No acute fracture or static subluxation of the cervical spine. 4. Multilevel degenerative disc disease with at least moderate spinal canal stenosis at C5-C6. Electronically Signed   By: Ulyses Jarred M.D.   On: 02/18/2017 23:08   Ct Cervical Spine Wo Contrast  Result Date: 02/18/2017 CLINICAL DATA:  Fall from bed.  History of brain tumor resection. EXAM: CT HEAD WITHOUT CONTRAST CT CERVICAL SPINE WITHOUT CONTRAST TECHNIQUE: Multidetector CT imaging of the head and cervical spine was  performed following the standard protocol without intravenous contrast. Multiplanar CT image reconstructions of the cervical spine were also generated. COMPARISON:  Head CT 12/24/2016 Brain MRI 10/06/2016 FINDINGS: CT HEAD FINDINGS Brain: Mass lesion in the right centrum semiovale has increased in size, now measuring 19 x 18 mm, previously 10 x 8 mm. The surrounding vasogenic edema has also worsened, now extending into the right parietal and temporal white matter. The appearance of the frontal pole resection site is unchanged  aside from slight decrease in the size of the fluid collection at the operative bed. There is unchanged periventricular hypoattenuation consistent with chronic microvascular ischemia. No acute hemorrhage. 2 mm of leftward midline shift. No hydrocephalus. Vascular: Atherosclerotic calcification of the vertebral arteries at the skull base. Skull: Remote right frontal craniotomy. Sinuses/Orbits: No sinus fluid levels or advanced mucosal thickening. No mastoid effusion. Normal orbits. CT CERVICAL SPINE FINDINGS Alignment: No static subluxation. Facets are aligned. Occipital condyles are normally positioned. Skull base and vertebrae: No acute fracture. Soft tissues and spinal canal: No prevertebral fluid or swelling. No visible canal hematoma. Disc levels: Moderate spinal canal stenosis at C5-C6 secondary to disc osteophyte complex. Upper chest: No pneumothorax, pulmonary nodule or pleural effusion. Other: Normal visualized paraspinal cervical soft tissues. IMPRESSION: 1. No acute hemorrhage or other traumatic abnormality of the brain. 2. Increased size of the tumor centered in the right frontal centrum semiovale, now measuring 1.9 x 1.8 cm, with markedly increased surrounding vasogenic edema that now extends into the right parietal and temporal white matter. 3. No acute fracture or static subluxation of the cervical spine. 4. Multilevel degenerative disc disease with at least moderate spinal canal  stenosis at C5-C6. Electronically Signed   By: Ulyses Jarred M.D.   On: 02/18/2017 23:08    Procedures Procedures (including critical care time)  Medications Ordered in ED Medications - No data to display   Initial Impression / Assessment and Plan / ED Course  I have reviewed the triage vital signs and the nursing notes.  Pertinent labs & imaging results that were available during my care of the patient were reviewed by me and considered in my medical decision making (see chart for details).  71 year old female with a minor head injury and hematoma. CT head is remarkable for increased size of tumor with surrounding edema. She is on Decadron. CT neck is unremarkable for acute changes. Discussed results with her daughter and patient's husband in the room. Shared visit with Dr. Lacinda Axon. Will dc pt back to SNF.   Final Clinical Impressions(s) / ED Diagnoses   Final diagnoses:  Fall, initial encounter  Contusion of face, initial encounter    New Prescriptions New Prescriptions   No medications on file     Iris Pert 02/19/17 1755    Nat Christen, MD 02/22/17 [phone]

## 2017-02-21 DIAGNOSIS — K219 Gastro-esophageal reflux disease without esophagitis: Secondary | ICD-10-CM | POA: Diagnosis not present

## 2017-02-21 DIAGNOSIS — I10 Essential (primary) hypertension: Secondary | ICD-10-CM | POA: Diagnosis not present

## 2017-02-21 DIAGNOSIS — E785 Hyperlipidemia, unspecified: Secondary | ICD-10-CM | POA: Diagnosis not present

## 2017-02-21 DIAGNOSIS — R569 Unspecified convulsions: Secondary | ICD-10-CM | POA: Diagnosis not present

## 2017-02-21 DIAGNOSIS — C719 Malignant neoplasm of brain, unspecified: Secondary | ICD-10-CM | POA: Diagnosis not present

## 2017-02-23 DIAGNOSIS — E785 Hyperlipidemia, unspecified: Secondary | ICD-10-CM | POA: Diagnosis not present

## 2017-02-23 DIAGNOSIS — I10 Essential (primary) hypertension: Secondary | ICD-10-CM | POA: Diagnosis not present

## 2017-02-23 DIAGNOSIS — F419 Anxiety disorder, unspecified: Secondary | ICD-10-CM | POA: Diagnosis not present

## 2017-02-23 DIAGNOSIS — K5900 Constipation, unspecified: Secondary | ICD-10-CM | POA: Diagnosis not present

## 2017-02-23 DIAGNOSIS — K219 Gastro-esophageal reflux disease without esophagitis: Secondary | ICD-10-CM | POA: Diagnosis not present

## 2017-02-23 DIAGNOSIS — R569 Unspecified convulsions: Secondary | ICD-10-CM | POA: Diagnosis not present

## 2017-02-23 DIAGNOSIS — C719 Malignant neoplasm of brain, unspecified: Secondary | ICD-10-CM | POA: Diagnosis not present

## 2017-02-23 DIAGNOSIS — W19XXXD Unspecified fall, subsequent encounter: Secondary | ICD-10-CM | POA: Diagnosis not present

## 2017-02-25 DIAGNOSIS — C719 Malignant neoplasm of brain, unspecified: Secondary | ICD-10-CM | POA: Diagnosis not present

## 2017-02-25 DIAGNOSIS — K219 Gastro-esophageal reflux disease without esophagitis: Secondary | ICD-10-CM | POA: Diagnosis not present

## 2017-02-25 DIAGNOSIS — I10 Essential (primary) hypertension: Secondary | ICD-10-CM | POA: Diagnosis not present

## 2017-02-25 DIAGNOSIS — E785 Hyperlipidemia, unspecified: Secondary | ICD-10-CM | POA: Diagnosis not present

## 2017-02-25 DIAGNOSIS — R569 Unspecified convulsions: Secondary | ICD-10-CM | POA: Diagnosis not present

## 2017-02-28 DIAGNOSIS — I10 Essential (primary) hypertension: Secondary | ICD-10-CM | POA: Diagnosis not present

## 2017-02-28 DIAGNOSIS — C719 Malignant neoplasm of brain, unspecified: Secondary | ICD-10-CM | POA: Diagnosis not present

## 2017-02-28 DIAGNOSIS — R569 Unspecified convulsions: Secondary | ICD-10-CM | POA: Diagnosis not present

## 2017-02-28 DIAGNOSIS — E785 Hyperlipidemia, unspecified: Secondary | ICD-10-CM | POA: Diagnosis not present

## 2017-02-28 DIAGNOSIS — K219 Gastro-esophageal reflux disease without esophagitis: Secondary | ICD-10-CM | POA: Diagnosis not present

## 2017-03-01 DIAGNOSIS — C719 Malignant neoplasm of brain, unspecified: Secondary | ICD-10-CM | POA: Diagnosis not present

## 2017-03-01 DIAGNOSIS — R569 Unspecified convulsions: Secondary | ICD-10-CM | POA: Diagnosis not present

## 2017-03-01 DIAGNOSIS — K219 Gastro-esophageal reflux disease without esophagitis: Secondary | ICD-10-CM | POA: Diagnosis not present

## 2017-03-01 DIAGNOSIS — E785 Hyperlipidemia, unspecified: Secondary | ICD-10-CM | POA: Diagnosis not present

## 2017-03-01 DIAGNOSIS — I10 Essential (primary) hypertension: Secondary | ICD-10-CM | POA: Diagnosis not present

## 2017-03-02 DIAGNOSIS — I10 Essential (primary) hypertension: Secondary | ICD-10-CM | POA: Diagnosis not present

## 2017-03-02 DIAGNOSIS — E785 Hyperlipidemia, unspecified: Secondary | ICD-10-CM | POA: Diagnosis not present

## 2017-03-02 DIAGNOSIS — K219 Gastro-esophageal reflux disease without esophagitis: Secondary | ICD-10-CM | POA: Diagnosis not present

## 2017-03-02 DIAGNOSIS — C719 Malignant neoplasm of brain, unspecified: Secondary | ICD-10-CM | POA: Diagnosis not present

## 2017-03-02 DIAGNOSIS — R569 Unspecified convulsions: Secondary | ICD-10-CM | POA: Diagnosis not present

## 2017-03-07 DIAGNOSIS — R569 Unspecified convulsions: Secondary | ICD-10-CM | POA: Diagnosis not present

## 2017-03-07 DIAGNOSIS — K219 Gastro-esophageal reflux disease without esophagitis: Secondary | ICD-10-CM | POA: Diagnosis not present

## 2017-03-07 DIAGNOSIS — E785 Hyperlipidemia, unspecified: Secondary | ICD-10-CM | POA: Diagnosis not present

## 2017-03-07 DIAGNOSIS — I10 Essential (primary) hypertension: Secondary | ICD-10-CM | POA: Diagnosis not present

## 2017-03-07 DIAGNOSIS — C719 Malignant neoplasm of brain, unspecified: Secondary | ICD-10-CM | POA: Diagnosis not present

## 2017-03-08 DIAGNOSIS — R569 Unspecified convulsions: Secondary | ICD-10-CM | POA: Diagnosis not present

## 2017-03-08 DIAGNOSIS — C719 Malignant neoplasm of brain, unspecified: Secondary | ICD-10-CM | POA: Diagnosis not present

## 2017-03-08 DIAGNOSIS — Z66 Do not resuscitate: Secondary | ICD-10-CM | POA: Diagnosis not present

## 2017-03-09 DIAGNOSIS — C719 Malignant neoplasm of brain, unspecified: Secondary | ICD-10-CM | POA: Diagnosis not present

## 2017-03-09 DIAGNOSIS — R569 Unspecified convulsions: Secondary | ICD-10-CM | POA: Diagnosis not present

## 2017-03-09 DIAGNOSIS — K219 Gastro-esophageal reflux disease without esophagitis: Secondary | ICD-10-CM | POA: Diagnosis not present

## 2017-03-09 DIAGNOSIS — E785 Hyperlipidemia, unspecified: Secondary | ICD-10-CM | POA: Diagnosis not present

## 2017-03-09 DIAGNOSIS — I10 Essential (primary) hypertension: Secondary | ICD-10-CM | POA: Diagnosis not present

## 2017-03-14 DIAGNOSIS — K219 Gastro-esophageal reflux disease without esophagitis: Secondary | ICD-10-CM | POA: Diagnosis not present

## 2017-03-14 DIAGNOSIS — E785 Hyperlipidemia, unspecified: Secondary | ICD-10-CM | POA: Diagnosis not present

## 2017-03-14 DIAGNOSIS — C719 Malignant neoplasm of brain, unspecified: Secondary | ICD-10-CM | POA: Diagnosis not present

## 2017-03-14 DIAGNOSIS — I10 Essential (primary) hypertension: Secondary | ICD-10-CM | POA: Diagnosis not present

## 2017-03-14 DIAGNOSIS — R569 Unspecified convulsions: Secondary | ICD-10-CM | POA: Diagnosis not present

## 2017-03-16 DIAGNOSIS — K219 Gastro-esophageal reflux disease without esophagitis: Secondary | ICD-10-CM | POA: Diagnosis not present

## 2017-03-16 DIAGNOSIS — E785 Hyperlipidemia, unspecified: Secondary | ICD-10-CM | POA: Diagnosis not present

## 2017-03-16 DIAGNOSIS — C719 Malignant neoplasm of brain, unspecified: Secondary | ICD-10-CM | POA: Diagnosis not present

## 2017-03-16 DIAGNOSIS — I10 Essential (primary) hypertension: Secondary | ICD-10-CM | POA: Diagnosis not present

## 2017-03-16 DIAGNOSIS — R569 Unspecified convulsions: Secondary | ICD-10-CM | POA: Diagnosis not present

## 2017-03-21 DIAGNOSIS — E785 Hyperlipidemia, unspecified: Secondary | ICD-10-CM | POA: Diagnosis not present

## 2017-03-21 DIAGNOSIS — R569 Unspecified convulsions: Secondary | ICD-10-CM | POA: Diagnosis not present

## 2017-03-21 DIAGNOSIS — C719 Malignant neoplasm of brain, unspecified: Secondary | ICD-10-CM | POA: Diagnosis not present

## 2017-03-21 DIAGNOSIS — K219 Gastro-esophageal reflux disease without esophagitis: Secondary | ICD-10-CM | POA: Diagnosis not present

## 2017-03-21 DIAGNOSIS — I10 Essential (primary) hypertension: Secondary | ICD-10-CM | POA: Diagnosis not present

## 2017-03-22 DIAGNOSIS — C719 Malignant neoplasm of brain, unspecified: Secondary | ICD-10-CM | POA: Diagnosis not present

## 2017-03-22 DIAGNOSIS — F419 Anxiety disorder, unspecified: Secondary | ICD-10-CM | POA: Diagnosis not present

## 2017-03-22 DIAGNOSIS — R569 Unspecified convulsions: Secondary | ICD-10-CM | POA: Diagnosis not present

## 2017-03-22 DIAGNOSIS — I10 Essential (primary) hypertension: Secondary | ICD-10-CM | POA: Diagnosis not present

## 2017-03-22 DIAGNOSIS — K219 Gastro-esophageal reflux disease without esophagitis: Secondary | ICD-10-CM | POA: Diagnosis not present

## 2017-03-22 DIAGNOSIS — E785 Hyperlipidemia, unspecified: Secondary | ICD-10-CM | POA: Diagnosis not present

## 2017-03-23 DIAGNOSIS — K219 Gastro-esophageal reflux disease without esophagitis: Secondary | ICD-10-CM | POA: Diagnosis not present

## 2017-03-23 DIAGNOSIS — I10 Essential (primary) hypertension: Secondary | ICD-10-CM | POA: Diagnosis not present

## 2017-03-23 DIAGNOSIS — C719 Malignant neoplasm of brain, unspecified: Secondary | ICD-10-CM | POA: Diagnosis not present

## 2017-03-23 DIAGNOSIS — E785 Hyperlipidemia, unspecified: Secondary | ICD-10-CM | POA: Diagnosis not present

## 2017-03-23 DIAGNOSIS — R569 Unspecified convulsions: Secondary | ICD-10-CM | POA: Diagnosis not present

## 2017-03-24 DIAGNOSIS — I10 Essential (primary) hypertension: Secondary | ICD-10-CM | POA: Diagnosis not present

## 2017-03-24 DIAGNOSIS — R569 Unspecified convulsions: Secondary | ICD-10-CM | POA: Diagnosis not present

## 2017-03-24 DIAGNOSIS — C719 Malignant neoplasm of brain, unspecified: Secondary | ICD-10-CM | POA: Diagnosis not present

## 2017-03-24 DIAGNOSIS — K219 Gastro-esophageal reflux disease without esophagitis: Secondary | ICD-10-CM | POA: Diagnosis not present

## 2017-03-24 DIAGNOSIS — E785 Hyperlipidemia, unspecified: Secondary | ICD-10-CM | POA: Diagnosis not present

## 2017-03-25 DIAGNOSIS — R569 Unspecified convulsions: Secondary | ICD-10-CM | POA: Diagnosis not present

## 2017-03-25 DIAGNOSIS — C719 Malignant neoplasm of brain, unspecified: Secondary | ICD-10-CM | POA: Diagnosis not present

## 2017-03-25 DIAGNOSIS — I10 Essential (primary) hypertension: Secondary | ICD-10-CM | POA: Diagnosis not present

## 2017-03-25 DIAGNOSIS — K219 Gastro-esophageal reflux disease without esophagitis: Secondary | ICD-10-CM | POA: Diagnosis not present

## 2017-03-25 DIAGNOSIS — E785 Hyperlipidemia, unspecified: Secondary | ICD-10-CM | POA: Diagnosis not present

## 2017-03-27 ENCOUNTER — Encounter: Payer: Self-pay | Admitting: Radiation Therapy

## 2017-04-01 DEATH — deceased

## 2017-11-22 NOTE — Therapy (Signed)
[facility] [address] [place], [place], [address] Phone: [phone]   Fax:  [phone]  Patient Details  Name: Crystal Parsons MRN: [id number] Date of Birth: Dec 26, 1945 Referring Provider:  No ref. provider found  Encounter Date: 11/22/2017  SPEECH THERAPY DISCHARGE SUMMARY  Visits from Start of Care: 14  Current functional level related to goals / functional outcomes: Pt completed 14/17 scheduled sessions. She did not return to [facility] after her 14th session. Goals are listed below:  SLP Short Term Goals - 09/22/16 1202              SLP SHORT TERM GOAL #1    Title Pt will utilize external aids (notebook, timer) to manage schedule, lists, meds with rare min A over 2 sessions    Time 1    Period Weeks    Status Not Met         SLP SHORT TERM GOAL #2    Title Pt will selectively attend for 10 minutes in min noisy environment with a simple cognitive lingusitic task with 90% accuracy    Time 1    Period Weeks    Status Not Met         SLP SHORT TERM GOAL #3    Title Pt will verbalize 3 cognitive impairments over 2 sessions with rare min A    Time 1    Period Weeks    Status Not Met         SLP SHORT TERM GOAL #4    Title Pt will perform mildly complex reasoning, functional math, attention to detail problems with 85% accuracy and occasional min A    Time 1    Period Weeks    Status Not Met                       SLP Long Term Goals - 09/22/16 1202              SLP LONG TERM GOAL #1    Title Pt will demonstrate anticiaptory awareness identifying 3 errors on cognitive tasks with occasional min A    Time 2    Period Weeks    Status Achieved         SLP LONG TERM GOAL #2    Title Pt will alternate attention between 2 simple cognitive lingusitic tasks with 85% on each and occasional min A    Time 2    Period Weeks    Status On-going         SLP LONG TERM GOAL #3    Title Pt will solve simplw  reasoning, organizing and attention to detail problems with 85% accuracy and occasional min A    Time 2    Period Weeks    Status Revised      Remaining deficits: After last therapy session, pt had deficits in attention, reasoning/organization, and awareness of errors.   Education / Equipment: Compensations for cognitive linguistic deficits  Plan: Patient agrees to discharge.  Patient goals were partially met. Patient is being discharged due to not returning since the last visit.  ?????      Lakin ,Silt, CCC-SLP  11/22/2017, 12:26 PM  [facility] [address] [place], [place], [address] Phone: [phone]   Fax:  [phone]

## 2018-10-19 IMAGING — CT CT HEAD W/O CM
3 series · 15 of 47 positions shown, 18 images · non-contrast
Comparison: Brain MRI 08/09/2011

CLINICAL DATA: New onset seizures

EXAM:
CT HEAD WITHOUT CONTRAST
TECHNIQUE: Contiguous axial images were obtained from the base of the skull
through the vertex without intravenous contrast.

[Series 2: head 5.0 h30s · axial · 0.44mm/px · z∈[-60,+75]mm · 9 of 33 slices shown, 12 images]
[im 3/33  brain]
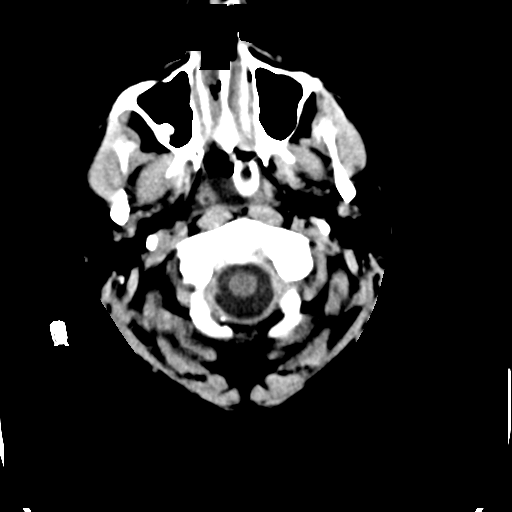
[im 3/33  bone]
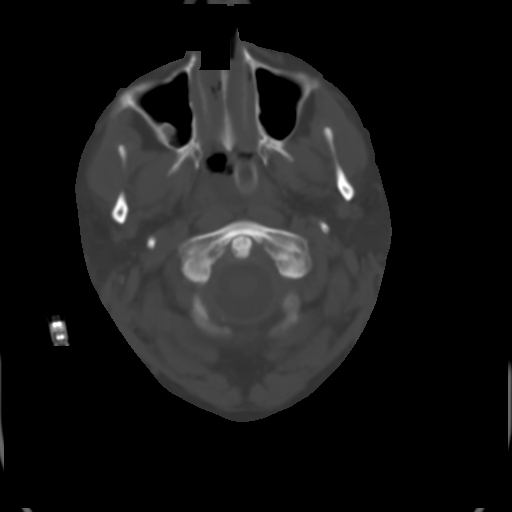
[im 6/33  brain]
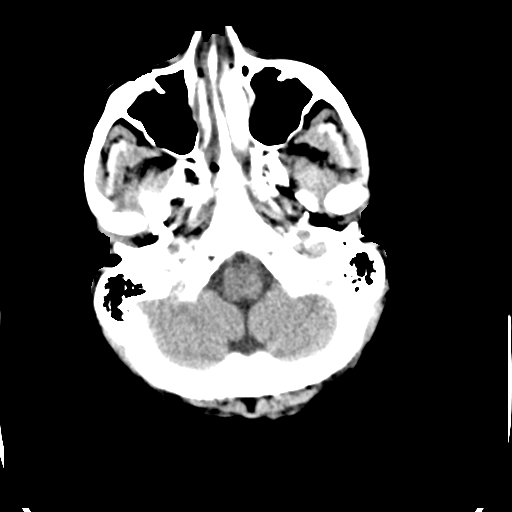
[im 9/33  brain]
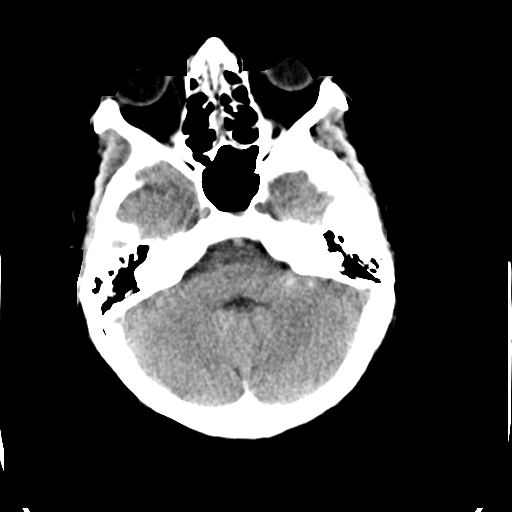
[im 13/33  brain]
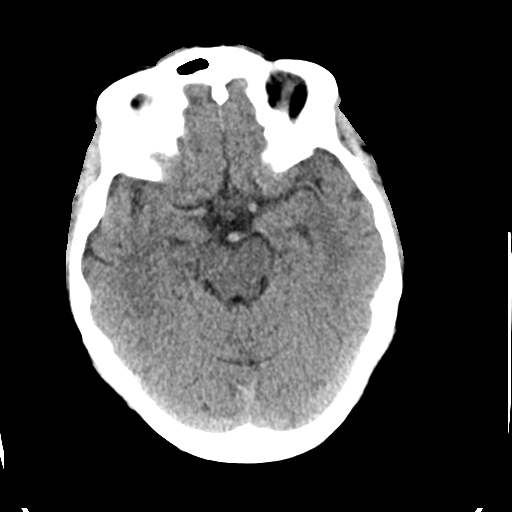
[im 17/33  brain]
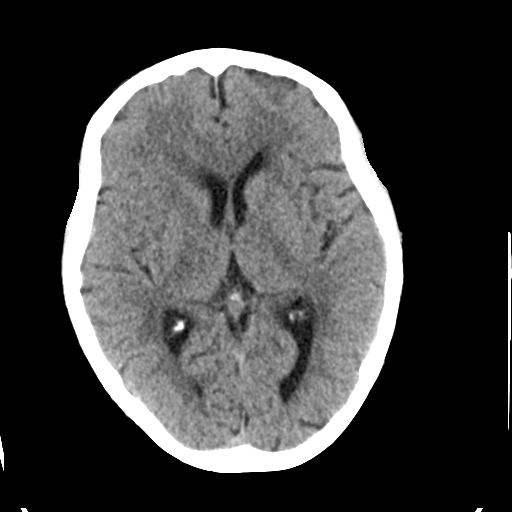
[im 17/33  bone]
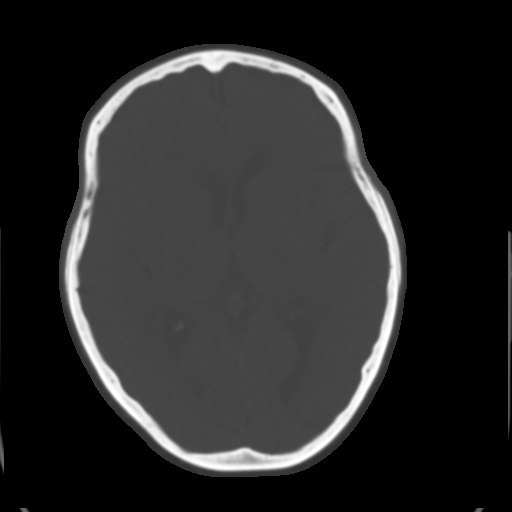
[im 20/33  brain]
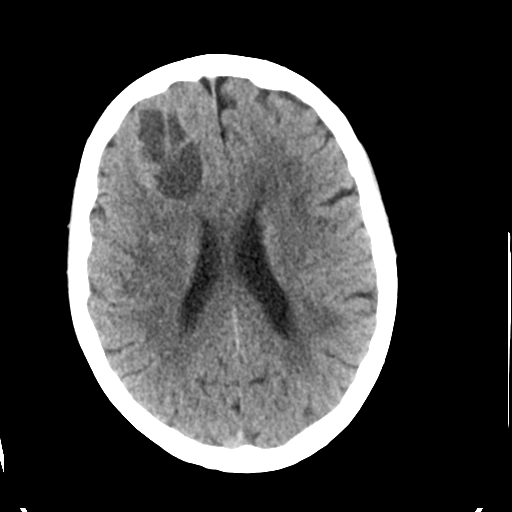
[im 24/33  brain]
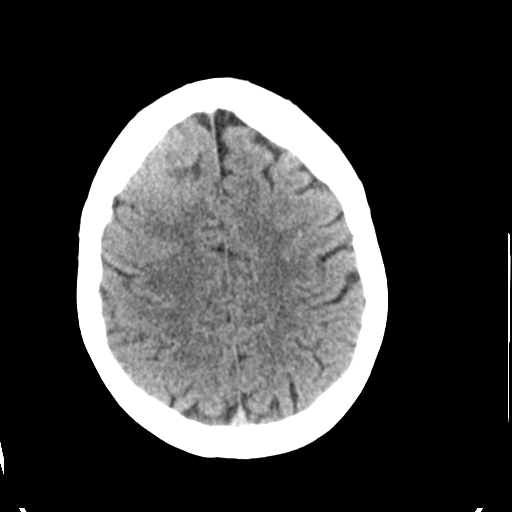
[im 27/33  brain]
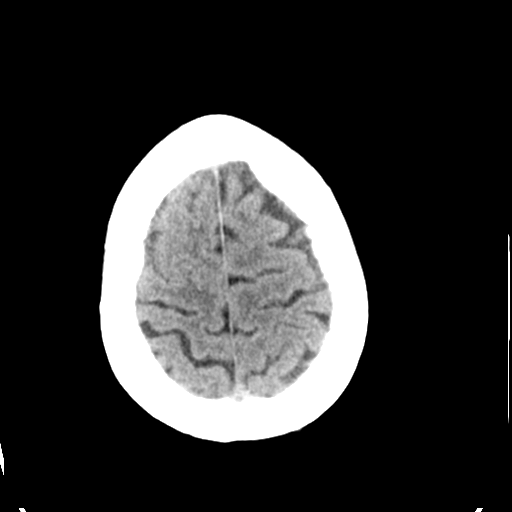
[im 30/33  brain]
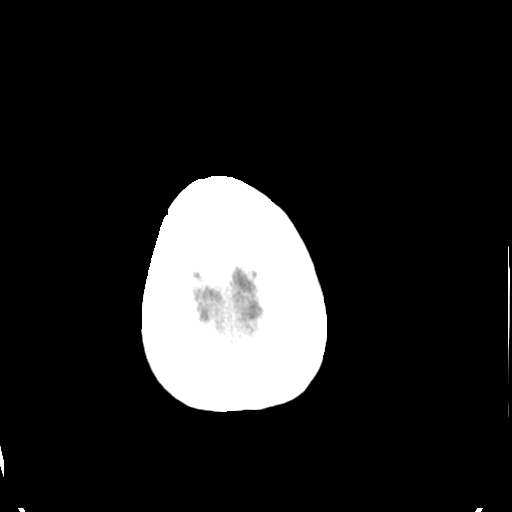
[im 30/33  bone]
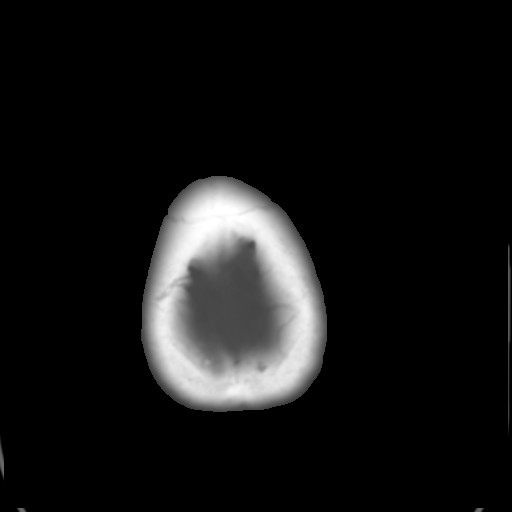

[Series 4: head 3.0 mpr cor · coronal · 0.32mm/px · 3 of 66 slices shown]
[im 22/66  brain]
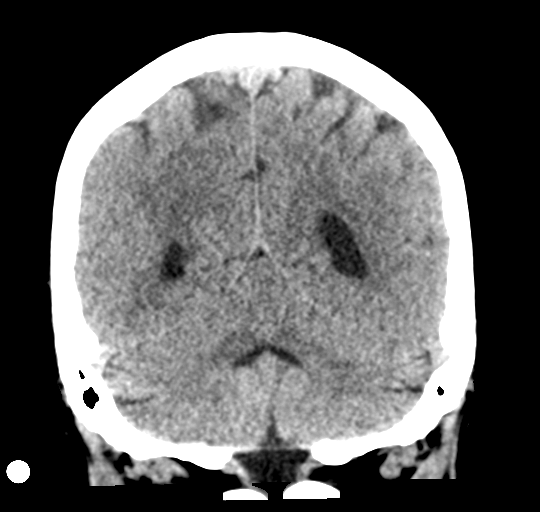
[im 29/66  brain]
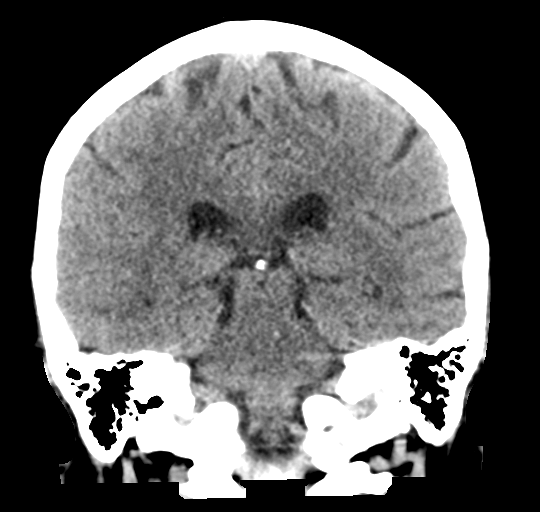
[im 37/66  brain]
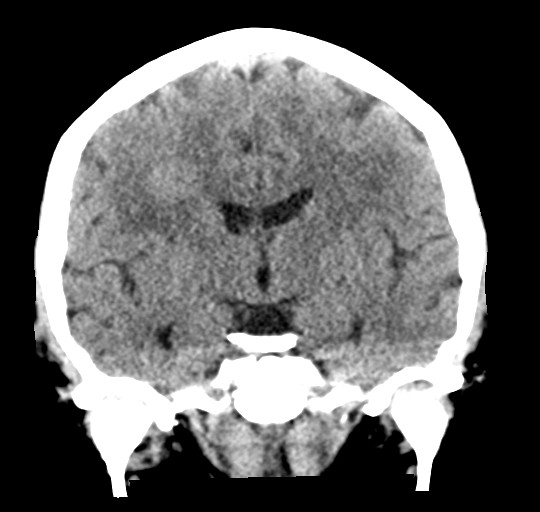

[Series 5: head 3.0 mpr sag · sagittal · 0.32mm/px · 3 of 53 slices shown]
[im 18/53  brain]
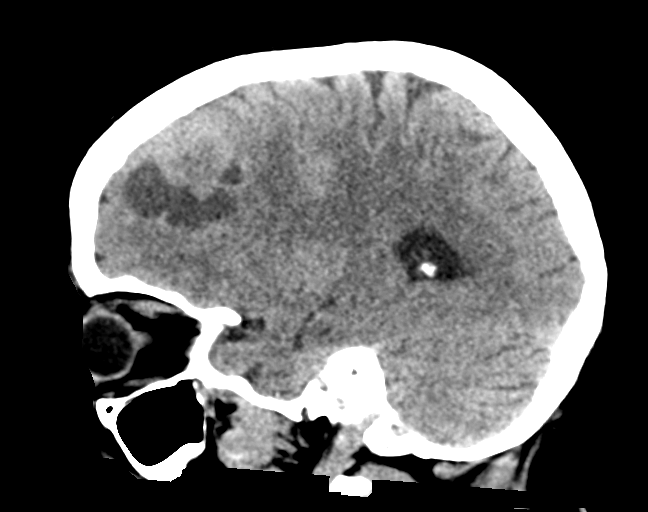
[im 27/53  brain]
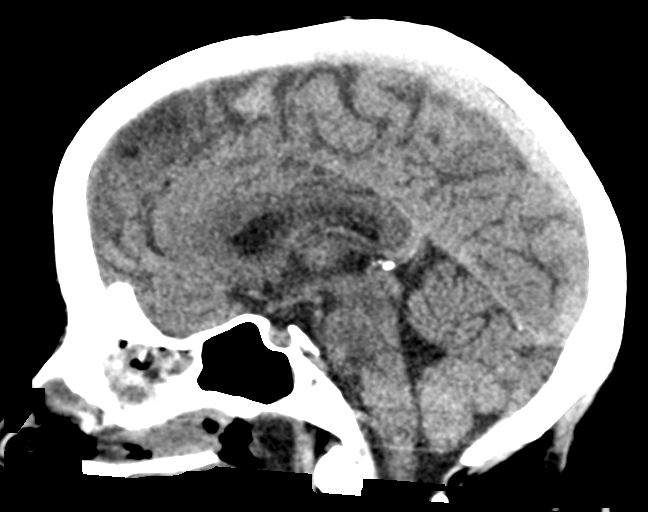
[im 35/53  brain]
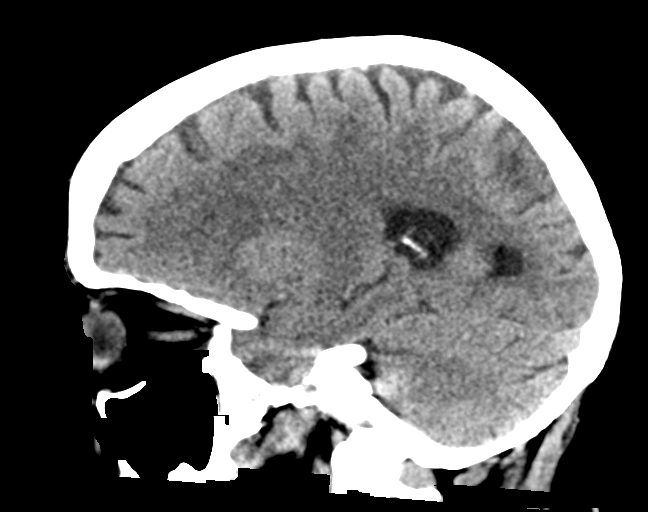

[15 of 47 positions shown; findings below may reference images not displayed]

FINDINGS: Brain: There is a focal region of hypoattenuation within the right
frontal lobe measuring 4.8 x 2.3 cm with multiple internal
septations. There is minimal adjacent edema. There is a focal region
of hyperattenuation more posteriorly within the right frontal white
matter. There is minimal midline shift, measuring approximately 2
mm. No frank herniation. Basal cisterns are clearly patent. No
hydrocephalus. No extra-axial collection.

Vascular: Atherosclerotic calcification of the vertebral arteries at
the skullbase.

Skull: Normal. Negative for fracture or focal lesion.

Sinuses/Orbits: No acute finding.

Other: None.
IMPRESSION: 1. Mass lesion within the right frontal lobe measuring up to 4.8 cm.
The appearance is most suggestive of a primary CNS neoplasm, such as
a high-grade glioma. However, a cerebral abscess may have a similar
appearance. MRI with and without contrast is recommended for further
characterization.
2. No significant mass effect or midline shift.  No hydrocephalus.

## 2018-10-20 IMAGING — CR DG ABD PORTABLE 1V
1 series · 1 of 1 positions shown · non-contrast
Comparison: None.

CLINICAL DATA: Orogastric tube placement.  Initial encounter.

EXAM:
PORTABLE ABDOMEN - 1 VIEW

[AP]
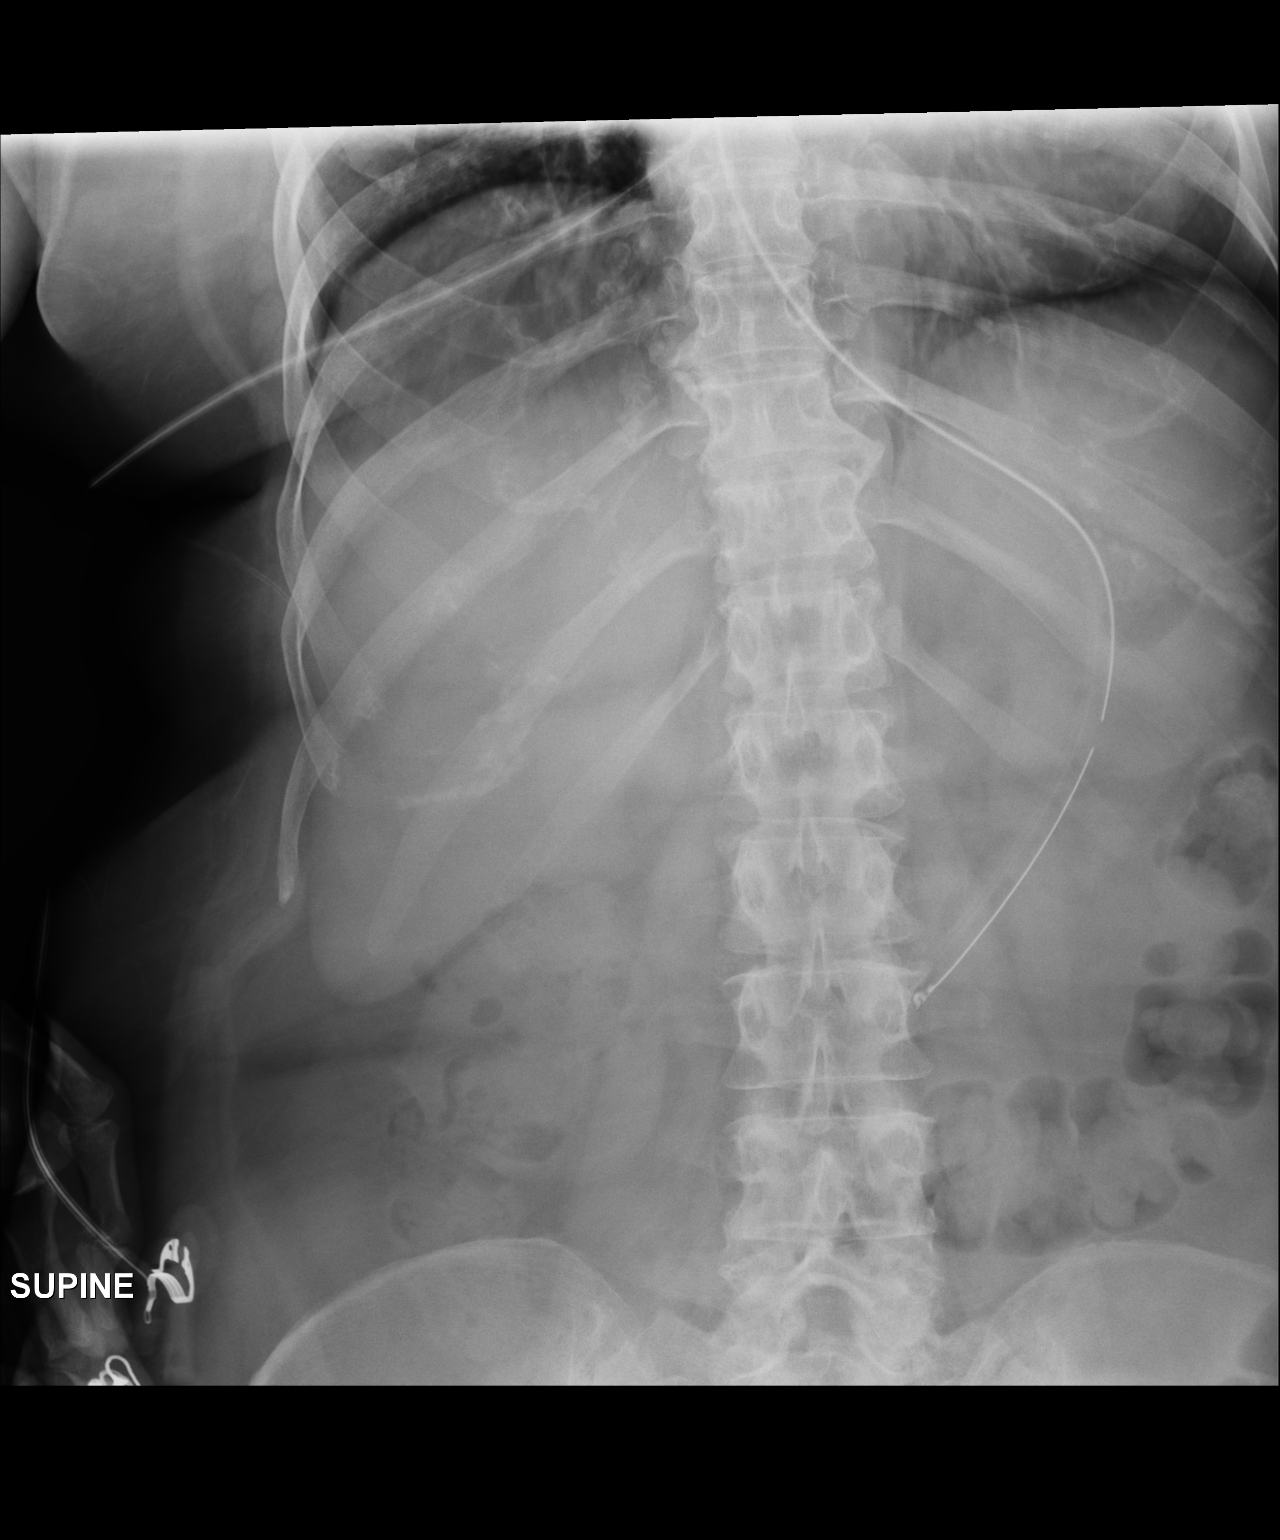

[1 of 1 positions shown; findings below may reference images not displayed]

FINDINGS: The patient's enteric tube is noted ending overlying the body of the
stomach.

The visualized bowel gas pattern is unremarkable. Scattered air and
stool filled loops of colon are seen; no abnormal dilatation of
small bowel loops is seen to suggest small bowel obstruction. No
free intra-abdominal air is identified, though evaluation for free
air is limited on a single supine view.

The visualized osseous structures are within normal limits; the
sacroiliac joints are unremarkable in appearance. The visualized
lung bases are essentially clear.
IMPRESSION: Enteric tube noted ending overlying the body of the stomach.

## 2018-10-20 IMAGING — CR DG ABD PORTABLE 1V
1 series · 1 of 1 positions shown · non-contrast
Comparison: None.

CLINICAL DATA: OG tube placement

EXAM:
PORTABLE ABDOMEN - 1 VIEW

[AP]
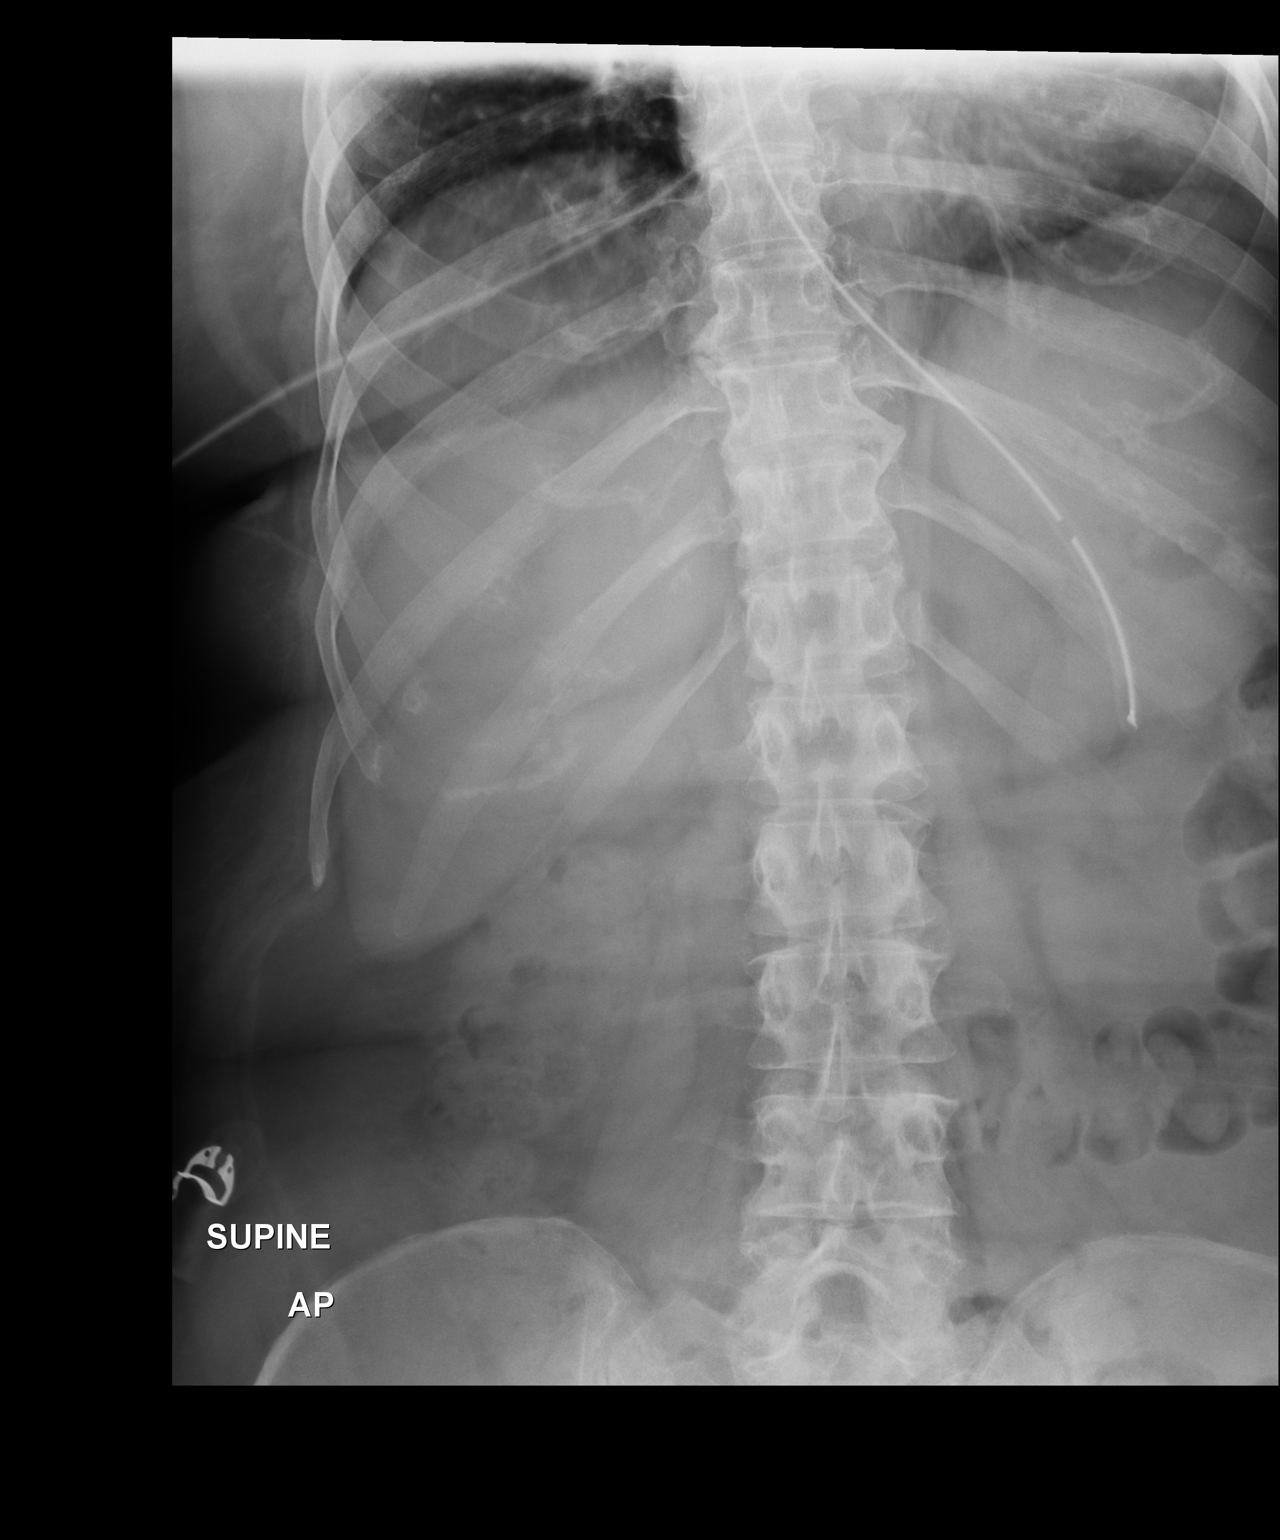

[1 of 1 positions shown; findings below may reference images not displayed]

FINDINGS: Esophageal tube tip projects over the proximal stomach. Upper bowel
gas pattern is nonobstructed.
IMPRESSION: Esophageal tube tip overlies the proximal stomach
